# Patient Record
Sex: Female | Born: 1990
Health system: Southern US, Community
[De-identification: ages and names within clinical notes are randomized; demographics above are authoritative.]

## PROBLEM LIST (undated history)

## (undated) DIAGNOSIS — R569 Unspecified convulsions: Secondary | ICD-10-CM

## (undated) DIAGNOSIS — T7840XA Allergy, unspecified, initial encounter: Secondary | ICD-10-CM

## (undated) DIAGNOSIS — U071 COVID-19: Secondary | ICD-10-CM

## (undated) DIAGNOSIS — J101 Influenza due to other identified influenza virus with other respiratory manifestations: Secondary | ICD-10-CM

## (undated) DIAGNOSIS — J302 Other seasonal allergic rhinitis: Secondary | ICD-10-CM

## (undated) DIAGNOSIS — Z8619 Personal history of other infectious and parasitic diseases: Secondary | ICD-10-CM

## (undated) DIAGNOSIS — D649 Anemia, unspecified: Secondary | ICD-10-CM

## (undated) DIAGNOSIS — K599 Functional intestinal disorder, unspecified: Secondary | ICD-10-CM

## (undated) HISTORY — DX: Anemia, unspecified: D64.9

## (undated) HISTORY — DX: Personal history of other infectious and parasitic diseases: Z86.19

## (undated) HISTORY — DX: Other seasonal allergic rhinitis: J30.2

## (undated) HISTORY — DX: Allergy, unspecified, initial encounter: T78.40XA

## (undated) HISTORY — DX: Unspecified convulsions: R56.9

## (undated) HISTORY — DX: COVID-19: U07.1

## (undated) HISTORY — DX: Functional intestinal disorder, unspecified: K59.9

---
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
## 2010-03-08 ENCOUNTER — Encounter: Payer: Self-pay | Admitting: Internal Medicine

## 2010-04-07 ENCOUNTER — Encounter: Payer: Self-pay | Admitting: Internal Medicine

## 2010-05-09 ENCOUNTER — Encounter: Payer: Self-pay | Admitting: Internal Medicine

## 2010-05-12 ENCOUNTER — Encounter (INDEPENDENT_AMBULATORY_CARE_PROVIDER_SITE_OTHER): Payer: Self-pay | Admitting: *Deleted

## 2010-05-30 ENCOUNTER — Ambulatory Visit: Payer: Self-pay | Admitting: Internal Medicine

## 2010-05-30 DIAGNOSIS — K5909 Other constipation: Secondary | ICD-10-CM

## 2010-05-30 DIAGNOSIS — E559 Vitamin D deficiency, unspecified: Secondary | ICD-10-CM | POA: Insufficient documentation

## 2010-05-30 HISTORY — DX: Other constipation: K59.09

## 2010-06-22 ENCOUNTER — Encounter: Payer: Self-pay | Admitting: Internal Medicine

## 2010-07-04 ENCOUNTER — Telehealth: Payer: Self-pay | Admitting: Internal Medicine

## 2010-07-07 ENCOUNTER — Telehealth: Payer: Self-pay | Admitting: Internal Medicine

## 2010-07-25 ENCOUNTER — Telehealth: Payer: Self-pay | Admitting: Internal Medicine

## 2010-07-27 ENCOUNTER — Telehealth: Payer: Self-pay | Admitting: Internal Medicine

## 2010-08-23 ENCOUNTER — Ambulatory Visit: Payer: Self-pay | Admitting: Internal Medicine

## 2010-08-23 ENCOUNTER — Encounter (INDEPENDENT_AMBULATORY_CARE_PROVIDER_SITE_OTHER): Payer: Self-pay | Admitting: *Deleted

## 2010-08-31 ENCOUNTER — Telehealth: Payer: Self-pay | Admitting: Internal Medicine

## 2010-09-02 ENCOUNTER — Ambulatory Visit: Payer: Self-pay | Admitting: Internal Medicine

## 2010-09-02 ENCOUNTER — Encounter (INDEPENDENT_AMBULATORY_CARE_PROVIDER_SITE_OTHER): Payer: Self-pay | Admitting: *Deleted

## 2010-09-02 LAB — CONVERTED CEMR LAB: Sed Rate: 10 mm/hr (ref 0–22)

## 2010-09-26 ENCOUNTER — Ambulatory Visit: Payer: Self-pay | Admitting: Internal Medicine

## 2010-10-04 ENCOUNTER — Encounter (INDEPENDENT_AMBULATORY_CARE_PROVIDER_SITE_OTHER): Payer: Self-pay | Admitting: *Deleted

## 2010-10-10 ENCOUNTER — Telehealth: Payer: Self-pay | Admitting: Internal Medicine

## 2010-10-18 ENCOUNTER — Telehealth: Payer: Self-pay | Admitting: Internal Medicine

## 2010-10-21 ENCOUNTER — Telehealth: Payer: Self-pay | Admitting: Internal Medicine

## 2010-11-07 ENCOUNTER — Ambulatory Visit: Payer: Self-pay | Admitting: Internal Medicine

## 2010-11-07 DIAGNOSIS — R1110 Vomiting, unspecified: Secondary | ICD-10-CM | POA: Insufficient documentation

## 2010-11-10 ENCOUNTER — Telehealth: Payer: Self-pay | Admitting: Internal Medicine

## 2010-11-11 ENCOUNTER — Ambulatory Visit: Payer: Self-pay | Admitting: Internal Medicine

## 2010-11-16 ENCOUNTER — Ambulatory Visit: Payer: Self-pay | Admitting: Internal Medicine

## 2010-11-16 ENCOUNTER — Telehealth: Payer: Self-pay | Admitting: Internal Medicine

## 2010-11-22 ENCOUNTER — Encounter (INDEPENDENT_AMBULATORY_CARE_PROVIDER_SITE_OTHER): Payer: Self-pay | Admitting: *Deleted

## 2010-11-22 ENCOUNTER — Telehealth (INDEPENDENT_AMBULATORY_CARE_PROVIDER_SITE_OTHER): Payer: Self-pay | Admitting: *Deleted

## 2010-12-02 ENCOUNTER — Telehealth: Payer: Self-pay | Admitting: Internal Medicine

## 2010-12-09 ENCOUNTER — Telehealth: Payer: Self-pay | Admitting: Internal Medicine

## 2011-01-17 ENCOUNTER — Encounter: Payer: Self-pay | Admitting: Internal Medicine

## 2011-01-24 NOTE — Letter (Signed)
Summary: Appt Reminder 2  [facility]  [address] [place], [place] [address]   Phone: [phone]  Fax: [phone]        October 04, 2010 MRN: [id number]    Equilla Harton [address] [place], [place]  [address]    Dear Ms. Rozenberg,   You have a return appointment with Dr. Leone Payor on November 07, 2010 at 2:30pm.  Please remember to bring a complete list of the medicines you are taking, your insurance card and your co-pay.  If you have to cancel or reschedule this appointment, please call before 5:00 pm the evening before to avoid a cancellation fee.  If you have any questions or concerns, please call [phone].    Sincerely,    Francee Piccolo CMA Duncan Dull)  Appended Document: Appt Reminder 2 printed and mailed on 10/04/10

## 2011-01-24 NOTE — Progress Notes (Signed)
Summary: TRIAGE  Phone Note Call from Patient Call back at [phone]   Caller: Patient Call For: Dr. Leone Payor Reason for Call: Talk to Nurse Summary of Call: pt. has been taking the DICYCLOMINE and feels like she has gotten "worse", still in alot of pain Initial call taken by: Karna Christmas,  August 31, 2010 10:29 AM  Follow-up for Phone Call        Pt. states she has been taking Bentyl, now states she is worse, she feels the pain even when not eating, pain is constant now.  She is using Miralax once daily, but no improvement in constipation.  Declines to increase Miralax to two times a day.  DR.GESSNER PLEASE ADVISE   Follow-up by: Laureen Ochs LPN,  August 31, 2010 10:37 AM  Additional Follow-up for Phone Call Additional follow up Details #1::        I would like Amy to see her if possible, this week. Additional Follow-up by: Iva Boop MD, Clementeen Graham,  August 31, 2010 3:47 PM    Additional Follow-up for Phone Call Additional follow up Details #2::    Message left for patient to callback. Laureen Ochs LPN  August 31, 2010 3:56 PM  Pt. scheduled an appt. w/Amy Esterwood PAC on 09-02-10 at 11am. Pt. instructed to call back as needed.  Follow-up by: Laureen Ochs LPN,  August 31, 2010 4:14 PM

## 2011-01-24 NOTE — Assessment & Plan Note (Signed)
Summary: FOLLOW UP COLON//SP   History of Present Illness Visit Type: Follow-up Visit Primary GI MD: Stan Head MD [facility] Primary Provider: Loma Sender, MD Requesting Provider: na Chief Complaint: Dry heaves daily since Colon History of Present Illness:   20 yo with chronic constipation. she began to have dry heaves after colonoscopy - she initially thought it might be related to the Amitiza but stopping it did not help. These are random but do not disturb her sleep. She does seem to have early satiety. She notices bad breath in AM also. She canot associate them with any food or activity trigger. Bowel habits still alternating with no stools and then multiple soft stools. Still on the MiraLax. She says she began to vomt after she had taken milk of magnesia for several weeks straight.   School is going well. Has not been needed at work for past month.   GI Review of Systems    Reports acid reflux, nausea, and  vomiting.      Denies abdominal pain, belching, bloating, chest pain, dysphagia with liquids, dysphagia with solids, heartburn, loss of appetite, vomiting blood, weight loss, and  weight gain.      Reports change in bowel habits.     Denies anal fissure, black tarry stools, constipation, diarrhea, diverticulosis, fecal incontinence, heme positive stool, hemorrhoids, irritable bowel syndrome, jaundice, light color stool, liver problems, rectal bleeding, and  rectal pain.    Current Medications (verified): 1)  Birth Control Pills .... As Directed 2)  Vitamin D-3 5000 Unit Tabs (Cholecalciferol) .... Take One By Mouth Once Daily 3)  Miralax  Powd (Polyethylene Glycol 3350) .... Take One Capful Once A Day 4)  Tylenol Extra Strength 500 Mg Tabs (Acetaminophen) .... Take 1-2 Tablets Every 6-8 Hours As Needed 5)  Dulcolax 5 Mg Tbec (Bisacodyl) .... Take As Directed Every 4-5 Days If No Bowel Movement  Allergies (verified): No Known Drug Allergies  Past History:  Past Medical  History: Reviewed history from 09/02/2010 and no changes required. Chronic constipation HX VIT D DEF  Past Surgical History: Reviewed history from 05/30/2010 and no changes required. foot surgery Wisdom teeth  Family History: Reviewed history from 05/30/2010 and no changes required. Family History of Colon Cancer:PGM Family History of Heart Disease: Father  Social History: Reviewed history from 08/23/2010 and no changes required. Occupation:[employer] part-time [employer] Graduate 2011 [employer] Student at [employer] Patient has never smoked.  Alcohol Use - no Daily Caffeine Use Illicit Drug Use - no  Review of Systems       she has a headache today but no caffeine yet today.  Vital Signs:  Patient profile:   20 year old female Height:      67 inches Weight:      130.50 pounds BMI:     20.51 Pulse rate:   72 / minute Pulse rhythm:   regular BP sitting:   104 / 76  (left arm) Cuff size:   regular  Vitals Entered By: June McMurray CMA Duncan Dull) (November 07, 2010 2:37 PM)  Physical Exam  General:  Well developed, well nourished, no acute distress. Abdomen:  soft and nontender no splash BS+   Impression & Recommendations:  Problem # 1:  CONSTIPATION, CHRONIC (ICD-564.09) ? motility problems await Sitz marks  Problem # 2:  DRY HEAVES (ICD-787.03) Assessment: New cause not clear trial of PPI (Empiric) may need gastric emtying study, EGD could be behavioral though she does not appear anxious  Patient Instructions: 1)  Begin taking Dexilant once daily 30 minutes before breakfast. 2)  Please return on Friday, 11/11/10 for Sitzmarks.  You will need to return on 11/23 for your XRay. 3)  We will call you with further follow up after reviewing these results.  4)  Copy Sent To: Loma Sender, MD 5)  The medication list was reviewed and reconciled.  All changed / newly prescribed medications were explained.  A complete medication list was provided to the patient  / caregiver.

## 2011-01-24 NOTE — Letter (Signed)
Summary: New Patient letter  [facility]  [address] [place], [place] [address]   Phone: [phone]  Fax: [phone]       05/12/2010 MRN: [id number]  Caroline [name] [address] [place], [place]  [address]  Dear Ms. Shinn,  Welcome to the Gastroenterology Division at [facility].    You are scheduled to see Dr.  Stan Head on May 30, 2010 at 8:45am on the 3rd floor at [facility], [address]  We ask that you try to arrive at our office 15 minutes prior to your appointment time to allow for check-in.  We would like you to complete the enclosed self-administered evaluation form prior to your visit and bring it with you on the day of your appointment.  We will review it with you.  Also, please bring a complete list of all your medications or, if you prefer, bring the medication bottles and we will list them.  Please bring your insurance card so that we may make a copy of it.  If your insurance requires a referral to see a specialist, please bring your referral form from your primary care physician.  Co-payments are due at the time of your visit and may be paid by cash, check or credit card.     Your office visit will consist of a consult with your physician (includes a physical exam), any laboratory testing he/she may order, scheduling of any necessary diagnostic testing (e.g. x-ray, ultrasound, CT-scan), and scheduling of a procedure (e.g. Endoscopy, Colonoscopy) if required.  Please allow enough time on your schedule to allow for any/all of these possibilities.    If you cannot keep your appointment, please call [phone] to cancel or reschedule prior to your appointment date.  This allows [place] the opportunity to schedule an appointment for another patient in need of care.  If you do not cancel or reschedule by 5 p.m. the business day prior to your appointment date, you will be charged a $50.00 late cancellation/no-show fee.    Thank you for  choosing [facility] for your medical needs.  We appreciate the opportunity to care for you.  Please visit [place] at our website  to learn more about our practice.                     Sincerely,                                                             The Gastroenterology Division

## 2011-01-24 NOTE — Progress Notes (Signed)
Summary: labwork  Phone Note Call from Patient Call back at [phone]   Caller: Patient Call For: Dr. Leone Payor Reason for Call: Talk to Nurse Summary of Call: wants to know if she needs to redo bloodwork and when Initial call taken by: Vallarie Mare,  July 25, 2010 11:23 AM  Follow-up for Phone Call        no lab work recommended at this time.  Mom reports patient still having problems.  I have asked her to call back to schedule an office visit.  Follow-up by: Darcey Nora RN, CGRN,  July 25, 2010 12:05 PM

## 2011-01-24 NOTE — Progress Notes (Signed)
Summary: med ?  Phone Note Call from Patient Call back at [phone]   Caller: mother, Amil Amen Call For: Dr. Leone Payor Reason for Call: Talk to Nurse Summary of Call: med interaction ? Initial call taken by: Vallarie Mare,  November 10, 2010 2:22 PM  Follow-up for Phone Call        Patient was started on amoxicillin and wanted to know if it will interefere with the Sitzmark study she is doing.  She is advised ok shouldn't cause any problems. Follow-up by: Darcey Nora RN, CGRN,  November 10, 2010 2:33 PM

## 2011-01-24 NOTE — Progress Notes (Signed)
  Phone Note Other Incoming   Request: Send information Summary of Call: Patient's release for all medical records forwarded to [facility].

## 2011-01-24 NOTE — Letter (Signed)
Summary: [facility]  [facility]   Imported By: Sherian Rein 06/03/2010 08:03:13  _____________________________________________________________________  External Attachment:    Type:   Image     Comment:   External Document

## 2011-01-24 NOTE — Assessment & Plan Note (Signed)
Summary: hx constipation....em   History of Present Illness Visit Type: Initial Consult Primary GI MD: Stan Head MD [facility] Primary Provider: Loma Sender, MD Requesting Provider: Loma Sender, MD Chief Complaint: constipation  x 6weeks History of Present Illness:   20 yo woman with 2 year history of defecation problems. Felt like she was going to pass out because she could not defecate. She had a ? of hypoglycemia but told or determined that was a false +. Caroline [name] thought she had some panic issues, vit D was low and supplemented and that helped with sleep. Saw Caroline Plume, MD and was ok on Milk of Magnesia then changed to MiraLAx with success after vomiting with Milk of Magnesia. Then in past several weeks she noticed excessive thirst , a few days between bowel movements instead of daily. she used dulcolax intermittently with success. Gets cramps and urge to defecate but unable to pass. Mom says that she had some rectal bleeding as a child (age 51) but no constipation. She passed out as a fourth-grader, and recalls going home and defecating with relief. She feels very tired after spells of crampy, delayed defecation. Mom is concerned because her paternal grandmother died related to  colon or rectal cancer surgery at 58...no more details.   Also ? if this is worse around menses.   GI Review of Systems    Reports abdominal pain, bloating, nausea, and  vomiting.     Location of  Abdominal pain: lower abdomen.    Denies acid reflux, belching, chest pain, dysphagia with liquids, dysphagia with solids, heartburn, loss of appetite, vomiting blood, weight loss, and  weight gain.      Reports constipation.     Denies anal fissure, black tarry stools, change in bowel habit, diarrhea, diverticulosis, fecal incontinence, heme positive stool, hemorrhoids, irritable bowel syndrome, jaundice, light color stool, liver problems, rectal bleeding, and  rectal pain. Preventive  Screening-Counseling & Management  Alcohol-Tobacco     Smoking Status: never      Drug Use:  no.      Current Medications (verified): 1)  Miralax   Powd (Polyethylene Glycol 3350) .... Take One Dose Once Daily 2)  Birth Control Pills .... As Directed 3)  Vitamin D 50000iu .... 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  Alcohol Use - no Daily Caffeine Use Illicit Drug Use - no Smoking Status:  never Drug Use:  no  Review of Systems       The patient complains of allergy/sinus, fatigue, menstrual pain, and thirst - excessive.         All other ROS negative except as per HPI.   Vital Signs:  Patient profile:   20 year old female Height:      67 inches Weight:      131.25 pounds BMI:     20.63 Pulse rate:   80 / minute Pulse rhythm:   regular BP sitting:   100 / 70  (left arm) Cuff size:   regular  Vitals Entered By: Caroline [name] CMA Caroline [name]) (Caroline  6, 2011 9:11 AM)  Physical Exam  General:  Well developed, well nourished, no acute distress. Eyes:  PERRLA, no icterus. Mouth:  No deformity or lesions, dentition normal. Neck:  Supple; no masses or thyromegaly. Lungs:  Clear throughout to auscultation. Heart:  Regular rate and rhythm; no murmurs, rubs,  or bruits. Abdomen:  Soft, nontender and nondistended. No masses, hepatosplenomegaly or hernias noted. Normal bowel sounds. Rectal:  Caroline [name] CMA present normal anoderm sensation intact normal tone, good voluntary squeeze no abnormal descent Extremities:  No clubbing, cyanosis, edema or deformities noted. Neurologic:  Alert and  oriented x4;  grossly normal neurologically. Cervical Nodes:  No significant  cervical or supraclavicular adenopathy.  Psych:  Alert and cooperative. Normal mood and affect.   Impression & Recommendations:  Problem # 1:  CONSTIPATION (ICD-564.00) Assessment New nothing suspicious but ? sprue  IBS likely sounds like vasovagal issues and not panic attacks re: presyncope sensations while defecating but she may have some anxiety with overall situation Will increase to  two times a day MiraLax and as needed dulcolax may need sitz marker study but not a good time to stop laxatives as preparing to graduate from high school will review Caroline [name] records before any other recommendations, he might have drawn celiac labs  Problem # 2:  VITAMIN D DEFICIENCY (ICD-268.9) Assessment: New The presence of this raises possibility of celiac disease. Await old records review.  Patient Instructions: 1)  We will review Caroline [name] records and call. 2)  Ifyou have not heard from [place] in 2 weeks call back. 3)  It sounds like the near-fainting is related to vasovagal reaction or Valsalva reflex. 4)  Increase your Miralax to two times a day. 5)  You may use Dulcolax every 2-3 days as needed. 6)  Copy sent to : Caroline Marry, MD, Caroline Sender, MD 7)  The medication list was reviewed and reconciled.  All changed / newly prescribed medications were explained.  A complete medication list was provided to the patient / caregiver.  Appended Document: hx constipation....em old records from Dr. Chestine Spore and Dr. Vear Clock' office notes and recent TSH, vit D, Hgb and Hct viewed  Appended Document: hx constipation....em   Appended Document: hx constipation....em I have reviewed Dr. Altheimer's records. She has not been tested for eliac disease we will order TTG Ab and IgA level  Appended Document: hx constipation....em Patient  wants the labs to be drawn at [facility].  I will fax an order Caroline [name], [facility]  Caroline 14, 2011 8:52 AM   Clinical Lists Changes  Orders: Added  new Test order of T-Tissue Transglutamase Ab IgA [id number]) - Signed Added new Test order of TLB-IgA (Immunoglobulin A) ([id number]) - Signed

## 2011-01-24 NOTE — Progress Notes (Signed)
Summary: update request  Phone Note Call from Patient Call back at Home Phone [phone] Call back at [phone]   Caller: Patient Call For: Dr. Leone Payor Reason for Call: Talk to Nurse Summary of Call: would like to know the status of her referral by Dr. Leone Payor to another specialist Initial call taken by: Vallarie Mare,  December 02, 2010 3:24 PM  Follow-up for Phone Call        advised pt that [facility] has her records and has been trying to reach her to schedule.  Per operator at [facility], pt's phone rings without answer.  Scheduling # given to pt and she may call them directly. [phone] option 2 Follow-up by: Francee Piccolo CMA Duncan Dull),  December 02, 2010 3:40 PM

## 2011-01-24 NOTE — Letter (Signed)
Summary: Casimiro Needle Altheimer MD  Casimiro Needle Altheimer MD   Imported By: Sherian Rein 06/08/2010 13:35:21  _____________________________________________________________________  External Attachment:    Type:   Image     Comment:   External Document

## 2011-01-24 NOTE — Assessment & Plan Note (Signed)
Summary: F/U FROM TRIAGE 08-31-10, CONTINUES W/PAIN  (DR.GESSNER PT.) DE...   History of Present Illness Visit Type: Follow-up Visit Primary GI MD: Stan Head MD [facility] Primary Provider: Loma Sender, MD Requesting Provider: na Chief Complaint: Patient still having abdominal pain, worse with Dicyclomine - stopped taking the pill; constipation a little better History of Present Illness:   20 YO FEMALE  KNOWN TO DR. Leone Payor. SHE IS AWAITING COLONOSCOPY WHICH IS SCHEDULED FOR THE FIRST WEEK OF OCTOBER. SHE HAS HAD C/O ABDOMINAL PAIN SINCE HER JUNIOR YEAR IN HIGH SCHOOL WHEN SHE STARTED HAVINGTROUBLE WITH CONSTIPATION AND INTERMITTENT MID ABDOMINAL DISCOMFORT. SHE WAS STARTED ON MIRALAX  BY A PEDIATRIC GASTROENTEROLOGIST AND THIS DID HELP FOR ABOUT A YEAR. NOW OVER THE PAST 3-4 MONTHS HAS HAD DAILY PAIN,CRAMPING. NO BLOATING, VAGUE NAUSEA, NO VOMITING,WEIGHT STABLE OVER ALL.SHE STRUUGLES WITH PAIN,WORSENED BY ETING, AND UNCOMFORTABLE UNTIL SHE CAN HAVE A BM THEN FEELS BETTER FOR A DAY OR TWO. NO BLOOD. DESPITE TAKING MIRIALAX DAILY SHE IS HAVING A BM Q 2-3 DAYS AND NOT EVACUATING WELL.  SHE TAKES DULCOLAX EVERY 5-6 DAYS, HAS SEVERLAL BMS' AND FEELS BETTER. TWICE DAILY MIRALAX GIVES HER LOOSE URGENT STOOL.  SHE FEELS BEST THE WEEK OF HER PERIOD,LESS PAIN, AND REGULAR DAILY BM'S.   GI Review of Systems    Reports abdominal pain, nausea, and  vomiting.     Location of  Abdominal pain: lower abdomen.    Denies acid reflux, belching, bloating, chest pain, dysphagia with liquids, dysphagia with solids, heartburn, loss of appetite, vomiting blood, weight loss, and  weight gain.      Reports constipation and  diarrhea.     Denies anal fissure, black tarry stools, change in bowel habit, diverticulosis, fecal incontinence, heme positive stool, hemorrhoids, irritable bowel syndrome, jaundice, light color stool, liver problems, rectal bleeding, and  rectal pain.    Current Medications (verified): 1)  Birth  Control Pills .... As Directed 2)  Vitamin D-3 5000 Unit Tabs (Cholecalciferol) .... Take One By Mouth Once Daily 3)  Miralax  Powd (Polyethylene Glycol 3350) .... Take One Capful Once A Day  Allergies (verified): No Known Drug Allergies  Past History:  Past Medical History: Chronic constipation HX VIT D DEF  Past Surgical History: Reviewed history from 05/30/2010 and no changes required. foot surgery Wisdom teeth  Family History: Reviewed history from 05/30/2010 and no changes required. Family History of Colon Cancer:PGM Family History of Heart Disease: Father  Social History: Reviewed history from 08/23/2010 and no changes required. 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  Alcohol Use - no Daily Caffeine Use Illicit Drug Use - no  Review of Systems  The patient denies allergy/sinus, anemia, anxiety-new, arthritis/joint pain, back pain, blood in urine, breast changes/lumps, change in vision, confusion, cough, coughing up blood, depression-new, fainting, fatigue, fever, headaches-new, hearing problems, heart murmur, heart rhythm changes, itching, menstrual pain, muscle pains/cramps, night sweats, nosebleeds, pregnancy symptoms, shortness of breath, skin rash, sleeping problems, sore throat, swelling of feet/legs, swollen lymph glands, thirst - excessive , urination - excessive , urination changes/pain, urine leakage, vision changes, and voice change.         SEE HPI  Vital Signs:  Patient profile:   20 year old female Height:      67 inches Weight:      132.25 pounds BMI:     20.79 Pulse rate:   68 / minute Pulse rhythm:   regular BP  sitting:   104 / 68  (left arm) Cuff size:   regular  Vitals Entered By: June McMurray CMA Duncan Dull) (September 02, 2010 10:45 AM)  Physical Exam  General:  Well developed, well nourished, no acute distress.,TALL ,THIN Head:  Normocephalic and atraumatic. Eyes:   PERRLA, no icterus. Lungs:  Clear throughout to auscultation. Heart:  Regular rate and rhythm; no murmurs, rubs,  or bruits. Abdomen:  SOFT, NO FOCAL TENDERNESS, NO MASS OR HSM,BS+ Rectal:  NOT DONE Extremities:  No clubbing, cyanosis, edema or deformities noted. Neurologic:  Alert and  oriented x4;  grossly normal neurologically. Psych:  Alert and cooperative. Normal mood and affect.anxious.  anxious.     Impression & Recommendations:  Problem # 1:  ABDOMINAL PAIN, PERIUMBILICAL (ICD-789.05) Assessment Unchanged 20 YO FEMAL WITH 2 YEAR HX OF MID ABDOMINAL PAIN, CONSTIPATION;PROGRESSIVE SXS PAST 3-4 MONTHS. R/O IBS VS IBD. TTG PREVIOUSLY NEGATIVE.    LONG DISCUSSION REGARDING POSSIBLE IBS/IBD OFFERED TO MOVE COLONOSCOPY TO A SOONER DATE-SHE IS UNABLE DUE TO SCHOOL SCHEDULE CONTINUE MIRALAX 17 GM DAILY IN 8 OZ WATER BENTYL NOT HELPFUL-WILL USE as needed TYLENOL FOR PAIN CONTINUE DULCOLAX Q 5-6 DAYS AS NEEDED. CRP,ESR ADD ALIGN ONE DAILY X 4-6 WEEKS. SHE IS COMFORTABLE WITH THIS PLAN. Orders: TLB-CRP-High Sensitivity (C-Reactive Protein) ([id number]) TLB-Sedimentation Rate (ESR) ([id number])  Problem # 2:  VITAMIN D DEFICIENCY (ICD-268.9) Assessment: Comment Only  Patient Instructions: 1)  Please go to the basement to have your lab tests drawn today.  2)  We will call you with further follow up after reviewing these results. 3)  Begin Dulcolax, Align and Tylenol as prescribed below. 4)  We will see you at your procedure on 09/13/10. 5)  Please call our office before then if needed. 6)  Copy sent to : Loma Sender, MD 7)  The medication list was reviewed and reconciled.  All changed / newly prescribed medications were explained.  A complete medication list was provided to the patient / caregiver.

## 2011-01-24 NOTE — Progress Notes (Signed)
Summary: Sch'd procedure  Phone Note Call from Patient Call back at Home Phone [phone]   Caller: Patient Call For: Dr. Leone Payor Reason for Call: Talk to Nurse Summary of Call: pt. wants to sch'd a capsule endo Initial call taken by: Karna Christmas,  October 21, 2010 1:51 PM  Follow-up for Phone Call        Pt would like to proceed with Sitzmarks study.  She will come on 11/18 to take capsule and return on 11/23 for xray.  Appt's in IDX. Follow-up by: Francee Piccolo CMA Duncan Dull),  October 21, 2010 4:53 PM     Appended Document: Orders Update    Clinical Lists Changes  Orders: Added new Test order of KUB for SITZ Marks (KUB for SITZ) - Signed

## 2011-01-24 NOTE — Letter (Signed)
Summary: Out of [facility]  [address] [place], [place] [address]   Phone: [phone]  Fax: [phone]    September 02, 2010   Student:  Patrena Creekmore    To Whom It May Concern:   For Medical reasons, please excuse the above named student from school for the following dates:  Start:   September 02, 2010  End:    September 02, 2010  If you need additional information, please feel free to contact our office.   Sincerely,    Francee Piccolo CMA [facility])    ****This is a legal document and cannot be tampered with.  Schools are authorized to verify all information and to do so accordingly.

## 2011-01-24 NOTE — Letter (Signed)
Summary: Casimiro Needle Altheimer MD  Casimiro Needle Altheimer MD   Imported By: Sherian Rein 06/08/2010 13:36:44  _____________________________________________________________________  External Attachment:    Type:   Image     Comment:   External Document

## 2011-01-24 NOTE — Procedures (Signed)
Summary: Colonoscopy  Patient: Caroline [name] Note: All result statuses are Final unless otherwise noted.  Tests: (1) Colonoscopy (COL)   COL Colonoscopy           DONE     [facility]     [address]     [place], [place]  [address]           COLONOSCOPY PROCEDURE REPORT           PATIENT:  [name], [name]  MR#:  [id number]     BIRTHDATE:  1991/09/30, 18 yrs. old  GENDER:  female     ENDOSCOPIST:  Iva Boop, MD, [facility]           PROCEDURE DATE:  09/26/2010     PROCEDURE:  Colonoscopy 19147     ASA CLASS:  Class I     INDICATIONS:  Abdominal pain, constipation periumbilical abdominal     pain and other sites, with ? RLQ fullness on one exam, colonoscopy     to evaluate this and look for possible Crohn's disease     MEDICATIONS:   Fentanyl 75 mcg IV, Versed 9 mg IV           DESCRIPTION OF PROCEDURE:   After the risks benefits and     alternatives of the procedure were thoroughly explained, informed     consent was obtained.  Digital rectal exam was performed and     revealed no abnormalities.   The  endoscope was introduced through     the anus and advanced to the cecum, which was identified by both     the appendix and ileocecal valve, without limitations.  The     quality of the prep was excellent, using MoviPrep.  The instrument     was then slowly withdrawn as the colon was fully examined.     Insertion: 3:34 minutes Withdrawal: 9:11 minutes     <<PROCEDUREIMAGES>>           FINDINGS:  The terminal ileum appeared normal.  A normal appearing     cecum, ileocecal valve, and appendiceal orifice were identified.     The ascending, hepatic flexure, transverse, splenic flexure,     descending, sigmoid colon, and rectum appeared unremarkable.     Retroflexed views in the rectum revealed no abnormalities.    The     scope was then withdrawn from the patient and the procedure     completed.           COMPLICATIONS:  None     ENDOSCOPIC IMPRESSION:     1) Normal  terminal ileum     2) Normal colon           All available data and findings to date support a diagnosis of     Irritable Bowel Syndrome.     RECOMMENDATIONS:     1) Continue MiraLax qd     2) Start Amitiza 8 mcg 2 times a day, take with food. If this     works, let us know and will prescribe. If too many bowel movements     on both MiraLax and Amitiza then stop the MiraLax but continue     Amitiza.     3) Patient to call Dr. Marvell Fuller office (this week) to arrange     an appointment for early November           Iva Boop, MD, [facility]  CC:  Loma Sender, MD     The Patient           n.     eSIGNED:   Iva Boop at 09/26/2010 03:02 PM           [name], [id number]  Note: An exclamation mark (!) indicates a result that was not dispersed into the flowsheet. Document Creation Date: 09/26/2010 3:02 PM _______________________________________________________________________  (1) Order result status: Final Collection or observation date-time: 09/26/2010 14:47 Requested date-time:  Receipt date-time:  Reported date-time:  Referring Physician:   Ordering Physician: Stan Head [phone]) Specimen Source:  Source: Launa Grill Order Number: [id number] Lab site:

## 2011-01-24 NOTE — Progress Notes (Signed)
Summary: Did we receive labwork yet?  Phone Note Call from Patient Call back at [phone]   Call For: Dr Leone Payor Summary of Call: Wonders if we received labwork yet? Initial call taken by: Leanor Kail [facility],  July 07, 2010 10:13 AM  Follow-up for Phone Call        lab work is here, I have left her a message. Follow-up by: Darcey Nora RN, CGRN,  July 07, 2010 10:20 AM

## 2011-01-24 NOTE — Letter (Signed)
Summary: Casimiro Needle Altheimer MD  Casimiro Needle Altheimer MD   Imported By: Sherian Rein 06/08/2010 13:37:41  _____________________________________________________________________  External Attachment:    Type:   Image     Comment:   External Document

## 2011-01-24 NOTE — Progress Notes (Signed)
Summary: Wants to stop Althia Forts  Phone Note Call from Patient Call back at [employer] [phone]   Call For: DR Leone Payor Summary of Call: Amitiza- wants to stop taking medicine Initial call taken by: Leanor Kail [facility],  October 18, 2010 12:42 PM  Follow-up for Phone Call        Patient has nausea and dry heaves with Amitiza and she doesn't feel it has made any difference.  Even with her insurance is very expensive.  She is asking for an alternative. Advised not many other options.  She is asked to continue Miralax until Dr Leone Payor has a chance to review. Follow-up by: Darcey Nora RN, CGRN,  October 18, 2010 1:28 PM  Additional Follow-up for Phone Call Additional follow up Details #1::        ok, I agree MiraLax two times a day  dulcolax 1-2 by mouth every 2-3 days as needed to promote a bowel movement if has not had one when she can take a break from all laxatives for 5 days i want her to do a sitz marks study Additional Follow-up by: Iva Boop MD, [facility],  October 18, 2010 2:11 PM    Additional Follow-up for Phone Call Additional follow up Details #2::    phone is busy I will continue to try and reach the patient  Darcey Nora RN, [facility]  October 18, 2010 2:37 PM  phone is still busy I will continue to try and reach the patient  Darcey Nora RN, [facility]  October 18, 2010 4:00 PM  patient notified of Dr Marvell Fuller recommendations, she will call back when she is ready to do SitzMark study. Follow-up by: Darcey Nora RN, CGRN,  October 18, 2010 4:16 PM

## 2011-01-24 NOTE — Progress Notes (Signed)
Summary: Triage  Phone Note Call from Patient Call back at Home Phone [phone]   Caller: Patient Call For: Dr. Leone Payor Reason for Call: Talk to Nurse Summary of Call: pt. is still having problems...wants to sch'd an ultrasound Initial call taken by: Karna Christmas,  July 27, 2010 1:21 PM  Follow-up for Phone Call        patient is scheduled to see Dr Leone Payor for 08/23/10 3:30 Follow-up by: Darcey Nora RN, CGRN,  July 27, 2010 1:50 PM

## 2011-01-24 NOTE — Letter (Signed)
Summary: Out of School-11/22/10  [facility]  [address] [place], [place] [address]   Phone: [phone]  Fax: [phone]    November 22, 2010   Student:    Caroline [name] DOB:    [birth year]   To Whom It May Concern:   For Medical reasons, please excuse the above named student from school for the following dates:  Start:   November 22, 2010  End:    November 22, 2010  If you need additional information, please feel free to contact our office.   Sincerely,    Francee Piccolo CMA [facility])    ****This is a legal document and cannot be tampered with.  Schools are authorized to verify all information and to do so accordingly.

## 2011-01-24 NOTE — Letter (Signed)
Summary: [facility]  [facility]   Imported By: Sherian Rein 06/03/2010 07:58:49  _____________________________________________________________________  External Attachment:    Type:   Image     Comment:   External Document

## 2011-01-24 NOTE — Progress Notes (Signed)
Summary: TTG ab neg  Phone Note Outgoing Call   Summary of Call: let her know the tTG Ab is neg for celiac disease  how is she? Iva Boop MD, [facility]  July 07, 2010 8:19 PM   Follow-up for Phone Call        Patient  denies any GI complaint.  "I'm doing pretty good" Follow-up by: Darcey Nora RN, CGRN,  July 08, 2010 10:13 AM  Additional Follow-up for Phone Call Additional follow up Details #1::        okthen continue with current therapy and see me as needed

## 2011-01-24 NOTE — Progress Notes (Signed)
Summary: Constipation/Nausea  Phone Note Call from Patient Call back at Home Phone [phone]   Reason for Call: Talk to Nurse Summary of Call: Pt states she has not noticed any difference while on Amitiza.  She continues Miralax also.  Pt staets she had a scant BM yesterday and nothing today.  Abdomen feels full.  She is now having nausea with dry heaves, but no vomiting.  Last dose of Miralax was last night.  Pt wants to know if there is anything else for constipation.     Initial call taken by: Francee Piccolo CMA [facility]),  October 10, 2010 10:49 AM  Follow-up for Phone Call        need to go to higher dose Amitiza 24 micrograms two times a day with food (Rx written), also use MiraLax REV 4-6 weeks approx may call back after 2-3 weeks if no better Follow-up by: Iva Boop MD, [facility],  October 10, 2010 2:36 PM  Additional Follow-up for Phone Call Additional follow up Details #1::        Pt is notified of the above.  She is agreeable.  She will keep appt on 11/14.  Amitiza sent to [facility]. [place] [employer]. Additional Follow-up by: Francee Piccolo CMA [facility]),  October 10, 2010 3:48 PM    New/Updated Medications: AMITIZA 24 MCG  CAPS (LUBIPROSTONE) 1 two times a day/take with food and water Prescriptions: AMITIZA 24 MCG  CAPS (LUBIPROSTONE) 1 two times a day/take with food and water  #60 x 1   Entered by:   Francee Piccolo CMA (AAMA)   Authorized by:   Iva Boop MD, [facility]   Signed by:   Francee Piccolo CMA (AAMA) on 10/10/2010   Method used:   Faxed to ...       [facility] (retail)       [address]       [place], [place]    [place]       Ph: [phone]       Fax: [phone]   RxID:   [id number] AMITIZA 24 MCG  CAPS (LUBIPROSTONE) 1 two times a day/take with food and water  #60 x 1   Entered and Authorized by:   Iva Boop MD, [facility]   Signed by:   Iva Boop MD, FACG on 10/10/2010   Method used:   Electronically to   [facility] [address] (retail)       [address]       [place], [place]  [address]       Ph: [phone]       Fax: [phone]   RxID:   [id number]

## 2011-01-24 NOTE — Letter (Signed)
Summary: [facility] Instructions  [facility]  [address] [place], [place] [address]   Phone: [phone]  Fax: [phone]       Caroline [name]    11/23/1991    MRN: [id number]      Procedure Day Dorna Bloom: Jake Shark, 09/13/10     Arrival Time: 3:00 PM      Procedure Time: 4:00 PM    Location of Procedure:                    _X_  [facility] (4th Floor)  PREPARATION FOR COLONOSCOPY WITH MOVIPREP   Starting 5 days prior to your procedure 09/08/10 do not eat nuts, seeds, popcorn, corn, beans, peas,  salads, or any raw vegetables.  Do not take any fiber supplements (e.g. Metamucil, Citrucel, and Benefiber).  THE DAY BEFORE YOUR PROCEDURE         MONDAY, 09/12/10  1.  Drink clear liquids the entire day-NO SOLID FOOD  2.  Do not drink anything colored red or purple.  Avoid juices with pulp.  No orange juice.  3.  Drink at least 64 oz. (8 glasses) of fluid/clear liquids during the day to prevent dehydration and help the prep work efficiently.  CLEAR LIQUIDS INCLUDE: Water Jello Ice Popsicles Tea (sugar ok, no milk/cream) Powdered fruit flavored drinks Coffee (sugar ok, no milk/cream) Gatorade Juice: apple, white grape, white cranberry  Lemonade Clear bullion, consomm, broth Carbonated beverages (any kind) Strained chicken noodle soup Hard Candy                           4.  In the morning, mix first dose of MoviPrep solution:    Empty 1 Pouch A and 1 Pouch B into the disposable container    Add lukewarm drinking water to the top line of the container. Mix to dissolve    Refrigerate (mixed solution should be used within 24 hrs)  5.  Begin drinking the prep at 5:00 p.m. The MoviPrep container is divided by 4 marks.   Every 15 minutes drink the solution down to the next mark (approximately 8 oz) until the full liter is complete.   6.  Follow completed prep with 16 oz of clear liquid of your choice (Nothing red or purple).  Continue to drink clear  liquids until bedtime.  7.  Before going to bed, mix second dose of MoviPrep solution:    Empty 1 Pouch A and 1 Pouch B into the disposable container    Add lukewarm drinking water to the top line of the container. Mix to dissolve    Refrigerate  THE DAY OF YOUR PROCEDURE      TUESDAY, 09/13/10  Beginning at 11:OO a.m. (5 hours before procedure):         1. Every 15 minutes, drink the solution down to the next mark (approx 8 oz) until the full liter is complete.  2. Follow completed prep with 16 oz. of clear liquid of your choice.    3. You may drink clear liquids until 2:00 PM (2 HOURS BEFORE PROCEDURE).  MEDICATION INSTRUCTIONS  Unless otherwise instructed, you should take regular prescription medications with a small sip of water   as early as possible the morning of your procedure.       OTHER INSTRUCTIONS  You will need a responsible adult at least 20 years of age to accompany you and drive you home.   This  person must remain in the waiting room during your procedure.  Wear loose fitting clothing that is easily removed.  Leave jewelry and other valuables at home.  However, you may wish to bring a book to read or  an iPod/MP3 player to listen to music as you wait for your procedure to start.  Remove all body piercing jewelry and leave at home.  Total time from sign-in until discharge is approximately 2-3 hours.  You should go home directly after your procedure and rest.  You can resume normal activities the  day after your procedure.  The day of your procedure you should not:   Drive   Make legal decisions   Operate machinery   Drink alcohol   Return to work  You will receive specific instructions about eating, activities and medications before you leave.    The above instructions have been reviewed and explained to me by   _______________________    I fully understand and can verbalize these instructions _____________________________ Date  _________

## 2011-01-24 NOTE — Assessment & Plan Note (Signed)
Summary: follow up constipation/sheri   History of Present Illness Visit Type: Follow-up Visit Primary GI MD: Stan Head MD [facility] Primary Provider: Loma Sender, MD Requesting Provider: na Chief Complaint: Follow up constipation History of Present Illness:   Patient complains her constipation has been worse since her last office visit. She is taking her Miralax once a day since twice a day was too much. She complains when she eats her abdominal pain gets worse. Her abdominal pain is not allowing her tpo sleep at night.Her abdominal generalized abdominal pain, she is worried about her family history of colon cancer. If she is to continue Miralax she would like an rx so she can get it either local or mail order.   When she eats she gets abdomnal pain in the periubilical area. This began first week of August.  She used dulcolax to "clean out" and she had pst-prandial cramps for a week and was miserable. 17 bowel movents in 2 dyas.MiraLax promotes defecation with loose stools. Defecation is incomplete. She vomited last week aso.   GI Review of Systems    Reports abdominal pain.     Location of  Abdominal pain: generalized.    Denies acid reflux, belching, bloating, chest pain, dysphagia with liquids, dysphagia with solids, heartburn, loss of appetite, nausea, vomiting, vomiting blood, weight loss, and  weight gain.      Reports constipation.     Denies anal fissure, black tarry stools, change in bowel habit, diarrhea, diverticulosis, fecal incontinence, heme positive stool, hemorrhoids, irritable bowel syndrome, jaundice, light color stool, liver problems, rectal bleeding, and  rectal pain.    Current Medications (verified): 1)  Birth Control Pills .... As Directed 2)  Vitamin D-3 5000 Unit Tabs (Cholecalciferol) .... Take One By Mouth Once Daily 3)  Miralax  Powd (Polyethylene Glycol 3350) .... Take One Capful Once A Day  Allergies (verified): No Known Drug Allergies  Past  History:  Past Medical History: Reviewed history from 05/27/2010 and no changes required. Chronic constipation  Past Surgical History: Reviewed history from 05/30/2010 and no changes required. foot surgery Wisdom teeth  Family History: Reviewed history from 05/30/2010 and no changes required. 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  Alcohol Use - no Daily Caffeine Use Illicit Drug Use - no  Review of Systems       no fevers  Vital Signs:  Patient profile:   20 year old female Height:      67 inches Weight:      132.2 pounds BMI:     20.78 Pulse rate:   80 / minute Pulse rhythm:   regular BP sitting:   108 / 66  (left arm) Cuff size:   regular  Vitals Entered By: Harlow Mares CMA [facility]) (August 23, 2010 3:10 PM)  Physical Exam  General:  Well developed, well nourished, no acute distress. Eyes:  anicteric Abdomen:  Soft, nontender and nondistended. No masses, hepatosplenomegaly or hernias noted. Normal bowel sounds.   Impression & Recommendations:  Problem # 1:  ABDOMINAL PAIN, PERIUMBILICAL (ICD-789.05) Assessment New She is quite distressed about this pain on either side of the umbilicus. To date, objective data ok. ? if she has atypical Crohn's There is a component of anxiety as symptoms are causing distress and difficulty with classes as she begins college. Add dicyclomine. "Another medicine" was response to that idea and  I explained that she is c/o pain so trying to relieve it. Alot of sighing during visit. Orders: Colonoscopy (Colon)  Problem # 2:  ABDOMINAL MASS, RIGHT LOWER QUADRANT (ICD-789.33) fullness in RLQ raises ? of Crohn's await colonoscopy could need other imaging reassess abd exam after colon prep  Problem # 3:  CONSTIPATION (ICD-564.00) Assessment: Deteriorated Hard  for me to tell from her as to how bad it is now. Getting loose stools with MiraLax so moving bowels. Significant incomplete defecation. IBS likely but given overall situation will exclude (or include) other problems with colonoscopy  Orders: Colonoscopy (Colon)  Patient Instructions: 1)  Please pick up your medications at your pharmacy.  2)  We will see you at your procedure on 09/13/10. 3)  [facility] Patient Information Guide given to patient.  4)  Colonoscopy and Flexible Sigmoidoscopy brochure given.  5)  Copy sent to : Loma Sender, MD 6)  The medication list was reviewed and reconciled.  All changed / newly prescribed medications were explained.  A complete medication list was provided to the patient / caregiver. Prescriptions: MOVIPREP 100 GM  SOLR (PEG-KCL-NACL-NASULF-NA ASC-C) As per prep instructions.  #1 x 0   Entered by:   Francee Piccolo CMA (AAMA)   Authorized by:   Iva Boop MD, [facility]   Signed by:   Francee Piccolo CMA (AAMA) on 08/23/2010   Method used:   Electronically to        [facility] [address] [phone]* (retail)       [address] [place], [place]  [address]       Ph: [phone]       Fax: [phone]   RxID:   [id number] DICYCLOMINE HCL 10 MG CAPS (DICYCLOMINE HCL) 1-2 by mouth before meals as needed to prevent abdominal cramps  #90 x 0   Entered and Authorized by:   Iva Boop MD, [facility]   Signed by:   Iva Boop MD, [facility] on 08/23/2010   Method used:   Electronically to        [facility] [address] [phone]* (retail)       [address] [place]  [address]       Ph: [phone]       Fax: [phone]   RxID:   [id number]

## 2011-01-24 NOTE — Letter (Signed)
Summary: [facility]  [facility]   Imported By: Sherian Rein 06/03/2010 08:00:04  _____________________________________________________________________  External Attachment:    Type:   Image     Comment:   External Document

## 2011-01-24 NOTE — Progress Notes (Signed)
Summary: sitz marks study ok  Phone Note Outgoing Call   Summary of Call: Let her know sitzmarks have all passed through colon this telss me that colon motility is ok IBS is dx resume MiraLax but take twice a day to see if that promotes more regular defecation are dry heaves better? Iva Boop MD, [facility]  November 16, 2010 5:22 PM   Follow-up for Phone Call        Advised pt that all sitzmarks passed and that motility is OK.  Advised pt that dx is IBS.  Pt is very angry that IBS is diagnosis and states in a whisper that I could hear to someone in the room with her that "he says I have IBS, but I know that is not right." Dalilah states that her dry heaving stopped while she was off the Miralax, but dry heaving started again when she restarted Miralax at one dose daily last Wednesday. Pt states she is dry heaving and in pain.  I advised pt to go ahead and take both Miralax doses today and I would call her with your recommendations on Tuesday.  Pt voices understanding and is agreeable. Follow-up by: Francee Piccolo CMA [facility]),  November 21, 2010 4:46 PM  Additional Follow-up for Phone Call Additional follow up Details #1::        we can refer her for another opion at [facility] if she likes. It is possible that there is more testing to do and I am out of suggestions at this time. Additional Follow-up by: Iva Boop MD, [facility],  November 22, 2010 6:45 AM    Additional Follow-up for Phone Call Additional follow up Details #2::    I notified pt of above and she is agreeable to referral to [facility].  Pt also requested a note for school today as she had one of her episodes again this AM and missed her Chemistry exam.  School note printed and left at front desk. Dr. Leone Payor, who do you prefer this pt to see at [facility]? Francee Piccolo CMA [facility])  November 22, 2010 1:51 PM  Dr. Dorita Fray to evaluate chronic constipation and suspected IBS Iva Boop MD, [facility]  November 22, 2010  4:38 PM  Referral form completed and notes faxed to [facility].  Follow-up by: Francee Piccolo CMA [facility]),  November 23, 2010 9:35 AM

## 2011-01-24 NOTE — Progress Notes (Signed)
Summary: Lab results  Phone Note Call from Patient Call back at [facility] [phone]   Caller: Patient Call For: Dr. Leone Payor Reason for Call: Talk to Doctor Summary of Call: Would like Lab results Initial call taken by: Karna Christmas,  July 04, 2010 3:49 PM  Follow-up for Phone Call        Patient  advised that we have no lab results. Follow-up by: Darcey Nora RN, CGRN,  July 04, 2010 4:13 PM

## 2011-01-26 NOTE — Progress Notes (Signed)
Summary: Triage  Phone Note Call from Patient Call back at Home Phone [phone] Call back at [phone]   Caller: Patient Call For: Dr. Leone Payor Reason for Call: Talk to Nurse Summary of Call: Pt is having problems for the past two days, bad taste in her mouth that nothing covers up Initial call taken by: [place] Johnson,  December 09, 2010 11:24 AM  Follow-up for Phone Call        Phoned patient who stated she's  had a foul taste in her mouth since Sunday; frequent oral care has not helped. Patient also reported 6 stools on Tuesday and numbers 4-6 stained the toilet tissue w/ blood; patient denies any blood since Tuesday. Patient also reported abdominal pain when she eats and she did not start Dexilant asking if that was the " drug that causes strokes? ". Patient reports her appointment in [facility] is not until 01/17/11 because she couldn't make the 01/03/11 appointment d/t school. Patient wanted to know if an antibiotic would help and was told no. Instructed patient to try an OTC PPI like Prilosec and keep her appointment at [facility]. I informed her i will send this note to Dr Leone Payor and if he has anything to offer, I will call her back. Follow-up by: Graciella Freer RN,  December 09, 2010 2:20 PM  Additional Follow-up for Phone Call Additional follow up Details #1::        Lisia has not responded to or failed to [facility] with multiple treatment regimens. I do not have any other recommendations at this time as I think she is having IBS problems with anorectal bleeding. I await the [facility] opinion at this time. and agree with above. Additional Follow-up by: Iva Boop MD, Clementeen Graham,  December 09, 2010 2:23 PM

## 2011-02-21 NOTE — Consult Note (Signed)
Summary: [facility]   Imported By: Lennie Odor 02/14/2011 12:17:31  _____________________________________________________________________  External Attachment:    Type:   Image     Comment:   External Document

## 2011-03-31 LAB — URINALYSIS, ROUTINE W REFLEX MICROSCOPIC
Bilirubin Urine: NEGATIVE
Glucose, UA: NEGATIVE mg/dL
Hgb urine dipstick: NEGATIVE
Ketones, ur: NEGATIVE mg/dL
Protein, ur: NEGATIVE mg/dL
Urobilinogen, UA: 0.2 mg/dL (ref 0.0–1.0)

## 2011-03-31 LAB — URINE CULTURE: Colony Count: >=100000

## 2012-07-07 IMAGING — CR DG ABDOMEN 1V
1 series · 1 of 1 positions shown · non-contrast
Comparison: 09/03/2009

CLINICAL DATA: KUB for sitz marker

ABDOMEN - 1 VIEW

[view not recorded]
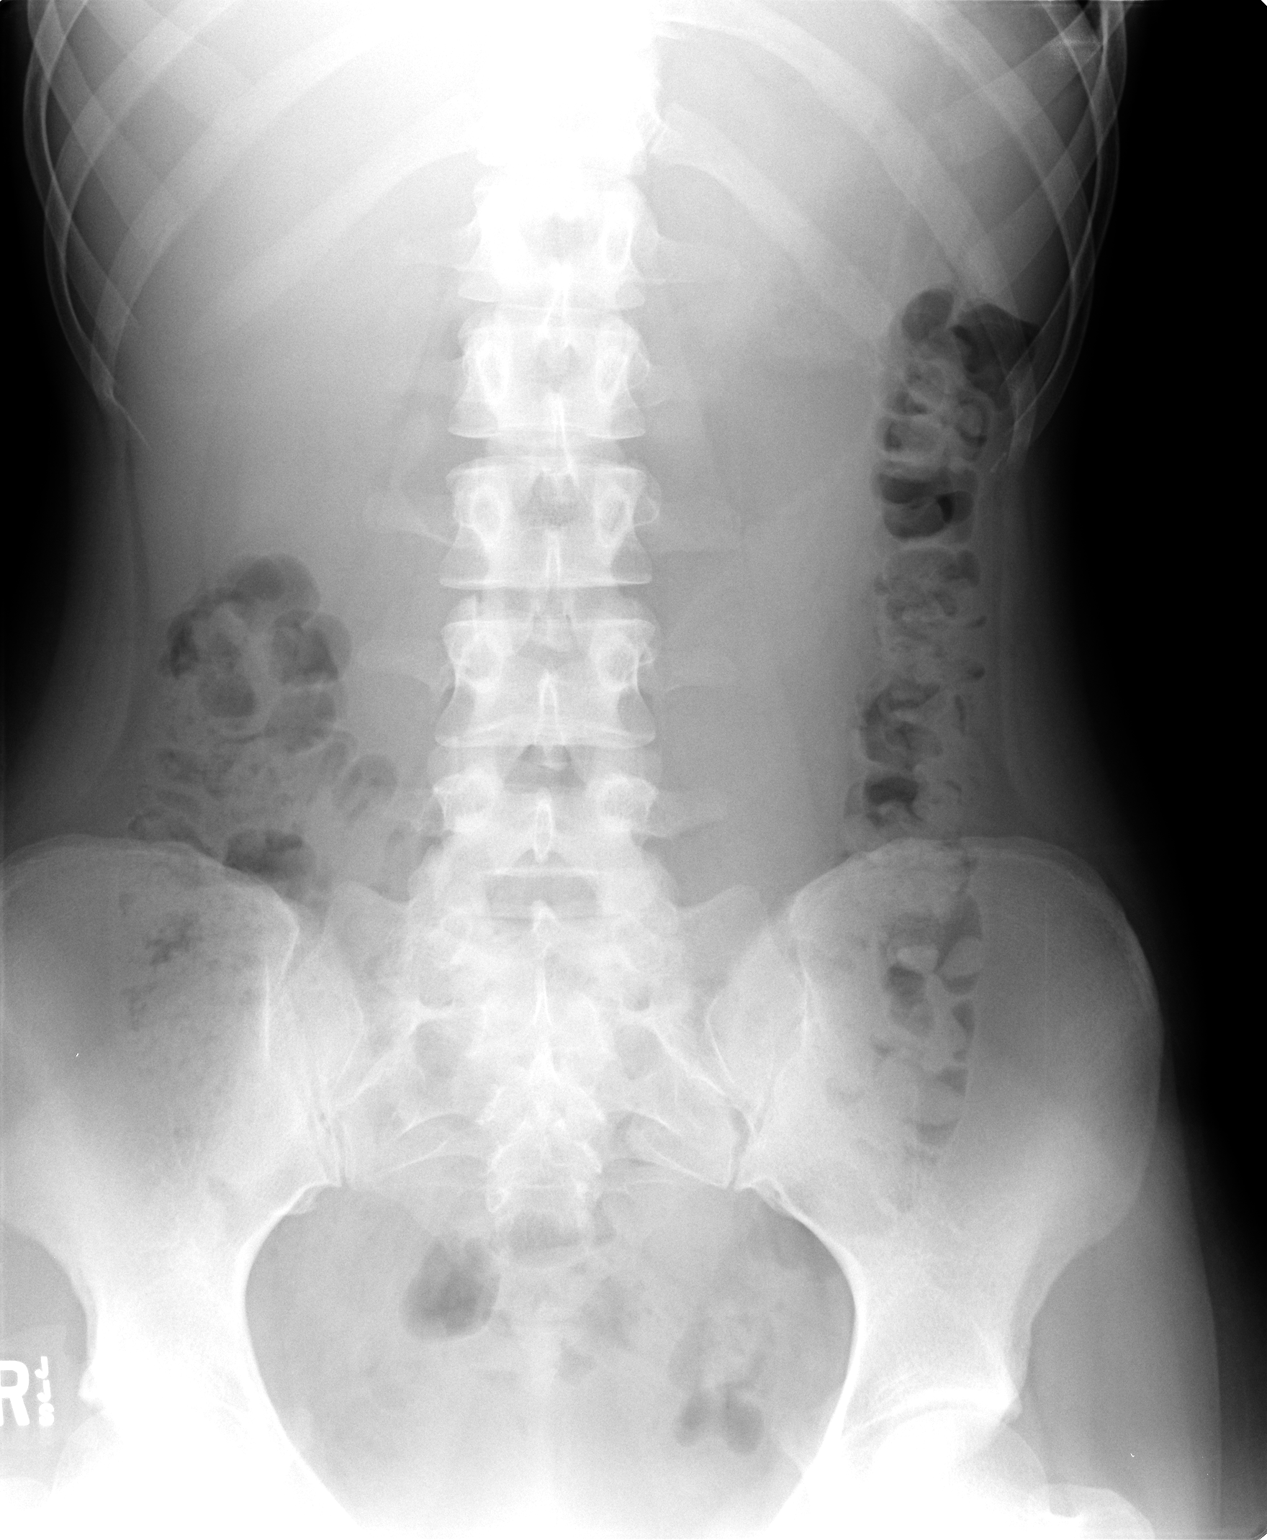

[1 of 1 positions shown; findings below may reference images not displayed]

FINDINGS: Sitz markers have all past.  Moderate stool throughout
the colon.  No disproportionate dilatation of bowel.
IMPRESSION: Nonobstructive bowel gas pattern.

Moderate stool.

All the sitz markers have passed.

## 2014-08-13 ENCOUNTER — Encounter (HOSPITAL_COMMUNITY): Payer: Self-pay | Admitting: Emergency Medicine

## 2014-08-13 ENCOUNTER — Emergency Department (HOSPITAL_COMMUNITY)
Admission: EM | Admit: 2014-08-13 | Discharge: 2014-08-13 | Disposition: A | Discharge status: Home / Self Care | Payer: BC Managed Care – PPO | Attending: Emergency Medicine | Admitting: Emergency Medicine

## 2014-08-13 DIAGNOSIS — Z79899 Other long term (current) drug therapy: Secondary | ICD-10-CM | POA: Diagnosis not present

## 2014-08-13 DIAGNOSIS — R21 Rash and other nonspecific skin eruption: Secondary | ICD-10-CM | POA: Insufficient documentation

## 2014-08-13 DIAGNOSIS — L509 Urticaria, unspecified: Secondary | ICD-10-CM

## 2014-08-13 DIAGNOSIS — K08109 Complete loss of teeth, unspecified cause, unspecified class: Secondary | ICD-10-CM | POA: Insufficient documentation

## 2014-08-13 DIAGNOSIS — R110 Nausea: Secondary | ICD-10-CM | POA: Insufficient documentation

## 2014-08-13 MED ORDER — DIPHENHYDRAMINE HCL 50 MG/ML IJ SOLN
25.0000 mg | Freq: Once | INTRAMUSCULAR | Status: AC
  Administered 2014-08-13: 25 mg via INTRAVENOUS
  Filled 2014-08-13: qty 1

## 2014-08-13 MED ORDER — RANITIDINE HCL 150 MG/10ML PO SYRP
150.0000 mg | ORAL_SOLUTION | Freq: Once | ORAL | Status: AC
  Administered 2014-08-13: 150 mg via ORAL
  Filled 2014-08-13: qty 10

## 2014-08-13 MED ORDER — METHYLPREDNISOLONE SODIUM SUCC 125 MG IJ SOLR
125.0000 mg | Freq: Once | INTRAMUSCULAR | Status: AC
  Administered 2014-08-13: 125 mg via INTRAVENOUS
  Filled 2014-08-13: qty 2

## 2014-08-13 NOTE — ED Notes (Signed)
Pt ambulating to nearby restroom independently to void.

## 2014-08-13 NOTE — ED Provider Notes (Signed)
CSN: [id number]     Arrival date & time 08/13/14  0730 History   First MD Initiated Contact with Patient 08/13/14 [phone]     Chief Complaint  Patient presents with  . Rash  . Nausea     (Consider location/radiation/quality/duration/timing/severity/associated sxs/prior Treatment) HPI Pt presents with c/o generalized itchy rash.  She states the rash started 6 days ago- she had itching on her feet and knees.  Rash spread to involve hands and arms, face.  She was seen by her PMD and given benadryl and prednisone taper. She states the meds helped the rash on her feet, but the other hives have remained.  No involvement of lips or tongue.  No difficulty breathing or shortness of breath.  No vomiting.  No fever/chills.  No other systemic symptoms.  There are no other associated systemic symptoms, there are no other alleviating or modifying factors.   History reviewed. No pertinent past medical history. Past Surgical History  Procedure Laterality Date  . Wisdom tooth extraction     History reviewed. No pertinent family history. History  Substance Use Topics  . Smoking status: Never Smoker   . Smokeless tobacco: Not on file  . Alcohol Use: Yes     Comment: occ   OB History   Grav Para Term Preterm Abortions TAB SAB Ect Mult Living                 Review of Systems ROS reviewed and all otherwise negative except for mentioned in HPI    Allergies  Review of patient's allergies indicates no known allergies.  Home Medications   Prior to Admission medications   Medication Sig Start Date End Date Taking? Authorizing Provider  diphenhydrAMINE (BENADRYL) 25 mg capsule Take 25 mg by mouth every 6 (six) hours as needed for itching or allergies.   Yes Historical Provider, MD  polyethylene glycol (MIRALAX / GLYCOLAX) packet Take 17 g by mouth daily.   Yes Historical Provider, MD  predniSONE (DELTASONE) 20 MG tablet Take 20 mg by mouth.  08/10/14  Yes Historical Provider, MD   BP 118/78  Pulse  61  Temp(Src) 98 F (36.7 C) (Oral)  Resp 16  SpO2 99%  LMP 08/12/2014 Vitals reviewed Physical Exam Physical Examination: General appearance - alert, well appearing, and in no distress Mental status - alert, oriented to person, place, and time Eyes -no conjunctival injection, no scleral icterus Mouth - mucous membranes moist, pharynx normal without lesions Chest - clear to auscultation, no wheezes, rales or rhonchi, symmetric air entry Heart - normal rate, regular rhythm, normal S1, S2, no murmurs, rubs, clicks or gallops Abdomen - soft, nontender, nondistended, no masses or organomegaly Extremities - peripheral pulses normal, no pedal edema, no clubbing or cyanosis Skin - scattered hives over arms, upper legs, face, hands, + blanching  ED Course  Procedures (including critical care time)  10:55 AM pt feeling much improved, rash has almost completely resolved, no further itching.  Labs Review Labs Reviewed - No data to display  Imaging Review No results found.   EKG Interpretation None      MDM   Final diagnoses:  Hives    Pt feeling much improved after IV benadryl solumedrol, zantac.  Hives have mostly resolved.  Recommended continuing benadryl every 6 hours and continue steroid taper.  Discharged with strict return precautions.  Pt agreeable with plan.    Threasa Beards, MD 08/13/14 [phone]

## 2014-08-13 NOTE — Discharge Instructions (Signed)
Return to the ED with any concerns including lip or tongue swelling, difficulty breathing, vomiting, decreased level of alertness/lethargy, or any other alarming symptoms  You should continue to take the prednisone as you have been prescribed, take benadryl 50mg  every 6 hours to block the histamine that causes the itching

## 2014-08-13 NOTE — ED Notes (Signed)
Rash subsiding. Rash noted at present time around knees.

## 2014-08-13 NOTE — ED Notes (Signed)
Pt c/o increasing generalized rash x 6 days and nausea starting this morning.  Sts rash started on knees and has spread all over body.  Pt reports being seen previously by PCP and urgent Care for same.  Pt taking benadryl and prednisone w/o relief.   Denies new creams, lotions, perfumes, and detergents.

## 2014-08-13 NOTE — ED Notes (Addendum)
Pt reports generalized rash for 6 days. Pt reports immunizations up to date. Pt denies bite. Pt denies new topical use. Unknown cause to rash. Pt given benadryl and prednosone for 4 days by PCP. Pt denies SOB or CP but reports tremor and nausea.

## 2015-01-11 ENCOUNTER — Emergency Department (HOSPITAL_COMMUNITY)
Admission: EM | Admit: 2015-01-11 | Discharge: 2015-01-11 | Disposition: A | Discharge status: Home / Self Care | Payer: BLUE CROSS/BLUE SHIELD | Attending: Emergency Medicine | Admitting: Emergency Medicine

## 2015-01-11 ENCOUNTER — Encounter (HOSPITAL_COMMUNITY): Payer: Self-pay | Admitting: Emergency Medicine

## 2015-01-11 DIAGNOSIS — R42 Dizziness and giddiness: Secondary | ICD-10-CM | POA: Diagnosis not present

## 2015-01-11 DIAGNOSIS — J029 Acute pharyngitis, unspecified: Secondary | ICD-10-CM | POA: Insufficient documentation

## 2015-01-11 DIAGNOSIS — Z79899 Other long term (current) drug therapy: Secondary | ICD-10-CM | POA: Diagnosis not present

## 2015-01-11 DIAGNOSIS — K5900 Constipation, unspecified: Secondary | ICD-10-CM | POA: Diagnosis not present

## 2015-01-11 DIAGNOSIS — R531 Weakness: Secondary | ICD-10-CM | POA: Insufficient documentation

## 2015-01-11 DIAGNOSIS — R5383 Other fatigue: Secondary | ICD-10-CM | POA: Diagnosis not present

## 2015-01-11 DIAGNOSIS — Z3202 Encounter for pregnancy test, result negative: Secondary | ICD-10-CM | POA: Diagnosis not present

## 2015-01-11 DIAGNOSIS — R51 Headache: Secondary | ICD-10-CM | POA: Diagnosis not present

## 2015-01-11 DIAGNOSIS — Z792 Long term (current) use of antibiotics: Secondary | ICD-10-CM | POA: Diagnosis not present

## 2015-01-11 LAB — COMPREHENSIVE METABOLIC PANEL
ALT: 8 U/L (ref 0–35)
AST: 20 U/L (ref 0–37)
Albumin: 4.2 g/dL (ref 3.5–5.2)
Alkaline Phosphatase: 52 U/L (ref 39–117)
Anion gap: 7 (ref 5–15)
BUN: 7 mg/dL (ref 6–23)
CALCIUM: 9.1 mg/dL (ref 8.4–10.5)
CO2: 26 mmol/L (ref 19–32)
CREATININE: 0.55 mg/dL (ref 0.50–1.10)
Chloride: 108 mEq/L (ref 96–112)
GFR calc Af Amer: >90 mL/min (ref >90)
Glucose, Bld: 85 mg/dL (ref 70–99)
Potassium: 3.7 mmol/L (ref 3.5–5.1)
SODIUM: 141 mmol/L (ref 135–145)
TOTAL PROTEIN: 6.9 g/dL (ref 6.0–8.3)
Total Bilirubin: 0.6 mg/dL (ref 0.3–1.2)

## 2015-01-11 LAB — URINE MICROSCOPIC-ADD ON

## 2015-01-11 LAB — MONONUCLEOSIS SCREEN: MONO SCREEN: NEGATIVE

## 2015-01-11 LAB — CBC WITH DIFFERENTIAL/PLATELET
Basophils Absolute: 0 10*3/uL (ref 0.0–0.1)
Basophils Relative: 0 % (ref 0–1)
Eosinophils Absolute: 0.1 10*3/uL (ref 0.0–0.7)
Eosinophils Relative: 1 % (ref 0–5)
HEMATOCRIT: 37.4 % (ref 36.0–46.0)
HEMOGLOBIN: 12.6 g/dL (ref 12.0–15.0)
LYMPHS PCT: 35 % (ref 12–46)
Lymphs Abs: 2.1 10*3/uL (ref 0.7–4.0)
MCH: 29 pg (ref 26.0–34.0)
MCHC: 33.7 g/dL (ref 30.0–36.0)
MCV: 86.2 fL (ref 78.0–100.0)
Monocytes Absolute: 0.5 10*3/uL (ref 0.1–1.0)
Monocytes Relative: 8 % (ref 3–12)
NEUTROS ABS: 3.4 10*3/uL (ref 1.7–7.7)
Neutrophils Relative %: 56 % (ref 43–77)
Platelets: 196 10*3/uL (ref 150–400)
RBC: 4.34 MIL/uL (ref 3.87–5.11)
RDW: 12.4 % (ref 11.5–15.5)
WBC: 6.1 10*3/uL (ref 4.0–10.5)

## 2015-01-11 LAB — URINALYSIS, ROUTINE W REFLEX MICROSCOPIC
Bilirubin Urine: NEGATIVE
GLUCOSE, UA: NEGATIVE mg/dL
KETONES UR: NEGATIVE mg/dL
Nitrite: NEGATIVE
Protein, ur: NEGATIVE mg/dL
SPECIFIC GRAVITY, URINE: 1.004 — AB (ref 1.005–1.030)
UROBILINOGEN UA: 0.2 mg/dL (ref 0.0–1.0)
pH: 7 (ref 5.0–8.0)

## 2015-01-11 LAB — PREGNANCY, URINE: PREG TEST UR: NEGATIVE

## 2015-01-11 MED ORDER — ACETAMINOPHEN 500 MG PO TABS
500.0000 mg | ORAL_TABLET | Freq: Once | ORAL | Status: AC
  Administered 2015-01-11: 500 mg via ORAL
  Filled 2015-01-11: qty 1

## 2015-01-11 MED ORDER — SODIUM CHLORIDE 0.9 % IV BOLUS (SEPSIS)
1000.0000 mL | Freq: Once | INTRAVENOUS | Status: AC
  Administered 2015-01-11: 1000 mL via INTRAVENOUS

## 2015-01-11 NOTE — Discharge Instructions (Signed)
Dizziness °Dizziness is a common problem. It is a feeling of unsteadiness or light-headedness. You may feel like you are about to faint. Dizziness can lead to injury if you stumble or fall. A person of any age group can suffer from dizziness, but dizziness is more common in older adults. °CAUSES  °Dizziness can be caused by many different things, including: °· Middle ear problems. °· Standing for too long. °· Infections. °· An allergic reaction. °· Aging. °· An emotional response to something, such as the sight of blood. °· Side effects of medicines. °· Tiredness. °· Problems with circulation or blood pressure. °· Excessive use of alcohol or medicines, or illegal drug use. °· Breathing too fast (hyperventilation). °· An irregular heart rhythm (arrhythmia). °· A low red blood cell count (anemia). °· Pregnancy. °· Vomiting, diarrhea, fever, or other illnesses that cause body fluid loss (dehydration). °· Diseases or conditions such as Parkinson's disease, high blood pressure (hypertension), diabetes, and thyroid problems. °· Exposure to extreme heat. °DIAGNOSIS  °Your health care provider will ask about your symptoms, perform a physical exam, and perform an electrocardiogram (ECG) to record the electrical activity of your heart. Your health care provider may also perform other heart or blood tests to determine the cause of your dizziness. These may include: °· Transthoracic echocardiogram (TTE). During echocardiography, sound waves are used to evaluate how blood flows through your heart. °· Transesophageal echocardiogram (TEE). °· Cardiac monitoring. This allows your health care provider to monitor your heart rate and rhythm in real time. °· Holter monitor. This is a portable device that records your heartbeat and can help diagnose heart arrhythmias. It allows your health care provider to track your heart activity for several days if needed. °· Stress tests by exercise or by giving medicine that makes the heart beat  faster. °TREATMENT  °Treatment of dizziness depends on the cause of your symptoms and can vary greatly. °HOME CARE INSTRUCTIONS  °· Drink enough fluids to keep your urine clear or pale yellow. This is especially important in very hot weather. In older adults, it is also important in cold weather. °· Take your medicine exactly as directed if your dizziness is caused by medicines. When taking blood pressure medicines, it is especially important to get up slowly. °· Rise slowly from chairs and steady yourself until you feel okay. °· In the morning, first sit up on the side of the bed. When you feel okay, stand slowly while holding onto something until you know your balance is fine. °· Move your legs often if you need to stand in one place for a long time. Tighten and relax your muscles in your legs while standing. °· Have someone stay with you for 1-2 days if dizziness continues to be a problem. Do this until you feel you are well enough to stay alone. Have the person call your health care provider if he or she notices changes in you that are concerning. °· Do not drive or use heavy machinery if you feel dizzy. °· Do not drink alcohol. °SEEK IMMEDIATE MEDICAL CARE IF:  °· Your dizziness or light-headedness gets worse. °· You feel nauseous or vomit. °· You have problems talking, walking, or using your arms, hands, or legs. °· You feel weak. °· You are not thinking clearly or you have trouble forming sentences. It may take a friend or family member to notice this. °· You have chest pain, abdominal pain, shortness of breath, or sweating. °· Your vision changes. °· You notice   any bleeding.  You have side effects from medicine that seems to be getting worse rather than better. MAKE SURE YOU:   Understand these instructions.  Will watch your condition.  Will get help right away if you are not doing well or get worse. Document Released: 06/06/2001 Document Revised: 12/16/2013 Document Reviewed: 06/30/2011 [facility]  Patient Information 2015 [place], [place]. This information is not intended to replace advice given to you by your health care provider. Make sure you discuss any questions you have with your health care provider. Fatigue Fatigue is a feeling of tiredness, lack of energy, lack of motivation, or feeling tired all the time. Having enough rest, good nutrition, and reducing stress will normally reduce fatigue. Consult your caregiver if it persists. The nature of your fatigue will help your caregiver to find out its cause. The treatment is based on the cause.  CAUSES  There are many causes for fatigue. Most of the time, fatigue can be traced to one or more of your habits or routines. Most causes fit into one or more of three general areas. They are: Lifestyle problems  Sleep disturbances.  Overwork.  Physical exertion.  Unhealthy habits.  Poor eating habits or eating disorders.  Alcohol and/or drug use .  Lack of proper nutrition (malnutrition). Psychological problems  Stress and/or anxiety problems.  Depression.  Grief.  Boredom. Medical Problems or Conditions  Anemia.  Pregnancy.  Thyroid gland problems.  Recovery from major surgery.  Continuous pain.  Emphysema or asthma that is not well controlled  Allergic conditions.  Diabetes.  Infections (such as mononucleosis).  Obesity.  Sleep disorders, such as sleep apnea.  Heart failure or other heart-related problems.  Cancer.  Kidney disease.  Liver disease.  Effects of certain medicines such as antihistamines, cough and cold remedies, prescription pain medicines, heart and blood pressure medicines, drugs used for treatment of cancer, and some antidepressants. SYMPTOMS  The symptoms of fatigue include:   Lack of energy.  Lack of drive (motivation).  Drowsiness.  Feeling of indifference to the surroundings. DIAGNOSIS  The details of how you feel help guide your caregiver in finding out what is causing  the fatigue. You will be asked about your present and past health condition. It is important to review all medicines that you take, including prescription and non-prescription items. A thorough exam will be done. You will be questioned about your feelings, habits, and normal lifestyle. Your caregiver may suggest blood tests, urine tests, or other tests to look for common medical causes of fatigue.  TREATMENT  Fatigue is treated by correcting the underlying cause. For example, if you have continuous pain or depression, treating these causes will improve how you feel. Similarly, adjusting the dose of certain medicines will help in reducing fatigue.  HOME CARE INSTRUCTIONS   Try to get the required amount of good sleep every night.  Eat a healthy and nutritious diet, and drink enough water throughout the day.  Practice ways of relaxing (including yoga or meditation).  Exercise regularly.  Make plans to change situations that cause stress. Act on those plans so that stresses decrease over time. Keep your work and personal routine reasonable.  Avoid street drugs and minimize use of alcohol.  Start taking a daily multivitamin after consulting your caregiver. SEEK MEDICAL CARE IF:   You have persistent tiredness, which cannot be accounted for.  You have fever.  You have unintentional weight loss.  You have headaches.  You have disturbed sleep throughout the night.  You are feeling sad.  You have constipation.  You have dry skin.  You have gained weight.  You are taking any new or different medicines that you suspect are causing fatigue.  You are unable to sleep at night.  You develop any unusual swelling of your legs or other parts of your body. SEEK IMMEDIATE MEDICAL CARE IF:   You are feeling confused.  Your vision is blurred.  You feel faint or pass out.  You develop severe headache.  You develop severe abdominal, pelvic, or back pain.  You develop chest pain,  shortness of breath, or an irregular or fast heartbeat.  You are unable to pass a normal amount of urine.  You develop abnormal bleeding such as bleeding from the rectum or you vomit blood.  You have thoughts about harming yourself or committing suicide.  You are worried that you might harm someone else. MAKE SURE YOU:   Understand these instructions.  Will watch your condition.  Will get help right away if you are not doing well or get worse. Document Released: 10/08/2007 Document Revised: 03/04/2012 Document Reviewed: 04/14/2014 [facility] Patient Information 2015 [place], [place]. This information is not intended to replace advice given to you by your health care provider. Make sure you discuss any questions you have with your health care provider.  Emergency Department Resource Guide 1) Find a Doctor and Pay Out of Pocket Although you won't have to find out who is covered by your insurance plan, it is a good idea to ask around and get recommendations. You will then need to call the office and see if the doctor you have chosen will accept you as a new patient and what types of options they offer for patients who are self-pay. Some doctors offer discounts or will set up payment plans for their patients who do not have insurance, but you will need to ask so you aren't surprised when you get to your appointment.  2) Contact Your Local Health Department Not all health departments have doctors that can see patients for sick visits, but many do, so it is worth a call to see if yours does. If you don't know where your local health department is, you can check in your phone book. The CDC also has a tool to help you locate your state's health department, and many state websites also have listings of all of their local health departments.  3) Find a [facility] If your illness is not likely to be very severe or complicated, you may want to try a walk in clinic. These are popping up all over the  country in pharmacies, drugstores, and shopping centers. They're usually staffed by nurse practitioners or physician assistants that have been trained to treat common illnesses and complaints. They're usually fairly quick and inexpensive. However, if you have serious medical issues or chronic medical problems, these are probably not your best option.  No Primary Care Doctor: - Call Health Connect at  [phone] - they can help you locate a primary care doctor that  accepts your insurance, provides certain services, etc. - Physician Referral Service- [phone]  Chronic Pain Problems: Organization         Address  Phone   Notes  [facility]  [phone] Patients need to be referred by their primary care doctor.   Medication Assistance: Organization         Address  Phone   Notes  [facility] Medication Assistance Program [address] [place], [place] [address] [phone] --Must be a resident of [facility] -- Must have NO insurance coverage whatsoever (no Medicaid/ Medicare, etc.) -- The pt. MUST have a primary care doctor that directs their care regularly and follows them in the community   [facility]ist  [phone]   [employer]  [phone]    Agencies that provide inexpensive medical care: Organization         Address  Phone   Notes  [place]  [phone]   [facility]    [phone]   [facility] [place], [place] [address] [phone]   [place] [address] [place] [phone]   Planned Parenthood    [phone]   [facility]    [phone]   [place] and [address], [place] Phone:  [phone], Fax:  [phone] Hours of Operation:  9 am - 6 pm, M-F.  Also accepts Medicaid/Medicare and self-pay.  [facility] for [address], [address], [place] Phone: [phone], Fax: [phone]. Hours of Operation:  8:30 am - 5:30 pm, M-F.  Also accepts Medicaid and self-pay.  [facility] [place] [address] [place] Phone: [phone]   [place], [place], [place] [phone], Ext. [phone] Mondays & Thursdays: 7-9 AM.  First 15 patients are seen on a first come, first serve basis.    [place] Providers:  Organization         Address  Phone   Notes  [facility] [address],  [phone] Also accepts self-pay patients.  [facility] [address], [place]  [phone]   [place], [address], [place] [phone]   [facility] [address] [place] [phone]   Lucianne Lei [address], [place]   [phone] Only accepts [place] Access [place] patients after they have their name applied to their card.   Self-Pay (no insurance) in [facility]:  Organization         Address  Phone   Notes  Sickle Cell Patients, [facility] [place] [phone]   [facility] [place] [phone]   [facility] [place]  [place], [place], [place] [phone]   [facility]/Dr. Osei-Bonsu  [address] [place] or [address], [place] [phone] Phone number for both [place] and [place] locations is the same.  [facility] [address] [place] [phone]   [facility] [address], [place] or [address] [phone]   [facility] [address] [place] [phone], phone; [phone], fax Sees patients 1st and 3rd Saturday of every month.  Must  not qualify for public or private insurance (i.e. Medicaid, Medicare, [place] Health Choice, Veterans' Benefits)  Household income should be no more than 200% of the poverty level The clinic cannot treat you if you are pregnant or think you are pregnant  Sexually transmitted diseases are not treated at the clinic.  Dental Care: Organization         Address  Phone  Notes  [facility] [place] [phone] Accepts children up to age 69 who are enrolled in [place] or [place]; pregnant women with a Medicaid card; and children who have applied for Medicaid or [place] Health Choice, but were declined, whose parents can pay a reduced fee at time of service.  [facility] Department of [facility]  [address], [place] [phone] Accepts children up to age 46 who are enrolled in [place] or [place]; pregnant women with a Medicaid card; and children who have applied for Medicaid or [place] Health Choice, but were declined, whose parents can pay a reduced fee at time of service.  [place] Adult [facility] Access PROGRAM  [place] [phone] Patients are seen by appointment only. Walk-ins are not accepted. [place] will see patients 57 years of age and older. Monday - Tuesday (8am-5pm) Most Wednesdays (8:30-5pm) $30 per visit, cash only  [facility] Adult Dental Access PROGRAM  [address], [facility] [phone] Patients are seen by appointment only. Walk-ins are not accepted. [place] will see patients 52 years of age and older. One Wednesday Evening (Monthly: Volunteer Based).  $30 per visit, cash only  [place]  [phone] for adults; Children under age 48, call Graduate Pediatric Dentistry at [phone]. Children aged 51-14, please call [phone] to request a pediatric application.  Dental services are  provided in all areas of dental care including fillings, crowns and bridges, complete and partial dentures, implants, gum treatment, root canals, and extractions. Preventive care is also provided. Treatment is provided to both adults and children. Patients are selected via a lottery and there is often a waiting list.   [facility] [address], [place]  [phone] [email or web address]   [facility] [address] [place], [place] [phone], Ext. [phone] Second and Fourth Thursday of each month, opens at 6:30 AM; Clinic ends at 9 AM.  Patients are seen on a first-come first-served basis, and a limited number are seen during each clinic.   [facility]  [address] [place]ard Danker [place], [place] [phone]   Eligibility Requirements You must have lived in [place], [place], or [place] counties for at least the last three months.   You cannot be eligible for state or federal sponsored [employer], including [employer], [place], or [employer].   You generally cannot be eligible for healthcare insurance through your employer.    How to apply: Eligibility screenings are held every Tuesday and Wednesday afternoon from 1:00 pm until 4:00 pm. You do not need an appointment for the interview!  [facility] [address], [place], [place]   [place]  [place] Department  [place]  [phone]    Behavioral Health Resources in the Community: Intensive Outpatient Programs Organization         Address  Phone  Notes  [place]. [address] [place], [place] [phone]   [facility] Outpatient [address] [place], [place]   ADS: Alcohol & Drug S[facility] [address] [place], [place]   [place] [address],  [place], [place] or [phone]   Substance Abuse Resources Organization         Address  Phone  Notes  Alcohol and Drug Services  [place]  [phone]   [facility] [place]  [phone]   Chinita Pester  [phone]   Residential & Outpatient Substance Abuse Program  [phone]   Psychological Services Organization         Address  Phone  Notes  [facility] [place]  [place]  [phone]   [place] [address], [place] or [phone]    Mobile Crisis Teams Organization         Address  Phone  Notes  [facility], Mobile Crisis Care Unit  [phone]   [facility]  [address] [place], [place]   [place] [address], [place] [phone]    Self-Help/Support Groups Organization         Address  Phone             Notes  [place]. of [place] - variety of support groups  [place] Call for more information  Narcotics Anonymous (NA), [facility] [address], [employer] [place]  2 meetings at this location   Special educational needs teacher         Address  Phone  Notes  [facility] Residential Treatment [place],    [place]  [phone]   [facility]  [address] [place] [phone], [place], [place]   [place], [place] [phone] Admissions: 8am-3pm M-F  Incentives Substance [place] [address],    [place], [place] [phone]   [facility] [address] [place], [place], [place]   [facility] [address],  [place], [place]   [facility]ight Programs - Intensive Outpatient [address], [place], [place]   [facility] [address]  [place], [place] [phone] or  [phone]   [facility] (RTS) [address], [place], [place] Accepts Medicaid  [facility] [place] [address]  [place] [phone] Substance Abuse/Addiction Treatment   [facility] Organization         Address  Phone  Notes  [facility]  [phone]   Domenic Schwab, PhD [address] Arlis Porta [place], [place]   [phone] or [phone]   [facility], [place] [phone]   [facility] [address], [place], [place] [phone] Insurance/Medicaid/sponsorship through [facility] and Families [address], [address] [place]                                    [place], [place] [phone] [place] [address], [place] [phone]    Dr. Adele Schilder  [phone]   [facility] Dept. 1) [address] [place] 2) [place] 3)  [place] [address], [place] [phone]  [phone]   [place] [phone] or [phone] (After Hours)

## 2015-01-11 NOTE — ED Provider Notes (Signed)
CSN: [id number]     Arrival date & time 01/11/15  1555 History   First MD Initiated Contact with Patient 01/11/15 1648     Chief Complaint  Patient presents with  . Fatigue  . Dizziness  . Headache   Caroline [name] is a 24 y.o. female with a history of chronic constipation who presents to the emergency department complaining of fatigue, headache, lightheadedness and subjective fever for the past 7 days. Patient also reports some sore throat and nasal congestion. She also reports that her legs feel heavy and weak with tingling in her bilateral feet intermittently. Patient rates her headache at a 6 out of 10 with improvements while taking Advil. Patient last took advil 400 mg at 12:30 today. Patient reports she saw her primary care doctor 3 days ago and was told she had a sinus infection and started on Augmentin. Patient reports lots of standing at work and feeling lightheaded and weak as the day progresses. Patient's last menstrual cycle started yesterday. Patient reports she was seen at [facility] urgent care where she was told she needed to go to the emergency department. Patient reports her father has pneumonia and her sister is at home with a GI illness. Patient reports her vision has been getting worse over the past year. Patient wears glasses and last had them updated in June of 2015. The patient denies room spinning dizziness. The patient denies loss of consciousness, vomiting, hematemesis, hematochezia, dysuria, hematuria, urinary frequency, urinary urgency, productive cough, wheezing, abdominal pain, shortness of breath, chest pain, palpitations, history of pneumonia.  (Consider location/radiation/quality/duration/timing/severity/associated sxs/prior Treatment) HPI  History reviewed. No pertinent past medical history. Past Surgical History  Procedure Laterality Date  . Wisdom tooth extraction     No family history on file. History  Substance Use Topics  . Smoking status: Never Smoker    . Smokeless tobacco: Not on file  . Alcohol Use: Yes     Comment: occ   OB History    No data available     Review of Systems  Constitutional: Positive for fever, chills and fatigue.  HENT: Positive for sore throat. Negative for congestion, ear pain, rhinorrhea, sinus pressure and trouble swallowing.   Eyes: Positive for visual disturbance. Negative for pain.  Respiratory: Negative for cough, shortness of breath and wheezing.   Cardiovascular: Negative for chest pain and palpitations.  Gastrointestinal: Negative for nausea, vomiting, abdominal pain and diarrhea.  Genitourinary: Negative for dysuria, urgency, frequency, hematuria and vaginal discharge.  Musculoskeletal: Negative for back pain and neck pain.  Skin: Negative for rash.  Neurological: Positive for weakness, light-headedness and headaches. Negative for syncope.      Allergies  Review of patient's allergies indicates no known allergies.  Home Medications   Prior to Admission medications   Medication Sig Start Date End Date Taking? Authorizing Provider  amoxicillin-clavulanate (AUGMENTIN) 500-125 MG per tablet Take 1 tablet by mouth 2 (two) times daily.   Yes Historical Provider, MD  ibuprofen (ADVIL,MOTRIN) 200 MG tablet Take 400 mg by mouth every 6 (six) hours as needed for moderate pain (pain).   Yes Historical Provider, MD  polyethylene glycol (MIRALAX / GLYCOLAX) packet Take 17 g by mouth daily.   Yes Historical Provider, MD  diphenhydrAMINE (BENADRYL) 25 mg capsule Take 25 mg by mouth every 6 (six) hours as needed for itching or allergies.    Historical Provider, MD   BP 110/67 mmHg  Pulse 83  Temp(Src) 98 F (36.7 C) (Oral)  Resp 20  SpO2 100%  LMP 01/10/2015 (Exact Date) Physical Exam  Constitutional: She is oriented to person, place, and time. She appears well-developed and well-nourished. No distress.  HENT:  Head: Normocephalic and atraumatic.  Right Ear: External ear normal.  Left Ear: External  ear normal.  Nose: Nose normal.  Mouth/Throat: Oropharynx is clear and moist. No oropharyngeal exudate.  Bilateral tympanic membranes are pearly-gray without erythema or loss of landmarks. No temporal tenderness.  Eyes: Conjunctivae and EOM are normal. Pupils are equal, round, and reactive to light. Right eye exhibits no discharge. Left eye exhibits no discharge.  Neck: Normal range of motion. Neck supple.  Cardiovascular: Normal rate, regular rhythm, normal heart sounds and intact distal pulses.  Exam reveals no gallop and no friction rub.   No murmur heard. Bilateral radial pulses are intact. Bilateral posterior tibialis pulses are intact.  Pulmonary/Chest: Effort normal and breath sounds normal. No respiratory distress. She has no wheezes. She has no rales.  Abdominal: Soft. Bowel sounds are normal. She exhibits no distension and no mass. There is no tenderness. There is no rebound and no guarding.  Abdomen is soft and nontender to palpation.  Musculoskeletal: She exhibits no edema.  Patient is 5 out of 5 strength in bilateral upper and lower extremities. No lower extremity edema noted. The patient is able to ambulate in the room without difficulty or assistance.  Lymphadenopathy:    She has no cervical adenopathy.  Neurological: She is alert and oriented to person, place, and time. No cranial nerve deficit. Coordination normal.  Patient's strength is 5 out of 5 in her bilateral upper and lower extremities. Patient has good sensation in her bilateral lower extremities.   Skin: Skin is warm and dry. No rash noted. She is not diaphoretic. No erythema. No pallor.  Psychiatric: She has a normal mood and affect. Her behavior is normal.  Nursing note and vitals reviewed.   ED Course  Procedures (including critical care time) Labs Review Labs Reviewed  URINALYSIS, ROUTINE W REFLEX MICROSCOPIC - Abnormal; Notable for the following:    Specific Gravity, Urine 1.004 (*)    Hgb urine dipstick  LARGE (*)    Leukocytes, UA TRACE (*)    All other components within normal limits  COMPREHENSIVE METABOLIC PANEL  CBC WITH DIFFERENTIAL  PREGNANCY, URINE  MONONUCLEOSIS SCREEN  URINE MICROSCOPIC-ADD ON    Imaging Review No results found.   EKG Interpretation None      Filed Vitals:   01/11/15 1610 01/11/15 1903 01/11/15 2125  BP: 130/76 118/77 110/67  Pulse: 84 78 83  Temp: 98 F (36.7 C)    TempSrc: Oral    Resp: 16 16 20   SpO2: 100% 100% 100%     MDM   Meds given in ED:  Medications  acetaminophen (TYLENOL) tablet 500 mg (500 mg Oral Given 01/11/15 1730)  sodium chloride 0.9 % bolus 1,000 mL (0 mLs Intravenous Stopped 01/11/15 2126)    Discharge Medication List as of 01/11/2015  9:19 PM      Final diagnoses:  Lightheaded  Other fatigue   This is a 24 year old female who presents to the emergency department with multitude of complaints including fatigue, lightheadedness, and subjective fever for the past week. Patient denies loss of consciousness. The patient is afebrile and nontoxic appearing. Patient's physical exam is unremarkable. The patient is neurologically intact. The patient is able to ambulate in the room without difficulty or assistance. The patient denies feeling lightheaded whilst walking in the room. Patient  has negative urine pregnancy test. The patient's urinalysis is positive for hemoglobin is consistent with her being on her menstrual cycle currently. The patient's CMP and CBC are within normal limits. The patient has a negative mono screen. During the exam and in the room the patient is acting appropriately and is in no apparent distress. Patient's physical exam and lab workup are unremarkable. I advised patient to follow-up with her primary care provider this week for further evaluation. Strict return precautions were provided. I advised patient to return to the emergency department with new or worsening symptoms or new concerns. The patient verbalized  understanding and agreement with plan.  This patient was discussed with Dr. Eulis Foster who agrees with assessment and plan.    Hanley Hays, PA-C 01/12/15 0127  Richarda Blade, MD 01/12/15 [phone]

## 2015-01-11 NOTE — Progress Notes (Signed)
  CARE MANAGEMENT ED NOTE 01/11/2015  Patient:  Caroline [name]   Account Number:  [id number]  Date Initiated:  01/11/2015  Documentation initiated by:  Livia Snellen  Subjective/Objective Assessment:   Patient presents to Ed with increased dizziness, blurred vision and headaches     Subjective/Objective Assessment Detail:     Action/Plan:   Action/Plan Detail:   Anticipated DC Date:       Status Recommendation to Physician:   Result of Recommendation:    Other ED [place]  Other  PCP issues    Choice offered to / List presented to:            Status of service:  Completed, signed off  ED Comments:   ED Comments Detail:  EDCM spoke to patient at bedside.  Patient confirms her pcp is Dr. Felisa [name] in [place].  Patient requesting a list of pcps who accept [employer] insurnace.  [facility] provided patient with list of pcps who accept BCBS insurnace within a 25 mile radius of patient's zip code [phone].  Patient thankful for resources.  No further EDCM needs at this time.

## 2015-01-11 NOTE — ED Notes (Addendum)
Pt c/o fatigue and dizziness since last Tuesday states it has gotten worse. Pt also states her vision is blurry and is started like this last January. Pt has multiple complaints. Pt also c/o headache x 2 weeks.

## 2015-10-18 ENCOUNTER — Encounter: Payer: Self-pay | Admitting: Internal Medicine

## 2015-12-08 ENCOUNTER — Ambulatory Visit (INDEPENDENT_AMBULATORY_CARE_PROVIDER_SITE_OTHER): Payer: BLUE CROSS/BLUE SHIELD | Admitting: Neurology

## 2015-12-08 ENCOUNTER — Encounter: Payer: Self-pay | Admitting: Neurology

## 2015-12-08 ENCOUNTER — Encounter: Payer: Self-pay | Admitting: *Deleted

## 2015-12-08 VITALS — BP 115/75 | HR 95 | Ht 67.0 in | Wt 137.6 lb

## 2015-12-08 DIAGNOSIS — M609 Myositis, unspecified: Secondary | ICD-10-CM | POA: Diagnosis not present

## 2015-12-08 DIAGNOSIS — R202 Paresthesia of skin: Secondary | ICD-10-CM

## 2015-12-08 DIAGNOSIS — H539 Unspecified visual disturbance: Secondary | ICD-10-CM | POA: Diagnosis not present

## 2015-12-08 DIAGNOSIS — G629 Polyneuropathy, unspecified: Secondary | ICD-10-CM

## 2015-12-08 DIAGNOSIS — R51 Headache: Secondary | ICD-10-CM

## 2015-12-08 DIAGNOSIS — R42 Dizziness and giddiness: Secondary | ICD-10-CM

## 2015-12-08 DIAGNOSIS — M791 Myalgia: Secondary | ICD-10-CM

## 2015-12-08 DIAGNOSIS — IMO0001 Reserved for inherently not codable concepts without codable children: Secondary | ICD-10-CM

## 2015-12-08 DIAGNOSIS — R531 Weakness: Secondary | ICD-10-CM | POA: Diagnosis not present

## 2015-12-08 DIAGNOSIS — R519 Headache, unspecified: Secondary | ICD-10-CM

## 2015-12-08 NOTE — Patient Instructions (Signed)
Remember to drink plenty of fluid, eat healthy meals and do not skip any meals. Try to eat protein with a every meal and eat a healthy snack such as fruit or nuts in between meals. Try to keep a regular sleep-wake schedule and try to exercise daily, particularly in the form of walking, 20-30 minutes a day, if you can.   As far as diagnostic testing: Labs, MRI, EMG/NCS  I would like to see you back for emg/ncs, sooner if we need to. Please call us with any interim questions, concerns, problems, updates or refill requests.    Our phone number is [phone]. We also have an after hours call service for urgent matters and there is a physician on-call for urgent questions. For any emergencies you know to call 911 or go to the nearest emergency room
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
## 2015-12-10 ENCOUNTER — Telehealth: Payer: Self-pay | Admitting: *Deleted

## 2015-12-10 LAB — COMPREHENSIVE METABOLIC PANEL
ALBUMIN: 4.6 g/dL (ref 3.5–5.5)
ALK PHOS: 57 IU/L (ref 39–117)
ALT: 6 IU/L (ref 0–32)
AST: 15 IU/L (ref 0–40)
Albumin/Globulin Ratio: 2.3 (ref 1.1–2.5)
BUN / CREAT RATIO: 13 (ref 8–20)
BUN: 8 mg/dL (ref 6–20)
Bilirubin Total: 0.5 mg/dL (ref 0.0–1.2)
CO2: 26 mmol/L (ref 18–29)
CREATININE: 0.63 mg/dL (ref 0.57–1.00)
Calcium: 9.5 mg/dL (ref 8.7–10.2)
Chloride: 100 mmol/L (ref 96–106)
GFR, EST AFRICAN AMERICAN: 145 mL/min/{1.73_m2} (ref >59)
GFR, EST NON AFRICAN AMERICAN: 126 mL/min/{1.73_m2} (ref >59)
GLOBULIN, TOTAL: 2 g/dL (ref 1.5–4.5)
GLUCOSE: 83 mg/dL (ref 65–99)
Potassium: 3.9 mmol/L (ref 3.5–5.2)
SODIUM: 138 mmol/L (ref 134–144)
TOTAL PROTEIN: 6.6 g/dL (ref 6.0–8.5)

## 2015-12-10 LAB — HEPATITIS C ANTIBODY

## 2015-12-10 LAB — HEMOGLOBIN A1C
Est. average glucose Bld gHb Est-mCnc: 111 mg/dL
HEMOGLOBIN A1C: 5.5 % (ref 4.8–5.6)

## 2015-12-10 LAB — HIV ANTIBODY (ROUTINE TESTING W REFLEX): HIV SCREEN 4TH GENERATION: NONREACTIVE

## 2015-12-10 LAB — HEAVY METALS, BLOOD
ARSENIC: 4 ug/L (ref 2–23)
Lead, Blood: NOT DETECTED ug/dL (ref 0–19)
MERCURY: NOT DETECTED ug/L (ref 0.0–14.9)

## 2015-12-10 LAB — RPR: RPR: NONREACTIVE

## 2015-12-10 LAB — B12 AND FOLATE PANEL
FOLATE: 10 ng/mL (ref >3.0)
Vitamin B-12: 305 pg/mL (ref 211–946)

## 2015-12-10 LAB — ANA W/REFLEX: ANA: NEGATIVE

## 2015-12-10 LAB — CK: Total CK: 38 U/L (ref 24–173)

## 2015-12-10 LAB — GLIADIN ANTIBODIES, SERUM
ANTIGLIADIN ABS, IGA: 1 U (ref 0–19)
GLIADIN IGG: 2 U (ref 0–19)

## 2015-12-10 LAB — TISSUE TRANSGLUTAMINASE, IGA: Transglutaminase IgA: <2 U/mL (ref 0–3)

## 2015-12-10 LAB — TSH: TSH: 2.15 u[IU]/mL (ref 0.450–4.500)

## 2015-12-10 LAB — RHEUMATOID FACTOR: Rhuematoid fact SerPl-aCnc: <10 IU/mL (ref 0.0–13.9)

## 2015-12-10 NOTE — Telephone Encounter (Signed)
LVM for pt to call about results. Ok to inform pt labs normal per Dr [name].

## 2015-12-10 NOTE — Telephone Encounter (Signed)
-----   Message from Melvenia Beam, MD sent at 12/10/2015  7:58 AM EST ----- Labs all normal thanks

## 2015-12-10 NOTE — Telephone Encounter (Signed)
Patient returned Emma's call, advised patient Labs all normal.

## 2015-12-14 ENCOUNTER — Ambulatory Visit (INDEPENDENT_AMBULATORY_CARE_PROVIDER_SITE_OTHER): Payer: Self-pay

## 2015-12-14 DIAGNOSIS — Z0289 Encounter for other administrative examinations: Secondary | ICD-10-CM

## 2015-12-14 DIAGNOSIS — G629 Polyneuropathy, unspecified: Secondary | ICD-10-CM

## 2015-12-14 DIAGNOSIS — R51 Headache: Secondary | ICD-10-CM

## 2015-12-14 DIAGNOSIS — IMO0001 Reserved for inherently not codable concepts without codable children: Secondary | ICD-10-CM

## 2015-12-14 DIAGNOSIS — R42 Dizziness and giddiness: Secondary | ICD-10-CM

## 2015-12-14 DIAGNOSIS — R531 Weakness: Secondary | ICD-10-CM

## 2015-12-14 DIAGNOSIS — R519 Headache, unspecified: Secondary | ICD-10-CM

## 2015-12-14 DIAGNOSIS — H539 Unspecified visual disturbance: Secondary | ICD-10-CM

## 2015-12-14 DIAGNOSIS — R202 Paresthesia of skin: Secondary | ICD-10-CM

## 2015-12-19 ENCOUNTER — Telehealth: Payer: Self-pay | Admitting: Neurology

## 2015-12-19 NOTE — Telephone Encounter (Signed)
Please cal patient and let her know that her MRI of her brain was within normal limits. We did find an incidental pineal cyst and one other finding that is not concerning but I would like her to follow up with me in the office so we can discuss and I cam show her the images. We could also briefly discuss it at her EMG/NCS if she likes. Thanks.   IMPRESSION:  Equivocal MRI brain (with and without) demonstrating: 1. Small 106mm right frontal periventricular focus of non-specific gliosis. No abnormal lesions are seen on post contrast views.  2. Incidental pineal cyst noted (25mm). If clinically indicated, may consider repeat scan in 6-12 months to ensure stability.

## 2015-12-21 NOTE — Telephone Encounter (Signed)
Left voicemail asking patient to call back for results.

## 2015-12-22 NOTE — Telephone Encounter (Signed)
Pt called requesting a call back with results

## 2015-12-22 NOTE — Telephone Encounter (Signed)
I spoke to Caroline [name] and she is aware of results and will talk more about it at visit on 1/10.

## 2015-12-22 NOTE — Telephone Encounter (Signed)
Patient returned [place] call

## 2016-01-03 ENCOUNTER — Telehealth: Payer: Self-pay | Admitting: *Deleted
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
## 2016-01-04 ENCOUNTER — Encounter: Payer: BLUE CROSS/BLUE SHIELD | Admitting: Neurology

## 2016-01-31 ENCOUNTER — Encounter: Payer: Self-pay | Admitting: Neurology

## 2016-01-31 ENCOUNTER — Encounter: Payer: BLUE CROSS/BLUE SHIELD | Admitting: Neurology

## 2016-01-31 ENCOUNTER — Ambulatory Visit (INDEPENDENT_AMBULATORY_CARE_PROVIDER_SITE_OTHER): Payer: BLUE CROSS/BLUE SHIELD | Admitting: Neurology

## 2016-01-31 VITALS — BP 123/74 | HR 90 | Ht 67.0 in | Wt 136.4 lb

## 2016-01-31 DIAGNOSIS — R9082 White matter disease, unspecified: Secondary | ICD-10-CM

## 2016-01-31 DIAGNOSIS — IMO0002 Reserved for concepts with insufficient information to code with codable children: Secondary | ICD-10-CM

## 2016-01-31 DIAGNOSIS — E348 Other specified endocrine disorders: Secondary | ICD-10-CM | POA: Diagnosis not present

## 2016-01-31 DIAGNOSIS — G9349 Other encephalopathy: Secondary | ICD-10-CM

## 2016-01-31 HISTORY — DX: White matter disease, unspecified: R90.82
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
## 2016-02-08 ENCOUNTER — Ambulatory Visit: Payer: BLUE CROSS/BLUE SHIELD | Admitting: Podiatry

## 2016-02-10 ENCOUNTER — Ambulatory Visit: Payer: BLUE CROSS/BLUE SHIELD | Admitting: Podiatry

## 2016-02-10 ENCOUNTER — Encounter: Payer: Self-pay | Admitting: Podiatry

## 2016-02-10 ENCOUNTER — Ambulatory Visit (INDEPENDENT_AMBULATORY_CARE_PROVIDER_SITE_OTHER): Payer: BLUE CROSS/BLUE SHIELD | Admitting: Podiatry

## 2016-02-10 ENCOUNTER — Ambulatory Visit (INDEPENDENT_AMBULATORY_CARE_PROVIDER_SITE_OTHER): Payer: BLUE CROSS/BLUE SHIELD

## 2016-02-10 VITALS — BP 95/68 | HR 78 | Resp 18

## 2016-02-10 DIAGNOSIS — Q6650 Congenital pes planus, unspecified foot: Secondary | ICD-10-CM | POA: Diagnosis not present

## 2016-02-10 DIAGNOSIS — R52 Pain, unspecified: Secondary | ICD-10-CM

## 2016-02-10 DIAGNOSIS — M779 Enthesopathy, unspecified: Secondary | ICD-10-CM

## 2016-02-10 DIAGNOSIS — M792 Neuralgia and neuritis, unspecified: Secondary | ICD-10-CM | POA: Diagnosis not present

## 2016-02-10 NOTE — Progress Notes (Signed)
   Subjective:    Patient ID: Caroline [name], female    DOB: 01-30-1991, 25 y.o.   MRN: [id number]  HPI  25 year old female presents to the office today for concerns of bilateral feet pain and intermittent numbness. She states she has had other "neurological issues" and she has seen a neurologist. She states they did not believe her symptoms were neurological in origin. She was previously scheduled for NCV/EMG but she cancelled. She states she is on her feet for several hours at work on hard surfaces which increases her symptoms. She is requesting orthotics today. No recent injury or trauma. She currently denies any numbness or tingling. No other complaints at this time.   Review of Systems  All other systems reviewed and are negative.      Objective:   Physical Exam General: AAO x3, NAD  Dermatological: Skin is warm, dry and supple bilateral. Nails x 10 are well manicured; remaining integument appears unremarkable at this time. There are no open sores, no preulcerative lesions, no rash or signs of infection present.  Vascular: Dorsalis Pedis artery and Posterior Tibial artery pedal pulses are 2/4 bilateral with immedate capillary fill time. Pedal hair growth present. No varicosities and no lower extremity edema present bilateral. There is no pain with calf compression, swelling, warmth, erythema.   Neruologic: Grossly intact via light touch bilateral. Vibratory intact via tuning fork bilateral. Protective threshold with Semmes Wienstein monofilament intact to all pedal sites bilateral. Patellar and Achilles deep tendon reflexes 2+ bilateral. No Babinski or clonus noted bilateral. Negative Tinel sign. Currently without any subjective numbness or tingling.  Musculoskeletal: There is a decrease in medial arch height upon weightbearing. Equinus is present. Ankle, subtalar, midtarsal range of motion is intact for any restrictions or crepitation. There is no specific area pinpoint bony tenderness or  pain the vibratory sensation. Muscular strength 5/5 in all groups tested bilateral.  Gait: Unassisted, Nonantalgic.      Assessment & Plan:  25 year old female with bilateral foot pain likely biomechanical in nature. -Treatment options discussed including all alternatives, risks, and complications -Etiology of symptoms were discussed -X-rays were obtained and reviewed with the patient. No definitive evidence acute fractures stress fracture. Evidence of flatfoot. -At this time she's requesting orthotics. I do believe these will help support her foot and her numbness may be biomechanical nature as well. She was scanned for orthotics and there is an to [employer] labs. -Follow-up in 3-3 weeks to [facility] or sooner if any problems arise. In the meantime, encouraged to call the office with any questions, concerns, change in symptoms.   Celesta Gentile, DPM

## 2016-02-15 ENCOUNTER — Ambulatory Visit: Payer: BLUE CROSS/BLUE SHIELD | Admitting: Podiatry

## 2016-03-10 ENCOUNTER — Ambulatory Visit (INDEPENDENT_AMBULATORY_CARE_PROVIDER_SITE_OTHER): Payer: BLUE CROSS/BLUE SHIELD | Admitting: *Deleted

## 2016-03-10 DIAGNOSIS — Q6650 Congenital pes planus, unspecified foot: Secondary | ICD-10-CM

## 2016-03-10 NOTE — Patient Instructions (Signed)

## 2016-03-10 NOTE — Progress Notes (Signed)
Dispensed patient's orthotics with oral and written instructions for wearing. Patient will follow up with Dr. Wagoner in 1 month for an orthotic check.  

## 2016-04-11 ENCOUNTER — Ambulatory Visit: Payer: BLUE CROSS/BLUE SHIELD | Admitting: Podiatry

## 2016-04-20 DIAGNOSIS — J019 Acute sinusitis, unspecified: Secondary | ICD-10-CM | POA: Diagnosis not present

## 2016-04-20 DIAGNOSIS — Z1389 Encounter for screening for other disorder: Secondary | ICD-10-CM | POA: Diagnosis not present

## 2016-04-20 DIAGNOSIS — Z8639 Personal history of other endocrine, nutritional and metabolic disease: Secondary | ICD-10-CM | POA: Diagnosis not present

## 2016-04-20 DIAGNOSIS — Z8349 Family history of other endocrine, nutritional and metabolic diseases: Secondary | ICD-10-CM | POA: Diagnosis not present

## 2016-04-20 DIAGNOSIS — R040 Epistaxis: Secondary | ICD-10-CM | POA: Diagnosis not present

## 2016-04-20 DIAGNOSIS — Z7689 Persons encountering health services in other specified circumstances: Secondary | ICD-10-CM | POA: Diagnosis not present

## 2016-04-21 DIAGNOSIS — M531 Cervicobrachial syndrome: Secondary | ICD-10-CM | POA: Diagnosis not present

## 2016-04-21 DIAGNOSIS — M9902 Segmental and somatic dysfunction of thoracic region: Secondary | ICD-10-CM | POA: Diagnosis not present

## 2016-04-21 DIAGNOSIS — M5387 Other specified dorsopathies, lumbosacral region: Secondary | ICD-10-CM | POA: Diagnosis not present

## 2016-04-27 ENCOUNTER — Encounter: Payer: Self-pay | Admitting: Podiatry

## 2016-04-27 ENCOUNTER — Ambulatory Visit (INDEPENDENT_AMBULATORY_CARE_PROVIDER_SITE_OTHER): Payer: BLUE CROSS/BLUE SHIELD | Admitting: Podiatry

## 2016-04-27 VITALS — BP 90/62 | HR 83 | Resp 18

## 2016-04-27 DIAGNOSIS — M722 Plantar fascial fibromatosis: Secondary | ICD-10-CM | POA: Diagnosis not present

## 2016-04-27 MED ORDER — MELOXICAM 15 MG PO TABS: 15.0000 mg | ORAL_TABLET | Freq: Every day | ORAL | Status: DC

## 2016-04-27 NOTE — Progress Notes (Signed)
Patient ID: Caroline [name], female   DOB: [birth year], 25 y.o.   MRN: [id number]  Subjective: 25 year old female presents the office they for follow-up evaluation of bilateral heel pain. States that her inserts are doing well however she does continue pain to her heels. She has not been stretching, icing or taking any anti-inflammatory medicine. No recent injury or trauma. No swelling or redness. No tingling or numbness. Denies any systemic complaints such as fevers, chills, nausea, vomiting. No acute changes since last appointment, and no other complaints at this time.   Objective: AAO x3, NAD DP/PT pulses palpable bilaterally, CRT less than 3 seconds Tenderness to palpation along the plantar medial tubercle of the calcaneus at the insertion of plantar fascia on the left and right foot. There is no pain along the course of the plantar fascia within the arch of the foot. Plantar fascia appears to be intact. There is no pain with lateral compression of the calcaneus or pain with vibratory sensation. There is no pain along the course or insertion of the achilles tendon. No other areas of tenderness to bilateral lower extremities. No areas of pinpoint bony tenderness or pain with vibratory sensation. MMT 5/5, ROM WNL. No edema, erythema, increase in warmth to bilateral lower extremities.  No open lesions or pre-ulcerative lesions.  No pain with calf compression, swelling, warmth, erythema  Assessment: 25 year old female with bilateral heel pain, likely plantar fasciitis  Plan: -All treatment options discussed with the patient including all alternatives, risks, complications.  -I discussed steroid injection but she declined -She has meloxicam at home and I recommended her to start to use this. Discussed side effects. -Stretching exercises discussed. -Ice daily -Supportive shoe gear -Follow-up 4 weeks -Patient encouraged to call the office with any questions, concerns, change in symptoms.   Celesta Gentile, DPM

## 2016-04-27 NOTE — Patient Instructions (Signed)

## 2016-05-19 DIAGNOSIS — M531 Cervicobrachial syndrome: Secondary | ICD-10-CM | POA: Diagnosis not present

## 2016-05-19 DIAGNOSIS — M9902 Segmental and somatic dysfunction of thoracic region: Secondary | ICD-10-CM | POA: Diagnosis not present

## 2016-05-19 DIAGNOSIS — M9903 Segmental and somatic dysfunction of lumbar region: Secondary | ICD-10-CM | POA: Diagnosis not present

## 2016-05-25 ENCOUNTER — Ambulatory Visit (INDEPENDENT_AMBULATORY_CARE_PROVIDER_SITE_OTHER): Payer: BLUE CROSS/BLUE SHIELD | Admitting: Podiatry

## 2016-05-25 DIAGNOSIS — M722 Plantar fascial fibromatosis: Secondary | ICD-10-CM | POA: Diagnosis not present

## 2016-05-31 DIAGNOSIS — J029 Acute pharyngitis, unspecified: Secondary | ICD-10-CM | POA: Diagnosis not present

## 2016-05-31 DIAGNOSIS — J209 Acute bronchitis, unspecified: Secondary | ICD-10-CM | POA: Diagnosis not present

## 2016-05-31 NOTE — Progress Notes (Signed)
Patient ID: Caroline [name], female   DOB: [birth year], 25 y.o.   MRN: [id number]  Subjective: Caroline [name] presents to the office today for follow-up evaluation of bilateral heel pain. She says that overall she is made much progress. She states feels they are not as tight as what they were she's having no pain at this time. She gets some occasional cramping to her toes. No other complaints.  Objective: General: AAO x3, NAD  Dermatological: Skin is warm, dry and supple bilateral. Nails x 10 are well manicured; remaining integument appears unremarkable at this time. There are no open sores, no preulcerative lesions, no rash or signs of infection present.  Vascular: Dorsalis Pedis artery and Posterior Tibial artery pedal pulses are 2/4 bilateral with immedate capillary fill time. Pedal hair growth present. There is no pain with calf compression, swelling, warmth, erythema.   Neruologic: Grossly intact via light touch bilateral. Vibratory intact via tuning fork bilateral. Protective threshold with Semmes Wienstein monofilament intact to all pedal sites bilateral.   Musculoskeletal: There is no  tenderness palpation along the plantar medial tubercle of the calcaneus at the insertion of the plantar fascia on the left or right foot. There is no pain along the course of the plantar fascia within the arch of the foot. Plantar fascia appears to be intact bilaterally. There is no pain with lateral compression of the calcaneus and there is no pain with vibratory sensation. There is no pain along the course or insertion of the Achilles tendon. There are no other areas of tenderness to bilateral lower extremities. No gross boney pedal deformities bilateral. No pain, crepitus, or limitation noted with foot and ankle range of motion bilateral. Muscular strength 5/5 in all groups tested bilateral.  Gait: Unassisted, Nonantalgic.   Assessment: Presents for follow-up evaluation for heel pain, likely plantar fasciitis    Plan: -Treatment options discussed including all alternatives, risks, and complications -Continue stretching, icing daily. Discussed shoe gear modifications and continue with this. Also discuss orthotics. -Follow-up with symptoms recur.  Celesta Gentile, DPM

## 2016-06-05 DIAGNOSIS — R05 Cough: Secondary | ICD-10-CM | POA: Diagnosis not present

## 2016-06-05 DIAGNOSIS — J029 Acute pharyngitis, unspecified: Secondary | ICD-10-CM | POA: Diagnosis not present

## 2016-06-19 DIAGNOSIS — R05 Cough: Secondary | ICD-10-CM | POA: Diagnosis not present

## 2016-06-19 DIAGNOSIS — J029 Acute pharyngitis, unspecified: Secondary | ICD-10-CM | POA: Diagnosis not present

## 2016-07-14 DIAGNOSIS — M531 Cervicobrachial syndrome: Secondary | ICD-10-CM | POA: Diagnosis not present

## 2016-07-14 DIAGNOSIS — M5441 Lumbago with sciatica, right side: Secondary | ICD-10-CM | POA: Diagnosis not present

## 2016-07-14 DIAGNOSIS — M5386 Other specified dorsopathies, lumbar region: Secondary | ICD-10-CM | POA: Diagnosis not present

## 2016-07-14 DIAGNOSIS — M5442 Lumbago with sciatica, left side: Secondary | ICD-10-CM | POA: Diagnosis not present

## 2016-07-27 DIAGNOSIS — E039 Hypothyroidism, unspecified: Secondary | ICD-10-CM | POA: Diagnosis not present

## 2016-07-27 DIAGNOSIS — E559 Vitamin D deficiency, unspecified: Secondary | ICD-10-CM | POA: Diagnosis not present

## 2016-07-27 DIAGNOSIS — H1031 Unspecified acute conjunctivitis, right eye: Secondary | ICD-10-CM | POA: Diagnosis not present

## 2016-08-11 DIAGNOSIS — M9901 Segmental and somatic dysfunction of cervical region: Secondary | ICD-10-CM | POA: Diagnosis not present

## 2016-08-11 DIAGNOSIS — M5386 Other specified dorsopathies, lumbar region: Secondary | ICD-10-CM | POA: Diagnosis not present

## 2016-08-11 DIAGNOSIS — M531 Cervicobrachial syndrome: Secondary | ICD-10-CM | POA: Diagnosis not present

## 2016-08-31 DIAGNOSIS — E7800 Pure hypercholesterolemia, unspecified: Secondary | ICD-10-CM | POA: Diagnosis not present

## 2016-08-31 DIAGNOSIS — K639 Disease of intestine, unspecified: Secondary | ICD-10-CM | POA: Diagnosis not present

## 2016-08-31 DIAGNOSIS — E559 Vitamin D deficiency, unspecified: Secondary | ICD-10-CM | POA: Diagnosis not present

## 2016-08-31 DIAGNOSIS — R5383 Other fatigue: Secondary | ICD-10-CM | POA: Diagnosis not present

## 2016-08-31 DIAGNOSIS — E039 Hypothyroidism, unspecified: Secondary | ICD-10-CM | POA: Diagnosis not present

## 2016-09-29 DIAGNOSIS — M9903 Segmental and somatic dysfunction of lumbar region: Secondary | ICD-10-CM | POA: Diagnosis not present

## 2016-09-29 DIAGNOSIS — M531 Cervicobrachial syndrome: Secondary | ICD-10-CM | POA: Diagnosis not present

## 2016-09-29 DIAGNOSIS — M9902 Segmental and somatic dysfunction of thoracic region: Secondary | ICD-10-CM | POA: Diagnosis not present

## 2016-10-23 DIAGNOSIS — Z1283 Encounter for screening for malignant neoplasm of skin: Secondary | ICD-10-CM | POA: Diagnosis not present

## 2016-10-23 DIAGNOSIS — Q828 Other specified congenital malformations of skin: Secondary | ICD-10-CM | POA: Diagnosis not present

## 2016-10-23 DIAGNOSIS — D229 Melanocytic nevi, unspecified: Secondary | ICD-10-CM | POA: Diagnosis not present

## 2016-10-23 DIAGNOSIS — L718 Other rosacea: Secondary | ICD-10-CM | POA: Diagnosis not present

## 2016-10-23 DIAGNOSIS — D485 Neoplasm of uncertain behavior of skin: Secondary | ICD-10-CM | POA: Diagnosis not present

## 2016-10-23 DIAGNOSIS — D225 Melanocytic nevi of trunk: Secondary | ICD-10-CM | POA: Diagnosis not present

## 2016-11-02 DIAGNOSIS — E559 Vitamin D deficiency, unspecified: Secondary | ICD-10-CM | POA: Diagnosis not present

## 2016-11-02 DIAGNOSIS — J209 Acute bronchitis, unspecified: Secondary | ICD-10-CM | POA: Diagnosis not present

## 2016-11-02 DIAGNOSIS — J019 Acute sinusitis, unspecified: Secondary | ICD-10-CM | POA: Diagnosis not present

## 2016-11-10 DIAGNOSIS — M9901 Segmental and somatic dysfunction of cervical region: Secondary | ICD-10-CM | POA: Diagnosis not present

## 2016-11-10 DIAGNOSIS — M9903 Segmental and somatic dysfunction of lumbar region: Secondary | ICD-10-CM | POA: Diagnosis not present

## 2016-11-10 DIAGNOSIS — M9902 Segmental and somatic dysfunction of thoracic region: Secondary | ICD-10-CM | POA: Diagnosis not present

## 2016-11-15 ENCOUNTER — Telehealth: Payer: Self-pay | Admitting: Neurology

## 2016-11-15 DIAGNOSIS — E348 Other specified endocrine disorders: Secondary | ICD-10-CM

## 2016-11-15 NOTE — Telephone Encounter (Signed)
Caroline [name], we had discussed repeating the MRI of the brain for patient to follow the pineal cyst in one year.  Please let me know if patient would like to continue.   (note, need w/wo and MS protocol) thanks

## 2016-11-20 NOTE — Addendum Note (Signed)
Addended by: Hope Pigeon on: 11/20/2016 09:43 AM   Modules accepted: Orders

## 2016-11-20 NOTE — Telephone Encounter (Signed)
Called and spoke to pt. She is agreeable to have repeat MRI brain as recommended by AA,MD. Advised we will place order and someone will call her to schedule within a week or two. If not, advised her to call me. She verbalized understanding.   Placed order in EPIC per AA,MD request.

## 2016-11-29 ENCOUNTER — Telehealth: Payer: Self-pay | Admitting: Neurology

## 2016-11-29 NOTE — Telephone Encounter (Signed)
Awaiting AA,MD signature on MRI order and will fax as requested.

## 2016-11-29 NOTE — Telephone Encounter (Signed)
[facility] [phone] called requesting RX for MRI to be faxed to [phone]. This is a service program thru [employer].

## 2016-11-30 NOTE — Telephone Encounter (Signed)
Faxed signed order as requested to number below. Received confirmation.

## 2016-12-02 ENCOUNTER — Other Ambulatory Visit: Payer: BLUE CROSS/BLUE SHIELD

## 2016-12-05 ENCOUNTER — Ambulatory Visit (INDEPENDENT_AMBULATORY_CARE_PROVIDER_SITE_OTHER): Payer: Self-pay

## 2016-12-05 DIAGNOSIS — E348 Other specified endocrine disorders: Secondary | ICD-10-CM | POA: Diagnosis not present

## 2016-12-05 DIAGNOSIS — Z0289 Encounter for other administrative examinations: Secondary | ICD-10-CM

## 2016-12-08 DIAGNOSIS — J209 Acute bronchitis, unspecified: Secondary | ICD-10-CM | POA: Diagnosis not present

## 2016-12-08 DIAGNOSIS — J019 Acute sinusitis, unspecified: Secondary | ICD-10-CM | POA: Diagnosis not present

## 2016-12-11 ENCOUNTER — Telehealth: Payer: Self-pay | Admitting: *Deleted

## 2016-12-11 NOTE — Telephone Encounter (Signed)
-----   Message from Melvenia Beam, MD sent at 12/10/2016  6:49 PM EST ----- MRI of the brain is stable, no changes which is great news. At this point I would only repeat imaging every few years or if she has any symptoms thanks

## 2016-12-11 NOTE — Telephone Encounter (Signed)
Called and spoke to patient about imaging results per AA,MD note. Pt verbalized understanding and has no further questions.

## 2016-12-15 DIAGNOSIS — M9902 Segmental and somatic dysfunction of thoracic region: Secondary | ICD-10-CM | POA: Diagnosis not present

## 2016-12-15 DIAGNOSIS — M9903 Segmental and somatic dysfunction of lumbar region: Secondary | ICD-10-CM | POA: Diagnosis not present

## 2016-12-15 DIAGNOSIS — M9901 Segmental and somatic dysfunction of cervical region: Secondary | ICD-10-CM | POA: Diagnosis not present

## 2016-12-15 DIAGNOSIS — M531 Cervicobrachial syndrome: Secondary | ICD-10-CM | POA: Diagnosis not present

## 2017-01-08 DIAGNOSIS — J209 Acute bronchitis, unspecified: Secondary | ICD-10-CM | POA: Diagnosis not present

## 2017-01-08 DIAGNOSIS — B349 Viral infection, unspecified: Secondary | ICD-10-CM | POA: Diagnosis not present

## 2017-01-08 DIAGNOSIS — J019 Acute sinusitis, unspecified: Secondary | ICD-10-CM | POA: Diagnosis not present

## 2017-01-19 DIAGNOSIS — M9901 Segmental and somatic dysfunction of cervical region: Secondary | ICD-10-CM | POA: Diagnosis not present

## 2017-01-19 DIAGNOSIS — M531 Cervicobrachial syndrome: Secondary | ICD-10-CM | POA: Diagnosis not present

## 2017-01-19 DIAGNOSIS — M25632 Stiffness of left wrist, not elsewhere classified: Secondary | ICD-10-CM | POA: Diagnosis not present

## 2017-01-19 DIAGNOSIS — M25631 Stiffness of right wrist, not elsewhere classified: Secondary | ICD-10-CM | POA: Diagnosis not present

## 2017-02-28 DIAGNOSIS — J209 Acute bronchitis, unspecified: Secondary | ICD-10-CM | POA: Diagnosis not present

## 2017-02-28 DIAGNOSIS — J019 Acute sinusitis, unspecified: Secondary | ICD-10-CM | POA: Diagnosis not present

## 2017-02-28 DIAGNOSIS — R5383 Other fatigue: Secondary | ICD-10-CM | POA: Diagnosis not present

## 2017-02-28 DIAGNOSIS — E559 Vitamin D deficiency, unspecified: Secondary | ICD-10-CM | POA: Diagnosis not present

## 2017-03-16 DIAGNOSIS — M5386 Other specified dorsopathies, lumbar region: Secondary | ICD-10-CM | POA: Diagnosis not present

## 2017-03-16 DIAGNOSIS — M531 Cervicobrachial syndrome: Secondary | ICD-10-CM | POA: Diagnosis not present

## 2017-03-16 DIAGNOSIS — M9902 Segmental and somatic dysfunction of thoracic region: Secondary | ICD-10-CM | POA: Diagnosis not present

## 2017-03-16 DIAGNOSIS — M9901 Segmental and somatic dysfunction of cervical region: Secondary | ICD-10-CM | POA: Diagnosis not present

## 2017-04-20 DIAGNOSIS — M531 Cervicobrachial syndrome: Secondary | ICD-10-CM | POA: Diagnosis not present

## 2017-04-20 DIAGNOSIS — M25631 Stiffness of right wrist, not elsewhere classified: Secondary | ICD-10-CM | POA: Diagnosis not present

## 2017-04-20 DIAGNOSIS — M9901 Segmental and somatic dysfunction of cervical region: Secondary | ICD-10-CM | POA: Diagnosis not present

## 2017-04-20 DIAGNOSIS — M5387 Other specified dorsopathies, lumbosacral region: Secondary | ICD-10-CM | POA: Diagnosis not present

## 2017-05-07 DIAGNOSIS — H01002 Unspecified blepharitis right lower eyelid: Secondary | ICD-10-CM | POA: Diagnosis not present

## 2017-05-07 DIAGNOSIS — J309 Allergic rhinitis, unspecified: Secondary | ICD-10-CM | POA: Diagnosis not present

## 2017-05-17 ENCOUNTER — Ambulatory Visit (INDEPENDENT_AMBULATORY_CARE_PROVIDER_SITE_OTHER): Payer: BLUE CROSS/BLUE SHIELD | Admitting: Family Medicine

## 2017-05-17 ENCOUNTER — Encounter: Payer: Self-pay | Admitting: Family Medicine

## 2017-05-17 VITALS — BP 100/60 | HR 84 | Temp 98.4°F | Ht 67.0 in | Wt 147.5 lb

## 2017-05-17 DIAGNOSIS — N920 Excessive and frequent menstruation with regular cycle: Secondary | ICD-10-CM

## 2017-05-17 DIAGNOSIS — K5909 Other constipation: Secondary | ICD-10-CM | POA: Diagnosis not present

## 2017-05-17 DIAGNOSIS — E559 Vitamin D deficiency, unspecified: Secondary | ICD-10-CM

## 2017-05-17 DIAGNOSIS — K599 Functional intestinal disorder, unspecified: Secondary | ICD-10-CM | POA: Diagnosis not present

## 2017-05-17 DIAGNOSIS — R7989 Other specified abnormal findings of blood chemistry: Secondary | ICD-10-CM | POA: Diagnosis not present

## 2017-05-17 DIAGNOSIS — E348 Other specified endocrine disorders: Secondary | ICD-10-CM | POA: Diagnosis not present

## 2017-05-17 DIAGNOSIS — N939 Abnormal uterine and vaginal bleeding, unspecified: Secondary | ICD-10-CM | POA: Insufficient documentation

## 2017-05-17 DIAGNOSIS — E538 Deficiency of other specified B group vitamins: Secondary | ICD-10-CM

## 2017-05-17 DIAGNOSIS — J302 Other seasonal allergic rhinitis: Secondary | ICD-10-CM | POA: Diagnosis not present

## 2017-05-17 NOTE — Assessment & Plan Note (Signed)
Controlled with PRN zyrtec.

## 2017-05-17 NOTE — Assessment & Plan Note (Addendum)
See above - now managed with miralax a few times a week.

## 2017-05-17 NOTE — Patient Instructions (Addendum)
Good to meet you today Labwork at your convenience at [facility] are doing well today. Return as needed or in 2 yrs for physical. Bring [place] records from Dr Hardin Negus to review.

## 2017-05-17 NOTE — Progress Notes (Signed)
BP 100/60   Pulse 84   Temp 98.4 F (36.9 C)   Ht 5\' 7"  (1.702 m)   Wt 147 lb 8 oz (66.9 kg)   SpO2 94%   BMI 23.10 kg/m    CC: new pt to establish care Subjective:    Patient ID: Caroline [name], female    DOB: [birth year], 26 y.o.   MRN: [id number]  HPI: Caroline [name] is a 26 y.o. female presenting on 05/17/2017 for Establish Care (new patient)   Last saw Meliton Rattan - bad experience trying to get in for appointments. Prior saw Dr Brunetta Genera.  Before this saw Dr Laurian Brim. She will bring records from Dr Hardin Negus for [place] to review.   H/o vit D deficiency. She did receive Rx strength dosing.   H/o colonic inertia - chronic issue since 2008. Sees Dr [name] - last 2010. Referred to Dr Mellody Drown in [place] - recommended continue miralax a few times a week. High fiber diet, good water. More trouble around cycles. Last BM this morning, small.   Regular periods. Over last few months has second episode of light spotting after period.  LMP - 05/06/2017. Off OCP since age 81yo.   Preventative: No recent CPE  Caffeine use: 1 cup coffee/day and some tea Lives at home with mother and sister and pets (horses, ducks, chickens, fish, wild [place]) Occ: Neurosurgeon, also student Edu: bachelor's biochem [employer] and studying [employer] computer programming Activity: no regular exercise Diet: good water, fruits/vegetables daily  Relevant past medical, surgical, family and social history reviewed and updated as indicated. Interim medical history since our last visit reviewed. Allergies and medications reviewed and updated. Outpatient Medications Prior to Visit  Medication Sig Dispense Refill  . polyethylene glycol (MIRALAX / GLYCOLAX) packet Take 17 g by mouth daily.    . meloxicam (MOBIC) 15 MG tablet Take 1 tablet (15 mg total) by mouth daily. 30 tablet 1   No facility-administered medications prior to visit.      Per HPI unless specifically indicated in ROS section  below Review of Systems     Objective:    BP 100/60   Pulse 84   Temp 98.4 F (36.9 C)   Ht 5\' 7"  (1.702 m)   Wt 147 lb 8 oz (66.9 kg)   SpO2 94%   BMI 23.10 kg/m   Wt Readings from Last 3 Encounters:  05/17/17 147 lb 8 oz (66.9 kg)  01/31/16 136 lb 6.4 oz (61.9 kg)  12/08/15 137 lb 9.6 oz (62.4 kg)    Physical Exam  Constitutional: She appears well-developed and well-nourished. No distress.  HENT:  Head: Normocephalic and atraumatic.  Mouth/Throat: Oropharynx is clear and moist. No oropharyngeal exudate.  Eyes: Conjunctivae and EOM are normal. Pupils are equal, round, and reactive to light.  Neck: Normal range of motion. Neck supple. No thyromegaly present.  Cardiovascular: Normal rate, regular rhythm, normal heart sounds and intact distal pulses.   No murmur heard. Pulmonary/Chest: Effort normal and breath sounds normal. No respiratory distress. She has no wheezes. She has no rales.  Abdominal: Soft. Bowel sounds are normal. She exhibits no distension and no mass. There is no tenderness. There is no rebound and no guarding.  Musculoskeletal: She exhibits no edema.  Lymphadenopathy:    She has no cervical adenopathy.  Skin: Skin is warm and dry. No rash noted.  Psychiatric: She has a normal mood and affect.  Nursing note and vitals reviewed.  Results  for orders placed or performed in visit on 12/08/15  CK  Result Value Ref Range   Total CK 38 24 - 173 U/L  TSH  Result Value Ref Range   TSH 2.150 0.450 - 4.500 uIU/mL  HIV antibody  Result Value Ref Range   HIV Screen 4th Generation wRfx Non Reactive Non Reactive  ANA w/Reflex  Result Value Ref Range   Anit Nuclear Antibody(ANA) Negative Negative  B12 and Folate Panel  Result Value Ref Range   Vitamin B-12 305 211 - 946 pg/mL   Folate 10.0 >3.0 ng/mL  RPR  Result Value Ref Range   RPR Ser Ql Non Reactive Non Reactive  Hepatitis C antibody  Result Value Ref Range   Hep C Virus Ab <0.1 0.0 - 0.9 s/co ratio    Tissue transglutaminase, IgA  Result Value Ref Range   Transglutaminase IgA <2 0 - 3 U/mL  Gliadin antibodies, serum  Result Value Ref Range   Antigliadin Abs, IgA 1 0 - 19 units   Gliadin IgG 2 0 - 19 units  Rheumatoid factor  Result Value Ref Range   Rhuematoid fact SerPl-aCnc <10.0 0.0 - 13.9 IU/mL  Heavy metals, blood  Result Value Ref Range   Lead, Blood None Detected 0 - 19 ug/dL   Arsenic 4 2 - 23 ug/L   Mercury None Detected 0.0 - 14.9 ug/L  Comprehensive metabolic panel  Result Value Ref Range   Glucose 83 65 - 99 mg/dL   BUN 8 6 - 20 mg/dL   Creatinine, Ser 0.63 0.57 - 1.00 mg/dL   GFR calc non Af Amer 126 >59 mL/min/1.73   GFR calc Af Amer 145 >59 mL/min/1.73   BUN/Creatinine Ratio 13 8 - 20   Sodium 138 134 - 144 mmol/L   Potassium 3.9 3.5 - 5.2 mmol/L   Chloride 100 96 - 106 mmol/L   CO2 26 18 - 29 mmol/L   Calcium 9.5 8.7 - 10.2 mg/dL   Total Protein 6.6 6.0 - 8.5 g/dL   Albumin 4.6 3.5 - 5.5 g/dL   Globulin, Total 2.0 1.5 - 4.5 g/dL   Albumin/Globulin Ratio 2.3 1.1 - 2.5   Bilirubin Total 0.5 0.0 - 1.2 mg/dL   Alkaline Phosphatase 57 39 - 117 IU/L   AST 15 0 - 40 IU/L   ALT 6 0 - 32 IU/L  Hemoglobin A1c  Result Value Ref Range   Hgb A1c MFr Bld 5.5 4.8 - 5.6 %   Est. average glucose Bld gHb Est-mCnc 111 mg/dL      Assessment & Plan:   Problem List Items Addressed This Visit    Colonic inertia - Primary    Chronic issue. Has seen GI specialists including subspecialist at [facility] (Dr Darrick Grinder who is now in [place]). No cause found, now managed with miralax a few times a week, stable on this.       Relevant Orders   Basic metabolic panel   TSH   T4, free   CONSTIPATION, CHRONIC    See above - now managed with miralax a few times a week.       Pineal gland cyst    Reviewed neurology note from Dr [name] - likely incidental finding.      Seasonal allergic rhinitis    Controlled with PRN zyrtec.       Vaginal spotting    Vaginal  spotting a few days after cycle. Anticipate benign cause. Will have pt monitor this and  update [place] if worsening or changing symptoms. Will need to review cervical cancer screening at f/u.       Vitamin D deficiency    Update level next labs.       Relevant Orders   VITAMIN D 25 Hydroxy (Vit-D Deficiency, Fractures)    Other Visit Diagnoses    Low serum vitamin B12       Relevant Orders   Vitamin B12       Follow up plan: Return for annual exam, prior fasting for blood work.  [name], MD

## 2017-05-17 NOTE — Assessment & Plan Note (Signed)
Reviewed neurology note from Dr [name] - likely incidental finding.

## 2017-05-17 NOTE — Assessment & Plan Note (Signed)
Update level next labs.

## 2017-05-17 NOTE — Assessment & Plan Note (Signed)
Chronic issue. Has seen GI specialists including subspecialist at [facility] (Dr Darrick Grinder who is now in [place]). No cause found, now managed with miralax a few times a week, stable on this.

## 2017-05-17 NOTE — Assessment & Plan Note (Signed)
Vaginal spotting a few days after cycle. Anticipate benign cause. Will have pt monitor this and update [place] if worsening or changing symptoms. Will need to review cervical cancer screening at f/u.

## 2017-05-17 NOTE — Addendum Note (Signed)
Addended by: Frutoso Chase A on: 05/17/2017 05:01 PM   Modules accepted: Orders

## 2017-05-23 DIAGNOSIS — M531 Cervicobrachial syndrome: Secondary | ICD-10-CM | POA: Diagnosis not present

## 2017-05-23 DIAGNOSIS — M5387 Other specified dorsopathies, lumbosacral region: Secondary | ICD-10-CM | POA: Diagnosis not present

## 2017-05-23 DIAGNOSIS — M9901 Segmental and somatic dysfunction of cervical region: Secondary | ICD-10-CM | POA: Diagnosis not present

## 2017-05-23 DIAGNOSIS — M25631 Stiffness of right wrist, not elsewhere classified: Secondary | ICD-10-CM | POA: Diagnosis not present

## 2017-06-07 DIAGNOSIS — K599 Functional intestinal disorder, unspecified: Secondary | ICD-10-CM | POA: Diagnosis not present

## 2017-06-07 DIAGNOSIS — R7989 Other specified abnormal findings of blood chemistry: Secondary | ICD-10-CM | POA: Diagnosis not present

## 2017-06-07 DIAGNOSIS — E559 Vitamin D deficiency, unspecified: Secondary | ICD-10-CM | POA: Diagnosis not present

## 2017-06-08 LAB — BASIC METABOLIC PANEL
BUN / CREAT RATIO: 10 (ref 9–23)
BUN: 7 mg/dL (ref 6–20)
CHLORIDE: 103 mmol/L (ref 96–106)
CO2: 24 mmol/L (ref 20–29)
Calcium: 9.8 mg/dL (ref 8.7–10.2)
Creatinine, Ser: 0.69 mg/dL (ref 0.57–1.00)
GFR calc Af Amer: 140 mL/min/{1.73_m2} (ref >59)
GFR calc non Af Amer: 121 mL/min/{1.73_m2} (ref >59)
GLUCOSE: 81 mg/dL (ref 65–99)
POTASSIUM: 4.4 mmol/L (ref 3.5–5.2)
SODIUM: 141 mmol/L (ref 134–144)

## 2017-06-08 LAB — TSH: TSH: 2.26 u[IU]/mL (ref 0.450–4.500)

## 2017-06-08 LAB — VITAMIN B12: VITAMIN B 12: 327 pg/mL (ref 232–1245)

## 2017-06-08 LAB — VITAMIN D 25 HYDROXY (VIT D DEFICIENCY, FRACTURES): VIT D 25 HYDROXY: 26.6 ng/mL — AB (ref 30.0–100.0)

## 2017-06-08 LAB — T4, FREE: FREE T4: 1.45 ng/dL (ref 0.82–1.77)

## 2017-06-10 ENCOUNTER — Other Ambulatory Visit: Payer: Self-pay | Admitting: Family Medicine

## 2017-06-10 MED ORDER — VITAMIN D 50 MCG (2000 UT) PO CAPS: 2000.0000 [IU] | ORAL_CAPSULE | Freq: Every day | ORAL | Status: DC | PRN

## 2017-06-10 MED ORDER — VITAMIN D 50 MCG (2000 UT) PO CAPS: 2000.0000 [IU] | ORAL_CAPSULE | Freq: Every day | ORAL | Status: DC

## 2017-06-10 MED ORDER — VITAMIN B-12 1000 MCG PO TABS: 1000.0000 ug | ORAL_TABLET | Freq: Every day | ORAL | Status: DC

## 2017-06-22 DIAGNOSIS — M5387 Other specified dorsopathies, lumbosacral region: Secondary | ICD-10-CM | POA: Diagnosis not present

## 2017-06-22 DIAGNOSIS — M9903 Segmental and somatic dysfunction of lumbar region: Secondary | ICD-10-CM | POA: Diagnosis not present

## 2017-06-22 DIAGNOSIS — M531 Cervicobrachial syndrome: Secondary | ICD-10-CM | POA: Diagnosis not present

## 2017-06-22 DIAGNOSIS — M9901 Segmental and somatic dysfunction of cervical region: Secondary | ICD-10-CM | POA: Diagnosis not present

## 2017-07-23 ENCOUNTER — Encounter: Payer: Self-pay | Admitting: Family Medicine

## 2017-07-23 ENCOUNTER — Ambulatory Visit (INDEPENDENT_AMBULATORY_CARE_PROVIDER_SITE_OTHER): Payer: BLUE CROSS/BLUE SHIELD | Admitting: Family Medicine

## 2017-07-23 VITALS — BP 112/70 | HR 90 | Temp 97.9°F | Wt 145.0 lb

## 2017-07-23 DIAGNOSIS — J019 Acute sinusitis, unspecified: Secondary | ICD-10-CM | POA: Diagnosis not present

## 2017-07-23 MED ORDER — AMOXICILLIN-POT CLAVULANATE 875-125 MG PO TABS: 1.0000 | ORAL_TABLET | Freq: Two times a day (BID) | ORAL | 0 refills | Status: AC

## 2017-07-23 NOTE — Patient Instructions (Signed)
You have a sinus infection. Take medicine as prescribed: augmentin 10 day course. Push fluids and plenty of rest. Nasal saline irrigation or neti pot to help drain sinuses. Ibuprofen 400mg  with meals for sinus inflammation May use plain mucinex with plenty of fluid to help mobilize mucous. Please let us know if fever >101.5, trouble opening/closing mouth, difficulty swallowing, or worsening instead of improving as expected.   Sinusitis, Adult Sinusitis is soreness and inflammation of your sinuses. Sinuses are hollow spaces in the bones around your face. Your sinuses are located:  Around your eyes.  In the middle of your forehead.  Behind your nose.  In your cheekbones.  Your sinuses and nasal passages are lined with a stringy fluid (mucus). Mucus normally drains out of your sinuses. When your nasal tissues become inflamed or swollen, the mucus can become trapped or blocked so air cannot flow through your sinuses. This allows bacteria, viruses, and funguses to grow, which leads to infection. Sinusitis can develop quickly and last for 7?10 days (acute) or for more than 12 weeks (chronic). Sinusitis often develops after a cold. What are the causes? This condition is caused by anything that creates swelling in the sinuses or stops mucus from draining, including:  Allergies.  Asthma.  Bacterial or viral infection.  Abnormally shaped bones between the nasal passages.  Nasal growths that contain mucus (nasal polyps).  Narrow sinus openings.  Pollutants, such as chemicals or irritants in the air.  A foreign object stuck in the nose.  A fungal infection. This is rare.  What increases the risk? The following factors may make you more likely to develop this condition:  Having allergies or asthma.  Having had a recent cold or respiratory tract infection.  Having structural deformities or blockages in your nose or sinuses.  Having a weak immune system.  Doing a lot of swimming  or diving.  Overusing nasal sprays.  Smoking.  What are the signs or symptoms? The main symptoms of this condition are pain and a feeling of pressure around the affected sinuses. Other symptoms include:  Upper toothache.  Earache.  Headache.  Bad breath.  Decreased sense of smell and taste.  A cough that may get worse at night.  Fatigue.  Fever.  Thick drainage from your nose. The drainage is often green and it may contain pus (purulent).  Stuffy nose or congestion.  Postnasal drip. This is when extra mucus collects in the throat or back of the nose.  Swelling and warmth over the affected sinuses.  Sore throat.  Sensitivity to light.  How is this diagnosed? This condition is diagnosed based on symptoms, a medical history, and a physical exam. To find out if your condition is acute or chronic, your health care provider may:  Look in your nose for signs of nasal polyps.  Tap over the affected sinus to check for signs of infection.  View the inside of your sinuses using an imaging device that has a light attached (endoscope).  If your health care provider suspects that you have chronic sinusitis, you may also:  Be tested for allergies.  Have a sample of mucus taken from your nose (nasal culture) and checked for bacteria.  Have a mucus sample examined to see if your sinusitis is related to an allergy.  If your sinusitis does not respond to treatment and it lasts longer than 8 weeks, you may have an MRI or CT scan to check your sinuses. These scans also help to determine how  severe your infection is. In rare cases, a bone biopsy may be done to rule out more serious types of fungal sinus disease. How is this treated? Treatment for sinusitis depends on the cause and whether your condition is chronic or acute. If a virus is causing your sinusitis, your symptoms will go away on their own within 10 days. You may be given medicines to relieve your symptoms,  including:  Topical nasal decongestants. They shrink swollen nasal passages and let mucus drain from your sinuses.  Antihistamines. These drugs block inflammation that is triggered by allergies. This can help to ease swelling in your nose and sinuses.  Topical nasal corticosteroids. These are nasal sprays that ease inflammation and swelling in your nose and sinuses.  Nasal saline washes. These rinses can help to get rid of thick mucus in your nose.  If your condition is caused by bacteria, you will be given an antibiotic medicine. If your condition is caused by a fungus, you will be given an antifungal medicine. Surgery may be needed to correct underlying conditions, such as narrow nasal passages. Surgery may also be needed to remove polyps. Follow these instructions at home: Medicines  Take, use, or apply over-the-counter and prescription medicines only as told by your health care provider. These may include nasal sprays.  If you were prescribed an antibiotic medicine, take it as told by your health care provider. Do not stop taking the antibiotic even if you start to feel better. Hydrate and Humidify  Drink enough water to keep your urine clear or pale yellow. Staying hydrated will help to thin your mucus.  Use a cool mist humidifier to keep the humidity level in your home above 50%.  Inhale steam for 10-15 minutes, 3-4 times a day or as told by your health care provider. You can do this in the bathroom while a hot shower is running.  Limit your exposure to cool or dry air. Rest  Rest as much as possible.  Sleep with your head raised (elevated).  Make sure to get enough sleep each night. General instructions  Apply a warm, moist washcloth to your face 3-4 times a day or as told by your health care provider. This will help with discomfort.  Wash your hands often with soap and water to reduce your exposure to viruses and other germs. If soap and water are not available, use hand  sanitizer.  Do not smoke. Avoid being around people who are smoking (secondhand smoke).  Keep all follow-up visits as told by your health care provider. This is important. Contact a health care provider if:  You have a fever.  Your symptoms get worse.  Your symptoms do not improve within 10 days. Get help right away if:  You have a severe headache.  You have persistent vomiting.  You have pain or swelling around your face or eyes.  You have vision problems.  You develop confusion.  Your neck is stiff.  You have trouble breathing. This information is not intended to replace advice given to you by your health care provider. Make sure you discuss any questions you have with your health care provider. Document Released: 12/11/2005 Document Revised: 08/06/2016 Document Reviewed: 10/06/2015 Elsevier Interactive Patient Education  2017 [employer].

## 2017-07-23 NOTE — Progress Notes (Signed)
BP 112/70   Pulse 90   Temp 97.9 F (36.6 C) (Oral)   Wt 145 lb (65.8 kg)   LMP 06/25/2017   SpO2 98%   BMI 22.71 kg/m    CC: cough, chest congestion "I think I have a sinus infection" Subjective:    Patient ID: Caroline [name], female    DOB: [birth year], 26 y.o.   MRN: [id number]  HPI: Caroline [name] is a 26 y.o. female presenting on 07/23/2017 for Cough and congestion in chest   2wk h/o nasal congestion, sinus inflammation, lungs feel tight, sinus headache and hacking cough over last 3 days. Waking up with purulent mucous in the mornings, sore dry throat, PNdrainage. Tooth pain.   No fevers/chills, ear pain.   Hasn't tried anything other than ibuprofen.  No sick contacts.  No h/o asthma.  Non smoker. Mother smokes indoors.   Relevant past medical, surgical, family and social history reviewed and updated as indicated. Interim medical history since our last visit reviewed. Allergies and medications reviewed and updated. Outpatient Medications Prior to Visit  Medication Sig Dispense Refill  . cetirizine (ZYRTEC) 10 MG tablet Take 10 mg by mouth daily as needed for allergies.    . Cholecalciferol (VITAMIN D) 2000 units CAPS Take 1 capsule (2,000 Units total) by mouth daily. 30 capsule   . polyethylene glycol (MIRALAX / GLYCOLAX) packet Take 17 g by mouth daily.    . vitamin B-12 (CYANOCOBALAMIN) 1000 MCG tablet Take 1 tablet (1,000 mcg total) by mouth daily.     No facility-administered medications prior to visit.      Per HPI unless specifically indicated in ROS section below Review of Systems     Objective:    BP 112/70   Pulse 90   Temp 97.9 F (36.6 C) (Oral)   Wt 145 lb (65.8 kg)   LMP 06/25/2017   SpO2 98%   BMI 22.71 kg/m   Wt Readings from Last 3 Encounters:  07/23/17 145 lb (65.8 kg)  05/17/17 147 lb 8 oz (66.9 kg)  01/31/16 136 lb 6.4 oz (61.9 kg)    Physical Exam  Constitutional: She appears well-developed and well-nourished. No distress.  HENT:    Head: Normocephalic and atraumatic.  Right Ear: Hearing, tympanic membrane, external ear and ear canal normal.  Left Ear: Hearing, tympanic membrane, external ear and ear canal normal.  Nose: Mucosal edema and rhinorrhea present. Right sinus exhibits no maxillary sinus tenderness and no frontal sinus tenderness. Left sinus exhibits no maxillary sinus tenderness and no frontal sinus tenderness.  Mouth/Throat: Uvula is midline, oropharynx is clear and moist and mucous membranes are normal. No oropharyngeal exudate, posterior oropharyngeal edema, posterior oropharyngeal erythema or tonsillar abscesses.  Nasal mucosal congestion with injection/inflammation  Eyes: Pupils are equal, round, and reactive to light. Conjunctivae and EOM are normal. No scleral icterus.  Neck: Normal range of motion. Neck supple.  Cardiovascular: Normal rate, regular rhythm, normal heart sounds and intact distal pulses.   No murmur heard. Pulmonary/Chest: Effort normal and breath sounds normal. No respiratory distress. She has no wheezes. She has no rales.  Lymphadenopathy:    She has no cervical adenopathy.  Skin: Skin is warm and dry. No rash noted.  Nursing note and vitals reviewed.      Assessment & Plan:   Problem List Items Addressed This Visit    Acute sinusitis - Primary    Treat with augmentin course given progression and duration of symptoms. Further supportive care reviewed.  Pt agrees with plan.       Relevant Medications   amoxicillin-clavulanate (AUGMENTIN) 875-125 MG tablet       Follow up plan: Return if symptoms worsen or fail to improve.  [name], MD

## 2017-07-23 NOTE — Assessment & Plan Note (Signed)
Treat with augmentin course given progression and duration of symptoms. Further supportive care reviewed. Pt agrees with plan.

## 2017-07-24 DIAGNOSIS — M531 Cervicobrachial syndrome: Secondary | ICD-10-CM | POA: Diagnosis not present

## 2017-07-24 DIAGNOSIS — M9901 Segmental and somatic dysfunction of cervical region: Secondary | ICD-10-CM | POA: Diagnosis not present

## 2017-07-24 DIAGNOSIS — M9902 Segmental and somatic dysfunction of thoracic region: Secondary | ICD-10-CM | POA: Diagnosis not present

## 2017-07-24 DIAGNOSIS — M5386 Other specified dorsopathies, lumbar region: Secondary | ICD-10-CM | POA: Diagnosis not present

## 2017-08-17 DIAGNOSIS — H6980 Other specified disorders of Eustachian tube, unspecified ear: Secondary | ICD-10-CM | POA: Diagnosis not present

## 2017-08-17 DIAGNOSIS — H6500 Acute serous otitis media, unspecified ear: Secondary | ICD-10-CM | POA: Diagnosis not present

## 2017-08-17 DIAGNOSIS — H9011 Conductive hearing loss, unilateral, right ear, with unrestricted hearing on the contralateral side: Secondary | ICD-10-CM | POA: Diagnosis not present

## 2017-08-24 DIAGNOSIS — M9902 Segmental and somatic dysfunction of thoracic region: Secondary | ICD-10-CM | POA: Diagnosis not present

## 2017-08-24 DIAGNOSIS — M9901 Segmental and somatic dysfunction of cervical region: Secondary | ICD-10-CM | POA: Diagnosis not present

## 2017-08-24 DIAGNOSIS — S638X1A Sprain of other part of right wrist and hand, initial encounter: Secondary | ICD-10-CM | POA: Diagnosis not present

## 2017-08-24 DIAGNOSIS — M531 Cervicobrachial syndrome: Secondary | ICD-10-CM | POA: Diagnosis not present

## 2017-09-21 DIAGNOSIS — G5602 Carpal tunnel syndrome, left upper limb: Secondary | ICD-10-CM | POA: Diagnosis not present

## 2017-09-21 DIAGNOSIS — M5386 Other specified dorsopathies, lumbar region: Secondary | ICD-10-CM | POA: Diagnosis not present

## 2017-09-21 DIAGNOSIS — M531 Cervicobrachial syndrome: Secondary | ICD-10-CM | POA: Diagnosis not present

## 2017-09-21 DIAGNOSIS — G5601 Carpal tunnel syndrome, right upper limb: Secondary | ICD-10-CM | POA: Diagnosis not present

## 2017-10-23 DIAGNOSIS — M531 Cervicobrachial syndrome: Secondary | ICD-10-CM | POA: Diagnosis not present

## 2017-10-23 DIAGNOSIS — M25631 Stiffness of right wrist, not elsewhere classified: Secondary | ICD-10-CM | POA: Diagnosis not present

## 2017-10-23 DIAGNOSIS — M530 Cervicocranial syndrome: Secondary | ICD-10-CM | POA: Diagnosis not present

## 2017-10-23 DIAGNOSIS — M25632 Stiffness of left wrist, not elsewhere classified: Secondary | ICD-10-CM | POA: Diagnosis not present

## 2017-10-29 DIAGNOSIS — L718 Other rosacea: Secondary | ICD-10-CM | POA: Diagnosis not present

## 2017-10-29 DIAGNOSIS — D225 Melanocytic nevi of trunk: Secondary | ICD-10-CM | POA: Diagnosis not present

## 2017-10-29 DIAGNOSIS — L905 Scar conditions and fibrosis of skin: Secondary | ICD-10-CM | POA: Diagnosis not present

## 2017-10-29 DIAGNOSIS — Z1283 Encounter for screening for malignant neoplasm of skin: Secondary | ICD-10-CM | POA: Diagnosis not present

## 2017-10-29 DIAGNOSIS — D485 Neoplasm of uncertain behavior of skin: Secondary | ICD-10-CM | POA: Diagnosis not present

## 2017-10-29 DIAGNOSIS — D229 Melanocytic nevi, unspecified: Secondary | ICD-10-CM | POA: Diagnosis not present

## 2017-11-12 ENCOUNTER — Encounter: Payer: Self-pay | Admitting: Family Medicine

## 2017-11-12 ENCOUNTER — Ambulatory Visit: Payer: BLUE CROSS/BLUE SHIELD | Admitting: Family Medicine

## 2017-11-12 VITALS — BP 118/62 | HR 67 | Temp 98.1°F | Wt 141.0 lb

## 2017-11-12 DIAGNOSIS — J019 Acute sinusitis, unspecified: Secondary | ICD-10-CM

## 2017-11-12 MED ORDER — AMOXICILLIN-POT CLAVULANATE 875-125 MG PO TABS: 1.0000 | ORAL_TABLET | Freq: Two times a day (BID) | ORAL | 0 refills | Status: AC

## 2017-11-12 MED ORDER — AMOXICILLIN-POT CLAVULANATE 875-125 MG PO TABS: 1.0000 | ORAL_TABLET | Freq: Two times a day (BID) | ORAL | 0 refills | Status: DC

## 2017-11-12 NOTE — Patient Instructions (Signed)
You have a sinus infection. Take medicine as prescribed: augmentin.  Push fluids and plenty of rest. Ibuprofen.  Nasal saline irrigation to help drain sinuses. May use plain mucinex with plenty of fluid to help mobilize mucous. Please let us know if fever >101.5, trouble opening/closing mouth, difficulty swallowing, or worsening instead of improving as expected.  Try to extend flonase in fall and spring seasons by a few weeks.

## 2017-11-12 NOTE — Assessment & Plan Note (Signed)
Anticipate bacterial sinusitis given duration and progression of symptoms. Treat with augmentin course. Further supportive care as per instructions. Suggested extending fall flonase by 1-2 wks.  Pt agrees with plan.

## 2017-11-12 NOTE — Progress Notes (Signed)
BP 118/62 (BP Location: Left Arm, Patient Position: Sitting, Cuff Size: Normal)   Pulse 67   Temp 98.1 F (36.7 C) (Oral)   Wt 141 lb (64 kg)   LMP 10/22/2017   SpO2 96%   BMI 22.08 kg/m    CC: sinus congestion Subjective:    Patient ID: Para March, female    DOB: [birth year], 26 y.o.   MRN: [id number]  HPI: Caroline [name] is a 26 y.o. female presenting on 11/12/2017 for Sinus Problem (head and facial pressure, drainage at night and productive cough. Usually gets sxs about 2x yearly and abx is what works)   3 week h/o facial tenderness, headaches, earache, lung pain, post nasal drainage at night, sore throat in the morning, nausea from drainage. Mild cough. Some dyspnea and wheezing. Head > chest congestion.   No fevers/chills, tooth pain.  Treating with ibuprofen.  No sick contacts at home. Mom started smoking yesterday.   2nd sinusitis this year (last 06/2017). She normally takes flonase first few weeks of fall (falling leaves)   Relevant past medical, surgical, family and social history reviewed and updated as indicated. Interim medical history since our last visit reviewed. Allergies and medications reviewed and updated. Outpatient Medications Prior to Visit  Medication Sig Dispense Refill  . cetirizine (ZYRTEC) 10 MG tablet Take 10 mg by mouth daily as needed for allergies.    . Cholecalciferol (VITAMIN D) 2000 units CAPS Take 1 capsule (2,000 Units total) by mouth daily. 30 capsule   . polyethylene glycol (MIRALAX / GLYCOLAX) packet Take 17 g by mouth daily.    . vitamin B-12 (CYANOCOBALAMIN) 1000 MCG tablet Take 1 tablet (1,000 mcg total) by mouth daily.     No facility-administered medications prior to visit.      Per HPI unless specifically indicated in ROS section below Review of Systems     Objective:    BP 118/62 (BP Location: Left Arm, Patient Position: Sitting, Cuff Size: Normal)   Pulse 67   Temp 98.1 F (36.7 C) (Oral)   Wt 141 lb (64 kg)   LMP  10/22/2017   SpO2 96%   BMI 22.08 kg/m   Wt Readings from Last 3 Encounters:  11/12/17 141 lb (64 kg)  07/23/17 145 lb (65.8 kg)  05/17/17 147 lb 8 oz (66.9 kg)    Physical Exam  Constitutional: She appears well-developed and well-nourished. No distress.  HENT:  Head: Normocephalic and atraumatic.  Right Ear: Hearing, tympanic membrane, external ear and ear canal normal.  Left Ear: Hearing, tympanic membrane, external ear and ear canal normal.  Nose: Mucosal edema (nasal erythema) present. No rhinorrhea. Right sinus exhibits maxillary sinus tenderness. Right sinus exhibits no frontal sinus tenderness. Left sinus exhibits maxillary sinus tenderness. Left sinus exhibits no frontal sinus tenderness.  Mouth/Throat: Uvula is midline, oropharynx is clear and moist and mucous membranes are normal. No oropharyngeal exudate, posterior oropharyngeal edema, posterior oropharyngeal erythema or tonsillar abscesses.  Eyes: Conjunctivae and EOM are normal. Pupils are equal, round, and reactive to light. No scleral icterus.  Neck: Normal range of motion. Neck supple.  Cardiovascular: Normal rate, regular rhythm, normal heart sounds and intact distal pulses.  No murmur heard. Pulmonary/Chest: Effort normal and breath sounds normal. No respiratory distress. She has no wheezes. She has no rales.  Lymphadenopathy:    She has no cervical adenopathy.  Skin: Skin is warm and dry. No rash noted.  Nursing note and vitals reviewed.     Assessment &  Plan:   Problem List Items Addressed This Visit    Acute sinusitis - Primary    Anticipate bacterial sinusitis given duration and progression of symptoms. Treat with augmentin course. Further supportive care as per instructions. Suggested extending fall flonase by 1-2 wks.  Pt agrees with plan.       Relevant Medications   amoxicillin-clavulanate (AUGMENTIN) 875-125 MG tablet       Follow up plan: Return if symptoms worsen or fail to improve.  [name], MD

## 2017-11-23 DIAGNOSIS — M25531 Pain in right wrist: Secondary | ICD-10-CM | POA: Diagnosis not present

## 2017-11-23 DIAGNOSIS — M25532 Pain in left wrist: Secondary | ICD-10-CM | POA: Diagnosis not present

## 2017-11-23 DIAGNOSIS — M531 Cervicobrachial syndrome: Secondary | ICD-10-CM | POA: Diagnosis not present

## 2017-11-23 DIAGNOSIS — M9901 Segmental and somatic dysfunction of cervical region: Secondary | ICD-10-CM | POA: Diagnosis not present

## 2018-01-16 DIAGNOSIS — M530 Cervicocranial syndrome: Secondary | ICD-10-CM | POA: Diagnosis not present

## 2018-01-16 DIAGNOSIS — M9903 Segmental and somatic dysfunction of lumbar region: Secondary | ICD-10-CM | POA: Diagnosis not present

## 2018-01-16 DIAGNOSIS — M531 Cervicobrachial syndrome: Secondary | ICD-10-CM | POA: Diagnosis not present

## 2018-01-16 DIAGNOSIS — G44219 Episodic tension-type headache, not intractable: Secondary | ICD-10-CM | POA: Diagnosis not present

## 2018-03-21 DIAGNOSIS — H04301 Unspecified dacryocystitis of right lacrimal passage: Secondary | ICD-10-CM | POA: Diagnosis not present

## 2018-03-22 DIAGNOSIS — M531 Cervicobrachial syndrome: Secondary | ICD-10-CM | POA: Diagnosis not present

## 2018-03-22 DIAGNOSIS — M5386 Other specified dorsopathies, lumbar region: Secondary | ICD-10-CM | POA: Diagnosis not present

## 2018-03-22 DIAGNOSIS — M9902 Segmental and somatic dysfunction of thoracic region: Secondary | ICD-10-CM | POA: Diagnosis not present

## 2018-05-10 DIAGNOSIS — M5386 Other specified dorsopathies, lumbar region: Secondary | ICD-10-CM | POA: Diagnosis not present

## 2018-05-10 DIAGNOSIS — M9902 Segmental and somatic dysfunction of thoracic region: Secondary | ICD-10-CM | POA: Diagnosis not present

## 2018-05-10 DIAGNOSIS — M531 Cervicobrachial syndrome: Secondary | ICD-10-CM | POA: Diagnosis not present

## 2018-06-26 ENCOUNTER — Encounter: Payer: Self-pay | Admitting: Family Medicine

## 2018-06-26 ENCOUNTER — Ambulatory Visit (INDEPENDENT_AMBULATORY_CARE_PROVIDER_SITE_OTHER): Payer: BLUE CROSS/BLUE SHIELD | Admitting: Family Medicine

## 2018-06-26 ENCOUNTER — Encounter (INDEPENDENT_AMBULATORY_CARE_PROVIDER_SITE_OTHER): Payer: Self-pay

## 2018-06-26 VITALS — BP 118/72 | HR 79 | Temp 98.2°F | Ht 67.0 in | Wt 138.5 lb

## 2018-06-26 DIAGNOSIS — E559 Vitamin D deficiency, unspecified: Secondary | ICD-10-CM

## 2018-06-26 DIAGNOSIS — K5909 Other constipation: Secondary | ICD-10-CM

## 2018-06-26 DIAGNOSIS — R5382 Chronic fatigue, unspecified: Secondary | ICD-10-CM | POA: Insufficient documentation

## 2018-06-26 DIAGNOSIS — J019 Acute sinusitis, unspecified: Secondary | ICD-10-CM

## 2018-06-26 DIAGNOSIS — R5383 Other fatigue: Secondary | ICD-10-CM | POA: Diagnosis not present

## 2018-06-26 DIAGNOSIS — Z8349 Family history of other endocrine, nutritional and metabolic diseases: Secondary | ICD-10-CM

## 2018-06-26 HISTORY — DX: Chronic fatigue, unspecified: R53.82

## 2018-06-26 MED ORDER — AMOXICILLIN 875 MG PO TABS: 875.0000 mg | ORAL_TABLET | Freq: Two times a day (BID) | ORAL | 0 refills | Status: DC

## 2018-06-26 NOTE — Assessment & Plan Note (Signed)
Anticipate viral given short duration. Supportive care reviewed - scheduled nasacort, ibuprofen, guaifenesin. Given h/o recurrent bacterial sinusitis, Rx for amoxicillin printed for patient with indications when to take.

## 2018-06-26 NOTE — Patient Instructions (Signed)
Possible sinus infection developing.  Schedule nasacort over next few days. Add on plain mucinex with water to break up mucous. If ongoing sinus symptoms past 10 days, fill antibiotic provided today. Labs today and we will be in touch with results.  Increase miralax to 2 capfuls daily x 3 days.

## 2018-06-26 NOTE — Assessment & Plan Note (Signed)
Worsening despite miralax. Intolerance to others. Will increase miralax to 2 capfuls daily x 3 days then monitor effect. Check TSH.

## 2018-06-26 NOTE — Assessment & Plan Note (Signed)
Ongoing for months correlating to increased work load at work. Will check for reversible treatable causes of fatigue.

## 2018-06-26 NOTE — Progress Notes (Signed)
BP 118/72 (BP Location: Left Arm, Patient Position: Sitting, Cuff Size: Normal)   Pulse 79   Temp 98.2 F (36.8 C) (Oral)   Ht 5\' 7"  (1.702 m)   Wt 138 lb 8 oz (62.8 kg)   LMP 06/04/2018   SpO2 99%   BMI 21.69 kg/m    CC: sinus problem  Subjective:    Patient ID: Caroline [name], female    DOB: [birth year], 27 y.o.   MRN: [id number]  HPI: Caroline [name] is a 27 y.o. female presenting on 06/26/2018 for Sinus Problem (C/o sinus pain, drainage, coughing yellowis-green mucous. ); Fatigue (Requests blood work due to fatigue. Thinks it may be thyroid-related.); and Discuss Medication (Wants to discuss MiraLax. States it no longer works.)   8 d h/o maxillary sinus swelling, nosebleeds at night, productive cough of green mucous. Increased sinus congestion and sore throat with PNdrainage. Some headaches. Last abx for sinusitis 10/2017.   No fevers/chills.  No sick contacts at home - father did have pneumonia several weeks ago - hospitalized in ICU.  Sick father at home - bad oxygen dependent COPD   2 month h/o increasing fatigue - notices more in afternoons. Increasing caffeine to no avail. Increased overtime at work. Averages 6 hours/night. Drinks 1 large cup of coffee + tea and soda. fmhx hypothyroidism. Pretty regular with vit b12 and D supplementation, also takes women's multivitamin.   H/o colonic inertia - previously treated with miralax but more recently not effective. Usually uses 1.5-2 caps of miralax daily. Linzess intolerable, MOM intolerable, dulcolax stool softener and laxative and soluble fiber supplement caused worsening cramps. Unsure if she's tolerated amitiza in the past. Increasing abd discomfort. Good water intake, good fiber intake.   Relevant past medical, surgical, family and social history reviewed and updated as indicated. Interim medical history since our last visit reviewed. Allergies and medications reviewed and updated. Outpatient Medications Prior to Visit    Medication Sig Dispense Refill  . cetirizine (ZYRTEC) 10 MG tablet Take 10 mg by mouth daily as needed for allergies.    . Cholecalciferol (VITAMIN D) 2000 units CAPS Take 1 capsule (2,000 Units total) by mouth daily. 30 capsule   . Multiple Vitamins-Calcium (ONE-A-DAY WOMENS FORMULA PO) Take 1 tablet by mouth daily.    . polyethylene glycol (MIRALAX / GLYCOLAX) packet Take 17 g by mouth daily.    . vitamin B-12 (CYANOCOBALAMIN) 1000 MCG tablet Take 1 tablet (1,000 mcg total) by mouth daily.    Marland Kitchen triamcinolone (NASACORT) 55 MCG/ACT AERO nasal inhaler Place 2 sprays into the nose daily. 1 Inhaler    No facility-administered medications prior to visit.      Per HPI unless specifically indicated in ROS section below Review of Systems     Objective:    BP 118/72 (BP Location: Left Arm, Patient Position: Sitting, Cuff Size: Normal)   Pulse 79   Temp 98.2 F (36.8 C) (Oral)   Ht 5\' 7"  (1.702 m)   Wt 138 lb 8 oz (62.8 kg)   LMP 06/04/2018   SpO2 99%   BMI 21.69 kg/m   Wt Readings from Last 3 Encounters:  06/26/18 138 lb 8 oz (62.8 kg)  11/12/17 141 lb (64 kg)  07/23/17 145 lb (65.8 kg)    Physical Exam  Constitutional: She appears well-developed and well-nourished. No distress.  HENT:  Head: Normocephalic and atraumatic.  Right Ear: Hearing, tympanic membrane, external ear and ear canal normal.  Left Ear: Hearing, tympanic membrane, external  ear and ear canal normal.  Nose: No mucosal edema or rhinorrhea. Right sinus exhibits maxillary sinus tenderness. Right sinus exhibits no frontal sinus tenderness. Left sinus exhibits maxillary sinus tenderness. Left sinus exhibits no frontal sinus tenderness.  Mouth/Throat: Uvula is midline, oropharynx is clear and moist and mucous membranes are normal. No oropharyngeal exudate, posterior oropharyngeal edema, posterior oropharyngeal erythema or tonsillar abscesses.  Eyes: Pupils are equal, round, and reactive to light. Conjunctivae and EOM are  normal. No scleral icterus.  Neck: Normal range of motion. Neck supple. No thyromegaly present.  Cardiovascular: Normal rate, regular rhythm, normal heart sounds and intact distal pulses.  No murmur heard. Pulmonary/Chest: Effort normal and breath sounds normal. No respiratory distress. She has no wheezes. She has no rales.  Lymphadenopathy:    She has no cervical adenopathy.  Skin: Skin is warm and dry. No rash noted.  Nursing note and vitals reviewed.  Results for orders placed or performed in visit on 05/17/17  Vitamin B12  Result Value Ref Range   Vitamin B-12 327 232 - 1,245 pg/mL  VITAMIN D 25 Hydroxy (Vit-D Deficiency, Fractures)  Result Value Ref Range   Vit D, 25-Hydroxy 26.6 (L) 30.0 - 100.0 ng/mL  T4, free  Result Value Ref Range   Free T4 1.45 0.82 - 1.77 ng/dL  TSH  Result Value Ref Range   TSH 2.260 0.450 - 4.500 uIU/mL  Basic metabolic panel  Result Value Ref Range   Glucose 81 65 - 99 mg/dL   BUN 7 6 - 20 mg/dL   Creatinine, Ser 0.69 0.57 - 1.00 mg/dL   GFR calc non Af Amer 121 >59 mL/min/1.73   GFR calc Af Amer 140 >59 mL/min/1.73   BUN/Creatinine Ratio 10 9 - 23   Sodium 141 134 - 144 mmol/L   Potassium 4.4 3.5 - 5.2 mmol/L   Chloride 103 96 - 106 mmol/L   CO2 24 20 - 29 mmol/L   Calcium 9.8 8.7 - 10.2 mg/dL      Assessment & Plan:   Problem List Items Addressed This Visit    Vitamin D deficiency   Relevant Orders   VITAMIN D 25 Hydroxy (Vit-D Deficiency, Fractures)   Fatigue    Ongoing for months correlating to increased work load at work. Will check for reversible treatable causes of fatigue.       Relevant Orders   Vitamin B12   VITAMIN D 25 Hydroxy (Vit-D Deficiency, Fractures)   CBC with Differential/Platelet   Basic metabolic panel   TSH   T4, free   CONSTIPATION, CHRONIC    Worsening despite miralax. Intolerance to others. Will increase miralax to 2 capfuls daily x 3 days then monitor effect. Check TSH.        Relevant Orders   TSH    Acute sinusitis - Primary    Anticipate viral given short duration. Supportive care reviewed - scheduled nasacort, ibuprofen, guaifenesin. Given h/o recurrent bacterial sinusitis, Rx for amoxicillin printed for patient with indications when to take.       Relevant Medications   triamcinolone (NASACORT) 55 MCG/ACT AERO nasal inhaler   amoxicillin (AMOXIL) 875 MG tablet    Other Visit Diagnoses    Family history of hypothyroidism       Relevant Orders   TSH   T4, free       Meds ordered this encounter  Medications  . amoxicillin (AMOXIL) 875 MG tablet    Sig: Take 1 tablet (875 mg total) by mouth  2 (two) times daily.    Dispense:  20 tablet    Refill:  0   Orders Placed This Encounter  Procedures  . Vitamin B12  . VITAMIN D 25 Hydroxy (Vit-D Deficiency, Fractures)  . CBC with Differential/Platelet  . Basic metabolic panel  . TSH  . T4, free    Follow up plan: Return if symptoms worsen or fail to improve.  [name], MD

## 2018-06-28 ENCOUNTER — Telehealth: Payer: Self-pay | Admitting: Radiology

## 2018-06-28 DIAGNOSIS — K5909 Other constipation: Secondary | ICD-10-CM

## 2018-06-28 DIAGNOSIS — R5383 Other fatigue: Secondary | ICD-10-CM

## 2018-06-28 DIAGNOSIS — Z8349 Family history of other endocrine, nutritional and metabolic diseases: Secondary | ICD-10-CM

## 2018-06-28 DIAGNOSIS — E559 Vitamin D deficiency, unspecified: Secondary | ICD-10-CM

## 2018-06-28 NOTE — Telephone Encounter (Signed)
Patient returned my call, will come in next week for a redraw

## 2018-06-28 NOTE — Telephone Encounter (Signed)
Patient requested lab work to be done at [facility], courier for [facility] never picked up the sample, blood discarded, needs to be redrawn. Left message for the patient to call so I can inform.

## 2018-07-10 DIAGNOSIS — M25531 Pain in right wrist: Secondary | ICD-10-CM | POA: Diagnosis not present

## 2018-07-10 DIAGNOSIS — M5386 Other specified dorsopathies, lumbar region: Secondary | ICD-10-CM | POA: Diagnosis not present

## 2018-07-10 DIAGNOSIS — M9902 Segmental and somatic dysfunction of thoracic region: Secondary | ICD-10-CM | POA: Diagnosis not present

## 2018-07-10 DIAGNOSIS — M531 Cervicobrachial syndrome: Secondary | ICD-10-CM | POA: Diagnosis not present

## 2018-07-16 ENCOUNTER — Other Ambulatory Visit: Payer: BLUE CROSS/BLUE SHIELD

## 2018-07-16 DIAGNOSIS — Z8349 Family history of other endocrine, nutritional and metabolic diseases: Secondary | ICD-10-CM

## 2018-07-16 DIAGNOSIS — R5383 Other fatigue: Secondary | ICD-10-CM | POA: Diagnosis not present

## 2018-07-16 DIAGNOSIS — E559 Vitamin D deficiency, unspecified: Secondary | ICD-10-CM

## 2018-07-16 DIAGNOSIS — K5909 Other constipation: Secondary | ICD-10-CM | POA: Diagnosis not present

## 2018-07-17 LAB — BASIC METABOLIC PANEL
BUN/Creatinine Ratio: 11 (ref 9–23)
BUN: 8 mg/dL (ref 6–20)
CO2: 24 mmol/L (ref 20–29)
Calcium: 9.5 mg/dL (ref 8.7–10.2)
Chloride: 106 mmol/L (ref 96–106)
Creatinine, Ser: 0.72 mg/dL (ref 0.57–1.00)
GFR calc Af Amer: 134 mL/min/{1.73_m2} (ref >59)
GFR, EST NON AFRICAN AMERICAN: 116 mL/min/{1.73_m2} (ref >59)
GLUCOSE: 85 mg/dL (ref 65–99)
POTASSIUM: 4.1 mmol/L (ref 3.5–5.2)
SODIUM: 143 mmol/L (ref 134–144)

## 2018-07-17 LAB — CBC WITH DIFFERENTIAL/PLATELET
BASOS: 0 %
Basophils Absolute: 0 10*3/uL (ref 0.0–0.2)
EOS (ABSOLUTE): 0.1 10*3/uL (ref 0.0–0.4)
Eos: 1 %
Hematocrit: 42.5 % (ref 34.0–46.6)
Hemoglobin: 13.2 g/dL (ref 11.1–15.9)
IMMATURE GRANULOCYTES: 0 %
Immature Grans (Abs): 0 10*3/uL (ref 0.0–0.1)
LYMPHS ABS: 2.6 10*3/uL (ref 0.7–3.1)
Lymphs: 40 %
MCH: 26.6 pg (ref 26.6–33.0)
MCHC: 31.1 g/dL — AB (ref 31.5–35.7)
MCV: 86 fL (ref 79–97)
MONOS ABS: 0.5 10*3/uL (ref 0.1–0.9)
Monocytes: 8 %
NEUTROS PCT: 51 %
Neutrophils Absolute: 3.4 10*3/uL (ref 1.4–7.0)
Platelets: 265 10*3/uL (ref 150–450)
RBC: 4.97 x10E6/uL (ref 3.77–5.28)
RDW: 14.2 % (ref 12.3–15.4)
WBC: 6.6 10*3/uL (ref 3.4–10.8)

## 2018-07-17 LAB — VITAMIN B12: Vitamin B-12: 467 pg/mL (ref 232–1245)

## 2018-07-17 LAB — TSH: TSH: 2.74 u[IU]/mL (ref 0.450–4.500)

## 2018-07-17 LAB — VITAMIN D 25 HYDROXY (VIT D DEFICIENCY, FRACTURES): Vit D, 25-Hydroxy: 35.5 ng/mL (ref 30.0–100.0)

## 2018-07-17 LAB — T4, FREE: Free T4: 1.29 ng/dL (ref 0.82–1.77)

## 2018-09-11 DIAGNOSIS — M531 Cervicobrachial syndrome: Secondary | ICD-10-CM | POA: Diagnosis not present

## 2018-09-11 DIAGNOSIS — M5386 Other specified dorsopathies, lumbar region: Secondary | ICD-10-CM | POA: Diagnosis not present

## 2018-09-11 DIAGNOSIS — M25531 Pain in right wrist: Secondary | ICD-10-CM | POA: Diagnosis not present

## 2018-09-11 DIAGNOSIS — M9902 Segmental and somatic dysfunction of thoracic region: Secondary | ICD-10-CM | POA: Diagnosis not present

## 2018-10-28 DIAGNOSIS — Z1283 Encounter for screening for malignant neoplasm of skin: Secondary | ICD-10-CM | POA: Diagnosis not present

## 2018-10-28 DIAGNOSIS — D485 Neoplasm of uncertain behavior of skin: Secondary | ICD-10-CM | POA: Diagnosis not present

## 2018-10-28 DIAGNOSIS — L812 Freckles: Secondary | ICD-10-CM | POA: Diagnosis not present

## 2018-10-28 DIAGNOSIS — D229 Melanocytic nevi, unspecified: Secondary | ICD-10-CM | POA: Diagnosis not present

## 2018-10-28 DIAGNOSIS — D225 Melanocytic nevi of trunk: Secondary | ICD-10-CM | POA: Diagnosis not present

## 2018-10-28 DIAGNOSIS — L718 Other rosacea: Secondary | ICD-10-CM | POA: Diagnosis not present

## 2018-11-18 NOTE — Progress Notes (Signed)
BP 118/74 (BP Location: Left Arm, Patient Position: Sitting, Cuff Size: Normal)   Pulse 99   Temp 98.2 F (36.8 C) (Oral)   Ht 5\' 7"  (1.702 m)   Wt 138 lb (62.6 kg)   LMP 11/12/2018   SpO2 100%   BMI 21.61 kg/m    CC: "I'm sick" Subjective:    Patient ID: Caroline [name], female    DOB: 1991/07/07, 27 y.o.   MRN: [id number]  HPI: Caroline [name] is a 27 y.o. female presenting on 11/19/2018 for Cough (C/o cough, fever, nausea and loss of appetite  Sxs started on 11/17/18. Tried Mucinex, ibuprofen and cough drops. ) and Sore Throat (C/o sore throat, left ear pain and nasal drainage. )   5d h/o ST, coughing fits, L earache this morning, sinus congestion. + PNDrainage. Non productive cough present.   Nauseated - cough drops make her sick. No h/o asthma.  Allergies largely well controlled.   Taking mucinex, ibuprofen 400mg , and cough drops.   Father in hospital with PNA recently - she has been visiting him.   Relevant past medical, surgical, family and social history reviewed and updated as indicated. Interim medical history since our last visit reviewed. Allergies and medications reviewed and updated. Outpatient Medications Prior to Visit  Medication Sig Dispense Refill  . cetirizine (ZYRTEC) 10 MG tablet Take 10 mg by mouth daily as needed for allergies.    . Cholecalciferol (VITAMIN D) 2000 units CAPS Take 1 capsule (2,000 Units total) by mouth daily. 30 capsule   . Multiple Vitamins-Calcium (ONE-A-DAY WOMENS FORMULA PO) Take 1 tablet by mouth daily.    . polyethylene glycol (MIRALAX / GLYCOLAX) packet Take 17 g by mouth daily.    Marland Kitchen triamcinolone (NASACORT) 55 MCG/ACT AERO nasal inhaler Place 2 sprays into the nose daily. 1 Inhaler   . vitamin B-12 (CYANOCOBALAMIN) 1000 MCG tablet Take 1 tablet (1,000 mcg total) by mouth daily.    Marland Kitchen amoxicillin (AMOXIL) 875 MG tablet Take 1 tablet (875 mg total) by mouth 2 (two) times daily. 20 tablet 0   No facility-administered medications  prior to visit.      Per HPI unless specifically indicated in ROS section below Review of Systems     Objective:    BP 118/74 (BP Location: Left Arm, Patient Position: Sitting, Cuff Size: Normal)   Pulse 99   Temp 98.2 F (36.8 C) (Oral)   Ht 5\' 7"  (1.702 m)   Wt 138 lb (62.6 kg)   LMP 11/12/2018   SpO2 100%   BMI 21.61 kg/m   Wt Readings from Last 3 Encounters:  11/19/18 138 lb (62.6 kg)  06/26/18 138 lb 8 oz (62.8 kg)  11/12/17 141 lb (64 kg)    Physical Exam  Constitutional: She appears well-developed and well-nourished. No distress.  Tired appearing  HENT:  Head: Normocephalic and atraumatic.  Right Ear: Hearing, tympanic membrane, external ear and ear canal normal.  Left Ear: Hearing, tympanic membrane, external ear and ear canal normal.  Nose: Mucosal edema (nasal mucosal erythema/congestion) and rhinorrhea present. Right sinus exhibits no maxillary sinus tenderness and no frontal sinus tenderness. Left sinus exhibits no maxillary sinus tenderness and no frontal sinus tenderness.  Mouth/Throat: Uvula is midline and mucous membranes are normal. Posterior oropharyngeal erythema present. No oropharyngeal exudate, posterior oropharyngeal edema or tonsillar abscesses.  Eyes: Pupils are equal, round, and reactive to light. Conjunctivae and EOM are normal. No scleral icterus.  Neck: Normal range of motion. Neck supple.  Cardiovascular: Normal rate, regular rhythm, normal heart sounds and intact distal pulses.  No murmur heard. Pulmonary/Chest: Effort normal and breath sounds normal. No respiratory distress. She has no wheezes. She has no rales.  Lungs largely clear  Lymphadenopathy:    She has no cervical adenopathy.  Skin: Skin is warm and dry. No rash noted.  Nursing note and vitals reviewed.  Results for orders placed or performed in visit on 11/19/18  POC Influenza A&B(BINAX/QUICKVUE)  Result Value Ref Range   Influenza A, POC Negative Negative   Influenza B, POC  Positive (A) Negative      Assessment & Plan:   Problem List Items Addressed This Visit    Influenza B - Primary    Flu swab positive After discussion of benefits/limitations of tamiflu, pt requests treatment. 5d course sent to pharmacy. Discussed ibuprofen use, discussed viscous lidocaine for throat. Update if not improving with treatment.       Relevant Medications   oseltamivir (TAMIFLU) 75 MG capsule    Other Visit Diagnoses    Body aches       Relevant Orders   POC Influenza A&B(BINAX/QUICKVUE) (Completed)       Meds ordered this encounter  Medications  . oseltamivir (TAMIFLU) 75 MG capsule    Sig: Take 1 capsule (75 mg total) by mouth 2 (two) times daily.    Dispense:  10 capsule    Refill:  0  . lidocaine (XYLOCAINE) 2 % solution    Sig: Use as directed 5-10 mLs in the mouth or throat 3 (three) times daily as needed for mouth pain.    Dispense:  100 mL    Refill:  0   Orders Placed This Encounter  Procedures  . POC Influenza A&B(BINAX/QUICKVUE)    Follow up plan: Return if symptoms worsen or fail to improve.  [name], MD

## 2018-11-19 ENCOUNTER — Ambulatory Visit: Payer: BLUE CROSS/BLUE SHIELD | Admitting: Family Medicine

## 2018-11-19 ENCOUNTER — Encounter: Payer: Self-pay | Admitting: Family Medicine

## 2018-11-19 VITALS — BP 118/74 | HR 99 | Temp 98.2°F | Ht 67.0 in | Wt 138.0 lb

## 2018-11-19 DIAGNOSIS — J101 Influenza due to other identified influenza virus with other respiratory manifestations: Secondary | ICD-10-CM | POA: Diagnosis not present

## 2018-11-19 DIAGNOSIS — R52 Pain, unspecified: Secondary | ICD-10-CM

## 2018-11-19 HISTORY — DX: Influenza due to other identified influenza virus with other respiratory manifestations: J10.1

## 2018-11-19 LAB — POC INFLUENZA A&B (BINAX/QUICKVUE)
Influenza A, POC: NEGATIVE
Influenza B, POC: POSITIVE — AB

## 2018-11-19 MED ORDER — OSELTAMIVIR PHOSPHATE 75 MG PO CAPS: 75.0000 mg | ORAL_CAPSULE | Freq: Two times a day (BID) | ORAL | 0 refills | Status: DC

## 2018-11-19 MED ORDER — LIDOCAINE VISCOUS HCL 2 % MT SOLN: 5.0000 mL | Freq: Three times a day (TID) | OROMUCOSAL | 0 refills | Status: DC | PRN

## 2018-11-19 NOTE — Patient Instructions (Addendum)
You are positive for influenza B.  Treat with tamiflu for 5 days.  Push fluids and rest.  Continue ibuprofen 600mg  with meals as tolerated.  Try magic mouthwash   Influenza, Adult Influenza, more commonly known as "the flu," is a viral infection that primarily affects the respiratory tract. The respiratory tract includes organs that help you breathe, such as the lungs, nose, and throat. The flu causes many common cold symptoms, as well as a high fever and body aches. The flu spreads easily from person to person (is contagious). Getting a flu shot (influenza vaccination) every year is the best way to prevent influenza. What are the causes? Influenza is caused by a virus. You can catch the virus by:  Breathing in droplets from an infected person's cough or sneeze.  Touching something that was recently contaminated with the virus and then touching your mouth, nose, or eyes.  What increases the risk? The following factors may make you more likely to get the flu:  Not cleaning your hands frequently with soap and water or alcohol-based hand sanitizer.  Having close contact with many people during cold and flu season.  Touching your mouth, eyes, or nose without washing or sanitizing your hands first.  Not drinking enough fluids or not eating a healthy diet.  Not getting enough sleep or exercise.  Being under a high amount of stress.  Not getting a yearly (annual) flu shot.  You may be at a higher risk of complications from the flu, such as a severe lung infection (pneumonia), if you:  Are over the age of 36.  Are pregnant.  Have a weakened disease-fighting system (immune system). You may have a weakened immune system if you: ? Have HIV or AIDS. ? Are undergoing chemotherapy. ? Aretaking medicines that reduce the activity of (suppress) the immune system.  Have a long-term (chronic) illness, such as heart disease, kidney disease, diabetes, or lung disease.  Have a liver  disorder.  Are obese.  Have anemia.  What are the signs or symptoms? Symptoms of this condition typically last 4-10 days and may include:  Fever.  Chills.  Headache, body aches, or muscle aches.  Sore throat.  Cough.  Runny or congested nose.  Chest discomfort and cough.  Poor appetite.  Weakness or tiredness (fatigue).  Dizziness.  Nausea or vomiting.  How is this diagnosed? This condition may be diagnosed based on your medical history and a physical exam. Your health care provider may do a nose or throat swab test to confirm the diagnosis. How is this treated? If influenza is detected early, you can be treated with antiviral medicine that can reduce the length of your illness and the severity of your symptoms. This medicine may be given by mouth (orally) or through an IV tube that is inserted in one of your veins. The goal of treatment is to relieve symptoms by taking care of yourself at home. This may include taking over-the-counter medicines, drinking plenty of fluids, and adding humidity to the air in your home. In some cases, influenza goes away on its own. Severe influenza or complications from influenza may be treated in a hospital. Follow these instructions at home:  Take over-the-counter and prescription medicines only as told by your health care provider.  Use a cool mist humidifier to add humidity to the air in your home. This can make breathing easier.  Rest as needed.  Drink enough fluid to keep your urine clear or pale yellow.  Cover your mouth  and nose when you cough or sneeze.  Wash your hands with soap and water often, especially after you cough or sneeze. If soap and water are not available, use hand sanitizer.  Stay home from work or school as told by your health care provider. Unless you are visiting your health care provider, try to avoid leaving home until your fever has been gone for 24 hours without the use of medicine.  Keep all follow-up  visits as told by your health care provider. This is important. How is this prevented?  Getting an annual flu shot is the best way to avoid getting the flu. You may get the flu shot in late summer, fall, or winter. Ask your health care provider when you should get your flu shot.  Wash your hands often or use hand sanitizer often.  Avoid contact with people who are sick during cold and flu season.  Eat a healthy diet, drink plenty of fluids, get enough sleep, and exercise regularly. Contact a health care provider if:  You develop new symptoms.  You have: ? Chest pain. ? Diarrhea. ? A fever.  Your cough gets worse.  You produce more mucus.  You feel nauseous or you vomit. Get help right away if:  You develop shortness of breath or difficulty breathing.  Your skin or nails turn a bluish color.  You have severe pain or stiffness in your neck.  You develop a sudden headache or sudden pain in your face or ear.  You cannot stop vomiting. This information is not intended to replace advice given to you by your health care provider. Make sure you discuss any questions you have with your health care provider. Document Released: 12/08/2000 Document Revised: 05/18/2016 Document Reviewed: 10/05/2015 Elsevier Interactive Patient Education  2017 [employer].

## 2018-11-19 NOTE — Assessment & Plan Note (Deleted)
Anticipate viral given short duration.  Supportive care reviewed.  Given h/o recurrent sinusitis, Rx provided for amoxicillin antibiotic.

## 2018-11-19 NOTE — Assessment & Plan Note (Signed)
Flu swab positive After discussion of benefits/limitations of tamiflu, pt requests treatment. 5d course sent to pharmacy. Discussed ibuprofen use, discussed viscous lidocaine for throat. Update if not improving with treatment.

## 2018-12-12 ENCOUNTER — Encounter: Payer: Self-pay | Admitting: Family Medicine

## 2018-12-12 ENCOUNTER — Ambulatory Visit: Payer: BLUE CROSS/BLUE SHIELD | Admitting: Family Medicine

## 2018-12-12 DIAGNOSIS — J0100 Acute maxillary sinusitis, unspecified: Secondary | ICD-10-CM | POA: Diagnosis not present

## 2018-12-12 MED ORDER — AMOXICILLIN 500 MG PO CAPS: 500.0000 mg | ORAL_CAPSULE | Freq: Three times a day (TID) | ORAL | 0 refills | Status: DC

## 2018-12-12 NOTE — Progress Notes (Signed)
BP 116/64 (BP Location: Left Arm, Patient Position: Sitting, Cuff Size: Normal)   Pulse 94   Temp 98 F (36.7 C) (Oral)   Ht 5\' 7"  (1.702 m)   Wt 143 lb (64.9 kg)   LMP 11/12/2018   SpO2 98%   BMI 22.40 kg/m    CC: sinus congestion Subjective:    Patient ID: Caroline [name], female    DOB: Jul 23, 1991, 27 y.o.   MRN: [id number]  HPI: Caroline [name] is a 27 y.o. female presenting on 12/12/2018 for Sinus Problem (C/o sinus congestion, sore throat, nasal drainage-green. Sxs started yesterday. Recently had flu. Concerned she may have it again. ) and Skin lesion (C/o red skin lesion on anterior lower right leg. Denies any irritation to the area. Noticed about 2 wks ago. )   See prior note for details. Seen 11/265/2019 with dx influenza B treated with tamiflu course with benefit.   Last night again had recurrent ST, green/yellow mucous sinus congestion, L facial pain, nausea attributes to PNDrainage. Chills. No fevers. Mild cough. Some body aches and fatigue since flu last month.  No dyspnea or wheezing.  Treating with motrin.  + sick contacts at work.  Did sick at home with hospice.   Skin lesion to R lower leg noted 2 wks ago, red. Not itchy or tender.   Relevant past medical, surgical, family and social history reviewed and updated as indicated. Interim medical history since our last visit reviewed. Allergies and medications reviewed and updated. Outpatient Medications Prior to Visit  Medication Sig Dispense Refill  . cetirizine (ZYRTEC) 10 MG tablet Take 10 mg by mouth daily as needed for allergies.    . Cholecalciferol (VITAMIN D) 2000 units CAPS Take 1 capsule (2,000 Units total) by mouth daily. 30 capsule   . Multiple Vitamins-Calcium (ONE-A-DAY WOMENS FORMULA PO) Take 1 tablet by mouth daily.    . polyethylene glycol (MIRALAX / GLYCOLAX) packet Take 17 g by mouth daily.    Marland Kitchen triamcinolone (NASACORT) 55 MCG/ACT AERO nasal inhaler Place 2 sprays into the nose daily. 1 Inhaler   .  vitamin B-12 (CYANOCOBALAMIN) 1000 MCG tablet Take 1 tablet (1,000 mcg total) by mouth daily.    Marland Kitchen lidocaine (XYLOCAINE) 2 % solution Use as directed 5-10 mLs in the mouth or throat 3 (three) times daily as needed for mouth pain. 100 mL 0  . oseltamivir (TAMIFLU) 75 MG capsule Take 1 capsule (75 mg total) by mouth 2 (two) times daily. 10 capsule 0   No facility-administered medications prior to visit.      Per HPI unless specifically indicated in ROS section below Review of Systems     Objective:    BP 116/64 (BP Location: Left Arm, Patient Position: Sitting, Cuff Size: Normal)   Pulse 94   Temp 98 F (36.7 C) (Oral)   Ht 5\' 7"  (1.702 m)   Wt 143 lb (64.9 kg)   LMP 11/12/2018   SpO2 98%   BMI 22.40 kg/m   Wt Readings from Last 3 Encounters:  12/12/18 143 lb (64.9 kg)  11/19/18 138 lb (62.6 kg)  06/26/18 138 lb 8 oz (62.8 kg)    Physical Exam Vitals signs and nursing note reviewed.  Constitutional:      General: She is not in acute distress.    Appearance: She is well-developed.  HENT:     Head: Normocephalic and atraumatic.     Right Ear: Hearing, tympanic membrane, ear canal and external ear normal.  Left Ear: Hearing, tympanic membrane, ear canal and external ear normal.     Nose: Mucosal edema and rhinorrhea present.     Right Sinus: No maxillary sinus tenderness or frontal sinus tenderness.     Left Sinus: Maxillary sinus tenderness present. No frontal sinus tenderness.     Mouth/Throat:     Pharynx: Uvula midline. Posterior oropharyngeal erythema (mild) present. No oropharyngeal exudate.     Tonsils: No tonsillar abscesses.  Eyes:     General: No scleral icterus.    Conjunctiva/sclera: Conjunctivae normal.     Pupils: Pupils are equal, round, and reactive to light.  Neck:     Musculoskeletal: Normal range of motion and neck supple.  Cardiovascular:     Rate and Rhythm: Normal rate and regular rhythm.     Heart sounds: Normal heart sounds. No murmur.    Pulmonary:     Effort: Pulmonary effort is normal. No respiratory distress.     Breath sounds: Normal breath sounds. No wheezing or rales.  Lymphadenopathy:     Cervical: No cervical adenopathy.  Skin:    General: Skin is warm and dry.     Findings: No rash.    Results for orders placed or performed in visit on 11/19/18  POC Influenza A&B(BINAX/QUICKVUE)  Result Value Ref Range   Influenza A, POC Negative Negative   Influenza B, POC Positive (A) Negative      Assessment & Plan:   Problem List Items Addressed This Visit    Acute sinusitis    Anticipate L sided acute sinusitis after initial influenza 3 wks ago. Will cover with amoxicillin 500mg  course for 1 wk. Further supportive care as per instructions. Update if not improving as expected.      Relevant Medications   amoxicillin (AMOXIL) 500 MG capsule       Meds ordered this encounter  Medications  . amoxicillin (AMOXIL) 500 MG capsule    Sig: Take 1 capsule (500 mg total) by mouth 3 (three) times daily.    Dispense:  21 capsule    Refill:  0   No orders of the defined types were placed in this encounter.  Patient Instructions  You have a sinus infection. Take medicine as prescribed: amoxicillin 500mg  three times daily for 1 week Take mucinex with fluids.  Continue nasacort. Push fluids and plenty of rest. Nasal saline irrigation or neti pot to help drain sinuses. May use plain mucinex with plenty of fluid to help mobilize mucous. Please let us know if fever >101.5, trouble opening/closing mouth, difficulty swallowing, or worsening instead of improving as expected.    Follow up plan: Return if symptoms worsen or fail to improve.  [name], MD

## 2018-12-12 NOTE — Assessment & Plan Note (Signed)
Anticipate L sided acute sinusitis after initial influenza 3 wks ago. Will cover with amoxicillin 500mg  course for 1 wk. Further supportive care as per instructions. Update if not improving as expected.

## 2018-12-12 NOTE — Patient Instructions (Signed)
You have a sinus infection. Take medicine as prescribed: amoxicillin 500mg  three times daily for 1 week Take mucinex with fluids.  Continue nasacort. Push fluids and plenty of rest. Nasal saline irrigation or neti pot to help drain sinuses. May use plain mucinex with plenty of fluid to help mobilize mucous. Please let us know if fever >101.5, trouble opening/closing mouth, difficulty swallowing, or worsening instead of improving as expected.

## 2019-06-04 ENCOUNTER — Other Ambulatory Visit: Payer: Self-pay

## 2019-06-04 ENCOUNTER — Encounter: Payer: Self-pay | Admitting: Family Medicine

## 2019-06-04 ENCOUNTER — Ambulatory Visit: Payer: BC Managed Care – PPO | Admitting: Family Medicine

## 2019-06-04 VITALS — BP 106/78 | HR 103 | Temp 98.4°F | Ht 67.0 in | Wt 143.0 lb

## 2019-06-04 DIAGNOSIS — S50861A Insect bite (nonvenomous) of right forearm, initial encounter: Secondary | ICD-10-CM

## 2019-06-04 DIAGNOSIS — W57XXXA Bitten or stung by nonvenomous insect and other nonvenomous arthropods, initial encounter: Secondary | ICD-10-CM | POA: Insufficient documentation

## 2019-06-04 MED ORDER — AMOXICILLIN-POT CLAVULANATE 875-125 MG PO TABS: 1.0000 | ORAL_TABLET | Freq: Two times a day (BID) | ORAL | 0 refills | Status: AC

## 2019-06-04 NOTE — Assessment & Plan Note (Signed)
To R forearm with surrounding cellulitic changes vs localized reaction to bite (although anticipate more infective given streaking noted). I think bactrim course is contributing to several of her symptoms so did recommend switching to augmentin course (which she has tolerated well in the past). Anticipate dizziness stemming from mild dehydration with orthostatic changes noted in BP. Update with effect.

## 2019-06-04 NOTE — Progress Notes (Signed)
This visit was conducted in person.  BP 106/78 (BP Location: Left Arm, Patient Position: Sitting, Cuff Size: Normal)    Pulse (!) 103    Temp 98.4 F (36.9 C) (Oral)    Ht 5\' 7"  (1.702 m)    Wt 143 lb (64.9 kg)    LMP 05/26/2019    SpO2 98%    BMI 22.40 kg/m   Orthostatic VS for the past 24 hrs (Last 3 readings):  BP- Lying BP- Standing at 0 minutes  06/04/19 1217 -- 106/76  06/04/19 1214 120/78 --    CC: insect bite Subjective:    Patient ID: Caroline [name], female    DOB: [birth year], 28 y.o.   MRN: [id number]  HPI: [name] is a 28 y.o. female presenting on 06/04/2019 for Insect Bite (C/o bug bite on anterior right arm. Area is red. Also, c/o muscle aches and dizziness. Dizziness mostly occurs when standing.  Had e-visit on 06/03/19, prescribed sulfameth/thrimethoprim.  Also, has applied hydrocortisone.  Thinks the abx is causing dizziness. )   DOI: 05/30/2019  Insect bite to anterior R forearm. Unsure what bit her. Progressively spreading. Seen by MD Live E-visit 06/02/2019, started on bactrim 5d course to cover cellulitis. Took 2 pills so far - noticing increasing joint soreness and dizziness - some symptoms before bactrim. Some nausea (attributed to bactrim) and chills. Mild HA. Dizziness described as presyncope and faint feeling.   She feels she's staying well hydrated - lots of water.   Has also tried cortisone-10 and four 25mg  benadryl without relief.  No tylenol/ibuprofen.   No fevers, vomiting, dyspnea, tongue or throat swelling.       Relevant past medical, surgical, family and social history reviewed and updated as indicated. Interim medical history since our last visit reviewed. Allergies and medications reviewed and updated. Outpatient Medications Prior to Visit  Medication Sig Dispense Refill   cetirizine (ZYRTEC) 10 MG tablet Take 10 mg by mouth daily as needed for allergies.     Cholecalciferol (VITAMIN D) 2000 units CAPS Take 1 capsule (2,000 Units total) by  mouth daily. 30 capsule    Multiple Vitamins-Calcium (ONE-A-DAY WOMENS FORMULA PO) Take 1 tablet by mouth daily.     polyethylene glycol (MIRALAX / GLYCOLAX) packet Take 17 g by mouth daily.     sulfamethoxazole-trimethoprim (BACTRIM DS) 800-160 MG tablet Take 1 tablet by mouth 2 (two) times a day.     triamcinolone (NASACORT) 55 MCG/ACT AERO nasal inhaler Place 2 sprays into the nose daily. 1 Inhaler    vitamin B-12 (CYANOCOBALAMIN) 1000 MCG tablet Take 1 tablet (1,000 mcg total) by mouth daily.     amoxicillin (AMOXIL) 500 MG capsule Take 1 capsule (500 mg total) by mouth 3 (three) times daily. 21 capsule 0   No facility-administered medications prior to visit.      Per HPI unless specifically indicated in ROS section below Review of Systems Objective:    BP 106/78 (BP Location: Left Arm, Patient Position: Sitting, Cuff Size: Normal)    Pulse (!) 103    Temp 98.4 F (36.9 C) (Oral)    Ht 5\' 7"  (1.702 m)    Wt 143 lb (64.9 kg)    LMP 05/26/2019    SpO2 98%    BMI 22.40 kg/m   Wt Readings from Last 3 Encounters:  06/04/19 143 lb (64.9 kg)  12/12/18 143 lb (64.9 kg)  11/19/18 138 lb (62.6 kg)    Physical Exam Vitals signs and nursing  note reviewed.  Constitutional:      General: She is not in acute distress.    Appearance: Normal appearance. She is not ill-appearing.  HENT:     Head: Normocephalic and atraumatic.     Mouth/Throat:     Mouth: Mucous membranes are moist.     Pharynx: No posterior oropharyngeal erythema.  Eyes:     Extraocular Movements: Extraocular movements intact.     Conjunctiva/sclera: Conjunctivae normal.     Pupils: Pupils are equal, round, and reactive to light.  Neck:     Musculoskeletal: Normal range of motion and neck supple.  Cardiovascular:     Rate and Rhythm: Normal rate and regular rhythm.     Pulses: Normal pulses.     Heart sounds: Normal heart sounds. No murmur.  Pulmonary:     Effort: Pulmonary effort is normal. No respiratory  distress.     Breath sounds: Normal breath sounds. No wheezing, rhonchi or rales.  Abdominal:     General: Abdomen is flat. There is no distension.     Palpations: Abdomen is soft. There is no mass.     Tenderness: There is no abdominal tenderness. There is no guarding or rebound.     Hernia: No hernia is present.  Musculoskeletal:     Right lower leg: No edema.     Left lower leg: No edema.  Skin:    General: Skin is warm and dry.     Findings: Erythema and rash present.          Comments: 7.5x6.5 cm non-tender non-pruritic erythematous patch R anterior proximal forearm with central puncture mark, erythema delineated.   Neurological:     Mental Status: She is alert.       Lab Results  Component Value Date   CREATININE 0.72 07/16/2018   BUN 8 07/16/2018   NA 143 07/16/2018   K 4.1 07/16/2018   CL 106 07/16/2018   CO2 24 07/16/2018    Lab Results  Component Value Date   TSH 2.740 07/16/2018    Assessment & Plan:   Problem List Items Addressed This Visit    Bug bite with infection - Primary    To R forearm with surrounding cellulitic changes vs localized reaction to bite (although anticipate more infective given streaking noted). I think bactrim course is contributing to several of her symptoms so did recommend switching to augmentin course (which she has tolerated well in the past). Anticipate dizziness stemming from mild dehydration with orthostatic changes noted in BP. Update with effect.       Relevant Medications   sulfamethoxazole-trimethoprim (BACTRIM DS) 800-160 MG tablet       Meds ordered this encounter  Medications   amoxicillin-clavulanate (AUGMENTIN) 875-125 MG tablet    Sig: Take 1 tablet by mouth 2 (two) times daily for 10 days.    Dispense:  14 tablet    Refill:  0   No orders of the defined types were placed in this encounter.   Follow up plan: No follow-ups on file.  Caroline Bush, MD

## 2019-06-04 NOTE — Patient Instructions (Addendum)
I don't think this is tick bite. Other insect bite. Stop bactrim. Start augmentin antibiotic sent to pharmacy.  Push fluids, with gatorade to stay well hydrated to help with dizziness.  Let us know if redness spreading or symptoms not improving with antibiotic change.

## 2019-06-20 ENCOUNTER — Telehealth: Payer: Self-pay | Admitting: Family Medicine

## 2019-06-20 ENCOUNTER — Encounter: Payer: Self-pay | Admitting: Family Medicine

## 2019-06-20 ENCOUNTER — Ambulatory Visit: Payer: BC Managed Care – PPO | Admitting: Family Medicine

## 2019-06-20 ENCOUNTER — Ambulatory Visit: Payer: BC Managed Care – PPO | Admitting: Primary Care

## 2019-06-20 ENCOUNTER — Other Ambulatory Visit: Payer: Self-pay

## 2019-06-20 VITALS — BP 90/68 | HR 86 | Temp 98.7°F | Resp 18 | Wt 138.4 lb

## 2019-06-20 DIAGNOSIS — I951 Orthostatic hypotension: Secondary | ICD-10-CM | POA: Insufficient documentation

## 2019-06-20 DIAGNOSIS — S50861A Insect bite (nonvenomous) of right forearm, initial encounter: Secondary | ICD-10-CM

## 2019-06-20 DIAGNOSIS — W57XXXD Bitten or stung by nonvenomous insect and other nonvenomous arthropods, subsequent encounter: Secondary | ICD-10-CM | POA: Diagnosis not present

## 2019-06-20 DIAGNOSIS — R2681 Unsteadiness on feet: Secondary | ICD-10-CM | POA: Insufficient documentation

## 2019-06-20 DIAGNOSIS — R42 Dizziness and giddiness: Secondary | ICD-10-CM | POA: Diagnosis not present

## 2019-06-20 HISTORY — DX: Orthostatic hypotension: I95.1

## 2019-06-20 NOTE — Telephone Encounter (Signed)
error 

## 2019-06-20 NOTE — Patient Instructions (Signed)
Drink enough fluid to keep your urine clear.  Okay to be liberal with salt intake.    Labs when possible.   I'll update Dr. Darnell Level.   Take care.  Glad to see you.  The rash should gradually fade more.

## 2019-06-20 NOTE — Progress Notes (Signed)
She has h/o lower BP at baseline.    She had rash at the antecubital area on the R arm, clearly better than prev but more prominent episodically now with exercise.  She is off antibiotics now.    Dizziness described as presyncope and faint feeling, this has been going on since the last OV, some days worse than others.  No syncope except for an episode years ago. She felt a little better in the last week with slightly higher salt diet.    Meds, vitals, and allergies reviewed.   ROS: Per HPI unless specifically indicated in ROS section   GEN: nad, alert and oriented HEENT:ncat NECK: supple w/o LA CV: rrr. PULM: ctab, no inc wob ABD: soft, +bs EXT: no edema SKIN: Faint rash/postinflammatory changes noted in the right antecubital area.

## 2019-06-22 NOTE — Assessment & Plan Note (Signed)
She has a history of lower blood pressure at baseline.  Advised her to continue liberal salt intake.  Advised to drink enough fluid to keep her urine clear.  Check cmet CBC, TSH T4.  Lab slip given to patient. Routed to PCP as FYI.  She is going to have labs drawn at outside facility.

## 2019-06-22 NOTE — Assessment & Plan Note (Signed)
The bug bite appears to be resolving issue.  She has some slight postinflammatory hyperpigmentation but I think this is going to resolve and should not be an issue.

## 2019-06-23 ENCOUNTER — Other Ambulatory Visit: Payer: Self-pay | Admitting: Family Medicine

## 2019-06-23 DIAGNOSIS — R42 Dizziness and giddiness: Secondary | ICD-10-CM | POA: Diagnosis not present

## 2019-06-24 LAB — COMPREHENSIVE METABOLIC PANEL
ALT: 5 IU/L (ref 0–32)
AST: 14 IU/L (ref 0–40)
Albumin/Globulin Ratio: 2.4 — ABNORMAL HIGH (ref 1.2–2.2)
Albumin: 4.7 g/dL (ref 3.9–5.0)
Alkaline Phosphatase: 59 IU/L (ref 39–117)
BUN/Creatinine Ratio: 10 (ref 9–23)
BUN: 8 mg/dL (ref 6–20)
Bilirubin Total: 0.3 mg/dL (ref 0.0–1.2)
CO2: 23 mmol/L (ref 20–29)
Calcium: 9.5 mg/dL (ref 8.7–10.2)
Chloride: 104 mmol/L (ref 96–106)
Creatinine, Ser: 0.78 mg/dL (ref 0.57–1.00)
GFR calc Af Amer: 120 mL/min/{1.73_m2} (ref >59)
GFR calc non Af Amer: 105 mL/min/{1.73_m2} (ref >59)
Globulin, Total: 2 g/dL (ref 1.5–4.5)
Glucose: 87 mg/dL (ref 65–99)
Potassium: 4.2 mmol/L (ref 3.5–5.2)
Sodium: 142 mmol/L (ref 134–144)
Total Protein: 6.7 g/dL (ref 6.0–8.5)

## 2019-06-24 LAB — CBC WITH DIFFERENTIAL/PLATELET
Basophils Absolute: 0 10*3/uL (ref 0.0–0.2)
Basos: 1 %
EOS (ABSOLUTE): 0.1 10*3/uL (ref 0.0–0.4)
Eos: 1 %
Hematocrit: 40.1 % (ref 34.0–46.6)
Hemoglobin: 13.2 g/dL (ref 11.1–15.9)
Immature Grans (Abs): 0 10*3/uL (ref 0.0–0.1)
Immature Granulocytes: 0 %
Lymphocytes Absolute: 2.9 10*3/uL (ref 0.7–3.1)
Lymphs: 38 %
MCH: 28.1 pg (ref 26.6–33.0)
MCHC: 32.9 g/dL (ref 31.5–35.7)
MCV: 85 fL (ref 79–97)
Monocytes Absolute: 0.6 10*3/uL (ref 0.1–0.9)
Monocytes: 7 %
Neutrophils Absolute: 4.2 10*3/uL (ref 1.4–7.0)
Neutrophils: 53 %
Platelets: 254 10*3/uL (ref 150–450)
RBC: 4.7 x10E6/uL (ref 3.77–5.28)
RDW: 12.9 % (ref 11.7–15.4)
WBC: 7.7 10*3/uL (ref 3.4–10.8)

## 2019-06-24 LAB — TSH: TSH: 3.01 u[IU]/mL (ref 0.450–4.500)

## 2019-06-24 LAB — SPECIMEN STATUS REPORT

## 2019-06-24 LAB — T4, FREE: Free T4: 1.16 ng/dL (ref 0.82–1.77)

## 2019-06-26 ENCOUNTER — Telehealth: Payer: Self-pay

## 2019-06-26 NOTE — Telephone Encounter (Signed)
Pt left v/m requesting lab results from 06/20/19 when available. I do not see labs under lab tab or media tab.

## 2019-06-26 NOTE — Telephone Encounter (Signed)
I agree with Dr Darnell Level.  I had just gotten then hard copy prior to getting a copy in the EMR.  I had made notes on that with the same message.  I routed this to Lewis so she'll know not to call patient at this point.  Thanks.

## 2019-06-26 NOTE — Telephone Encounter (Signed)
Spoke with pt asking if she had labs drawn.  Says she had them done at [facility] and told them to send results to Dr. Willaim Sheng.  Says she will give them a call to make sure they fax [place] the results.

## 2019-06-26 NOTE — Telephone Encounter (Addendum)
I can see labs - all reassuringly ok - thyroid, kidneys, liver, blood counts, and sugar.  Keep working on good hydration status, ok to add a little salt to diet to see if it will help.

## 2019-06-26 NOTE — Telephone Encounter (Signed)
Spoke with pt relaying results and message per Dr. Darnell Level.  Pt verbalizes understanding.  She asks how long should she let this go on before contacting Dr. Darnell Level.

## 2019-07-24 ENCOUNTER — Ambulatory Visit (INDEPENDENT_AMBULATORY_CARE_PROVIDER_SITE_OTHER): Payer: BC Managed Care – PPO | Admitting: Family Medicine

## 2019-07-24 ENCOUNTER — Encounter: Payer: Self-pay | Admitting: Family Medicine

## 2019-07-24 VITALS — Ht 67.0 in | Wt 136.0 lb

## 2019-07-24 DIAGNOSIS — E348 Other specified endocrine disorders: Secondary | ICD-10-CM | POA: Diagnosis not present

## 2019-07-24 DIAGNOSIS — E559 Vitamin D deficiency, unspecified: Secondary | ICD-10-CM

## 2019-07-24 DIAGNOSIS — R42 Dizziness and giddiness: Secondary | ICD-10-CM

## 2019-07-24 DIAGNOSIS — W57XXXS Bitten or stung by nonvenomous insect and other nonvenomous arthropods, sequela: Secondary | ICD-10-CM

## 2019-07-24 DIAGNOSIS — R9082 White matter disease, unspecified: Secondary | ICD-10-CM

## 2019-07-24 DIAGNOSIS — M791 Myalgia, unspecified site: Secondary | ICD-10-CM

## 2019-07-24 NOTE — Progress Notes (Signed)
Virtual visit completed through [employer]. Due to national recommendations of social distancing due to COVID-19, a virtual visit is felt to be most appropriate for this patient at this time. Reviewed limitations of a virtual visit.   Patient location: home  Provider location: [place] [facility], office If any vitals were documented, they were collected by patient at home unless specified below.    Ht 5\' 7"  (1.702 m)   Wt 136 lb (61.7 kg)   LMP 07/24/2019   BMI 21.30 kg/m    CC: multiple concerns Subjective:    Patient ID: Para March, female    DOB: 21-Feb-1991, 28 y.o.   MRN: [id number]  HPI: Caroline [name] is a 28 y.o. female presenting on 07/24/2019 for Dizziness (C/o of several sxs that started early June 2020 and are not improving, especially the balance/gait issue occurring when rotating head left and right.  Sxs consist of dizziness, gait/balance issue, heat intolerance, sporadic drop in HR, fatigue, spasms in bilateral feet and now insomnia.  )   Seen by Dr Damita Dunnings last month with lightheadedness, s/p reassuring labwork which was again reviewed.   Ongoing trouble with heat intolerance, fatigue, unsteady gait - to point of not being able to go shopping. Unsteadiness worse with head movements from side to side. Feels lightheaded when she tries to walk around neighborhood at night. Muscular twitches. Has stopped driving due to motion sickness with head movements. Noticing some slow heart rates associated with fatigue. Lowest reading was only 62. Some HAs.   Minimal caffeine intake (1 cup in am) - despite this having trouble sleeping at night. Latest episode was 2 nights ago - unable to fall asleep until 6am. Tries to have bedtime routine - bedtime at 11pm. Wakes up at 8am. Separates several hours between dinner from bedtime.   This all may have started after insect bite - did not see what bit her. Did have rash around bug bite that largely resolved - in the heat rash may return.   No fevers, memory trouble, confusion, slurred speech, numbness or paresthesias. No loss of taste, ST, nausea, diarrhea, abd pain, cough or dyspnea. No known covid exposure.   Endorses h/o seizures in the past - last remotely in 2007. "Seizures" described as twitching starting in R foot then feels body locking up, then passing out. Episodes have been witnessed. No convulsing with seizure. No bladder accidents, tongue biting, not post ictal but did feel groggy. Did see neurologist Dr [name] (last 2017). She had MRI revealing incidental pineal cyst. Had foot neuroma removed 2007 - episodes resolved after this.      Relevant past medical, surgical, family and social history reviewed and updated as indicated. Interim medical history since our last visit reviewed. Allergies and medications reviewed and updated. Outpatient Medications Prior to Visit  Medication Sig Dispense Refill  . cetirizine (ZYRTEC) 10 MG tablet Take 10 mg by mouth daily as needed for allergies.    . Cholecalciferol (VITAMIN D) 2000 units CAPS Take 1 capsule (2,000 Units total) by mouth daily. 30 capsule   . Multiple Vitamins-Calcium (ONE-A-DAY WOMENS FORMULA PO) Take 1 tablet by mouth daily.    . polyethylene glycol (MIRALAX / GLYCOLAX) packet Take 17 g by mouth daily. As needed    . triamcinolone (NASACORT) 55 MCG/ACT AERO nasal inhaler Place 2 sprays into the nose daily. 1 Inhaler   . vitamin B-12 (CYANOCOBALAMIN) 1000 MCG tablet Take 1 tablet (1,000 mcg total) by mouth daily.  No facility-administered medications prior to visit.      Per HPI unless specifically indicated in ROS section below Review of Systems Objective:    Ht 5\' 7"  (1.702 m)   Wt 136 lb (61.7 kg)   LMP 07/24/2019   BMI 21.30 kg/m   Wt Readings from Last 3 Encounters:  07/24/19 136 lb (61.7 kg)  06/20/19 138 lb 7 oz (62.8 kg)  06/04/19 143 lb (64.9 kg)     Physical exam: Gen: alert, NAD, not ill appearing Pulm: speaks in complete sentences  without increased work of breathing Psych: normal mood, normal thought content      Results for orders placed or performed in visit on 06/23/19  CBC with Differential/Platelet  Result Value Ref Range   WBC 7.7 3.4 - 10.8 x10E3/uL   RBC 4.70 3.77 - 5.28 x10E6/uL   Hemoglobin 13.2 11.1 - 15.9 g/dL   Hematocrit 40.1 34.0 - 46.6 %   MCV 85 79 - 97 fL   MCH 28.1 26.6 - 33.0 pg   MCHC 32.9 31.5 - 35.7 g/dL   RDW 12.9 11.7 - 15.4 %   Platelets 254 150 - 450 x10E3/uL   Neutrophils 53 Not Estab. %   Lymphs 38 Not Estab. %   Monocytes 7 Not Estab. %   Eos 1 Not Estab. %   Basos 1 Not Estab. %   Neutrophils Absolute 4.2 1.4 - 7.0 x10E3/uL   Lymphocytes Absolute 2.9 0.7 - 3.1 x10E3/uL   Monocytes Absolute 0.6 0.1 - 0.9 x10E3/uL   EOS (ABSOLUTE) 0.1 0.0 - 0.4 x10E3/uL   Basophils Absolute 0.0 0.0 - 0.2 x10E3/uL   Immature Granulocytes 0 Not Estab. %   Immature Grans (Abs) 0.0 0.0 - 0.1 x10E3/uL  Comprehensive metabolic panel  Result Value Ref Range   Glucose 87 65 - 99 mg/dL   BUN 8 6 - 20 mg/dL   Creatinine, Ser 0.78 0.57 - 1.00 mg/dL   GFR calc non Af Amer 105 >59 mL/min/1.73   GFR calc Af Amer 120 >59 mL/min/1.73   BUN/Creatinine Ratio 10 9 - 23   Sodium 142 134 - 144 mmol/L   Potassium 4.2 3.5 - 5.2 mmol/L   Chloride 104 96 - 106 mmol/L   CO2 23 20 - 29 mmol/L   Calcium 9.5 8.7 - 10.2 mg/dL   Total Protein 6.7 6.0 - 8.5 g/dL   Albumin 4.7 3.9 - 5.0 g/dL   Globulin, Total 2.0 1.5 - 4.5 g/dL   Albumin/Globulin Ratio 2.4 (H) 1.2 - 2.2   Bilirubin Total 0.3 0.0 - 1.2 mg/dL   Alkaline Phosphatase 59 39 - 117 IU/L   AST 14 0 - 40 IU/L   ALT 5 0 - 32 IU/L  T4, free  Result Value Ref Range   Free T4 1.16 0.82 - 1.77 ng/dL  TSH  Result Value Ref Range   TSH 3.010 0.450 - 4.500 uIU/mL  Specimen status report  Result Value Ref Range   specimen status report Comment    MR BRAIN W WO CONTRAST Narrative:   [facility] [address] [place], [place]  [address] [phone]  NEUROIMAGING REPORT  STUDY DATE: 12/05/2016 PATIENT NAME: PRICILLA MOEHLE DOB: [birth year] MRN: [id number]  EXAM: MRI Brain with and without contrast  ORDERING CLINICIAN: Sarina Ill M.D. CLINICAL HISTORY: 28 year old woman with a pineal cyst COMPARISON FILMS: MRI 12/14/2015  TECHNIQUE: MRI of the brain with and without contrast was obtained  utilizing 5 mm axial  slices with T1, T2, T2 flair, T2 star gradient echo  and diffusion weighted views.  T1 sagittal, T2 coronal and postcontrast  views in the axial and coronal plane were obtained. CONTRAST: [facility] SITE: [facility], [address]  FINDINGS: On sagittal images, the spinal cord is imaged caudally to C4 and  is normal in caliber.   The contents of the posterior fossa are of normal  size and position.   The pituitary gland and optic chiasm appear normal.  There is an 8-9 mm benign-appearing pineal cyst. It is unchanged when  compared to the 12/14/2015 MRI.   Brain volume appears normal.   The  ventricles are normal in size and without distortion.  There are no  abnormal extra-axial collections of fluid.    The cerebellum and brainstem appears normal.   The deep gray matter  appears normal.  There is a single small T2/FLAIR hyperintense focus in  the deep white matter of the right frontal lobe. It appears unchanged when  compared to the 12/14/2015 MRI.Marland Kitchen  Diffusion weighted images are normal.   Gradient echo heme weighted images are normal.     The orbits appear normal.   The VIIth/VIIIth nerve complex appears normal.   The mastoid air cells appear normal.  The paranasal sinuses appear  normal.  Flow voids are identified within the major intracerebral  arteries.     After the infusion of contrast material, a normal enhancement pattern is  noted. Impression:  This MRI of the brain with and without contrast shows the  following: 1.   Small T2/FLAIR hyperintense focus in the  deep white matter of the  right frontal lobe, unchanged in appearance when compared to the  12/14/2015 MRI. This is a nonspecific finding and is unlikely to be of  clinical significance in isolation. 2.    8-9 mm benign-appearing pineal cyst, unchanged when compared to the  2016 MRI.  INTERPRETING PHYSICIAN:  Richard A. Felecia Shelling, MD, PhD Certified in  Neuroimaging by [place] [employer]   Assessment & Plan:   Problem List Items Addressed This Visit    White matter abnormality on MRI of brain   Relevant Orders   CBC with Differential/Platelet   Ambulatory referral to Neurology   Vitamin D deficiency   Relevant Orders   VITAMIN D 25 Hydroxy (Vit-D Deficiency, Fractures)   Pineal gland cyst   Relevant Orders   Ambulatory referral to Neurology   Lightheaded - Primary    Multiple concerns today including dizziness, heat intolerance, fatigue, unsteadiness, muscle twitching, headaches, motion sickness in car, insomnia and generalized malaise. Noted weight loss over last several months. Largely unclear cause. Endorses remote h/o seizure episodes but unclear if truly seizure (not postictal, no bladder incontinence of tongue biting).  Did recommend labwork. Will go next week to [facility] draw [facility].  Will refer back to Dr [name] for multiple neurological concerns (never followed up 2017).  If unable to expedite neurology eval, I did recommend in-office exam/eval.       Relevant Orders   CBC with Differential/Platelet   Vitamin B12   CK   ANA   Ambulatory referral to Neurology   Bug bite with infection    Will check for tick borne illness.       Relevant Orders   Rocky mtn spotted fvr abs pnl(IgG+IgM)   Lyme Ab/Western Blot Reflex    Other Visit Diagnoses    Myalgia       Relevant Orders  CK   ANA       No orders of the defined types were placed in this encounter.  Orders Placed This Encounter  Procedures  . CBC with Differential/Platelet    Standing Status:    Future    Standing Expiration Date:   07/25/2020  . Vitamin B12    Standing Status:   Future    Standing Expiration Date:   07/25/2020  . VITAMIN D 25 Hydroxy (Vit-D Deficiency, Fractures)    Standing Status:   Future    Standing Expiration Date:   07/25/2020  . CK    Standing Status:   Future    Standing Expiration Date:   07/25/2020  . Rocky mtn spotted fvr abs pnl(IgG+IgM)    Standing Status:   Future    Standing Expiration Date:   07/25/2020  . ANA    Standing Status:   Future    Standing Expiration Date:   07/25/2020  . Lyme Ab/Western Blot Reflex    Standing Status:   Future    Standing Expiration Date:   07/25/2020  . Ambulatory referral to Neurology    Referral Priority:   Routine    Referral Type:   Consultation    Referral Reason:   Specialty Services Required    Requested Specialty:   Neurology    Number of Visits Requested:   1    I discussed the assessment and treatment plan with the patient. The patient was provided an opportunity to ask questions and all were answered. The patient agreed with the plan and demonstrated an understanding of the instructions. The patient was advised to call back or seek an in-person evaluation if the symptoms worsen or if the condition fails to improve as anticipated.  Follow up plan: No follow-ups on file.  [name], MD

## 2019-07-26 ENCOUNTER — Encounter: Payer: Self-pay | Admitting: Family Medicine

## 2019-07-26 NOTE — Assessment & Plan Note (Addendum)
Multiple concerns today including dizziness, heat intolerance, fatigue, unsteadiness, muscle twitching, headaches, motion sickness in car, insomnia and generalized malaise. Noted weight loss over last several months. Largely unclear cause. Endorses remote h/o seizure episodes but unclear if truly seizure (not postictal, no bladder incontinence of tongue biting).  Did recommend labwork. Will go next week to [facility].  Will refer back to Dr [name] for multiple neurological concerns (never followed up 2017).  If unable to expedite neurology eval, I did recommend in-office exam/eval.

## 2019-07-26 NOTE — Assessment & Plan Note (Signed)
Will check for tick borne illness.

## 2019-08-06 DIAGNOSIS — E559 Vitamin D deficiency, unspecified: Secondary | ICD-10-CM | POA: Diagnosis not present

## 2019-08-06 DIAGNOSIS — W57XXXS Bitten or stung by nonvenomous insect and other nonvenomous arthropods, sequela: Secondary | ICD-10-CM | POA: Diagnosis not present

## 2019-08-06 DIAGNOSIS — R42 Dizziness and giddiness: Secondary | ICD-10-CM | POA: Diagnosis not present

## 2019-08-06 DIAGNOSIS — M791 Myalgia, unspecified site: Secondary | ICD-10-CM | POA: Diagnosis not present

## 2019-08-08 LAB — ROCKY MTN SPOTTED FVR ABS PNL(IGG+IGM)
RMSF IgG: NEGATIVE
RMSF IgM: 0.55 index (ref 0.00–0.89)

## 2019-08-08 LAB — CBC WITH DIFFERENTIAL/PLATELET
Basophils Absolute: 0 10*3/uL (ref 0.0–0.2)
Basos: 1 %
EOS (ABSOLUTE): 0.1 10*3/uL (ref 0.0–0.4)
Eos: 1 %
Hematocrit: 39.1 % (ref 34.0–46.6)
Hemoglobin: 13 g/dL (ref 11.1–15.9)
Immature Grans (Abs): 0 10*3/uL (ref 0.0–0.1)
Immature Granulocytes: 0 %
Lymphocytes Absolute: 2.6 10*3/uL (ref 0.7–3.1)
Lymphs: 38 %
MCH: 28.6 pg (ref 26.6–33.0)
MCHC: 33.2 g/dL (ref 31.5–35.7)
MCV: 86 fL (ref 79–97)
Monocytes Absolute: 0.5 10*3/uL (ref 0.1–0.9)
Monocytes: 7 %
Neutrophils Absolute: 3.7 10*3/uL (ref 1.4–7.0)
Neutrophils: 53 %
Platelets: 251 10*3/uL (ref 150–450)
RBC: 4.55 x10E6/uL (ref 3.77–5.28)
RDW: 12.5 % (ref 11.7–15.4)
WBC: 6.9 10*3/uL (ref 3.4–10.8)

## 2019-08-08 LAB — ANA: Anti Nuclear Antibody (ANA): NEGATIVE

## 2019-08-08 LAB — LYME AB/WESTERN BLOT REFLEX
LYME DISEASE AB, QUANT, IGM: <0.8 index (ref 0.00–0.79)
Lyme IgG/IgM Ab: <0.91 {ISR} (ref 0.00–0.90)

## 2019-08-08 LAB — CK: Total CK: 33 U/L (ref 32–182)

## 2019-08-08 LAB — VITAMIN D 25 HYDROXY (VIT D DEFICIENCY, FRACTURES): Vit D, 25-Hydroxy: 29.7 ng/mL — ABNORMAL LOW (ref 30.0–100.0)

## 2019-08-08 LAB — VITAMIN B12: Vitamin B-12: 458 pg/mL (ref 232–1245)

## 2019-09-17 ENCOUNTER — Encounter: Payer: Self-pay | Admitting: Neurology

## 2019-09-17 ENCOUNTER — Ambulatory Visit (INDEPENDENT_AMBULATORY_CARE_PROVIDER_SITE_OTHER): Payer: BC Managed Care – PPO | Admitting: Neurology

## 2019-09-17 ENCOUNTER — Other Ambulatory Visit: Payer: Self-pay

## 2019-09-17 ENCOUNTER — Encounter

## 2019-09-17 VITALS — BP 100/62 | HR 100 | Temp 97.1°F | Ht 67.0 in | Wt 139.0 lb

## 2019-09-17 DIAGNOSIS — R55 Syncope and collapse: Secondary | ICD-10-CM | POA: Diagnosis not present

## 2019-09-17 NOTE — Patient Instructions (Addendum)
What is vasovagal syncope?  Vasovagal syncope is a condition that leads to fainting in some people. It is also called reflex syncope. It's the most common cause of fainting. It's usually not harmful nor a sign of a more serious problem.  Many nerves connect with your heart and blood vessels. These nerves help control the speed and force of your heartbeat. They also regulate blood pressure by controlling whether your blood vessels widen or tighten. Usually, these nerves coordinate their actions so you always get enough blood to your brain. Under certain situations, these nerves might give an inappropriate signal. This might cause your blood vessels to open wide. At the same time, your heartbeat may slow down. Blood can pool in your legs which leads to a drop in blood pressure, and not enough of it may reach the brain. If that happens, you may briefly lose consciousness. When you lie or fall down, blood flow to the brain resumes.  Vasovagal syncope is quite common. It most often affects children and young adults, but it can happen at any age. It happens to men and women in about equal numbers. Unlike some other causes of fainting, vasovagal syncope does not signal an underlying problem with the heart or brain.  What causes vasovagal syncope? Several triggers can cause vasovagal syncope. To help reduce the risk of fainting, you can stay away from some of these triggers such as:  Standing for long periods Excess heat Intense emotion, such as fear Intense pain The sight of blood or a needle Prolonged exercise Dehydration Skipping meals Other triggers include:  Urinating Swallowing Coughing Having a bowel movement What are the symptoms of vasovagal syncope? Fainting is the defining symptom of vasovagal syncope. Often you may have certain symptoms before actually fainting such as:   Nausea Warmth Turning pale Getting sweaty palms Feeling dizzy or lightheaded Blurred vision If you can lie  down at the first sign of these symptoms, you will often be able to prevent fainting. When it happens, this type of fainting almost always happens in a sitting or standing position. Not everyone notices symptoms before fainting, however.  When a person does faint, lying down restores blood flow to the brain. Consciousness should return fairly quickly. You might not feel normal for a little while after you faint. You might feel depressed or fatigued for a short time. Some people even feel nauseous and may vomit.  Some people have only 1 or 2 episodes of vasovagal syncope in their life. For others, the problem is more chronic and happens with no warning.  How is vasovagal syncope diagnosed? Your doctor will review your medical history and do a physical exam. This will probably include measuring the blood pressure while lying down, seated, and then standing. Your doctor will likely do an electrocardiogram (ECG) as well, to evaluate the heart's rhythm. For many children and young adults, this may be all that is needed. Usually, the doctor can safely assume that the fainting is due to vasovagal syncope, and not some form of syncope that is more dangerous.  Sometimes the doctor needs to check for other possible causes for fainting. Because some causes of fainting are dangerous, the doctor will want to rule out these other causes. Your doctor might use tests such as the following:  Continuous portable ECG monitoring, to further analyze heart rhythms Echocardiogram, to examine blood flow in the heart and heart motion Exercise stress testing, to see how your heart works during exercise Blood work, only if  your doctor is suspicious for an abnormality If these tests are normal, you might need something called a "tilt table test." For this test, you lie down on a padded table. Someone measures your heart rate and blood pressure while you are lying down and then tilted up for a period of time. Sometime medicine is  also given to trigger a fainting response. If you have vasovagal syncope, you may faint during the upward tilt.  How is vasovagal syncope treated? Watch for the warning signs of vasovagal syncope, like dizziness, nausea, or sweaty palms. If you have a history of vasovagal syncope and think you are about to faint, lie down right away. Tensing your arms or crossing your legs can help prevent fainting. Passively raising or propping up your legs in the air can also help.  To immediately treat someone who has fainted from vasovagal syncope, help the person lie down and lift his or her legs up in the air. This will restore blood flow to the brain, and the person should quickly regain consciousness. The person should lie down for a little while afterwards.  If you have had episodes of vasovagal syncope, your doctor might make some suggestions on how to help prevent fainting. These might include:  Avoiding triggers, such as standing for a long time or the sight of blood Moderate exercise training Discontinuing medicines that lower blood pressure, like diuretics Eating a higher salt diet, to help keep up blood volume Drinking plenty of fluids, to maintain blood volume Wearing compression stockings or abdominal binders Occasionally, you may need medicine to help control vasovagal syncope. However, research on these medicines has revealed uncertain benefits in vasovagal syncope. These are usually only considered when a person has multiple episodes of fainting. Some of the medicines your doctor may advise a trial of include:  Alpha-1-adrenergic agonists, to increase blood pressure Corticosteroids, to help increase the sodium and fluid levels Serotonin reuptake inhibitors (SSRIs), to moderate the nervous system response If these medicines are ineffective, doctors sometimes try orthostatic training. This method uses a tilt table to gradually increase the amount of time spent upright. Rarely, in cases where a  significant slowing of the heartbeat or pausing is detected, a heart pacemaker is needed.  What are possible complications of vasovagal syncope? Vasovagal syncope itself is generally not dangerous. Of course, fainting can be dangerous if it happens at certain times, like while driving. Most people with rare episodes of vasovagal syncope can drive safely. If you have chronic syncope that is not under control, your doctor may advise against driving. This is especially likely if you don't usually have warning signs before you faint. Ask your doctor about what is safe for you to do.  When should I call my healthcare provider? See a doctor right away if you have recurrent episodes of passing out or other related problems.  Key points about vasovagal syncope Vasovagal syncope is the most common cause of fainting. It happens when the blood vessels open too wide and/or the heartbeat slows, causing a temporary lack of blood flow to the brain. It's generally not a dangerous condition. To prevent fainting, stay out of hot places and don't stand for long periods. If you feel lightheaded, nauseous, or sweaty, lie down right away and raise your legs. Most people with occasional vasovagal syncope need to make only lifestyle changes such as drinking more fluids and eating more salt. Some people may need medicine or even a heart pacemaker. Next steps Tips to help you  get the most from a visit to your healthcare provider:  Know the reason for your visit and what you want to happen. Before your visit, write down questions you want answered. Bring someone with you to help you ask questions and remember what your provider tells you. At the visit, write down the name of a new diagnosis, and any new medicines, treatments, or tests. Also write down any new instructions your provider gives you. Know why a new medicine or treatment is prescribed, and how it will help you. Also know what the side effects are. Ask if  your condition can be treated in other ways. Know why a test or procedure is recommended and what the results could mean. Know what to expect if you do not take the medicine or have the test or procedure. If you have a follow-up appointment, write down the date, time, and purpose for that visit. Know how you can contact your provider if you have questions. Orthostatic Hypotension Blood pressure is a measurement of how strongly, or weakly, your blood is pressing against the walls of your arteries. Orthostatic hypotension is a sudden drop in blood pressure that happens when you quickly change positions, such as when you get up from sitting or lying down. Arteries are blood vessels that carry blood from your heart throughout your body. When blood pressure is too low, you may not get enough blood to your brain or to the rest of your organs. This can cause weakness, light-headedness, rapid heartbeat, and fainting. This can last for just a few seconds or for up to a few minutes. Orthostatic hypotension is usually not a serious problem. However, if it happens frequently or gets worse, it may be a sign of something more serious. What are the causes? This condition may be caused by:  Sudden changes in posture, such as standing up quickly after you have been sitting or lying down.  Blood loss.  Loss of body fluids (dehydration).  Heart problems.  Hormone (endocrine) problems.  Pregnancy.  Severe infection.  Lack of certain nutrients.  Severe allergic reactions (anaphylaxis).  Certain medicines, such as blood pressure medicine or medicines that make the body lose excess fluids (diuretics). Sometimes, this condition can be caused by not taking medicine as directed, such as taking too much of a certain medicine. What increases the risk? The following factors may make you more likely to develop this condition:  Age. Risk increases as you get older.  Conditions that affect the heart or the  central nervous system.  Taking certain medicines, such as blood pressure medicine or diuretics.  Being pregnant. What are the signs or symptoms? Symptoms of this condition may include:  Weakness.  Light-headedness.  Dizziness.  Blurred vision.  Fatigue.  Rapid heartbeat.  Fainting, in severe cases. How is this diagnosed? This condition is diagnosed based on:  Your medical history.  Your symptoms.  Your blood pressure measurement. Your health care provider will check your blood pressure when you are: ? Lying down. ? Sitting. ? Standing. A blood pressure reading is recorded as two numbers, such as "120 over 80" (or 120/80). The first ("top") number is called the systolic pressure. It is a measure of the pressure in your arteries as your heart beats. The second ("bottom") number is called the diastolic pressure. It is a measure of the pressure in your arteries when your heart relaxes between beats. Blood pressure is measured in a unit called mm Hg. Healthy blood pressure for most adults  is 120/80. If your blood pressure is below 90/60, you may be diagnosed with hypotension. Other information or tests that may be used to diagnose orthostatic hypotension include:  Your other vital signs, such as your heart rate and temperature.  Blood tests.  Tilt table test. For this test, you will be safely secured to a table that moves you from a lying position to an upright position. Your heart rhythm and blood pressure will be monitored during the test. How is this treated? This condition may be treated by:  Changing your diet. This may involve eating more salt (sodium) or drinking more water.  Taking medicines to raise your blood pressure.  Changing the dosage of certain medicines you are taking that might be lowering your blood pressure.  Wearing compression stockings. These stockings help to prevent blood clots and reduce swelling in your legs. In some cases, you may need to go  to the hospital for:  Fluid replacement. This means you will receive fluids through an IV.  Blood replacement. This means you will receive donated blood through an IV (transfusion).  Treating an infection or heart problems, if this applies.  Monitoring. You may need to be monitored while medicines that you are taking wear off. Follow these instructions at home: Eating and drinking   Drink enough fluid to keep your urine pale yellow.  Eat a healthy diet, and follow instructions from your health care provider about eating or drinking restrictions. A healthy diet includes: ? Fresh fruits and vegetables. ? Whole grains. ? Lean meats. ? Low-fat dairy products.  Eat extra salt only as directed. Do not add extra salt to your diet unless your health care provider told you to do that.  Eat frequent, small meals.  Avoid standing up suddenly after eating. Medicines  Take over-the-counter and prescription medicines only as told by your health care provider. ? Follow instructions from your health care provider about changing the dosage of your current medicines, if this applies. ? Do not stop or adjust any of your medicines on your own. General instructions   Wear compression stockings as told by your health care provider.  Get up slowly from lying down or sitting positions. This gives your blood pressure a chance to adjust.  Avoid hot showers and excessive heat as directed by your health care provider.  Return to your normal activities as told by your health care provider. Ask your health care provider what activities are safe for you.  Do not use any products that contain nicotine or tobacco, such as cigarettes, e-cigarettes, and chewing tobacco. If you need help quitting, ask your health care provider.  Keep all follow-up visits as told by your health care provider. This is important. Contact a health care provider if you:  Vomit.  Have diarrhea.  Have a fever for more than  2-3 days.  Feel more thirsty than usual.  Feel weak and tired. Get help right away if you:  Have chest pain.  Have a fast or irregular heartbeat.  Develop numbness in any part of your body.  Cannot move your arms or your legs.  Have trouble speaking.  Become sweaty or feel light-headed.  Faint.  Feel short of breath.  Have trouble staying awake.  Feel confused. Summary  Orthostatic hypotension is a sudden drop in blood pressure that happens when you quickly change positions.  Orthostatic hypotension is usually not a serious problem.  It is diagnosed by having your blood pressure taken lying down, sitting, and  then standing.  It may be treated by changing your diet or adjusting your medicines. This information is not intended to replace advice given to you by your health care provider. Make sure you discuss any questions you have with your health care provider. Document Released: 12/01/2002 Document Revised: 06/06/2018 Document Reviewed: 06/06/2018 [employer] Patient Education  2020 [employer].
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
## 2019-10-02 ENCOUNTER — Telehealth: Payer: Self-pay | Admitting: Family Medicine

## 2019-10-02 DIAGNOSIS — R42 Dizziness and giddiness: Secondary | ICD-10-CM

## 2019-10-02 DIAGNOSIS — R55 Syncope and collapse: Secondary | ICD-10-CM

## 2019-10-02 NOTE — Telephone Encounter (Signed)
Ok I've placed a referral.  In the interim, recommend increasing salt intake a moderate amount to see if it will help keep blood pressures stable. Ensure stays well hydrated as well.

## 2019-10-02 NOTE — Addendum Note (Signed)
Addended by: [name] on: 10/02/2019 02:24 PM   Modules accepted: Orders

## 2019-10-02 NOTE — Telephone Encounter (Signed)
Appt scheduled with Dr Claiborne Billings and patient is aware.

## 2019-10-02 NOTE — Telephone Encounter (Signed)
Noted  

## 2019-10-02 NOTE — Telephone Encounter (Signed)
Patient went to Dr. Coral Ceo, Neurologist. Patient has gait issues and passing out and can't stand or walk for a long period of time.  Patient's blood pressure is low.  Neurologist recommended patient be referred by her PCP to a Cardiologist to confirm his findings. Neurologist thinks patient may have Syncope or orthostatic hypotension. Patient would like to be referred to [facility].  Patient can go anytime.

## 2019-11-11 ENCOUNTER — Other Ambulatory Visit: Payer: Self-pay

## 2019-11-11 ENCOUNTER — Encounter: Payer: Self-pay | Admitting: Cardiovascular Disease

## 2019-11-11 ENCOUNTER — Ambulatory Visit: Payer: BC Managed Care – PPO | Admitting: Cardiovascular Disease

## 2019-11-11 VITALS — BP 117/83 | HR 74 | Temp 98.1°F | Ht 67.0 in | Wt 143.2 lb

## 2019-11-11 DIAGNOSIS — R42 Dizziness and giddiness: Secondary | ICD-10-CM | POA: Diagnosis not present

## 2019-11-11 DIAGNOSIS — R6889 Other general symptoms and signs: Secondary | ICD-10-CM | POA: Diagnosis not present

## 2019-11-11 DIAGNOSIS — E348 Other specified endocrine disorders: Secondary | ICD-10-CM

## 2019-11-11 DIAGNOSIS — R55 Syncope and collapse: Secondary | ICD-10-CM

## 2019-11-11 DIAGNOSIS — K599 Functional intestinal disorder, unspecified: Secondary | ICD-10-CM

## 2019-11-11 DIAGNOSIS — Z79899 Other long term (current) drug therapy: Secondary | ICD-10-CM

## 2019-11-11 NOTE — Patient Instructions (Signed)
Labwork: FASTING LIPID,CMP, CBC AND TSH HERE IN OUR OFFICE AT [facility]    If you have labs (blood work) drawn today and your tests are completely normal, you will receive your results only by: Marland Kitchen MyChart Message (if you have MyChart) OR . A paper copy in the mail If you have any lab test that is abnormal or we need to change your treatment, we will call you to review the results.  Testing/Procedures: Echocardiogram - Your physician has requested that you have an echocardiogram. Echocardiography is a painless test that uses sound waves to create images of your heart. It provides your doctor with information about the size and shape of your heart and how well your heart's chambers and valves are working. This procedure takes approximately one hour. There are no restrictions for this procedure. This will be performed at our [facility] location - [address].  Your physician has recommended that you wear a 14 DAY ZIO-PATCH monitor. The Zio patch cardiac monitor continuously records heart rhythm data for up to 14 days, this is for patients being evaluated for multiple types heart rhythms. For the first 24 hours post application, please avoid getting the Zio monitor wet in the shower or by excessive sweating during exercise. After that, feel free to carry on with regular activities. Keep soaps and lotions away from the ZIO XT Patch.  This will be mailed to you, please expect 7-10 days to receive.     Special Instructions: PLEASE PURCHASE AND WEAR COMPRESSION STOCKINGS DAILY AND OFF AT BEDTIME. Compression stockings are elastic socks that squeeze the legs. They help to increase blood flow to the legs and to decrease swelling in the legs from fluid retention, and reduce the chance of developing blood clots in the lower legs.   Follow-Up: IN 2 MONTHS.  In Person [name], MD-AFTER MONITOR AND ECHO.    At [facility], you and your health needs are our priority.  As part of our  continuing mission to provide you with exceptional heart care, we have created designated Provider Care Teams.  These Care Teams include your primary Cardiologist (physician) and Advanced Practice Providers (APPs -  Physician Assistants and Nurse Practitioners) who all work together to provide you with the care you need, when you need it.  Thank you for choosing [facility] at [facility]!!

## 2019-11-11 NOTE — Progress Notes (Signed)
Cardiology Office Note    Date:  11/18/2019   ID:  Caroline [name], DOB 1991/05/08, MRN [id number]  PCP:  [name], MD  Cardiologist:  [name], MD   New cardiology consultation referred by [name] for evaluation of episodic dizziness.  History of Present Illness:  Caroline [name] is a 28 y.o. female was followed by Dr. [name].  Over the past 6 months, she has had several episodes where she gets dizzy with difficulty with balance and gait, heat intolerance and fatigability.  In June, she states that she was bitten by some type of bug which ultimately resulted in significant rash and required several weeks of antibiotic therapy.  Subsequently, she helped significant heat intolerance she admits to being constantly cold.  Thyroid function studies have been normal.  Due to ongoing recurrent symptomatology she ultimately was referred for neurologic evaluation and saw [name].  Patient has a remote history of seizures with her last in 2007 described as twitching starting in her right foot and then feeling a sensation that her body is locking up.  During [name] evaluation he stated that the patient had multiple neurologic complaints including dizziness with out prior neurologic etiology and had been seen by multiple doctors.  An MRI had shown stable incidental pineal cyst and area of stable incidental T12 hyperintensity which was a small 2 mm right frontal periventricular focus of nonspecific gliosis.  After her most recent evaluation with [name], the feeling was most likely her dizziness was related to possible vasovagal versus orthostatic hypotension.  Her neurologic exam was normal.  She had a normal CBC, B12, Lyme, [place] spotted fever, CK, ANA, CMP, and TSH levels.    Patient states that when she gets dizziness she at times has a sensation of tunnel vision.  She is unaware of any significant arrhythmia prior to her events.  She is now referred for  cardiology evaluation.  Past Medical History:  Diagnosis Date  . Colonic inertia 2007   Dr [name] ([facility])  . History of chicken pox   . Influenza B 11/19/2018  . Seasonal allergic rhinitis     Past Surgical History:  Procedure Laterality Date  . COLONOSCOPY  2010   WNL [name])  . EXCISION MORTON'S NEUROMA Left 2007  . WISDOM TOOTH EXTRACTION  2010    Current Medications: Outpatient Medications Prior to Visit  Medication Sig Dispense Refill  . cetirizine (ZYRTEC) 10 MG tablet Take 10 mg by mouth daily as needed for allergies.    . Cholecalciferol (VITAMIN D) 2000 units CAPS Take 1 capsule (2,000 Units total) by mouth daily. 30 capsule   . Multiple Vitamins-Calcium (ONE-A-DAY WOMENS FORMULA PO) Take 1 tablet by mouth daily.    . polyethylene glycol (MIRALAX / GLYCOLAX) packet Take 17 g by mouth daily. As needed    . triamcinolone (NASACORT) 55 MCG/ACT AERO nasal inhaler Place 2 sprays into the nose daily. 1 Inhaler   . vitamin B-12 (CYANOCOBALAMIN) 1000 MCG tablet Take 1 tablet (1,000 mcg total) by mouth daily.     No facility-administered medications prior to visit.      Allergies:   Doxycycline and Linzess [linaclotide]   Social History   Socioeconomic History  . Marital status: Single    Spouse name: Not on file  . Number of children: 0  . Years of education: 48  . Highest education level: Bachelor's degree (e.g., BA, AB, BS)  Occupational History  . Not  on file  Social Needs  . Financial resource strain: Not on file  . Food insecurity    Worry: Not on file    Inability: Not on file  . Transportation needs    Medical: Not on file    Non-medical: Not on file  Tobacco Use  . Smoking status: Never Smoker  . Smokeless tobacco: Never Used  Substance and Sexual Activity  . Alcohol use: Yes    Comment: occasionally  . Drug use: No  . Sexual activity: Not on file  Lifestyle  . Physical activity    Days per week: Not on file    Minutes per session: Not on  file  . Stress: Not on file  Relationships  . Social Herbalist on phone: Not on file    Gets together: Not on file    Attends religious service: Not on file    Active member of club or organization: Not on file    Attends meetings of clubs or organizations: Not on file    Relationship status: Not on file  Other Topics Concern  . Not on file  Social History Narrative   Caffeine use: 1 cup coffee/day and some tea   Lives at home with mother, father, and sister and pets (horses, ducks, Sales promotion account executive, fish, wild [place])   Occ: Neurosurgeon, also student   Edu: bachelor's biochem [employer] and studying Health visitor   Activity: no regular exercise   Diet: good water, fruits/vegetables daily   Social history is notable in that she is single and lives at home.  She received a bachelor's degree at [employer] in biochemistry.  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  There is no history of tobacco use.  She does not exercise.  Family History:  The patient's family history includes Cancer in her paternal grandmother; Dementia in her maternal grandmother; Heart failure in her maternal grandmother; Hypertension in her father, maternal grandmother, and paternal grandmother; Hypothyroidism in her mother; Stroke in her maternal grandmother.   Her mother is living at age 63 but has had a history of stroke, father is living at age 48 and has mild CHF, COPD, atrial fibrillation and CAD.  She has 1 sister.  ROS General: Negative; No fevers, chills, or night sweats;  HEENT: Negative; No changes in vision or hearing, sinus congestion, difficulty swallowing Pulmonary: Negative; No cough, wheezing, shortness of breath, hemoptysis Cardiovascular: Negative; No chest pain, presyncope, syncope, palpitations GI: History of "colonic inertia." GU: Negative; No dysuria, hematuria, or difficulty voiding Musculoskeletal: Negative; no myalgias,  joint pain, or weakness Hematologic/Oncology: Negative; no easy bruising, bleeding Endocrine: Negative; no heat/cold intolerance; no diabetes Neuro: dizziness with tunnel vision Skin: Recent bug bite with significant rash and streaking of her arms in June 2020 Psychiatric: Negative; No behavioral problems, depression Sleep: Negative; No snoring, daytime sleepiness, hypersomnolence, bruxism, restless legs, hypnogognic hallucinations, no cataplexy Other comprehensive 14 point system review is negative.   PHYSICAL EXAM:   VS:  BP 117/83   Pulse 74   Temp 98.1 F (36.7 C)   Ht '5\' 7"'  (1.702 m)   Wt 143 lb 3.2 oz (65 kg)   SpO2 98%   BMI 22.43 kg/m     Blood pressure by me was 104/68 supine and 94/68 standing  Wt Readings from Last 3 Encounters:  11/11/19 143 lb 3.2 oz (65 kg)  09/17/19 139 lb (63 kg)  07/24/19 136 lb (61.7  kg)    General: Alert, oriented, no distress.  Skin: normal turgor, no rashes, warm and dry HEENT: Normocephalic, atraumatic. Pupils equal round and reactive to light; sclera anicteric; extraocular muscles intact;  Nose without nasal septal hypertrophy Mouth/Parynx benign; Mallinpatti scale 2 Neck: No JVD, no carotid bruits; normal carotid upstroke Lungs: clear to ausculatation and percussion; no wheezing or rales Chest wall: without tenderness to palpitation Heart: PMI not displaced, RRR, s1 s2 normal, 1/6 systolic murmur, no diastolic murmur, no rubs, gallops, thrills, or heaves Abdomen: soft, nontender; no hepatosplenomehaly, BS+; abdominal aorta nontender and not dilated by palpation. Back: no CVA tenderness Pulses 2+ Musculoskeletal: full range of motion, normal strength, no joint deformities Extremities: no clubbing cyanosis or edema, Homan's sign negative  Neurologic: grossly nonfocal; Cranial nerves grossly wnl Psychologic: Normal mood and affect   Studies/Labs Reviewed:   EKG:  EKG is ordered today.  ECG (independently read by me): Normal sinus  rhythm at 87 bpm.  No ectopy.  No significant ST-T changes.  Normal intervals with a PR segment at 116 ms and a QTC interval at 454 ms; no ectopy.  Recent Labs: BMP Latest Ref Rng & Units 06/23/2019 07/16/2018 06/07/2017  Glucose 65 - 99 mg/dL 87 85 81  BUN 6 - 20 mg/dL '8 8 7  ' Creatinine 0.57 - 1.00 mg/dL 0.78 0.72 0.69  BUN/Creat Ratio 9 - '23 10 11 10  ' Sodium 134 - 144 mmol/L 142 143 141  Potassium 3.5 - 5.2 mmol/L 4.2 4.1 4.4  Chloride 96 - 106 mmol/L 104 106 103  CO2 20 - 29 mmol/L '23 24 24  ' Calcium 8.7 - 10.2 mg/dL 9.5 9.5 9.8     Hepatic Function Latest Ref Rng & Units 06/23/2019 12/08/2015 01/11/2015  Total Protein 6.0 - 8.5 g/dL 6.7 6.6 6.9  Albumin 3.9 - 5.0 g/dL 4.7 4.6 4.2  AST 0 - 40 IU/L '14 15 20  ' ALT 0 - 32 IU/L '5 6 8  ' Alk Phosphatase 39 - 117 IU/L 59 57 52  Total Bilirubin 0.0 - 1.2 mg/dL 0.3 0.5 0.6    CBC Latest Ref Rng & Units 08/06/2019 06/23/2019 07/16/2018  WBC 3.4 - 10.8 x10E3/uL 6.9 7.7 6.6  Hemoglobin 11.1 - 15.9 g/dL 13.0 13.2 13.2  Hematocrit 34.0 - 46.6 % 39.1 40.1 42.5  Platelets 150 - 450 x10E3/uL 251 254 265   Lab Results  Component Value Date   MCV 86 08/06/2019   MCV 85 06/23/2019   MCV 86 07/16/2018   Lab Results  Component Value Date   TSH 3.010 06/23/2019   Lab Results  Component Value Date   HGBA1C 5.5 12/08/2015     BNP No results found for: BNP  ProBNP No results found for: PROBNP   Lipid Panel  No results found for: CHOL, TRIG, HDL, CHOLHDL, VLDL, LDLCALC, LDLDIRECT, LABVLDL   RADIOLOGY: No results found.   Additional studies/ records that were reviewed today include:  I reviewed the records of Dr. [name] as well as Dr. [name]    ASSESSMENT:    1. Orthostatic dizziness   2. Postural dizziness with presyncope   3. Heat intolerance   4. Colonic inertia   5. Pineal gland cyst   6. Medication management     PLAN:  Ms. Jonita Hirota is a very pleasant 28 year old female who has a longstanding history of orthostatic  dizziness but states that over the past 6 months since having an insect bite, her symptoms have worsened.  She has undergone  comprehensive neurologic assessment and an MRI has shown stable incidental pineal cyst and a small right frontal periventricular focus of nonspecific gliosis.  She states that she has become intolerant to heat and is constantly cold.  She is felt to have possible vasovagal syncope versus orthostatic hypotension.  Recent laboratory including CBC, CMP, thyroid function studies laboratory for Lyme disease, [place] spotted fever, ANA and CPK of all been normal.  On exam today she is mildly orthostatic with a systolic blood pressure of 104 which decreases to 94 going from supine to standing position.  She is unaware of any prodrome of significant arrhythmia prior to her events.  Her ECG is normal.  I am initially recommending support stockings should reduce potential orthostatic drop in blood pressure.  I am scheduling her for a 2D echo Doppler study to evaluate for both systolic and diastolic function, valvular architecture, and myocardial abnormalities.  I have also recommended she wear a 2-week Zio patch monitor to make certain there is no arrhythmia.  In the future it may be worthwhile to assess an exercise stress test to make certain she does not have any chronotropic response to exercise and consider possible tilt table testing if she continues to experience recurrent symptomatology.  I have recommended repeat laboratory in several months and I will see her back in the office for follow-up evaluation following completion of the above studies.   Medication Adjustments/Labs and Tests Ordered: Current medicines are reviewed at length with the patient today.  Concerns regarding medicines are outlined above.  Medication changes, Labs and Tests ordered today are listed in the Patient Instructions below. Patient Instructions  Labwork: FASTING LIPID,CMP, CBC AND TSH HERE IN OUR OFFICE AT  [facility]    If you have labs (blood work) drawn today and your tests are completely normal, you will receive your results only by: Marland Kitchen MyChart Message (if you have MyChart) OR . A paper copy in the mail If you have any lab test that is abnormal or we need to change your treatment, we will call you to review the results.  Testing/Procedures: Echocardiogram - Your physician has requested that you have an echocardiogram. Echocardiography is a painless test that uses sound waves to create images of your heart. It provides your doctor with information about the size and shape of your heart and how well your heart's chambers and valves are working. This procedure takes approximately one hour. There are no restrictions for this procedure. This will be performed at our [facility] location - [address].  Your physician has recommended that you wear a 14 DAY ZIO-PATCH monitor. The Zio patch cardiac monitor continuously records heart rhythm data for up to 14 days, this is for patients being evaluated for multiple types heart rhythms. For the first 24 hours post application, please avoid getting the Zio monitor wet in the shower or by excessive sweating during exercise. After that, feel free to carry on with regular activities. Keep soaps and lotions away from the ZIO XT Patch.  This will be mailed to you, please expect 7-10 days to receive.     Special Instructions: PLEASE PURCHASE AND WEAR COMPRESSION STOCKINGS DAILY AND OFF AT BEDTIME. Compression stockings are elastic socks that squeeze the legs. They help to increase blood flow to the legs and to decrease swelling in the legs from fluid retention, and reduce the chance of developing blood clots in the lower legs.   Follow-Up: IN 2 MONTHS.  In Person  [name], MD-AFTER MONITOR AND ECHO.    At [facility], you and your health needs are our priority.  As part of our continuing mission to provide you with exceptional heart care, we have  created designated Provider Care Teams.  These Care Teams include your primary Cardiologist (physician) and Advanced Practice Providers (APPs -  Physician Assistants and Nurse Practitioners) who all work together to provide you with the care you need, when you need it.  Thank you for choosing [facility] at [facility]!!          Signed, [name], MD  11/18/2019 11:50 AM    [facility] [address], [place], [place], [place]  [address] Phone: [phone]

## 2019-11-14 ENCOUNTER — Telehealth: Payer: Self-pay

## 2019-11-14 NOTE — Telephone Encounter (Signed)
14 day ZIO ordered and mailed to pt.  

## 2019-11-18 ENCOUNTER — Encounter: Payer: Self-pay | Admitting: Cardiovascular Disease

## 2019-11-19 DIAGNOSIS — D485 Neoplasm of uncertain behavior of skin: Secondary | ICD-10-CM | POA: Diagnosis not present

## 2019-11-19 DIAGNOSIS — D239 Other benign neoplasm of skin, unspecified: Secondary | ICD-10-CM

## 2019-11-19 DIAGNOSIS — D2339 Other benign neoplasm of skin of other parts of face: Secondary | ICD-10-CM | POA: Diagnosis not present

## 2019-11-19 DIAGNOSIS — L309 Dermatitis, unspecified: Secondary | ICD-10-CM | POA: Diagnosis not present

## 2019-11-19 DIAGNOSIS — D2262 Melanocytic nevi of left upper limb, including shoulder: Secondary | ICD-10-CM | POA: Diagnosis not present

## 2019-11-19 DIAGNOSIS — D225 Melanocytic nevi of trunk: Secondary | ICD-10-CM | POA: Diagnosis not present

## 2019-11-19 DIAGNOSIS — D2230 Melanocytic nevi of unspecified part of face: Secondary | ICD-10-CM | POA: Diagnosis not present

## 2019-11-19 DIAGNOSIS — D229 Melanocytic nevi, unspecified: Secondary | ICD-10-CM | POA: Diagnosis not present

## 2019-11-19 HISTORY — DX: Other benign neoplasm of skin, unspecified: D23.9

## 2019-11-25 ENCOUNTER — Ambulatory Visit: Payer: BC Managed Care – PPO | Admitting: Family Medicine

## 2019-11-25 ENCOUNTER — Other Ambulatory Visit: Payer: Self-pay

## 2019-11-25 ENCOUNTER — Encounter: Payer: Self-pay | Admitting: Family Medicine

## 2019-11-25 VITALS — BP 108/76 | HR 113 | Temp 98.4°F | Resp 14 | Ht 67.0 in | Wt 145.0 lb

## 2019-11-25 DIAGNOSIS — N898 Other specified noninflammatory disorders of vagina: Secondary | ICD-10-CM | POA: Diagnosis not present

## 2019-11-25 NOTE — Patient Instructions (Signed)
#   discharge - lab should be back in about 24 hours - I will send results to [place] over the information for the HPV shot

## 2019-11-25 NOTE — Progress Notes (Signed)
   Subjective:     Caroline [name] is a 28 y.o. female presenting for Vaginal Discharge (some burning, some odor. x 3 days.)     HPI   #Vaginal discharge - burning - some odor - x 3 days - not sexually active - discharge: clear discharge - has not needed panty liners - occasionally wet - had hx of yeast infection as kid - notices that if she eats colored foods it changes her urine/feces - will start her period next week   Review of Systems  Gastrointestinal: Negative for abdominal pain.  Genitourinary: Negative for difficulty urinating, dysuria, frequency and pelvic pain.     Social History   Tobacco Use  Smoking Status Never Smoker  Smokeless Tobacco Never Used        Objective:    BP Readings from Last 3 Encounters:  11/25/19 108/76  11/11/19 117/83  09/17/19 100/62   Wt Readings from Last 3 Encounters:  11/25/19 145 lb (65.8 kg)  11/11/19 143 lb 3.2 oz (65 kg)  09/17/19 139 lb (63 kg)    BP 108/76   Pulse (!) 113   Temp 98.4 F (36.9 C)   Resp 14   Ht 5\' 7"  (1.702 m)   Wt 145 lb (65.8 kg)   LMP 11/07/2019   SpO2 99%   BMI 22.71 kg/m    Physical Exam Exam conducted with a chaperone present.  Constitutional:      General: She is not in acute distress.    Appearance: She is well-developed. She is not diaphoretic.  HENT:     Right Ear: External ear normal.     Left Ear: External ear normal.     Nose: Nose normal.  Eyes:     Conjunctiva/sclera: Conjunctivae normal.  Neck:     Musculoskeletal: Neck supple.  Cardiovascular:     Rate and Rhythm: Normal rate.  Pulmonary:     Effort: Pulmonary effort is normal.  Genitourinary:    Comments: Vulva with mild erythema and white discharge present.  Skin:    General: Skin is warm and dry.     Capillary Refill: Capillary refill takes less than 2 seconds.  Neurological:     Mental Status: She is alert. Mental status is at baseline.  Psychiatric:        Mood and Affect: Mood normal.      Behavior: Behavior normal.           Assessment & Plan:   Problem List Items Addressed This Visit    None    Visit Diagnoses    Vaginal discharge    -  Primary   Relevant Orders   WET PREP BY MOLECULAR PROBE     Suspect possible yeast infection. Will f/u wet prep and send appropriate antibiotics   Return if symptoms worsen or fail to improve.  Lesleigh Noe, MD

## 2019-11-26 ENCOUNTER — Other Ambulatory Visit: Payer: Self-pay | Admitting: Family Medicine

## 2019-11-26 ENCOUNTER — Encounter: Payer: Self-pay | Admitting: Family Medicine

## 2019-11-26 DIAGNOSIS — B379 Candidiasis, unspecified: Secondary | ICD-10-CM

## 2019-11-26 LAB — WET PREP BY MOLECULAR PROBE
Candida species: NOT DETECTED
Gardnerella vaginalis: NOT DETECTED
MICRO NUMBER:: 1150925
SPECIMEN QUALITY:: ADEQUATE
Trichomonas vaginosis: NOT DETECTED

## 2019-11-26 MED ORDER — FLUCONAZOLE 150 MG PO TABS: 150.0000 mg | ORAL_TABLET | Freq: Once | ORAL | 0 refills | Status: AC

## 2019-11-26 NOTE — Progress Notes (Signed)
MyChart to patient  Difficulty with sample so will treat for possible yeast infection.

## 2019-12-03 ENCOUNTER — Other Ambulatory Visit: Payer: Self-pay

## 2019-12-03 ENCOUNTER — Ambulatory Visit (HOSPITAL_COMMUNITY): Payer: BC Managed Care – PPO | Attending: Internal Medicine

## 2019-12-03 DIAGNOSIS — R42 Dizziness and giddiness: Secondary | ICD-10-CM | POA: Insufficient documentation

## 2019-12-03 DIAGNOSIS — R55 Syncope and collapse: Secondary | ICD-10-CM | POA: Insufficient documentation

## 2019-12-05 ENCOUNTER — Telehealth: Payer: Self-pay | Admitting: Cardiovascular Disease

## 2019-12-05 ENCOUNTER — Other Ambulatory Visit (INDEPENDENT_AMBULATORY_CARE_PROVIDER_SITE_OTHER): Payer: BC Managed Care – PPO

## 2019-12-05 DIAGNOSIS — R42 Dizziness and giddiness: Secondary | ICD-10-CM | POA: Diagnosis not present

## 2019-12-05 DIAGNOSIS — R55 Syncope and collapse: Secondary | ICD-10-CM

## 2019-12-05 DIAGNOSIS — Z79899 Other long term (current) drug therapy: Secondary | ICD-10-CM | POA: Diagnosis not present

## 2019-12-05 NOTE — Telephone Encounter (Signed)
Patient is calling concerned with wearing her 14 day monitor due to thinking the adhesive will cause a bad reaction to her skin and would prefer not to wear it if possible. Please advise.

## 2019-12-05 NOTE — Telephone Encounter (Signed)
Returned patients call. We discussed the pros/cons of wearing the monitor. She is really concerned about having an allergic reaction. I explained the patch is hypoallergenic but I cant promise she will not have a reaction. I explained the monitor will help determine if her dizziness is coming from a rhythm issue. At the end of the call she decided to wear the monitor to get some data but will take it off at the first sign of irritation.

## 2019-12-06 LAB — CBC
Hematocrit: 36.7 % (ref 34.0–46.6)
Hemoglobin: 12.2 g/dL (ref 11.1–15.9)
MCH: 28.4 pg (ref 26.6–33.0)
MCHC: 33.2 g/dL (ref 31.5–35.7)
MCV: 85 fL (ref 79–97)
Platelets: 237 10*3/uL (ref 150–450)
RBC: 4.3 x10E6/uL (ref 3.77–5.28)
RDW: 12.7 % (ref 11.7–15.4)
WBC: 6.1 10*3/uL (ref 3.4–10.8)

## 2019-12-06 LAB — COMPREHENSIVE METABOLIC PANEL
ALT: 4 IU/L (ref 0–32)
AST: 15 IU/L (ref 0–40)
Albumin/Globulin Ratio: 2 (ref 1.2–2.2)
Albumin: 4.4 g/dL (ref 3.9–5.0)
Alkaline Phosphatase: 71 IU/L (ref 39–117)
BUN/Creatinine Ratio: 11 (ref 9–23)
BUN: 8 mg/dL (ref 6–20)
Bilirubin Total: 0.4 mg/dL (ref 0.0–1.2)
CO2: 23 mmol/L (ref 20–29)
Calcium: 9.4 mg/dL (ref 8.7–10.2)
Chloride: 105 mmol/L (ref 96–106)
Creatinine, Ser: 0.72 mg/dL (ref 0.57–1.00)
GFR calc Af Amer: 132 mL/min/{1.73_m2} (ref >59)
GFR calc non Af Amer: 114 mL/min/{1.73_m2} (ref >59)
Globulin, Total: 2.2 g/dL (ref 1.5–4.5)
Glucose: 89 mg/dL (ref 65–99)
Potassium: 4 mmol/L (ref 3.5–5.2)
Sodium: 141 mmol/L (ref 134–144)
Total Protein: 6.6 g/dL (ref 6.0–8.5)

## 2019-12-06 LAB — LIPID PANEL
Chol/HDL Ratio: 2.2 ratio (ref 0.0–4.4)
Cholesterol, Total: 154 mg/dL (ref 100–199)
HDL: 71 mg/dL (ref >39)
LDL Chol Calc (NIH): 70 mg/dL (ref 0–99)
Triglycerides: 67 mg/dL (ref 0–149)
VLDL Cholesterol Cal: 13 mg/dL (ref 5–40)

## 2019-12-06 LAB — TSH: TSH: 1.82 u[IU]/mL (ref 0.450–4.500)

## 2020-01-01 DIAGNOSIS — L700 Acne vulgaris: Secondary | ICD-10-CM | POA: Diagnosis not present

## 2020-01-01 DIAGNOSIS — L858 Other specified epidermal thickening: Secondary | ICD-10-CM | POA: Diagnosis not present

## 2020-01-01 DIAGNOSIS — L219 Seborrheic dermatitis, unspecified: Secondary | ICD-10-CM | POA: Diagnosis not present

## 2020-01-01 DIAGNOSIS — L309 Dermatitis, unspecified: Secondary | ICD-10-CM | POA: Diagnosis not present

## 2020-01-05 DIAGNOSIS — R55 Syncope and collapse: Secondary | ICD-10-CM | POA: Diagnosis not present

## 2020-01-12 ENCOUNTER — Encounter: Payer: Self-pay | Admitting: Cardiovascular Disease

## 2020-01-12 ENCOUNTER — Ambulatory Visit (INDEPENDENT_AMBULATORY_CARE_PROVIDER_SITE_OTHER): Payer: BC Managed Care – PPO | Admitting: Cardiovascular Disease

## 2020-01-12 ENCOUNTER — Other Ambulatory Visit: Payer: Self-pay

## 2020-01-12 VITALS — BP 109/75 | HR 93 | Temp 97.9°F | Ht 67.0 in | Wt 148.0 lb

## 2020-01-12 DIAGNOSIS — K599 Functional intestinal disorder, unspecified: Secondary | ICD-10-CM

## 2020-01-12 DIAGNOSIS — R42 Dizziness and giddiness: Secondary | ICD-10-CM | POA: Diagnosis not present

## 2020-01-12 DIAGNOSIS — I472 Ventricular tachycardia: Secondary | ICD-10-CM

## 2020-01-12 DIAGNOSIS — I4729 Other ventricular tachycardia: Secondary | ICD-10-CM

## 2020-01-12 DIAGNOSIS — R55 Syncope and collapse: Secondary | ICD-10-CM

## 2020-01-12 NOTE — Progress Notes (Signed)
Cardiology Office Note    Date:  01/14/2020   ID:  Caroline [name], DOB [birth year], MRN [id number]  PCP:  [name], MD  Cardiologist:  [name], MD   F/U cardiology consultation referred by [name] for evaluation of episodic dizziness.  History of Present Illness:  Caroline [name] is a 29 y.o. female was followed by Dr. [name].  Over the past 6 months, she has had several episodes where she gets dizzy with difficulty with balance and gait, heat intolerance and fatigability.  In June, she states that she was bitten by some type of bug which ultimately resulted in significant rash and required several weeks of antibiotic therapy.  Subsequently, she helped significant heat intolerance she admits to being constantly cold.  Thyroid function studies have been normal.  Due to ongoing recurrent symptomatology she ultimately was referred for neurologic evaluation and saw [name].  Patient has a remote history of seizures with her last in 2007 described as twitching starting in her right foot and then feeling a sensation that her body is locking up.  During [name] evaluation he stated that the patient had multiple neurologic complaints including dizziness with out prior neurologic etiology and had been seen by multiple doctors.  An MRI had shown stable incidental pineal cyst and area of stable incidental T12 hyperintensity which was a small 2 mm right frontal periventricular focus of nonspecific gliosis.  After her most recent evaluation with [name], the feeling was most likely her dizziness was related to possible vasovagal versus orthostatic hypotension.  Her neurologic exam was normal.  She had a normal CBC, B12, Lyme, [place] spotted fever, CK, ANA, CMP, and TSH levels.    Patient states that when she gets dizziness she at times has a sensation of tunnel vision.  She is unaware of any significant arrhythmia prior to her events.  She is now referred for  cardiology evaluation.  Past Medical History:  Diagnosis Date  . Colonic inertia 2007   Dr [name] ([facility])  . History of chicken pox   . Influenza B 11/19/2018  . Seasonal allergic rhinitis     Past Surgical History:  Procedure Laterality Date  . COLONOSCOPY  2010   WNL [name])  . EXCISION MORTON'S NEUROMA Left 2007  . WISDOM TOOTH EXTRACTION  2010    Current Medications: Outpatient Medications Prior to Visit  Medication Sig Dispense Refill  . ACZONE 7.5 % GEL Apply a small amount to affected area every morning    . Adapalene 0.3 % gel Apply a pea sized amount to the entire face every other night gradually increasing to QHS.    Marland Kitchen cetirizine (ZYRTEC) 10 MG tablet Take 10 mg by mouth daily as needed for allergies.    . Cholecalciferol (VITAMIN D) 2000 units CAPS Take 1 capsule (2,000 Units total) by mouth daily. 30 capsule   . fluconazole (DIFLUCAN) 150 MG tablet Take 150 mg by mouth once.    Marland Kitchen ketoconazole (NIZORAL) 2 % shampoo Shampoo into scalp let sit 5-10 mins before washing out    . Multiple Vitamins-Calcium (ONE-A-DAY WOMENS FORMULA PO) Take 1 tablet by mouth daily.    . polyethylene glycol (MIRALAX / GLYCOLAX) packet Take 17 g by mouth daily. As needed    . triamcinolone (NASACORT) 55 MCG/ACT AERO nasal inhaler Place 2 sprays into the nose daily. 1 Inhaler   . triamcinolone lotion (KENALOG) 0.1 %     . vitamin B-12 (  CYANOCOBALAMIN) 1000 MCG tablet Take 1 tablet (1,000 mcg total) by mouth daily.     No facility-administered medications prior to visit.     Allergies:   Doxycycline and Linzess [linaclotide]   Social History   Socioeconomic History  . Marital status: Single    Spouse name: Not on file  . Number of children: 0  . Years of education: 65  . Highest education level: Bachelor's degree (e.g., BA, AB, BS)  Occupational History  . Not on file  Tobacco Use  . Smoking status: Never Smoker  . Smokeless tobacco: Never Used  Substance and Sexual  Activity  . Alcohol use: Yes    Comment: occasionally  . Drug use: No  . Sexual activity: Not on file  Other Topics Concern  . Not on file  Social History Narrative   Caffeine use: 1 cup coffee/day and some tea   Lives at home with mother, father, and sister and pets (horses, ducks, Sales promotion account executive, fish, wild [place])   Occ: Neurosurgeon, also student   Edu: bachelor's biochem [employer] and studying Health visitor   Activity: no regular exercise   Diet: good water, fruits/vegetables daily   Social Determinants of Health   Financial Resource Strain:   . Difficulty of Paying Living Expenses: Not on file  Food Insecurity:   . Worried About Charity fundraiser in the Last Year: Not on file  . Ran Out of Food in the Last Year: Not on file  Transportation Needs:   . Lack of Transportation (Medical): Not on file  . Lack of Transportation (Non-Medical): Not on file  Physical Activity:   . Days of Exercise per Week: Not on file  . Minutes of Exercise per Session: Not on file  Stress:   . Feeling of Stress : Not on file  Social Connections:   . Frequency of Communication with Friends and Family: Not on file  . Frequency of Social Gatherings with Friends and Family: Not on file  . Attends Religious Services: Not on file  . Active Member of Clubs or Organizations: Not on file  . Attends Archivist Meetings: Not on file  . Marital Status: Not on file   Social history is notable in that she is single and lives at home.  She received a bachelor's degree at [employer] in biochemistry.  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  There is no history of tobacco use.  She does not exercise.  Family History:  The patient's family history includes Cancer in her paternal grandmother; Dementia in her maternal grandmother; Heart failure in her maternal grandmother; Hypertension in her father, maternal grandmother, and paternal grandmother;  Hypothyroidism in her mother; Stroke in her maternal grandmother.   Her mother is living at age 27 but has had a history of stroke, father is living at age 49 and has mild CHF, COPD, atrial fibrillation and CAD.  She has 1 sister.  ROS General: Negative; No fevers, chills, or night sweats;  HEENT: Negative; No changes in vision or hearing, sinus congestion, difficulty swallowing Pulmonary: Negative; No cough, wheezing, shortness of breath, hemoptysis Cardiovascular: Negative; No chest pain, presyncope, syncope, palpitations GI: History of "colonic inertia." GU: Negative; No dysuria, hematuria, or difficulty voiding Musculoskeletal: Negative; no myalgias, joint pain, or weakness Hematologic/Oncology: Negative; no easy bruising, bleeding Endocrine: Negative; no heat/cold intolerance; no diabetes Neuro: dizziness with tunnel vision Skin: Recent bug bite with significant rash and streaking  of her arms in June 2020 Psychiatric: Negative; No behavioral problems, depression Sleep: Negative; No snoring, daytime sleepiness, hypersomnolence, bruxism, restless legs, hypnogognic hallucinations, no cataplexy Other comprehensive 14 point system review is negative.   PHYSICAL EXAM:   VS:  BP 109/75   Pulse 93   Temp 97.9 F (36.6 C)   Ht _0  (1.702 m)   Wt 148 lb (67.1 kg)   SpO2 100%   BMI 23.18 kg/m     Blood pressure by me was 104/68 supine and 94/68 standing  Wt Readings from Last 3 Encounters:  01/12/20 148 lb (67.1 kg)  11/25/19 145 lb (65.8 kg)  11/11/19 143 lb 3.2 oz (65 kg)    General: Alert, oriented, no distress.  Skin: normal turgor, no rashes, warm and dry HEENT: Normocephalic, atraumatic. Pupils equal round and reactive to light; sclera anicteric; extraocular muscles intact;  Nose without nasal septal hypertrophy Mouth/Parynx benign; Mallinpatti scale 2 Neck: No JVD, no carotid bruits; normal carotid upstroke Lungs: clear to ausculatation and percussion; no wheezing or  rales Chest wall: without tenderness to palpitation Heart: PMI not displaced, RRR, s1 s2 normal, 1/6 systolic murmur, no diastolic murmur, no rubs, gallops, thrills, or heaves Abdomen: soft, nontender; no hepatosplenomehaly, BS+; abdominal aorta nontender and not dilated by palpation. Back: no CVA tenderness Pulses 2+ Musculoskeletal: full range of motion, normal strength, no joint deformities Extremities: no clubbing cyanosis or edema, Homan's sign negative  Neurologic: grossly nonfocal; Cranial nerves grossly wnl Psychologic: Normal mood and affect   Studies/Labs Reviewed:   EKG:  EKG is ordered today.  ECG (independently read by me): Normal sinus rhythm with mild sinus arrhythmia at 85 bpm.  PR interval 114 ms, QTc interval 445 ms  November 11, 2019 ECG (independently read by me): Normal sinus rhythm at 87 bpm.  No ectopy.  No significant ST-T changes.  Normal intervals with a PR segment at 116 ms and a QTC interval at 454 ms; no ectopy.  Recent Labs: BMP Latest Ref Rng & Units 12/05/2019 06/23/2019 07/16/2018  Glucose 65 - 99 mg/dL 89 87 85  BUN 6 - 20 mg/dL _1 Creatinine 0.57 - 1.00 mg/dL 0.72 0.78 0.72  BUN/Creat Ratio 9 - _2 Sodium 134 - 144 mmol/L 141 142 143  Potassium 3.5 - 5.2 mmol/L 4.0 4.2 4.1  Chloride 96 - 106 mmol/L 105 104 106  CO2 20 - 29 mmol/L _3 Calcium 8.7 - 10.2 mg/dL 9.4 9.5 9.5     Hepatic Function Latest Ref Rng & Units 12/05/2019 06/23/2019 12/08/2015  Total Protein 6.0 - 8.5 g/dL 6.6 6.7 6.6  Albumin 3.9 - 5.0 g/dL 4.4 4.7 4.6  AST 0 - 40 IU/L _4 ALT 0 - 32 IU/L _5 Alk Phosphatase 39 - 117 IU/L 71 59 57  Total Bilirubin 0.0 - 1.2 mg/dL 0.4 0.3 0.5    CBC Latest Ref Rng & Units 12/05/2019 08/06/2019 06/23/2019  WBC 3.4 - 10.8 x10E3/uL 6.1 6.9 7.7  Hemoglobin 11.1 - 15.9 g/dL 12.2 13.0 13.2  Hematocrit 34.0 - 46.6 % 36.7 39.1 40.1  Platelets 150 - 450 x10E3/uL 237 251 254   Lab Results  Component Value Date   MCV 85  12/05/2019   MCV 86 08/06/2019   MCV 85 06/23/2019   Lab Results  Component Value Date   TSH 1.820 12/05/2019   Lab Results  Component Value Date   HGBA1C 5.5  12/08/2015     BNP No results found for: BNP  ProBNP No results found for: PROBNP   Lipid Panel     Component Value Date/Time   CHOL 154 12/05/2019 1118   TRIG 67 12/05/2019 1118   HDL 71 12/05/2019 1118   CHOLHDL 2.2 12/05/2019 1118   LDLCALC 70 12/05/2019 1118   LABVLDL 13 12/05/2019 1118     RADIOLOGY: ZIO PATCH  Result Date: 01/14/2020 The patient was monitored with a Zio patch for 14 days from December 05, 2019 through December 19, 2019.  The predominant rhythm was sinus rhythm with an average heart rate at 86 bpm.  The slowest heart rate was sinus bradycardia which occurred at 4:52 AM while she was sleeping on December 23.  The fastest heart rate was sinus tachycardia at 173 bpm when the patient was at work at 9:27 AM on December 18.  The patient had 1 run of nonsustained ventricular tachycardia lasting 6 beats with an average rate at 113 bpm and maximum 135.  There were very rare isolated PACs less than 1% and very rare atrial couplets less than 1%.  There were very rare isolated PVCs and couplets less than 1%.  There were no pauses.  There was no evidence for atrial fibrillation.    Additional studies/ records that were reviewed today include:  I reviewed the records of Dr. [name] as well as Dr. [name]  ECHO 12/03/2019 IMPRESSIONS  1. Left ventricular ejection fraction, by visual estimation, is 60 to 65%. The left ventricle has normal function. There is no left ventricular hypertrophy.  2. The left ventricle has no regional wall motion abnormalities.  3. Global right ventricle has normal systolic function.The right ventricular size is normal. No increase in right ventricular wall thickness.  4. Left atrial size was normal.  5. Right atrial size was normal.  6. The mitral valve is normal in structure.  Trivial mitral valve regurgitation. No evidence of mitral stenosis.  7. The tricuspid valve is normal in structure. Tricuspid valve regurgitation is trivial.  8. The aortic valve is tricuspid. Aortic valve regurgitation is not visualized. No evidence of aortic valve sclerosis or stenosis.  9. The pulmonic valve was normal in structure. Pulmonic valve regurgitation is trivial. 10. Normal pulmonary artery systolic pressure. 11. The inferior vena cava is normal in size with greater than 50% respiratory variability, suggesting right atrial pressure of 3 mmHg.   ZIO patch 14 day monitorStudy Highlights  The patient was monitored with a Zio patch for 14 days from December 05, 2019 through December 19, 2019.  The predominant rhythm was sinus rhythm with an average heart rate at 86 bpm.  The slowest heart rate was sinus bradycardia which occurred at 4:52 AM while she was sleeping on December 23.  The fastest heart rate was sinus tachycardia at 173 bpm when the patient was at work at 9:27 AM on December 18.  The patient had 1 run of nonsustained ventricular tachycardia lasting 6 beats with an average rate at 113 bpm and maximum 135.  There were very rare isolated PACs less than 1% and very rare atrial couplets less than 1%.  There were very rare isolated PVCs and couplets less than 1%.  There were no pauses.  There was no evidence for atrial fibrillation.     ASSESSMENT:    1. Orthostatic dizziness   2. NSVT (nonsustained ventricular tachycardia) ([place]): 6 beats, average 113 bpm   3. Colonic inertia   4. Vasovagal near-syncope  PLAN:  [name] is a very pleasant 29 year-old female who has a longstanding history of orthostatic dizziness but states that since having an insect bite, her symptoms have worsened.  She has undergone comprehensive neurologic assessment and an MRI has shown stable incidental pineal cyst and a small right frontal periventricular focus of nonspecific gliosis.  She  states that she has become intolerant to heat and is constantly cold.  She is felt to have possible vasovagal syncope versus orthostatic hypotension.  Recent laboratory including CBC, CMP, thyroid function studies laboratory for Lyme disease, [place] spotted fever, ANA and CPK of all been normal.  On her initial evaluation with me in November 2020 her blood pressure was low with minimal orthostasis and supine blood pressure dropping from 104 to 94 going from supine to standing position.  At that time I recommended support stockings.  Her echo Doppler study has demonstrated normal systolic and diastolic function.  She has normal wall motion and normal valvular architecture.  There is no evidence for pulmonary hypertension.  She had worn a 14-day cardiac monitor which showed predominant sinus rhythm with an average rate at 86 bpm.  Her slowest heart rate occurred while sleeping and was sinus bradycardia at 51.  Her fastest heart rate was sinus tachycardia up to 173 bpm which occurred while she was at work.  There was one episode of nonsustained ventricular tachycardia lasting 6 beats at an average rate of 135 bpm for which she was completely asymptomatic.  Recently, she does admit to some issues with her equilibrium.  She also has issues with "colonic inertia" "and at times has gone almost 18 days without a bowel movement.  She has seen several GI physicians including Dr. Carlis Abbott, Dr. [name] and more recently Dr. [name].  Her blood pressure today is stable but continues to be on the low side and on repeat by me was 110/70 supine and 102/68 standing.  I have discussed giving her prescription for metoprolol 25 mg to take on an as-needed basis if she does experience an episode of significant palpitations.  She will be following up with her primary MD and [name].  I will see her as needed if continued issues develop.   Medication Adjustments/Labs and Tests Ordered: Current medicines are reviewed at length with  the patient today.  Concerns regarding medicines are outlined above.  Medication changes, Labs and Tests ordered today are listed in the Patient Instructions below. Patient Instructions  Medication Instructions:  Continue current medications  *If you need a refill on your cardiac medications before your next appointment, please call your pharmacy*  Lab Work: None Ordered  Testing/Procedures: None Ordered  Follow-Up: At [facility], you and your health needs are our priority.  As part of our continuing mission to provide you with exceptional heart care, we have created designated Provider Care Teams.  These Care Teams include your primary Cardiologist (physician) and Advanced Practice Providers (APPs -  Physician Assistants and Nurse Practitioners) who all work together to provide you with the care you need, when you need it.  Your next appointment:   As Needed       Signed, [name], MD  01/14/2020 7:41 PM    [facility] [address], [place], [place], [place]  [address] Phone: [phone]

## 2020-01-12 NOTE — Patient Instructions (Signed)
Medication Instructions:  Continue current medications  *If you need a refill on your cardiac medications before your next appointment, please call your pharmacy*  Lab Work: None Ordered  Testing/Procedures: None Ordered  Follow-Up: At [facility], you and your health needs are our priority.  As part of our continuing mission to provide you with exceptional heart care, we have created designated Provider Care Teams.  These Care Teams include your primary Cardiologist (physician) and Advanced Practice Providers (APPs -  Physician Assistants and Nurse Practitioners) who all work together to provide you with the care you need, when you need it.  Your next appointment:   As Needed

## 2020-01-14 ENCOUNTER — Encounter: Payer: Self-pay | Admitting: Cardiovascular Disease

## 2020-01-15 ENCOUNTER — Ambulatory Visit: Payer: BC Managed Care – PPO | Admitting: Family Medicine

## 2020-01-15 NOTE — Addendum Note (Signed)
Addended by: Vennie Homans on: 01/15/2020 10:09 AM   Modules accepted: Orders

## 2020-01-20 ENCOUNTER — Ambulatory Visit: Payer: BC Managed Care – PPO | Admitting: Family Medicine

## 2020-01-20 ENCOUNTER — Encounter: Payer: Self-pay | Admitting: Family Medicine

## 2020-01-20 ENCOUNTER — Other Ambulatory Visit: Payer: Self-pay

## 2020-01-20 VITALS — BP 118/78 | HR 94 | Temp 97.8°F | Ht 67.0 in | Wt 148.2 lb

## 2020-01-20 DIAGNOSIS — R42 Dizziness and giddiness: Secondary | ICD-10-CM

## 2020-01-20 DIAGNOSIS — K5909 Other constipation: Secondary | ICD-10-CM

## 2020-01-20 DIAGNOSIS — R9082 White matter disease, unspecified: Secondary | ICD-10-CM

## 2020-01-20 DIAGNOSIS — E559 Vitamin D deficiency, unspecified: Secondary | ICD-10-CM

## 2020-01-20 DIAGNOSIS — K599 Functional intestinal disorder, unspecified: Secondary | ICD-10-CM

## 2020-01-20 DIAGNOSIS — R55 Syncope and collapse: Secondary | ICD-10-CM | POA: Diagnosis not present

## 2020-01-20 NOTE — Patient Instructions (Addendum)
Continue current medicines.  I'm glad everything checked out neurologically.  Will await foot doctor and ENT evaluations.  Continue leg elevation, good hydration status, some salt in diet. Consider tighter socks when prolonged walking planned.

## 2020-01-20 NOTE — Progress Notes (Signed)
This visit was conducted in person.  BP 118/78 (BP Location: Left Arm, Patient Position: Sitting, Cuff Size: Normal)   Pulse 94   Temp 97.8 F (36.6 C) (Temporal)   Ht '5\' 7"'  (1.702 m)   Wt 148 lb 4 oz (67.2 kg)   LMP 01/08/2020   SpO2 99%   BMI 23.22 kg/m    CC: f/u visit Subjective:    Patient ID: Para March, female    DOB: 07-30-1991, 29 y.o.   MRN: [id number]  HPI: Caroline [name] is a 29 y.o. female presenting on 01/20/2020 for Extremity Weakness (F/u on bilateral leg weakness.  Also, c/o heat sensitivity. )   See prior notes and neurology and cardiology notes for details.   Trouble may have started after unknown bug bite to forearm 05/2019 - although she also had similar issues years ago. Notes heat intolerance, fatigue, leg weakness, unsteadiness. Referred to neurology and cardiology. Her neurologic exam was normal.  She had a normal CBC, B12, Lyme, [place] fever, CK, ANA, CMP, and TSH levels. Neurology thought she may have been experiencing vasovagal vs orthostatic hypotension with recommendation to liberalize salt intake and increase fluid intake. Referred to cardiology -> recommended support stockings, had echocardiogram and 2wk zio patch monitor, consideration for exercise stress test vs possible tilt table testing.   Lyme disease, RMSF testing was negative 07/2019 Echo was overall normal.  Zio patch showed few runs of NSVT - prescribed metoprolol 45m to take PRN palpitations.   Ongoing heat intolerance, twitching in feet (not spasms) with puffy feet - uses insoles by podiatry, leg and ankle weakness, equilibrium trouble Planning to see podiatry. Planning to see ENT Dr IFarrel Conners Ongoing orthostatic dizziness.   Recent labwork from cardiology reviewed - all normal (CBC, CMP, FLP, and TSH).  She has been taking daily MVI.   Denies UTI symptoms, fevers, chest pain or tightness, skin rash or hives. Tolerates red meat well.  Chronic constipation - colonic inertia -  persists despite miralax 1.5 capful daily. Higher dose causes diarrhea. No benefit with previous trial of probiotics, linzess.   Declines flu shot.  Stress high at work, but able to enjoy hobbies, leisure time when at home.      Relevant past medical, surgical, family and social history reviewed and updated as indicated. Interim medical history since our last visit reviewed. Allergies and medications reviewed and updated. Outpatient Medications Prior to Visit  Medication Sig Dispense Refill  . ACZONE 7.5 % GEL Apply a small amount to affected area every morning    . cetirizine (ZYRTEC) 10 MG tablet Take 10 mg by mouth daily as needed for allergies.    . Cholecalciferol (VITAMIN D) 2000 units CAPS Take 1 capsule (2,000 Units total) by mouth daily. 30 capsule   . ketoconazole (NIZORAL) 2 % shampoo Shampoo into scalp let sit 5-10 mins before washing out    . Multiple Vitamins-Calcium (ONE-A-DAY WOMENS FORMULA PO) Take 1 tablet by mouth daily.    . polyethylene glycol (MIRALAX / GLYCOLAX) packet Take 17 g by mouth daily. As needed    . triamcinolone (NASACORT) 55 MCG/ACT AERO nasal inhaler Place 2 sprays into the nose daily. 1 Inhaler   . vitamin B-12 (CYANOCOBALAMIN) 1000 MCG tablet Take 1 tablet (1,000 mcg total) by mouth daily.    . Adapalene 0.3 % gel Apply a pea sized amount to the entire face every other night gradually increasing to QHS.    . fluconazole (DIFLUCAN) 150 MG  tablet Take 150 mg by mouth once.    . triamcinolone lotion (KENALOG) 0.1 %      No facility-administered medications prior to visit.     Per HPI unless specifically indicated in ROS section below Review of Systems Objective:    BP 118/78 (BP Location: Left Arm, Patient Position: Sitting, Cuff Size: Normal)   Pulse 94   Temp 97.8 F (36.6 C) (Temporal)   Ht '5\' 7"'  (1.702 m)   Wt 148 lb 4 oz (67.2 kg)   LMP 01/08/2020   SpO2 99%   BMI 23.22 kg/m   Wt Readings from Last 3 Encounters:  01/20/20 148 lb 4 oz  (67.2 kg)  01/12/20 148 lb (67.1 kg)  11/25/19 145 lb (65.8 kg)    Physical Exam Vitals and nursing note reviewed.  Constitutional:      Appearance: Normal appearance. She is not ill-appearing.  HENT:     Head: Normocephalic and atraumatic.  Eyes:     Extraocular Movements: Extraocular movements intact.     Conjunctiva/sclera: Conjunctivae normal.     Pupils: Pupils are equal, round, and reactive to light.  Neck:     Thyroid: No thyroid mass, thyromegaly or thyroid tenderness.  Cardiovascular:     Rate and Rhythm: Normal rate and regular rhythm.     Pulses: Normal pulses.     Heart sounds: Normal heart sounds. No murmur.  Pulmonary:     Effort: Pulmonary effort is normal. No respiratory distress.     Breath sounds: Normal breath sounds. No wheezing, rhonchi or rales.  Musculoskeletal:        General: Normal range of motion.     Right lower leg: No edema.     Left lower leg: No edema.  Skin:    General: Skin is warm and dry.     Findings: No rash.  Neurological:     General: No focal deficit present.     Mental Status: She is alert.     Comments:  CN 2-12 intact FTN intact EOMI No pronator drift. Neg romberg.   Psychiatric:        Mood and Affect: Mood normal.        Behavior: Behavior normal.       Assessment & Plan:  This visit occurred during the SARS-CoV-2 public health emergency.  Safety protocols were in place, including screening questions prior to the visit, additional usage of staff PPE, and extensive cleaning of exam room while observing appropriate contact time as indicated for disinfecting solutions.  Encouraged she return this summer for physical exam.  Await podiatry eval - consider full electrolyte panel and ESR next labwork.  Problem List Items Addressed This Visit    White matter abnormality on MRI of brain    Stable period s/p neurology eval 2020.       Vitamin D deficiency    Mildly low - she is taking MVI and has been taking extra vit D  supplement daily.      Vasovagal near-syncope    Reviewed compression stocking use. Reviewed liberal salt intake.       Lightheaded - Primary    Ongoing symptoms, s/p reassuring cardiology and neurological eval. Will watch symptoms for now, await ENT eval. All labs I was interested in have been checked.       CONSTIPATION, CHRONIC    Due to colonic inertia. This is despite miralax. Has tried and failed probiotics, linzess, several other medications. Discussed possible trial of motegrity. Consider referral  back to GI.       Colonic inertia       No orders of the defined types were placed in this encounter.  No orders of the defined types were placed in this encounter.   Patient Instructions  Continue current medicines.  I'm glad everything checked out neurologically.  Will await foot doctor and ENT evaluations.  Continue leg elevation, good hydration status, some salt in diet. Consider tighter socks when prolonged walking planned.    Follow up plan: Return if symptoms worsen or fail to improve.  [name], MD

## 2020-01-26 NOTE — Assessment & Plan Note (Signed)
Due to colonic inertia. This is despite miralax. Has tried and failed probiotics, linzess, several other medications. Discussed possible trial of motegrity. Consider referral back to GI.

## 2020-01-26 NOTE — Assessment & Plan Note (Signed)
Reviewed compression stocking use. Reviewed liberal salt intake.

## 2020-01-26 NOTE — Assessment & Plan Note (Signed)
Stable period s/p neurology eval 2020.

## 2020-01-26 NOTE — Assessment & Plan Note (Addendum)
Ongoing symptoms, s/p reassuring cardiology and neurological eval. Will watch symptoms for now, await ENT eval. All labs I was interested in have been checked.

## 2020-01-26 NOTE — Assessment & Plan Note (Signed)
Mildly low - she is taking MVI and has been taking extra vit D supplement daily.

## 2020-01-29 DIAGNOSIS — R42 Dizziness and giddiness: Secondary | ICD-10-CM | POA: Diagnosis not present

## 2020-01-29 DIAGNOSIS — R5383 Other fatigue: Secondary | ICD-10-CM | POA: Diagnosis not present

## 2020-01-29 DIAGNOSIS — J301 Allergic rhinitis due to pollen: Secondary | ICD-10-CM | POA: Diagnosis not present

## 2020-02-18 DIAGNOSIS — R5382 Chronic fatigue, unspecified: Secondary | ICD-10-CM | POA: Diagnosis not present

## 2020-02-18 DIAGNOSIS — E559 Vitamin D deficiency, unspecified: Secondary | ICD-10-CM | POA: Diagnosis not present

## 2020-02-18 DIAGNOSIS — R42 Dizziness and giddiness: Secondary | ICD-10-CM | POA: Diagnosis not present

## 2020-02-27 DIAGNOSIS — R5382 Chronic fatigue, unspecified: Secondary | ICD-10-CM | POA: Diagnosis not present

## 2020-02-27 DIAGNOSIS — R42 Dizziness and giddiness: Secondary | ICD-10-CM | POA: Diagnosis not present

## 2020-03-03 DIAGNOSIS — L700 Acne vulgaris: Secondary | ICD-10-CM | POA: Diagnosis not present

## 2020-03-03 DIAGNOSIS — Z86018 Personal history of other benign neoplasm: Secondary | ICD-10-CM | POA: Diagnosis not present

## 2020-03-03 DIAGNOSIS — L853 Xerosis cutis: Secondary | ICD-10-CM | POA: Diagnosis not present

## 2020-03-19 DIAGNOSIS — R42 Dizziness and giddiness: Secondary | ICD-10-CM | POA: Diagnosis not present

## 2020-03-19 DIAGNOSIS — E559 Vitamin D deficiency, unspecified: Secondary | ICD-10-CM | POA: Diagnosis not present

## 2020-04-21 ENCOUNTER — Ambulatory Visit: Payer: BC Managed Care – PPO | Admitting: Dermatology

## 2020-04-28 DIAGNOSIS — R42 Dizziness and giddiness: Secondary | ICD-10-CM | POA: Diagnosis not present

## 2020-04-28 DIAGNOSIS — E559 Vitamin D deficiency, unspecified: Secondary | ICD-10-CM | POA: Diagnosis not present

## 2020-05-07 ENCOUNTER — Ambulatory Visit: Payer: BC Managed Care – PPO | Admitting: Dermatology

## 2020-07-29 ENCOUNTER — Ambulatory Visit: Payer: BC Managed Care – PPO | Admitting: Dermatology

## 2020-07-29 ENCOUNTER — Other Ambulatory Visit: Payer: Self-pay

## 2020-07-29 DIAGNOSIS — L719 Rosacea, unspecified: Secondary | ICD-10-CM | POA: Diagnosis not present

## 2020-07-29 DIAGNOSIS — D229 Melanocytic nevi, unspecified: Secondary | ICD-10-CM

## 2020-07-29 DIAGNOSIS — L700 Acne vulgaris: Secondary | ICD-10-CM | POA: Diagnosis not present

## 2020-07-29 NOTE — Progress Notes (Signed)
   Follow-Up Visit   Subjective  Caroline [name] is a 29 y.o. female who presents for the following: Acne.  Patient presents today for follow up on 03/03/20 for Acne, using Aczone, CLN acne wash and vanicream. Seems to be working well, only had 1 small break out. Clindamycin sol make her break out more so she stopped using.  The following portions of the chart were reviewed this encounter and updated as appropriate:  Tobacco  Allergies  Meds  Problems  Med Hx  Surg Hx  Fam Hx      Review of Systems:  No other skin or systemic complaints except as noted in HPI or Assessment and Plan.  Objective  Well appearing patient in no apparent distress; mood and affect are within normal limits.  A focused examination was performed including face, neck, chest and back and arms. Relevant physical exam findings are noted in the Assessment and Plan.  Objective  Face: Mid face erythema   Objective  Left Malar Cheek: Trace open comedones at face, otherwise clear   Assessment & Plan  Rosacea Face  Chronic.  Recommend daily broad spectrum sunscreen SPF 30+ to sun-exposed areas, reapply every 2 hours as needed. Call for new or changing lesions.  Discussed Rhofade and BBL light treatment.  No ocular symptoms.  Sample of Rhofade given to patient to try. Lot #[id number]   Exp: Aug 21  Acne vulgaris Left Malar Cheek  Chronic, doing well but has had irritation with retinoids.  Continue aczone daily in the am.  Start Aklief at bedtime. Samples x 3 given.  Lot #[id number]  Exp:09/22  Patient's sister has tretinoin 0.025%. She will try that and let us know if she would like a rx sent in for that or for Aklief.  Topical retinoid medications like Aklief, tretinoin/Retin-A, adapalene/Differin, tazarotene/Fabior, and Epiduo/Epiduo Forte can cause dryness and irritation when first started. Only apply a pea-sized amount to the entire affected area. Avoid applying it around the eyes, edges of mouth and  creases at the nose. If you experience irritation, use a good moisturizer first and/or apply the medicine less often. If you are doing well with the medicine, you can increase how often you use it until you are applying every night. Be careful with sun protection while using this medication as it can make you sensitive to the sun. This medicine should not be used by pregnant women.   Melanocytic Nevi - Tan-brown and/or pink-flesh-colored symmetric macules and papules - Benign appearing on exam today - Observation - Call clinic for new or changing moles - Recommend daily use of broad spectrum spf 30+ sunscreen to sun-exposed areas.   Return in 3 months (on 10/29/2020) for Acne, TBSE.  I, Donzetta Kohut, CMA, am acting as scribe for Forest Gleason, MD .  Documentation: I have reviewed the above documentation for accuracy and completeness, and I agree with the above.  Forest Gleason, MD

## 2020-07-29 NOTE — Patient Instructions (Addendum)
Gentle Skin Care Guide  1. Bathe no more than once a day.  2. Avoid bathing in hot water  3. Use a mild soap like Dove, Vanicream, Cetaphil, CeraVe. Can use Lever 2000 or Cetaphil antibacterial soap  4. Use soap only where you need it. On most days, use it under your arms, between your legs, and on your feet. Let the water rinse other areas unless visibly dirty.  5. When you get out of the bath/shower, use a towel to gently blot your skin dry, don't rub it.  6. While your skin is still a little damp, apply a moisturizing cream such as Vanicream, CeraVe, Cetaphil, Eucerin, Sarna lotion or plain Vaseline Jelly. For hands apply Neutrogena [place] Hand Cream or Excipial Hand Cream.  7. Reapply moisturizer any time you start to itch or feel dry.  8. Sometimes using free and clear laundry detergents can be helpful. Fabric softener sheets should be avoided. Downy Free & Gentle liquid, or any liquid fabric softener that is free of dyes and perfumes, it acceptable to use  9. If your doctor has given you prescription creams you may apply moisturizers over them    Recommend daily broad spectrum sunscreen SPF 30+ to sun-exposed areas, reapply every 2 hours as needed. Call for new or changing lesions.  Topical retinoid medications like Aklief, tretinoin/Retin-A, adapalene/Differin, tazarotene/Fabior, and Epiduo/Epiduo Forte can cause dryness and irritation when first started. Only apply a pea-sized amount to the entire affected area. Avoid applying it around the eyes, edges of mouth and creases at the nose. If you experience irritation, use a good moisturizer first and/or apply the medicine less often. If you are doing well with the medicine, you can increase how often you use it until you are applying every night. Be careful with sun protection while using this medication as it can make you sensitive to the sun. This medicine should not be used by pregnant women.

## 2020-08-15 ENCOUNTER — Encounter: Payer: Self-pay | Admitting: Dermatology

## 2020-09-24 ENCOUNTER — Ambulatory Visit (INDEPENDENT_AMBULATORY_CARE_PROVIDER_SITE_OTHER): Payer: BC Managed Care – PPO | Admitting: Family Medicine

## 2020-09-24 ENCOUNTER — Encounter: Payer: Self-pay | Admitting: Family Medicine

## 2020-09-24 ENCOUNTER — Other Ambulatory Visit: Payer: Self-pay

## 2020-09-24 VITALS — BP 116/72 | HR 108 | Temp 97.9°F | Ht 67.0 in | Wt 148.4 lb

## 2020-09-24 DIAGNOSIS — K5909 Other constipation: Secondary | ICD-10-CM | POA: Diagnosis not present

## 2020-09-24 DIAGNOSIS — E559 Vitamin D deficiency, unspecified: Secondary | ICD-10-CM | POA: Diagnosis not present

## 2020-09-24 DIAGNOSIS — R42 Dizziness and giddiness: Secondary | ICD-10-CM

## 2020-09-24 DIAGNOSIS — R202 Paresthesia of skin: Secondary | ICD-10-CM

## 2020-09-24 MED ORDER — MELATONIN 5 MG PO TABS: 5.0000 mg | ORAL_TABLET | Freq: Every day | ORAL | Status: DC

## 2020-09-24 NOTE — Progress Notes (Signed)
This visit was conducted in person.  BP 116/72 (BP Location: Left Arm, Patient Position: Sitting, Cuff Size: Normal)   Pulse (!) 108   Temp 97.9 F (36.6 C) (Temporal)   Ht 5\' 7"  (1.702 m)   Wt 148 lb 7 oz (67.3 kg)   LMP 09/10/2020   SpO2 99%   BMI 23.25 kg/m    Pulse Readings from Last 3 Encounters:  09/24/20 (!) 108  01/20/20 94  01/12/20 93   CC: insomnia, lightheadedness Subjective:    Patient ID: Caroline [name], female    DOB: 04-20-1991, 29 y.o.   MRN: [id number]  HPI: Caroline [name] is a 29 y.o. female presenting on 09/24/2020 for Insomnia (C/o not sleeping well.  Also, c/o still having gait issues, feeling of pre-syncope, fatigue, soreness, feels "heavy", sinus issues and feeling of overheating. )   See prior notes for details.  Longstanding h/o lightheadedness, bowel irregularities (h/o colonic inertia - takes miralax 1.5 capful daily). She thinks troubles deteriorated after unknown bug bite to forearm 05/2019 - heat intolerance, ongoing fatigue, unsteadiness, leg weakness, body aches, sinus congestion (?fall allergy related).   She had noted progressive improvement, able to do long walks >1 mile and tolerate well. However overheating has continued occurring as well as easy fatigueability with prolonged standing.   Now over the last 6-7 wks noticing increasing trouble again - ?insomnia - wakes up 30 min after initially falling asleep with pounding heart (but HR 70s) trouble falling back asleep. This has happened several times. Increasing leg weakness, muscle twitches at toes, heavy feeling (seeing chiropractor for this).   She does have a bedtime routine.  Feels the best during week of period. Wonders about hormone imbalance.  Regular monthly cycles - LMP 09/08/2020. Off OCP for ~10 yrs.  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   Takes 1 a day women's gummy vitamin. Notes 1 hr later has to lay down due to fatigue. She switched to  tablet form - same issue.  Remotely tried valium for GI issues - didn't like how she felt.   Has seen rheum Meda Coffee), cardiology Claiborne Billings), neurology [name]), ENT Kathyrn Sheriff).  Normal ANA, RF, CPK, neg RMSF and Lyme serology. Normal EKG, echocardiogram and MRI brain, latest 2020. Normal heart monitor.  Seeing derm for roseacea/acne.   Latest rheumatology eval Dr Meda Coffee for ongoing dizziness of unknown cause - vitamin D levels replaced without significant improvement (prior to replacement level was 27).      Relevant past medical, surgical, family and social history reviewed and updated as indicated. Interim medical history since our last visit reviewed. Allergies and medications reviewed and updated. Outpatient Medications Prior to Visit  Medication Sig Dispense Refill  . cetirizine (ZYRTEC) 10 MG tablet Take 10 mg by mouth daily as needed for allergies.    . polyethylene glycol (MIRALAX / GLYCOLAX) packet Take 17 g by mouth daily. As needed    . triamcinolone (NASACORT) 55 MCG/ACT AERO nasal inhaler Place 2 sprays into the nose daily. 1 Inhaler   . ACZONE 7.5 % GEL Apply a small amount to affected area every morning (Patient not taking: Reported on 09/24/2020)    . Multiple Vitamins-Calcium (ONE-A-DAY WOMENS FORMULA PO) Take 1 tablet by mouth daily. (Patient not taking: Reported on 09/24/2020)    . Cholecalciferol (VITAMIN D) 2000 units CAPS Take 1 capsule (2,000 Units total) by mouth daily. (Patient not taking: Reported on 09/24/2020) 30 capsule   . ketoconazole (  NIZORAL) 2 % shampoo Shampoo into scalp let sit 5-10 mins before washing out    . vitamin B-12 (CYANOCOBALAMIN) 1000 MCG tablet Take 1 tablet (1,000 mcg total) by mouth daily. (Patient not taking: Reported on 09/24/2020)     No facility-administered medications prior to visit.     Per HPI unless specifically indicated in ROS section below Review of Systems Objective:  BP 116/72 (BP Location: Left Arm, Patient Position: Sitting, Cuff Size:  Normal)   Pulse (!) 108   Temp 97.9 F (36.6 C) (Temporal)   Ht 5\' 7"  (1.702 m)   Wt 148 lb 7 oz (67.3 kg)   LMP 09/10/2020   SpO2 99%   BMI 23.25 kg/m   Wt Readings from Last 3 Encounters:  09/24/20 148 lb 7 oz (67.3 kg)  01/20/20 148 lb 4 oz (67.2 kg)  01/12/20 148 lb (67.1 kg)      Physical Exam Vitals and nursing note reviewed.  Constitutional:      General: She is not in acute distress.    Appearance: Normal appearance. She is not ill-appearing.  HENT:     Head: Normocephalic and atraumatic.     Mouth/Throat:     Mouth: Mucous membranes are moist.     Pharynx: Oropharynx is clear. No oropharyngeal exudate or posterior oropharyngeal erythema.  Eyes:     Extraocular Movements: Extraocular movements intact.     Pupils: Pupils are equal, round, and reactive to light.  Cardiovascular:     Rate and Rhythm: Normal rate and regular rhythm.     Pulses: Normal pulses.     Heart sounds: Normal heart sounds. No murmur heard.   Pulmonary:     Effort: Pulmonary effort is normal. No respiratory distress.     Breath sounds: Normal breath sounds. No wheezing, rhonchi or rales.  Abdominal:     General: Abdomen is flat. Bowel sounds are normal. There is no distension.     Palpations: Abdomen is soft. There is no mass.     Tenderness: There is no abdominal tenderness. There is no guarding or rebound.     Hernia: No hernia is present.  Musculoskeletal:        General: Normal range of motion.     Right lower leg: No edema.     Left lower leg: No edema.  Skin:    General: Skin is warm and dry.     Findings: No rash.  Neurological:     General: No focal deficit present.     Mental Status: She is alert.     Cranial Nerves: Cranial nerves are intact.     Sensory: Sensation is intact.     Motor: Motor function is intact.     Coordination: Coordination is intact. Romberg sign negative. Coordination normal. Finger-Nose-Finger Test normal.     Gait: Gait is intact.     Comments:  CN  2-12 intact FTN intact EOMI Slight unsteadiness with romberg but otherwise normal No pronator drift  Psychiatric:        Mood and Affect: Mood normal.        Behavior: Behavior normal.       Lab Results  Component Value Date   TSH 1.820 12/05/2019    Assessment & Plan:  This visit occurred during the SARS-CoV-2 public health emergency.  Safety protocols were in place, including screening questions prior to the visit, additional usage of staff PPE, and extensive cleaning of exam room while observing appropriate contact time as indicated  for disinfecting solutions.   Problem List Items Addressed This Visit    Vitamin D deficiency    Update levels, currently off replacement.  Increased difficulty tolerating vitamins recently.       Relevant Orders   VITAMIN D 25 Hydroxy (Vit-D Deficiency, Fractures)   Paresthesias   Relevant Orders   Folate   Vitamin B12   Lightheaded - Primary    Recurring lightheadedness associated with multiple other symptoms including trouble staying asleep, pounding heart, upright deconditioning, continued heat intolerance and chronic fatigue. Update labwork including thyroid functions, inflamamtory marker, CBC, CMP, vitamin levels. Update with results.  For diverse symptoms discussed possible trial of SNRI - pt not interested. Discussed possible benzo trial - she would consider this - would choose short half life benzo like alprazolam or lorazepam. Valium previously not tolerated. Reviewed controlled substance precautions including habit forming nature, tolerance/dependence.  ?hormonal issue - consider GYN eval and OCP trial.       Relevant Orders   Sedimentation rate   T4, free   Basic metabolic panel   CBC with Differential/Platelet   TSH   VITAMIN D 25 Hydroxy (Vit-D Deficiency, Fractures)   Folate   Vitamin B12   CONSTIPATION, CHRONIC    Continue miralax 1.5 capful daily.           Meds ordered this encounter  Medications  . melatonin 5 MG  TABS    Sig: Take 1 tablet (5 mg total) by mouth at bedtime.   Orders Placed This Encounter  Procedures  . Sedimentation rate  . T4, free  . Basic metabolic panel  . CBC with Differential/Platelet  . TSH  . VITAMIN D 25 Hydroxy (Vit-D Deficiency, Fractures)  . Folate  . Vitamin B12    Patient Instructions  Labs today.  Try melatonin 5mg  at night to help sleep.  We could consider trial of other sleep/anxiety medicine as needed if melatonin doesn't help. Let me know.  Would return to GYN to discuss birth control.    Follow up plan: Return if symptoms worsen or fail to improve.  [name], MD

## 2020-09-24 NOTE — Patient Instructions (Addendum)
Labs today.  Try melatonin 5mg  at night to help sleep.  We could consider trial of other sleep/anxiety medicine as needed if melatonin doesn't help. Let me know.  Would return to GYN to discuss birth control.

## 2020-09-25 DIAGNOSIS — R202 Paresthesia of skin: Secondary | ICD-10-CM | POA: Insufficient documentation

## 2020-09-25 NOTE — Assessment & Plan Note (Addendum)
Update levels, currently off replacement.  Increased difficulty tolerating vitamins recently.

## 2020-09-25 NOTE — Assessment & Plan Note (Signed)
Continue miralax 1.5 capful daily.

## 2020-09-25 NOTE — Assessment & Plan Note (Addendum)
Recurring lightheadedness associated with multiple other symptoms including trouble staying asleep, pounding heart, upright deconditioning, continued heat intolerance and chronic fatigue. Update labwork including thyroid functions, inflamamtory marker, CBC, CMP, vitamin levels. Update with results.  For diverse symptoms discussed possible trial of SNRI - pt not interested. Discussed possible benzo trial - she would consider this - would choose short half life benzo like alprazolam or lorazepam. Valium previously not tolerated. Reviewed controlled substance precautions including habit forming nature, tolerance/dependence.  ?hormonal issue - consider GYN eval and OCP trial.

## 2020-09-28 ENCOUNTER — Telehealth: Payer: Self-pay

## 2020-09-28 NOTE — Telephone Encounter (Signed)
I recommend we wait to see results of labwork as it may reveal cause contributing to symptoms.

## 2020-09-28 NOTE — Telephone Encounter (Signed)
Pt would like to get a referral to see Dr. [name] at [facility]. She said she is still having same symptoms. Medication is not working.

## 2020-09-29 DIAGNOSIS — Z20828 Contact with and (suspected) exposure to other viral communicable diseases: Secondary | ICD-10-CM | POA: Diagnosis not present

## 2020-09-29 NOTE — Telephone Encounter (Signed)
Spoke with pt relaying Dr. Synthia Innocent message.  Pt verbalizes understanding stating if results come back clear, she expects to get requested referral.  FYI to Dr. Darnell Level.

## 2020-10-01 DIAGNOSIS — R42 Dizziness and giddiness: Secondary | ICD-10-CM | POA: Diagnosis not present

## 2020-10-01 DIAGNOSIS — R202 Paresthesia of skin: Secondary | ICD-10-CM | POA: Diagnosis not present

## 2020-10-01 DIAGNOSIS — E559 Vitamin D deficiency, unspecified: Secondary | ICD-10-CM | POA: Diagnosis not present

## 2020-10-02 ENCOUNTER — Other Ambulatory Visit: Payer: Self-pay | Admitting: Family Medicine

## 2020-10-02 ENCOUNTER — Encounter: Payer: Self-pay | Admitting: Family Medicine

## 2020-10-02 DIAGNOSIS — E559 Vitamin D deficiency, unspecified: Secondary | ICD-10-CM

## 2020-10-02 LAB — CBC WITH DIFFERENTIAL/PLATELET
Basophils Absolute: 0 10*3/uL (ref 0.0–0.2)
Basos: 1 %
EOS (ABSOLUTE): 0.1 10*3/uL (ref 0.0–0.4)
Eos: 1 %
Hematocrit: 39.7 % (ref 34.0–46.6)
Hemoglobin: 12.8 g/dL (ref 11.1–15.9)
Immature Grans (Abs): 0 10*3/uL (ref 0.0–0.1)
Immature Granulocytes: 0 %
Lymphocytes Absolute: 2.4 10*3/uL (ref 0.7–3.1)
Lymphs: 29 %
MCH: 27.6 pg (ref 26.6–33.0)
MCHC: 32.2 g/dL (ref 31.5–35.7)
MCV: 86 fL (ref 79–97)
Monocytes Absolute: 0.5 10*3/uL (ref 0.1–0.9)
Monocytes: 6 %
Neutrophils Absolute: 5.2 10*3/uL (ref 1.4–7.0)
Neutrophils: 63 %
Platelets: 284 10*3/uL (ref 150–450)
RBC: 4.64 x10E6/uL (ref 3.77–5.28)
RDW: 13.1 % (ref 11.7–15.4)
WBC: 8.2 10*3/uL (ref 3.4–10.8)

## 2020-10-02 LAB — BASIC METABOLIC PANEL
BUN/Creatinine Ratio: 10 (ref 9–23)
BUN: 9 mg/dL (ref 6–20)
CO2: 23 mmol/L (ref 20–29)
Calcium: 9.7 mg/dL (ref 8.7–10.2)
Chloride: 100 mmol/L (ref 96–106)
Creatinine, Ser: 0.87 mg/dL (ref 0.57–1.00)
GFR calc Af Amer: 105 mL/min/{1.73_m2} (ref >59)
GFR calc non Af Amer: 91 mL/min/{1.73_m2} (ref >59)
Glucose: 71 mg/dL (ref 65–99)
Potassium: 3.9 mmol/L (ref 3.5–5.2)
Sodium: 134 mmol/L (ref 134–144)

## 2020-10-02 LAB — VITAMIN D 25 HYDROXY (VIT D DEFICIENCY, FRACTURES): Vit D, 25-Hydroxy: 24.6 ng/mL — ABNORMAL LOW (ref 30.0–100.0)

## 2020-10-02 LAB — VITAMIN B12: Vitamin B-12: 472 pg/mL (ref 232–1245)

## 2020-10-02 LAB — TSH: TSH: 1.71 u[IU]/mL (ref 0.450–4.500)

## 2020-10-02 LAB — SEDIMENTATION RATE: Sed Rate: 2 mm/hr (ref 0–32)

## 2020-10-02 LAB — FOLATE: Folate: 12 ng/mL (ref >3.0)

## 2020-10-02 LAB — T4, FREE: Free T4: 1.31 ng/dL (ref 0.82–1.77)

## 2020-10-02 MED ORDER — VITAMIN D (CHOLECALCIFEROL) 10 MCG (400 UNIT) PO CAPS: 1.0000 | ORAL_CAPSULE | Freq: Every day | ORAL | Status: DC

## 2020-10-20 ENCOUNTER — Encounter: Payer: Self-pay | Admitting: Family Medicine

## 2020-10-20 ENCOUNTER — Ambulatory Visit: Payer: BC Managed Care – PPO | Admitting: Family Medicine

## 2020-10-20 ENCOUNTER — Other Ambulatory Visit: Payer: Self-pay

## 2020-10-20 VITALS — BP 106/70 | HR 86 | Temp 97.6°F | Ht 67.0 in | Wt 149.5 lb

## 2020-10-20 DIAGNOSIS — E559 Vitamin D deficiency, unspecified: Secondary | ICD-10-CM

## 2020-10-20 DIAGNOSIS — R42 Dizziness and giddiness: Secondary | ICD-10-CM

## 2020-10-20 DIAGNOSIS — G4700 Insomnia, unspecified: Secondary | ICD-10-CM

## 2020-10-20 DIAGNOSIS — E348 Other specified endocrine disorders: Secondary | ICD-10-CM

## 2020-10-20 MED ORDER — LORAZEPAM 0.5 MG PO TABS: 0.2500 mg | ORAL_TABLET | Freq: Every evening | ORAL | 0 refills | Status: DC | PRN

## 2020-10-20 NOTE — Assessment & Plan Note (Signed)
Ongoing trouble. Pt remains worried about incidental finding of pineal cyst, now with increasing insomnia requests return to neuro for recheck.

## 2020-10-20 NOTE — Progress Notes (Signed)
This visit was conducted in person.  BP 106/70 (BP Location: Left Arm, Patient Position: Sitting, Cuff Size: Normal)   Pulse 86   Temp 97.6 F (36.4 C) (Temporal)   Ht 5\' 7"  (1.702 m)   Wt 149 lb 8 oz (67.8 kg)   LMP 10/06/2020   SpO2 98%   BMI 23.42 kg/m    CC: insomnia Subjective:    Patient ID: Caroline [name], female    DOB: January 29, 1991, 29 y.o.   MRN: [id number]  HPI: Caroline [name] is a 29 y.o. female presenting on 10/20/2020 for Insomnia (C/o falling asleep and staying sleep.  Tried melatonin, not helping. )   See prior note for details.  Found to have vit D insufficiency, started on 400 IU daily. Has had trouble tolerating high doses in the past. Tolerating lower vit D dose as well as nature made brand vitamins better.   Insomnia - both trouble falling and staying asleep. Sleep hygiene reviewed, checklist provided. Melatonin 5mg  caused next morning grogginess - and didn't help insomnia. She tried 0.3mg  - better tolerated, but not effective for sleep. May do better with intermediate dose. Notes nausea and epigastric burning when she takes melatonin. Takes at 10pm.  Bedtime routine: winding down at 10pm, brushes teeth, drinks glass of milk or light snack. In bed by 11pm. Avoids daytime naps. Calm quiet dark environment for sleep (has black out curtains), cool temperature. Good daytime light exposure.   Has cut down on work load - no improvement in symptoms.   Notes mental fogginess - attributes to melatonin.  Has seen Rheum, pt states FM has been ruled out.  Planning to schedule GYN eval for possible hormonal contribution to chronic dizziness and malaise/fatigue - feels best during the week of her period. Off OCP for ~10 yrs. Periods are regular.   Remains worried about pineal cyst causing several of these symptoms. Requests return to Dr [name].      Relevant past medical, surgical, family and social history reviewed and updated as indicated. Interim medical history since our  last visit reviewed. Allergies and medications reviewed and updated. Outpatient Medications Prior to Visit  Medication Sig Dispense Refill  . ACZONE 7.5 % GEL Apply a small amount to affected area every morning    . cetirizine (ZYRTEC) 10 MG tablet Take 10 mg by mouth daily as needed for allergies.    . melatonin 5 MG TABS Take 1 tablet (5 mg total) by mouth at bedtime.    . Multiple Vitamins-Calcium (ONE-A-DAY WOMENS FORMULA PO) Take 1 tablet by mouth daily.     . polyethylene glycol (MIRALAX / GLYCOLAX) packet Take 17 g by mouth daily. As needed    . triamcinolone (NASACORT) 55 MCG/ACT AERO nasal inhaler Place 2 sprays into the nose daily. 1 Inhaler   . Vitamin D, Cholecalciferol, 10 MCG (400 UNIT) CAPS Take 1 capsule by mouth daily. 150 capsule    No facility-administered medications prior to visit.     Per HPI unless specifically indicated in ROS section below Review of Systems Objective:  BP 106/70 (BP Location: Left Arm, Patient Position: Sitting, Cuff Size: Normal)   Pulse 86   Temp 97.6 F (36.4 C) (Temporal)   Ht 5\' 7"  (1.702 m)   Wt 149 lb 8 oz (67.8 kg)   LMP 10/06/2020   SpO2 98%   BMI 23.42 kg/m   Wt Readings from Last 3 Encounters:  10/20/20 149 lb 8 oz (67.8 kg)  09/24/20 148 lb  7 oz (67.3 kg)  01/20/20 148 lb 4 oz (67.2 kg)      Physical Exam Vitals and nursing note reviewed.  Constitutional:      Appearance: Normal appearance. She is not ill-appearing.  Neurological:     Mental Status: She is alert.  Psychiatric:        Mood and Affect: Mood normal.        Behavior: Behavior normal.        Thought Content: Thought content normal.        Judgment: Judgment normal.       Results for orders placed or performed in visit on 09/24/20  Sedimentation rate  Result Value Ref Range   Sed Rate 2 0 - 32 mm/hr  T4, free  Result Value Ref Range   Free T4 1.31 0.82 - 1.77 ng/dL  Basic metabolic panel  Result Value Ref Range   Glucose 71 65 - 99 mg/dL   BUN 9  6 - 20 mg/dL   Creatinine, Ser 0.87 0.57 - 1.00 mg/dL   GFR calc non Af Amer 91 >59 mL/min/1.73   GFR calc Af Amer 105 >59 mL/min/1.73   BUN/Creatinine Ratio 10 9 - 23   Sodium 134 134 - 144 mmol/L   Potassium 3.9 3.5 - 5.2 mmol/L   Chloride 100 96 - 106 mmol/L   CO2 23 20 - 29 mmol/L   Calcium 9.7 8.7 - 10.2 mg/dL  CBC with Differential/Platelet  Result Value Ref Range   WBC 8.2 3.4 - 10.8 x10E3/uL   RBC 4.64 3.77 - 5.28 x10E6/uL   Hemoglobin 12.8 11.1 - 15.9 g/dL   Hematocrit 39.7 34.0 - 46.6 %   MCV 86 79 - 97 fL   MCH 27.6 26.6 - 33.0 pg   MCHC 32.2 31 - 35 g/dL   RDW 13.1 11.7 - 15.4 %   Platelets 284 150 - 450 x10E3/uL   Neutrophils 63 Not Estab. %   Lymphs 29 Not Estab. %   Monocytes 6 Not Estab. %   Eos 1 Not Estab. %   Basos 1 Not Estab. %   Neutrophils Absolute 5.2 1.40 - 7.00 x10E3/uL   Lymphocytes Absolute 2.4 0 - 3 x10E3/uL   Monocytes Absolute 0.5 0 - 0 x10E3/uL   EOS (ABSOLUTE) 0.1 0.0 - 0.4 x10E3/uL   Basophils Absolute 0.0 0 - 0 x10E3/uL   Immature Granulocytes 0 Not Estab. %   Immature Grans (Abs) 0.0 0.0 - 0.1 x10E3/uL  TSH  Result Value Ref Range   TSH 1.710 0.450 - 4.500 uIU/mL  VITAMIN D 25 Hydroxy (Vit-D Deficiency, Fractures)  Result Value Ref Range   Vit D, 25-Hydroxy 24.6 (L) 30.0 - 100.0 ng/mL  Folate  Result Value Ref Range   Folate 12.0 >3.0 ng/mL  Vitamin B12  Result Value Ref Range   Vitamin B-12 472 232 - 1,245 pg/mL   Assessment & Plan:  This visit occurred during the SARS-CoV-2 public health emergency.  Safety protocols were in place, including screening questions prior to the visit, additional usage of staff PPE, and extensive cleaning of exam room while observing appropriate contact time as indicated for disinfecting solutions.   Problem List Items Addressed This Visit    Vitamin D deficiency    Tolerating 400 IU dose better. Continue, recheck levels next month (labs ordered to [facility]).       Relevant Orders   VITAMIN D 25  Hydroxy (Vit-D Deficiency, Fractures)   Pineal gland cyst  Lightheaded    Ongoing trouble. Pt remains worried about incidental finding of pineal cyst, now with increasing insomnia requests return to neuro for recheck.       Relevant Orders   Ambulatory referral to Neurology   Insomnia - Primary    What sounds like both sleep initiation and maintenance insomnia. Melatonin 5mg  overly sedating, 0.3mg  not as effective as desired, has had difficulty finding dose in between - she will continue looking for 1mg  dose. Sleep hygiene measures reviewed. Will provide trial benzo lorazepam 0.5mg  to use 1/2-1 tab at a time for sleep/stress over next few weeks, update with effect. Reviewed habit forming nature of benzodiazepine and need to use sparingly PRN.           Meds ordered this encounter  Medications  . LORazepam (ATIVAN) 0.5 MG tablet    Sig: Take 0.5-1 tablets (0.25-0.5 mg total) by mouth at bedtime as needed for anxiety or sleep.    Dispense:  20 tablet    Refill:  0   Orders Placed This Encounter  Procedures  . VITAMIN D 25 Hydroxy (Vit-D Deficiency, Fractures)    Standing Status:   Future    Number of Occurrences:   1    Standing Expiration Date:   10/20/2021  . Ambulatory referral to Neurology    Referral Priority:   Routine    Referral Type:   Consultation    Referral Reason:   Specialty Services Required    Requested Specialty:   Neurology    Number of Visits Requested:   1    Patient Instructions  We will refer you back to Dr [name].  Try to find 1mg  dose melatonin.  Schedule lab visit for vit D.   Sleep hygiene checklist: 1. Avoid naps during the day 2. Avoid stimulants such as caffeine and nicotine. Avoid bedtime alcohol (it can speed onset of sleep but the body's metabolism can cause awakenings). 3. All forms of exercise help ensure sound sleep - limit vigorous exercise to morning or late afternoon 4. Avoid food too close to bedtime including chocolate (which contains  caffeine) 5. Soak up natural light 6. Establish regular bedtime routine. 7. Associate bed with sleep - avoid TV, computer or phone, reading while in bed. 8. Ensure pleasant, relaxing sleep environment - quiet, dark, cool room.    Follow up plan: Return if symptoms worsen or fail to improve.  [name], MD

## 2020-10-20 NOTE — Assessment & Plan Note (Signed)
Tolerating 400 IU dose better. Continue, recheck levels next month (labs ordered to [facility]).

## 2020-10-20 NOTE — Patient Instructions (Addendum)
We will refer you back to Dr [name].  Try to find 1mg  dose melatonin.  Schedule lab visit for vit D.   Sleep hygiene checklist: 1. Avoid naps during the day 2. Avoid stimulants such as caffeine and nicotine. Avoid bedtime alcohol (it can speed onset of sleep but the body's metabolism can cause awakenings). 3. All forms of exercise help ensure sound sleep - limit vigorous exercise to morning or late afternoon 4. Avoid food too close to bedtime including chocolate (which contains caffeine) 5. Soak up natural light 6. Establish regular bedtime routine. 7. Associate bed with sleep - avoid TV, computer or phone, reading while in bed. 8. Ensure pleasant, relaxing sleep environment - quiet, dark, cool room.

## 2020-10-20 NOTE — Assessment & Plan Note (Signed)
What sounds like both sleep initiation and maintenance insomnia. Melatonin 5mg  overly sedating, 0.3mg  not as effective as desired, has had difficulty finding dose in between - she will continue looking for 1mg  dose. Sleep hygiene measures reviewed. Will provide trial benzo lorazepam 0.5mg  to use 1/2-1 tab at a time for sleep/stress over next few weeks, update with effect. Reviewed habit forming nature of benzodiazepine and need to use sparingly PRN.

## 2020-11-01 ENCOUNTER — Telehealth: Payer: Self-pay | Admitting: Family Medicine

## 2020-11-01 DIAGNOSIS — R300 Dysuria: Secondary | ICD-10-CM

## 2020-11-01 NOTE — Telephone Encounter (Signed)
What symptoms is she having? Need to know so I can order correctly

## 2020-11-01 NOTE — Telephone Encounter (Signed)
Spoke with pt asking what sxs she is having.  States she has some burning with and irritation with urination.

## 2020-11-01 NOTE — Telephone Encounter (Signed)
Pt called in wanted to know if Dr. Darnell Level can write an order for an urinalysis and send to [facility] with her labs that she is set to have on Wednesday. Please advise

## 2020-11-01 NOTE — Telephone Encounter (Signed)
Ordered

## 2020-11-01 NOTE — Addendum Note (Signed)
Addended by: [name] on: 11/01/2020 05:37 PM   Modules accepted: Orders

## 2020-11-01 NOTE — Telephone Encounter (Signed)
Spoke with pt asking which [facility].  She states [facility] on [address].

## 2020-11-03 DIAGNOSIS — R300 Dysuria: Secondary | ICD-10-CM | POA: Diagnosis not present

## 2020-11-03 DIAGNOSIS — E559 Vitamin D deficiency, unspecified: Secondary | ICD-10-CM | POA: Diagnosis not present

## 2020-11-03 LAB — MICROSCOPIC EXAMINATION

## 2020-11-04 ENCOUNTER — Ambulatory Visit (INDEPENDENT_AMBULATORY_CARE_PROVIDER_SITE_OTHER): Payer: BC Managed Care – PPO | Admitting: Dermatology

## 2020-11-04 ENCOUNTER — Other Ambulatory Visit: Payer: Self-pay

## 2020-11-04 DIAGNOSIS — D225 Melanocytic nevi of trunk: Secondary | ICD-10-CM | POA: Diagnosis not present

## 2020-11-04 DIAGNOSIS — L700 Acne vulgaris: Secondary | ICD-10-CM

## 2020-11-04 DIAGNOSIS — D485 Neoplasm of uncertain behavior of skin: Secondary | ICD-10-CM | POA: Diagnosis not present

## 2020-11-04 DIAGNOSIS — D229 Melanocytic nevi, unspecified: Secondary | ICD-10-CM

## 2020-11-04 DIAGNOSIS — L821 Other seborrheic keratosis: Secondary | ICD-10-CM

## 2020-11-04 DIAGNOSIS — D1800 Hemangioma unspecified site: Secondary | ICD-10-CM

## 2020-11-04 DIAGNOSIS — Z86018 Personal history of other benign neoplasm: Secondary | ICD-10-CM

## 2020-11-04 DIAGNOSIS — Z1283 Encounter for screening for malignant neoplasm of skin: Secondary | ICD-10-CM

## 2020-11-04 DIAGNOSIS — L814 Other melanin hyperpigmentation: Secondary | ICD-10-CM | POA: Diagnosis not present

## 2020-11-04 DIAGNOSIS — L578 Other skin changes due to chronic exposure to nonionizing radiation: Secondary | ICD-10-CM

## 2020-11-04 LAB — URINALYSIS, ROUTINE W REFLEX MICROSCOPIC
Bilirubin, UA: NEGATIVE
Glucose, UA: NEGATIVE
Ketones, UA: NEGATIVE
Nitrite, UA: NEGATIVE
Protein,UA: NEGATIVE
RBC, UA: NEGATIVE
Specific Gravity, UA: 1.018 (ref 1.005–1.030)
Urobilinogen, Ur: 0.2 mg/dL (ref 0.2–1.0)
pH, UA: 7 (ref 5.0–7.5)

## 2020-11-04 LAB — MICROSCOPIC EXAMINATION: Casts: NONE SEEN /lpf

## 2020-11-04 LAB — VITAMIN D 25 HYDROXY (VIT D DEFICIENCY, FRACTURES): Vit D, 25-Hydroxy: 29.7 ng/mL — ABNORMAL LOW (ref 30.0–100.0)

## 2020-11-04 NOTE — Patient Instructions (Addendum)
Wound Care Instructions  1. Cleanse wound gently with soap and water once a day then pat dry with clean gauze. Apply a thing coat of Petrolatum (petroleum jelly, "Vaseline") over the wound (unless you have an allergy to this). We recommend that you use a new, sterile tube of Vaseline. Do not pick or remove scabs. Do not remove the yellow or white "healing tissue" from the base of the wound.  2. Cover the wound with fresh, clean, nonstick gauze and secure with paper tape. You may use Band-Aids in place of gauze and tape if the would is small enough, but would recommend trimming much of the tape off as there is often too much. Sometimes Band-Aids can irritate the skin.  3. You should call the office for your biopsy report after 1 week if you have not already been contacted.  4. If you experience any problems, such as abnormal amounts of bleeding, swelling, significant bruising, significant pain, or evidence of infection, please call the office immediately.  5. FOR ADULT SURGERY PATIENTS: If you need something for pain relief you may take 1 extra strength Tylenol (acetaminophen) AND 2 Ibuprofen (200mg  each) together every 4 hours as needed for pain. (do not take these if you are allergic to them or if you have a reason you should not take them.) Typically, you may only need pain medication for 1 to 3 days.   Melanoma ABCDEs  Melanoma is the most dangerous type of skin cancer, and is the leading cause of death from skin disease.  You are more likely to develop melanoma if you:  Have light-colored skin, light-colored eyes, or red or blond hair  Spend a lot of time in the sun  Tan regularly, either outdoors or in a tanning bed  Have had blistering sunburns, especially during childhood  Have a close family member who has had a melanoma  Have atypical moles or large birthmarks  Early detection of melanoma is key since treatment is typically straightforward and cure rates are extremely high if we  catch it early.   The first sign of melanoma is often a change in a mole or a new dark spot.  The ABCDE system is a way of remembering the signs of melanoma.  A for asymmetry:  The two halves do not match. B for border:  The edges of the growth are irregular. C for color:  A mixture of colors are present instead of an even brown color. D for diameter:  Melanomas are usually (but not always) greater than 56mm - the size of a pencil eraser. E for evolution:  The spot keeps changing in size, shape, and color.  Please check your skin once per month between visits. You can use a small mirror in front and a large mirror behind you to keep an eye on the back side or your body.   If you see any new or changing lesions before your next follow-up, please call to schedule a visit.  Please continue daily skin protection including broad spectrum sunscreen SPF 30+ to sun-exposed areas, reapplying every 2 hours as needed when you're outdoors.   Your medications have been sent to [facility] and will be mailed to you after you call them and confirm your information with them.   [place] [phone] [address], [place], [place] [address]

## 2020-11-04 NOTE — Progress Notes (Signed)
Follow-Up Visit   Subjective  Caroline [name] is a 29 y.o. female who presents for the following: FBSE.  Patient here for full body skin exam and skin cancer screening. She has a history of dysplastic nevi. There is a spot on her back that she would like removed. Present for years but gets irritated by her bra strap. Patient has also been treated for acne. Currently she is only using Aczone but not consistently.   The following portions of the chart were reviewed this encounter and updated as appropriate:  Tobacco  Allergies  Meds  Problems  Med Hx  Surg Hx  Fam Hx      Review of Systems:  No other skin or systemic complaints except as noted in HPI or Assessment and Plan.  Objective  Well appearing patient in no apparent distress; mood and affect are within normal limits.  A full examination was performed including scalp, head, eyes, ears, nose, lips, neck, chest, axillae, abdomen, back, buttocks, bilateral upper extremities, bilateral lower extremities, hands, feet, fingers, toes, fingernails, and toenails. All findings within normal limits unless otherwise noted below.  Objective  Mid Back: R/o Irritated Nevus 0.4cm erythematous tan papule     Objective  Upper Mid Back: Superior -  0.5cm med to dark brown papule, symmetric, fried egg pattern Inferior - 0.35cm dark brown thin papule, symmetric  Images      Objective  Face: Trace open comedones of face   Assessment & Plan  Neoplasm of uncertain behavior of skin Mid Back  Epidermal / dermal shaving  Lesion diameter (cm):  0.4 Informed consent: discussed and consent obtained   Timeout: patient name, date of birth, surgical site, and procedure verified   Patient was prepped and draped in usual sterile fashion: area prepped with isopropyl alcohol. Anesthesia: the lesion was anesthetized in a standard fashion   Anesthetic:  1% lidocaine w/ epinephrine 1-100,000 buffered w/ 8.4% NaHCO3 Instrument used:  flexible razor blade   Hemostasis achieved with: aluminum chloride   Outcome: patient tolerated procedure well   Post-procedure details: wound care instructions given   Additional details:  Mupirocin and a bandage applied  Other Related Procedures Anatomic Pathology Report  Nevus Upper Mid Back  Benign-appearing.  Observation.  Call clinic for new or changing lesions.  Recommend daily use of broad spectrum spf 30+ sunscreen to sun-exposed areas.     Acne vulgaris Face  Chronic condition, no cure, only control. Currently well-controlled.  Continue Aczone daily    Lentigines - Scattered tan macules - Discussed due to sun exposure - Benign, observe - Call for any changes  Seborrheic Keratoses - Stuck-on, waxy, tan-brown papules and plaques  - Discussed benign etiology and prognosis. - Observe - Call for any changes  Melanocytic Nevi - Tan-brown and/or pink-flesh-colored symmetric macules and papules - Benign appearing on exam today - Observation - Call clinic for new or changing moles - Recommend daily use of broad spectrum spf 30+ sunscreen to sun-exposed areas.   Hemangiomas - Red papules - Discussed benign nature - Observe - Call for any changes  Actinic Damage - Chronic, secondary to cumulative UV/sun exposure - diffuse scaly erythematous macules with underlying dyspigmentation - Recommend daily broad spectrum sunscreen SPF 30+ to sun-exposed areas, reapply every 2 hours as needed.  - Call for new or changing lesions.  Skin cancer screening performed today.  History of Dysplastic Nevi - No evidence of recurrence today at left upper abdomen/mild atypia - Recommend regular full body  skin exams - Recommend daily broad spectrum sunscreen SPF 30+ to sun-exposed areas, reapply every 2 hours as needed.  - Call if any new or changing lesions are noted between office visits  Return in about 1 year (around 11/04/2021) for TBSE.  Graciella Belton, RMA, am acting  as scribe for Forest Gleason, MD .  Documentation: I have reviewed the above documentation for accuracy and completeness, and I agree with the above.  Forest Gleason, MD

## 2020-11-05 ENCOUNTER — Telehealth: Payer: Self-pay | Admitting: *Deleted

## 2020-11-05 ENCOUNTER — Other Ambulatory Visit: Payer: Self-pay | Admitting: Family Medicine

## 2020-11-05 MED ORDER — CEPHALEXIN 500 MG PO CAPS: 500.0000 mg | ORAL_CAPSULE | Freq: Two times a day (BID) | ORAL | 0 refills | Status: DC

## 2020-11-05 NOTE — Telephone Encounter (Signed)
Spoke with patient.  See mychart message.

## 2020-11-05 NOTE — Telephone Encounter (Signed)
Patient left a voicemail stating that she has gotten test results back thru mychart regarding urinalysis. Patent wants a call back regarding what is going on with her results.

## 2020-11-08 LAB — SPECIMEN STATUS REPORT

## 2020-11-08 LAB — URINE CULTURE

## 2020-11-15 DIAGNOSIS — Z6823 Body mass index (BMI) 23.0-23.9, adult: Secondary | ICD-10-CM | POA: Diagnosis not present

## 2020-11-15 DIAGNOSIS — N390 Urinary tract infection, site not specified: Secondary | ICD-10-CM | POA: Diagnosis not present

## 2020-11-15 DIAGNOSIS — R102 Pelvic and perineal pain: Secondary | ICD-10-CM | POA: Diagnosis not present

## 2020-11-21 ENCOUNTER — Encounter: Payer: Self-pay | Admitting: Dermatology

## 2020-12-07 DIAGNOSIS — N390 Urinary tract infection, site not specified: Secondary | ICD-10-CM | POA: Diagnosis not present

## 2020-12-07 DIAGNOSIS — N83201 Unspecified ovarian cyst, right side: Secondary | ICD-10-CM | POA: Diagnosis not present

## 2020-12-16 DIAGNOSIS — R309 Painful micturition, unspecified: Secondary | ICD-10-CM | POA: Diagnosis not present

## 2020-12-16 DIAGNOSIS — N898 Other specified noninflammatory disorders of vagina: Secondary | ICD-10-CM | POA: Diagnosis not present

## 2020-12-16 DIAGNOSIS — B373 Candidiasis of vulva and vagina: Secondary | ICD-10-CM | POA: Diagnosis not present
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
## 2021-01-06 ENCOUNTER — Encounter: Payer: Self-pay | Admitting: Neurology

## 2021-01-06 ENCOUNTER — Ambulatory Visit: Payer: BC Managed Care – PPO | Admitting: Neurology

## 2021-01-06 VITALS — BP 102/74 | HR 81 | Ht 67.0 in | Wt 151.0 lb

## 2021-01-06 DIAGNOSIS — E348 Other specified endocrine disorders: Secondary | ICD-10-CM

## 2021-01-06 DIAGNOSIS — R258 Other abnormal involuntary movements: Secondary | ICD-10-CM

## 2021-01-06 DIAGNOSIS — R29898 Other symptoms and signs involving the musculoskeletal system: Secondary | ICD-10-CM | POA: Diagnosis not present

## 2021-01-06 DIAGNOSIS — R55 Syncope and collapse: Secondary | ICD-10-CM

## 2021-01-06 DIAGNOSIS — R269 Unspecified abnormalities of gait and mobility: Secondary | ICD-10-CM | POA: Diagnosis not present

## 2021-01-06 DIAGNOSIS — R292 Abnormal reflex: Secondary | ICD-10-CM

## 2021-01-06 DIAGNOSIS — R2689 Other abnormalities of gait and mobility: Secondary | ICD-10-CM

## 2021-01-06 DIAGNOSIS — R251 Tremor, unspecified: Secondary | ICD-10-CM

## 2021-01-06 NOTE — Patient Instructions (Signed)
MRI of the brain and MRI cervical spine

## 2021-01-07 DIAGNOSIS — N83209 Unspecified ovarian cyst, unspecified side: Secondary | ICD-10-CM | POA: Insufficient documentation

## 2021-01-13 ENCOUNTER — Telehealth: Payer: Self-pay | Admitting: Neurology

## 2021-01-13 NOTE — Telephone Encounter (Signed)
[employer] auth: [employer] spoke to Iantha Fallen Ref # [id number] bc the plan does not participate with [employer] order faxed to [facility] they will reach out to the patient to schedule

## 2021-01-17 ENCOUNTER — Telehealth: Payer: Self-pay | Admitting: Neurology

## 2021-01-17 NOTE — Telephone Encounter (Signed)
Noted thanks °

## 2021-01-17 NOTE — Telephone Encounter (Signed)
Pt called, [facility] does not have a authorization code for my MRI. [facility] said cannot schedule MRI of brain and cervical spine with or without contrast until have the authorization code. Pt would like a call from the nurse.

## 2021-01-17 NOTE — Telephone Encounter (Signed)
Patient is scheduled at [facility] for 01/27/21

## 2021-01-27 DIAGNOSIS — R29898 Other symptoms and signs involving the musculoskeletal system: Secondary | ICD-10-CM | POA: Diagnosis not present

## 2021-01-27 DIAGNOSIS — G35 Multiple sclerosis: Secondary | ICD-10-CM | POA: Diagnosis not present

## 2021-01-27 DIAGNOSIS — M4312 Spondylolisthesis, cervical region: Secondary | ICD-10-CM | POA: Diagnosis not present

## 2021-01-27 DIAGNOSIS — M50222 Other cervical disc displacement at C5-C6 level: Secondary | ICD-10-CM | POA: Diagnosis not present

## 2021-01-27 DIAGNOSIS — R2689 Other abnormalities of gait and mobility: Secondary | ICD-10-CM | POA: Diagnosis not present

## 2021-01-27 DIAGNOSIS — R251 Tremor, unspecified: Secondary | ICD-10-CM | POA: Diagnosis not present

## 2021-01-28 NOTE — Telephone Encounter (Signed)
Pt called, link sent for my MRI from [facility] was [employer] Wanting to confirm that you are sent the MRI results. If not I can request a disc. Would like a call from the nurse.

## 2021-01-31 ENCOUNTER — Encounter: Payer: Self-pay | Admitting: *Deleted

## 2021-01-31 NOTE — Telephone Encounter (Signed)
Thank you :)

## 2021-01-31 NOTE — Telephone Encounter (Signed)
Request made

## 2021-01-31 NOTE — Telephone Encounter (Signed)
MRi of the brain normal. MRI cervical spine essentially normal, some very minima disc bulging nothing significant whatsoever thanks

## 2021-02-03 ENCOUNTER — Other Ambulatory Visit: Payer: Self-pay | Admitting: Neurology

## 2021-02-03 DIAGNOSIS — R531 Weakness: Secondary | ICD-10-CM

## 2021-02-07 DIAGNOSIS — N390 Urinary tract infection, site not specified: Secondary | ICD-10-CM | POA: Diagnosis not present

## 2021-02-09 DIAGNOSIS — M9901 Segmental and somatic dysfunction of cervical region: Secondary | ICD-10-CM | POA: Diagnosis not present

## 2021-02-09 DIAGNOSIS — M531 Cervicobrachial syndrome: Secondary | ICD-10-CM | POA: Diagnosis not present

## 2021-02-09 DIAGNOSIS — M50222 Other cervical disc displacement at C5-C6 level: Secondary | ICD-10-CM | POA: Diagnosis not present

## 2021-02-11 ENCOUNTER — Telehealth: Payer: Self-pay

## 2021-02-11 ENCOUNTER — Telehealth: Payer: BC Managed Care – PPO | Admitting: Family Medicine

## 2021-02-11 DIAGNOSIS — R0981 Nasal congestion: Secondary | ICD-10-CM

## 2021-02-11 MED ORDER — AMOXICILLIN-POT CLAVULANATE 500-125 MG PO TABS: 1.0000 | ORAL_TABLET | Freq: Three times a day (TID) | ORAL | 0 refills | Status: DC

## 2021-02-11 NOTE — Patient Instructions (Addendum)
HOME CARE TIPS:  -[place] testing information: https://www.[facility].org/ OR 252-088-4901 Most pharmacies also offer testing and home test kits.  -I sent the medication(s) we discussed to your pharmacy: Meds ordered this encounter  Medications  . amoxicillin-clavulanate (AUGMENTIN) 500-125 MG tablet    Sig: Take 1 tablet (500 mg total) by mouth 3 (three) times daily.    Dispense:  20 tablet    Refill:  0    -nasal saline twice daily  -please hold off on the flonase for 3-4 days until no more nose bleed  -can use tylenol or aleve if needed for fevers, aches and pains per instructions  -stay hydrated, drink plenty of fluids and eat small healthy meals - avoid dairy  -can take 1000 IU (20mcg) Vit D3 and 100-500 mg of Vit C daily per instructions  -If the Covid test is positive, check out the CDC website for more information on home care, transmission and treatment for COVID19  -follow up with your doctor in 2-3 days unless improving and feeling better  -stay home while sick, except to seek medical care, and if you have COVID19 ideally it would be best to stay home for a full 10 days since the onset of symptoms PLUS one day of no fever and feeling better. Wear a good mask (such as N95 or KN95) if around others to reduce the risk of transmission.  It was nice to meet you today, and I really hope you are feeling better soon. I help [facility] out with telemedicine visits on Tuesdays and Thursdays and am available for visits on those days. If you have any concerns or questions following this visit please schedule a follow up visit with your Primary Care doctor or seek care at a local urgent care clinic to avoid delays in care.    Seek in person care or schedule a follow up video visit promptly if your symptoms worsen, new concerns arise or you are not improving with treatment. Call 911 and/or seek emergency care if your symptoms are severe or  life threatening.   Nosebleed, Adult A nosebleed is when blood comes out of the nose. Nosebleeds are common and can be caused by many things. They are usually not a sign of a serious medical problem. Follow these instructions at home: When you have a nosebleed:  Sit down.  Tilt your head a little forward.  Follow these steps: 1. Pinch your nose with a clean towel or tissue. 2. Keep pinching your nose for 5 minutes. Do not let go. 3. After 5 minutes, let go of your nose. 4. If there is still bleeding, do these steps again. Keep doing these steps until the bleeding stops.  Do not put tissues or other things in your nose to stop the bleeding.  Avoid lying down or putting your head back.  Use a nose spray decongestant as told by your doctor.   After a nosebleed:  Try not to blow your nose or sniffle for several hours.  Try not to strain, lift, or bend at the waist for several days.  Aspirin and blood-thinning medicines make bleeding more likely. If you take these medicines: ? Ask your doctor if you should stop taking them or if you should change how much you take. ? Do not stop taking the medicine unless your doctor tells you to.  If your nosebleed was caused by dryness, use over-the-counter saline nasal spray or gel and humidifier as told by your doctor. This will keep the inside  of your nose moist and allow it to heal. If you need to use one of these products: ? Choose one that is water-soluble. ? Use only as much as you need and use it only as often as needed. ? Do not lie down right away after you use it.  If you get nosebleeds often, talk with your doctor about treatments. These may include: ? Nasal cautery. A chemical swab or electrical device is used to lightly burn tiny blood vessels inside the nose. This helps stop or prevent nosebleeds. ? Nasal packing. A gauze or other material is placed in the nose to keep constant pressure on the bleeding area. Contact a doctor  if:  You have a fever.  You get nosebleeds often.  You are getting nosebleeds more often than usual.  You bruise very easily.  You have something stuck in your nose.  You have bleeding in your mouth.  You vomit or cough up brown material.  You get a nosebleed after you start a new medicine. Get help right away if:  You have a nosebleed after you fall or hurt your head.  Your nosebleed does not go away after 20 minutes.  You feel dizzy or weak.  You have unusual bleeding from other parts of your body.  You have unusual bruising on other parts of your body.  You get sweaty.  You vomit blood. Summary  Nosebleeds are common. They are usually not a sign of a serious medical problem.  When you have a nosebleed, sit down and tilt your head a little forward. Pinch your nose with a clean tissue for 5 minutes.  Use saline spray or saline gel and a humidifier as told by your doctor.  Get help right away if your nosebleed does not go away after 20 minutes. This information is not intended to replace advice given to you by your health care provider. Make sure you discuss any questions you have with your health care provider. Document Revised: 10/09/2019 Document Reviewed: 10/09/2019 Elsevier Patient Education  2021 [employer].

## 2021-02-11 NOTE — Telephone Encounter (Signed)
Per appt notes pt already has video visit with DR Maudie Mercury 02/11/21 at 3:40. Sending note to Dr Darnell Level as PCP and Dr Maudie Mercury.

## 2021-02-11 NOTE — Telephone Encounter (Signed)
[facility] - Client TELEPHONE ADVICE RECORD [facility] Patient Name: Caroline [name] Gender: Female DOB: 01/29/1991 Age: 30 Y 2 M 9 D Return Phone Number: [phone] (Primary) [place] [address] Client  [facility] - Client Client Site [place] Physician [name] - MD Contact Type Call Who Is Calling Patient / Member / Family / Caregiver Call Type Triage / Clinical Relationship To Patient Self Return Phone Number [phone] (Primary) Chief Complaint Coughing Up Blood Reason for Call Symptomatic / Request for [place] states when she blows her nose she has blood as well as coughing up some mucus with blood in it as well. She has a virtual appointment on Monday. Translation No Nurse Assessment Nurse: Alveta Heimlich, RN, Rise Paganini Date/Time (Eastern Time): 02/11/2021 10:40:58 AM Confirm and document reason for call. If symptomatic, describe symptoms. ---Caller states she is having coughing with her sinus problems and she is getting blood in her nasal drainage and coughing up blood with mucous. She thinks she has infection. Her thermometer has died and does not have one and she is feeling feverish. Also achy. This started 2 days ago but has worsened last night. Does the patient have any new or worsening symptoms? ---Yes Will a triage be completed? ---Yes Related visit to physician within the last 2 weeks? ---No Does the PT have any chronic conditions? (i.e. diabetes, asthma, this includes High risk factors for pregnancy, etc.) ---Yes List chronic conditions. ---Clonic inertia, GI related. Is the patient pregnant or possibly pregnant? (Ask all females between the ages of 37-55) ---No Is this a behavioral health or substance abuse call? ---No Guidelines Guideline Title Affirmed Question Affirmed Notes Nurse Date/Time  (Eastern Time) Coughing Up Blood [1] Nasal discharge AND [2] present > 10 days Myer Haff 02/11/2021 10:45:23 AM Disp. Time Eilene Ghazi Time) Disposition Final User 02/11/2021 10:52:22 AM See PCP within 24 Hours Yes Alveta Heimlich, RN, Rise Paganini PLEASE NOTE: All timestamps contained within this report are represented as [place] Standard Time. CONFIDENTIALTY NOTICE: This fax transmission is intended only for the addressee. It contains information that is legally privileged, confidential or otherwise protected from use or disclosure. If you are not the intended recipient, you are strictly prohibited from reviewing, disclosing, copying using or disseminating any of this information or taking any action in reliance on or regarding this information. If you have received this fax in error, please notify us immediately by telephone so that we can arrange for its return to [place]. Phone: [phone], Toll-Free: [phone], Fax: [phone] Page: 2 of 2 Call Id: [id number] [place] Disagree/Comply Comply Caller Understands Yes PreDisposition Call Doctor Care Advice Given Per Guideline SEE PCP WITHIN 24 HOURS: * IF OFFICE WILL BE OPEN: You need to be examined within the next 24 hours. Call your doctor (or NP/PA) when the office opens and make an appointment. CALL BACK IF: * You cough up more than a tablespoon of pure red blood * Difficulty breathing * You become worse CARE ADVICE given per Coughing Up Blood guideline. Referrals REFERRED TO PCP OFFICE

## 2021-02-11 NOTE — Progress Notes (Signed)
Virtual Visit via Video Note  I connected with Caroline [name]  on 02/11/21 at  3:40 PM EST by a video enabled telemedicine application and verified that I am speaking with the correct person using two identifiers.  Location patient: home, [place] Location provider:work or home office Persons participating in the virtual visit: patient, provider  I discussed the limitations of evaluation and management by telemedicine and the availability of in person appointments. The patient expressed understanding and agreed to proceed.   HPI:  Acute telemedicine visit for sinus congestion: -Onset: has had some sinus issues the last few weeks with her chronic allergies, then worse the last 4 days ago -Symptoms include: nasal congestion, chills, had streaks of blood in nasal discharge and PND a few times, cough, laryngitis, nausea, poor appetite, feels tired -she uses flonase -Denies: fevers, CP, SOB, inability to eat/drink/get out of bed, known sick contacts, she does not feel that she is coughing up any blood from her chest feels it is from the nasal discharge -reports knows when she gets this way that she needs an antibiotic, despite listed intolerances she reports that augmentin 500mg  bid for 10-14 days is the only antibiotic that she does well with that clears up her sinus infections. Reports this is what her PCP has given her. If takes higher dose it upsets her stomach. -Pertinent medication allergies: amoxicillin upsets stomach if high dose - reports tolerated low dose fine, doxy, linzess -COVID-19 vaccine status: not vaccinated   ROS: See pertinent positives and negatives per HPI.  Past Medical History:  Diagnosis Date  . Colonic inertia 2007   Dr [name] ([facility])  . Dysplastic nevus 11/19/2019   Left upper abdomen. Mild atypia.  Marland Kitchen History of chicken pox   . Influenza B 11/19/2018  . Seasonal allergic rhinitis     Past Surgical History:  Procedure Laterality Date  . COLONOSCOPY  2010   WNL  [name])  . EXCISION MORTON'S NEUROMA Left 2007  . WISDOM TOOTH EXTRACTION  2010     Current Outpatient Medications:  .  amoxicillin-clavulanate (AUGMENTIN) 500-125 MG tablet, Take 1 tablet (500 mg total) by mouth 3 (three) times daily., Disp: 20 tablet, Rfl: 0 .  ACZONE 7.5 % GEL, Apply a small amount to affected area every morning, Disp: , Rfl:  .  cetirizine (ZYRTEC) 10 MG tablet, Take 10 mg by mouth daily as needed for allergies., Disp: , Rfl:  .  LORazepam (ATIVAN) 0.5 MG tablet, Take 0.5-1 tablets (0.25-0.5 mg total) by mouth at bedtime as needed for anxiety or sleep., Disp: 20 tablet, Rfl: 0 .  Multiple Vitamins-Calcium (ONE-A-DAY WOMENS FORMULA PO), Take 1 tablet by mouth daily. , Disp: , Rfl:  .  polyethylene glycol (MIRALAX / GLYCOLAX) packet, Take 17 g by mouth daily. As needed, Disp: , Rfl:  .  triamcinolone (NASACORT) 55 MCG/ACT AERO nasal inhaler, Place 2 sprays into the nose daily., Disp: 1 Inhaler, Rfl:  .  Vitamin D, Cholecalciferol, 10 MCG (400 UNIT) CAPS, Take 1 capsule by mouth daily., Disp: 150 capsule, Rfl:   EXAM:  VITALS per patient if applicable:  GENERAL: alert, oriented, appears well and in no acute distress  HEENT: atraumatic, conjunttiva clear, no obvious abnormalities on inspection of external nose and ears  NECK: normal movements of the head and neck  LUNGS: on inspection no signs of respiratory distress, breathing rate appears normal, no obvious gross SOB, gasping or wheezing  CV: no obvious cyanosis  MS: moves all visible extremities without  noticeable abnormality  PSYCH/NEURO: pleasant and cooperative, no obvious depression or anxiety, speech and thought processing grossly intact  ASSESSMENT AND PLAN:  Discussed the following assessment and plan:  Sinus congestion  -we discussed possible serious and likely etiologies, options for evaluation and workup, limitations of telemedicine visit vs in person visit, treatment, treatment risks and  precautions. Pt prefers to treat via telemedicine empirically rather than in person at this moment.  Query viral upper respiratory illness, possible Covid versus possible bacterial sinusitis given she reports several weeks of sinus congestion before worsening versus other.  She has opted for nasal saline, Covid testing and starting Augmentin 500 mg twice daily for 10 days.  Augmentin is listed on the intolerance list, however she reports her PCP put this on here because she cannot tolerate a higher dose.  She prefers this antibiotic over all other ones though, as it works best for her and she tolerates better.  She is well aware of the risks.  Plans to stop antibiotic if Covid test is positive.  In case of positive Covid test, discussed treatment, potential complications, precautions and isolation.  Advised to stop Flonase for several days given she has seen some streaks of blood in the nasal discharge.  Also advised prompt in-person evaluation if coughing up blood, chest symptoms, worsening or if she has a nosebleed which she cannot stop with 10 seconds of pressure or a pop of bleeding. Work/School slipped offered: pr declined Scheduled follow up with PCP offered: She agrees to contact PCP office for follow-up if needed. Advised to seek prompt in person care if worsening, new symptoms arise, or if is not improving with treatment. Discussed options for inperson care if PCP office not available. Did let this patient know that I only do telemedicine on Tuesdays and Thursdays for [place]. Advised to schedule follow up visit with PCP or [facility] if any further questions or concerns to avoid delays in care.   I discussed the assessment and treatment plan with the patient. The patient was provided an opportunity to ask questions and all were answered. The patient agreed with the plan and demonstrated an understanding of the instructions.     Lucretia Kern, DO

## 2021-02-14 ENCOUNTER — Telehealth: Payer: BC Managed Care – PPO | Admitting: Family Medicine

## 2021-02-14 DIAGNOSIS — M531 Cervicobrachial syndrome: Secondary | ICD-10-CM | POA: Diagnosis not present

## 2021-02-14 DIAGNOSIS — M9901 Segmental and somatic dysfunction of cervical region: Secondary | ICD-10-CM | POA: Diagnosis not present

## 2021-02-14 DIAGNOSIS — Z20822 Contact with and (suspected) exposure to covid-19: Secondary | ICD-10-CM | POA: Diagnosis not present

## 2021-02-14 DIAGNOSIS — M50222 Other cervical disc displacement at C5-C6 level: Secondary | ICD-10-CM | POA: Diagnosis not present

## 2021-02-14 NOTE — Telephone Encounter (Signed)
Seen virtually, treating for sinusitis with low dose augmentin course.

## 2021-02-15 ENCOUNTER — Telehealth: Payer: Self-pay | Admitting: *Deleted

## 2021-02-15 MED ORDER — AMOXICILLIN 500 MG PO CAPS: 500.0000 mg | ORAL_CAPSULE | Freq: Two times a day (BID) | ORAL | 0 refills | Status: DC

## 2021-02-15 NOTE — Telephone Encounter (Signed)
Agree with holding abx until nausea, cramps, loose stools and bad taste in mouth improve.  Looks like plan was to take antibiotic twice daily but it was written as three times daily.  Would offer trying plain amoxicillin at 500mg  twice daily once feeling up to retrial which I believe she has tolerated well in the past - sent this in to pharmacy.  Recommend nasal saline irrigation throughout the day, hopefully the blood tinged mucous has improved with holding flonase.  Would recommend she start OTC probiotic towards end of abx course to help with GI symptoms after antibiotic.

## 2021-02-15 NOTE — Telephone Encounter (Addendum)
Patient called stating that she did a video visit with Colin Benton DO 02/11/21. Caroline [name] stated that she was given an antibiotic for a sinus infection. Patient stated that she discussed with the provider that she was allergic to Amoxicillin.  Patient stated that they agreed that she would try a low dose antibiotic and would take it two times a day. Patient stated when she picked it up it was prescribed 3 times a day. Patient stated that the medication has caused her to be nauseated, have cramps and loose stools. Patient stated the medication has left a terrible taste in her month and can not get the taste out.Patient stated that she did not take it yesterday or today. Patient stated that she still has all the symptoms of a sinus infection that she had discussed with Colin Benton DO. Patient was advised at the video visit if she had any problems with the antibiotic to contact this office because of [place] work scheduled . Pharmacy [facility] [address]

## 2021-02-16 DIAGNOSIS — M50222 Other cervical disc displacement at C5-C6 level: Secondary | ICD-10-CM | POA: Diagnosis not present

## 2021-02-16 DIAGNOSIS — M531 Cervicobrachial syndrome: Secondary | ICD-10-CM | POA: Diagnosis not present

## 2021-02-16 DIAGNOSIS — M9901 Segmental and somatic dysfunction of cervical region: Secondary | ICD-10-CM | POA: Diagnosis not present

## 2021-02-16 NOTE — Telephone Encounter (Signed)
Left message on vm per dpr relaying Dr. G's message.  

## 2021-02-17 ENCOUNTER — Ambulatory Visit: Payer: BC Managed Care – PPO | Admitting: Neurology

## 2021-02-17 ENCOUNTER — Ambulatory Visit (INDEPENDENT_AMBULATORY_CARE_PROVIDER_SITE_OTHER): Payer: BC Managed Care – PPO | Admitting: Neurology

## 2021-02-17 DIAGNOSIS — Z0289 Encounter for other administrative examinations: Secondary | ICD-10-CM

## 2021-02-17 DIAGNOSIS — R29898 Other symptoms and signs involving the musculoskeletal system: Secondary | ICD-10-CM

## 2021-02-17 DIAGNOSIS — R531 Weakness: Secondary | ICD-10-CM

## 2021-02-17 NOTE — Progress Notes (Signed)
        Full Name: Caroline [name] Gender: Female MRN #: [id number] Date of Birth: 1991/01/26    Visit Date: 02/17/2021 10:05 Age: 30 Years Examining Physician: Sarina Ill, MD  Referring Provider: [name], MD Primary Care Physician:  [name], MD  History: left arm weakness Summary: EMG/NCS was performed on the left upper extremity. All nerves and muscles (as indicated in the following tables) were within normal limits.    Conclusion: This is a normal study.   Sarina Ill, M.D.  [facility] [address], [place], [place] [address] Tel: [phone] Fax: [phone]  Verbal informed consent was obtained from the patient, patient was informed of potential risk of procedure, including bruising, bleeding, hematoma formation, infection, muscle weakness, muscle pain, numbness, among others.        [place]    Nerve / Sites Muscle Latency Ref. Amplitude Ref. Rel Amp Segments Distance Velocity Ref. Area    ms ms mV mV %  cm m/s m/s mVms  L Median - APB     Wrist APB 2.9 ?4.4 10.3 ?4.0 100 Wrist - APB 7   33.3     Upper arm APB 6.6  10.4  101 Upper arm - Wrist 22 60 ?49 34.2  L Ulnar - ADM     Wrist ADM 2.5 ?3.3 9.5 ?6.0 100 Wrist - ADM 7   30.6     B.Elbow ADM 5.5  9.4  98.2 B.Elbow - Wrist 18 60 ?49 32.7     A.Elbow ADM 7.3  9.4  100 A.Elbow - B.Elbow 10 56 ?49 30.6         SNC    Nerve / Sites Rec. Site Peak Lat Ref.  Amp Ref. Segments Distance Peak Diff Ref.    ms ms V V  cm ms ms  L Median, Ulnar - Transcarpal comparison     Median Palm Wrist 1.8 ?2.2 150 ?35 Median Palm - Wrist 8       Ulnar Palm Wrist 2.0 ?2.2 61 ?12 Ulnar Palm - Wrist 8          Median Palm - Ulnar Palm  -0.2 ?0.4  L Median - Orthodromic (Dig II, Mid palm)     Dig II Wrist 2.6 ?3.4 40 ?10 Dig II - Wrist 13    L Ulnar - Orthodromic, (Dig V, Mid palm)     Dig V Wrist 2.6 ?3.1 15 ?5 Dig V - Wrist 57             F  Wave    Nerve F Lat Ref.   ms ms  L Ulnar  - ADM 26.0 ?32.0       EMG Summary Table    Spontaneous MUAP Recruitment  Muscle IA Fib PSW Fasc Other Amp Dur. Poly Pattern  L. Deltoid Normal None None None _______ Normal Normal Normal Normal  L. Cervical paraspinals (low) Normal None None None _______ Normal Normal Normal Normal  L. Triceps brachii Normal None None None _______ Normal Normal Normal Normal  L. Pronator teres Normal None None None _______ Normal Normal Normal Normal  L. First dorsal interosseous Normal None None None _______ Normal Normal Normal Normal  L. Opponens pollicis Normal None None None _______ Normal Normal Normal Normal

## 2021-02-17 NOTE — Progress Notes (Signed)
See procedure note.

## 2021-02-17 NOTE — Procedures (Signed)
        Full Name: Caroline [name] Gender: Female MRN #: [id number] Date of Birth: 04-30-1991    Visit Date: 02/17/2021 10:05 Age: 30 Years Examining Physician: Sarina Ill, MD  Referring Provider: [name], MD Primary Care Physician:  [name], MD  History: left arm weakness Summary: EMG/NCS was performed on the left upper extremity. All nerves and muscles (as indicated in the following tables) were within normal limits.    Conclusion: This is a normal study.   Sarina Ill, M.D.  [facility] [address] [place], [place] [address] Tel: [phone] Fax: [phone]  Verbal informed consent was obtained from the patient, patient was informed of potential risk of procedure, including bruising, bleeding, hematoma formation, infection, muscle weakness, muscle pain, numbness, among others.        [place]    Nerve / Sites Muscle Latency Ref. Amplitude Ref. Rel Amp Segments Distance Velocity Ref. Area    ms ms mV mV %  cm m/s m/s mVms  L Median - APB     Wrist APB 2.9 ?4.4 10.3 ?4.0 100 Wrist - APB 7   33.3     Upper arm APB 6.6  10.4  101 Upper arm - Wrist 22 60 ?49 34.2  L Ulnar - ADM     Wrist ADM 2.5 ?3.3 9.5 ?6.0 100 Wrist - ADM 7   30.6     B.Elbow ADM 5.5  9.4  98.2 B.Elbow - Wrist 18 60 ?49 32.7     A.Elbow ADM 7.3  9.4  100 A.Elbow - B.Elbow 10 56 ?49 30.6         SNC    Nerve / Sites Rec. Site Peak Lat Ref.  Amp Ref. Segments Distance Peak Diff Ref.    ms ms V V  cm ms ms  L Median, Ulnar - Transcarpal comparison     Median Palm Wrist 1.8 ?2.2 150 ?35 Median Palm - Wrist 8       Ulnar Palm Wrist 2.0 ?2.2 61 ?12 Ulnar Palm - Wrist 8          Median Palm - Ulnar Palm  -0.2 ?0.4  L Median - Orthodromic (Dig II, Mid palm)     Dig II Wrist 2.6 ?3.4 40 ?10 Dig II - Wrist 13    L Ulnar - Orthodromic, (Dig V, Mid palm)     Dig V Wrist 2.6 ?3.1 15 ?5 Dig V - Wrist 102             F  Wave    Nerve F Lat Ref.   ms ms  L Ulnar  - ADM 26.0 ?32.0       EMG Summary Table    Spontaneous MUAP Recruitment  Muscle IA Fib PSW Fasc Other Amp Dur. Poly Pattern  L. Deltoid Normal None None None _______ Normal Normal Normal Normal  L. Cervical paraspinals (low) Normal None None None _______ Normal Normal Normal Normal  L. Triceps brachii Normal None None None _______ Normal Normal Normal Normal  L. Pronator teres Normal None None None _______ Normal Normal Normal Normal  L. First dorsal interosseous Normal None None None _______ Normal Normal Normal Normal  L. Opponens pollicis Normal None None None _______ Normal Normal Normal Normal

## 2021-02-22 DIAGNOSIS — M50222 Other cervical disc displacement at C5-C6 level: Secondary | ICD-10-CM | POA: Diagnosis not present

## 2021-02-22 DIAGNOSIS — M9901 Segmental and somatic dysfunction of cervical region: Secondary | ICD-10-CM | POA: Diagnosis not present

## 2021-02-22 DIAGNOSIS — M531 Cervicobrachial syndrome: Secondary | ICD-10-CM | POA: Diagnosis not present

## 2021-03-01 DIAGNOSIS — M531 Cervicobrachial syndrome: Secondary | ICD-10-CM | POA: Diagnosis not present

## 2021-03-01 DIAGNOSIS — M50222 Other cervical disc displacement at C5-C6 level: Secondary | ICD-10-CM | POA: Diagnosis not present

## 2021-03-01 DIAGNOSIS — M9901 Segmental and somatic dysfunction of cervical region: Secondary | ICD-10-CM | POA: Diagnosis not present

## 2021-03-08 DIAGNOSIS — M9902 Segmental and somatic dysfunction of thoracic region: Secondary | ICD-10-CM | POA: Diagnosis not present

## 2021-03-08 DIAGNOSIS — M9901 Segmental and somatic dysfunction of cervical region: Secondary | ICD-10-CM | POA: Diagnosis not present

## 2021-03-08 DIAGNOSIS — M531 Cervicobrachial syndrome: Secondary | ICD-10-CM | POA: Diagnosis not present

## 2021-03-08 DIAGNOSIS — M50222 Other cervical disc displacement at C5-C6 level: Secondary | ICD-10-CM | POA: Diagnosis not present

## 2021-03-18 ENCOUNTER — Telehealth: Payer: Self-pay | Admitting: Neurology

## 2021-03-18 NOTE — Telephone Encounter (Signed)
I emailed with her, please disregard thanks

## 2021-03-18 NOTE — Telephone Encounter (Signed)
For pt's employer she is being told she needs a Covid-19 vaccine, pt would like to know how to get an exemption from Dr [name], please call

## 2021-03-18 NOTE — Telephone Encounter (Signed)
I'm not aware of any neurologic contraindication she has. I will see if she answers an email thanks

## 2021-03-22 DIAGNOSIS — M9901 Segmental and somatic dysfunction of cervical region: Secondary | ICD-10-CM | POA: Diagnosis not present

## 2021-03-22 DIAGNOSIS — M9902 Segmental and somatic dysfunction of thoracic region: Secondary | ICD-10-CM | POA: Diagnosis not present

## 2021-03-22 DIAGNOSIS — M50222 Other cervical disc displacement at C5-C6 level: Secondary | ICD-10-CM | POA: Diagnosis not present

## 2021-03-22 DIAGNOSIS — M531 Cervicobrachial syndrome: Secondary | ICD-10-CM | POA: Diagnosis not present

## 2021-04-01 DIAGNOSIS — N926 Irregular menstruation, unspecified: Secondary | ICD-10-CM | POA: Diagnosis not present

## 2021-04-01 DIAGNOSIS — R5383 Other fatigue: Secondary | ICD-10-CM | POA: Diagnosis not present

## 2021-04-04 DIAGNOSIS — R5383 Other fatigue: Secondary | ICD-10-CM | POA: Diagnosis not present

## 2021-04-04 DIAGNOSIS — N926 Irregular menstruation, unspecified: Secondary | ICD-10-CM | POA: Diagnosis not present

## 2021-04-05 ENCOUNTER — Encounter: Payer: Self-pay | Admitting: Cardiovascular Disease

## 2021-04-05 ENCOUNTER — Ambulatory Visit: Payer: BC Managed Care – PPO | Admitting: Cardiovascular Disease

## 2021-04-05 ENCOUNTER — Other Ambulatory Visit: Payer: Self-pay

## 2021-04-05 DIAGNOSIS — R5381 Other malaise: Secondary | ICD-10-CM

## 2021-04-05 DIAGNOSIS — R55 Syncope and collapse: Secondary | ICD-10-CM | POA: Diagnosis not present

## 2021-04-05 DIAGNOSIS — R42 Dizziness and giddiness: Secondary | ICD-10-CM | POA: Diagnosis not present

## 2021-04-05 DIAGNOSIS — K599 Functional intestinal disorder, unspecified: Secondary | ICD-10-CM

## 2021-04-05 DIAGNOSIS — I4729 Other ventricular tachycardia: Secondary | ICD-10-CM

## 2021-04-05 DIAGNOSIS — R6889 Other general symptoms and signs: Secondary | ICD-10-CM

## 2021-04-05 DIAGNOSIS — R000 Tachycardia, unspecified: Secondary | ICD-10-CM

## 2021-04-05 DIAGNOSIS — I472 Ventricular tachycardia: Secondary | ICD-10-CM

## 2021-04-05 MED ORDER — METOPROLOL SUCCINATE ER 25 MG PO TB24: 12.5000 mg | ORAL_TABLET | Freq: Every day | ORAL | 3 refills | Status: DC

## 2021-04-05 NOTE — Patient Instructions (Signed)
Medication Instructions:  Begin metoprolol succinate 12.5mg  (half of 25mg  tablet) daily.   *If you need a refill on your cardiac medications before your next appointment, please call your pharmacy*   Lab Work: None ordered  If you have labs (blood work) drawn today and your tests are completely normal, you will receive your results only by: Marland Kitchen MyChart Message (if you have MyChart) OR . A paper copy in the mail If you have any lab test that is abnormal or we need to change your treatment, we will call you to review the results.   Testing/Procedures: None ordered.   Follow-Up: At [facility], you and your health needs are our priority.  As part of our continuing mission to provide you with exceptional heart care, we have created designated Provider Care Teams.  These Care Teams include your primary Cardiologist (physician) and Advanced Practice Providers (APPs -  Physician Assistants and Nurse Practitioners) who all work together to provide you with the care you need, when you need it.  We recommend signing up for the patient portal called "MyChart".  Sign up information is provided on this After Visit Summary.  MyChart is used to connect with patients for Virtual Visits (Telemedicine).  Patients are able to view lab/test results, encounter notes, upcoming appointments, etc.  Non-urgent messages can be sent to your provider as well.   To learn more about what you can do with MyChart, go to [email or web address].    Your next appointment:   3 month(s)  The format for your next appointment:   In Person  Provider:   [name], MD

## 2021-04-05 NOTE — Progress Notes (Signed)
Cardiology Office Note    Date:  04/12/2021   ID:  Caroline [name], DOB [birth year], MRN [id number]  PCP:  [name], MD  Cardiologist:  [name], MD   15 month F/U cardiology evaluation initially referred by [name] for evaluation of episodic dizziness.  History of Present Illness:  Caroline [name] is a 30 y.o. female was followed by Dr. [name].  Over the past 6 months, she has had several episodes where she gets dizzy with difficulty with balance and gait, heat intolerance and fatigability.  In June, she states that she was bitten by some type of bug which ultimately resulted in significant rash and required several weeks of antibiotic therapy.  Subsequently, she helped significant heat intolerance she admits to being constantly cold.  Thyroid function studies have been normal.  Due to ongoing recurrent symptomatology she ultimately was referred for neurologic evaluation and saw Dr. [name].  Patient has a remote history of seizures with her last in 2007 described as twitching starting in her right foot and then feeling a sensation that her body is locking up.  During [name] evaluation he stated that the patient had multiple neurologic complaints including dizziness with out prior neurologic etiology and had been seen by multiple doctors.  An MRI had shown stable incidental pineal cyst and area of stable incidental T12 hyperintensity which was a small 2 mm right frontal periventricular focus of nonspecific gliosis.  After her most recent evaluation with [name], the feeling was most likely her dizziness was related to possible vasovagal versus orthostatic hypotension.  Her neurologic exam was normal.  She had a normal CBC, B12, Lyme, [place] spotted fever, CK, ANA, CMP, and TSH levels.  When she gets dizziness she at times has a sensation of tunnel vision.  She is unaware of any significant arrhythmia prior to her events.   When I initially saw her on January 12, 2020 I reviewed her echo Doppler study from December 03, 2019 which showed EF of 60 to 65% with normal wall motion. A Zio patch monitor  showed predominant sinus rhythm with an average rate of 86 bpm.  The slowest heart rate was sinus bradycardia while sleeping and the fastest heart rate was sinus tachycardia at 173 bpm when she was at work. There was a 6 beat episode of NSVT at a rate of 135 which was completely asymptomatic.  During my initial evaluation her blood pressure was stable but on the low side.  I discussed giving her a prescription for metoprolol 25 mg on an as-needed basis.  Over the past several months, she continues to notice her heart rate may speed up to the 130s.  She was recently evaluated by her OB/GYN physician.  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  She denies any presyncope or syncope.  She admits to getting short of breath with minimal activity her heart rate can speed up.  She underwent laboratory on April 11 and hemoglobin was 13.3, hematocrit 39.6, MCV 87 and platelets 214.  Glucose was 82.  BUN 8 creatinine 0.7.  T4 1.3, TSH 3.6.  She presents for follow-up evaluation.  Past Medical History:  Diagnosis Date  . Colonic inertia 2007   Dr [name] ([facility])  . Dysplastic nevus 11/19/2019   Left upper abdomen. Mild atypia.  Marland Kitchen History of chicken pox   . Influenza B 11/19/2018  . Seasonal allergic rhinitis     Past Surgical History:  Procedure Laterality Date  . COLONOSCOPY  2010   WNL [name])  . EXCISION MORTON'S NEUROMA Left 2007  . WISDOM TOOTH EXTRACTION  2010    Current Medications: Outpatient Medications Prior to Visit  Medication Sig Dispense Refill  . ACZONE 7.5 % GEL Apply a small amount to affected area every morning    . cetirizine (ZYRTEC) 10 MG tablet Take 10 mg by mouth daily as needed for allergies.    Marland Kitchen LORazepam (ATIVAN) 0.5 MG tablet Take 0.5-1 tablets (0.25-0.5 mg total) by mouth at bedtime as needed for anxiety or sleep. 20  tablet 0  . Multiple Vitamins-Calcium (ONE-A-DAY WOMENS FORMULA PO) Take 1 tablet by mouth daily.     . polyethylene glycol (MIRALAX / GLYCOLAX) packet Take 17 g by mouth daily. As needed    . triamcinolone (NASACORT) 55 MCG/ACT AERO nasal inhaler Place 2 sprays into the nose daily. 1 Inhaler   . Vitamin D, Cholecalciferol, 10 MCG (400 UNIT) CAPS Take 1 capsule by mouth daily. 150 capsule   . amoxicillin (AMOXIL) 500 MG capsule Take 1 capsule (500 mg total) by mouth 2 (two) times daily. 10 capsule 0   No facility-administered medications prior to visit.     Allergies:   Amoxicillin, Doxycycline, and Linzess [linaclotide]   Social History   Socioeconomic History  . Marital status: Single    Spouse name: Not on file  . Number of children: 0  . Years of education: 76  . Highest education level: Bachelor's degree (e.g., BA, AB, BS)  Occupational History  . Not on file  Tobacco Use  . Smoking status: Never Smoker  . Smokeless tobacco: Never Used  Substance and Sexual Activity  . Alcohol use: Yes    Comment: occasionally  . Drug use: No  . Sexual activity: Not on file  Other Topics Concern  . Not on file  Social History Narrative   Caffeine use: 1 cup coffee/day and some tea   Lives at home with mother, father, and sister and pets (horses, ducks, Sales promotion account executive, fish, wild [place])   Occ: Neurosurgeon, also student   Edu: bachelor's biochem [employer] and studying Health visitor   Activity: no regular exercise   Diet: good water, fruits/vegetables daily      Social Determinants of Health   Financial Resource Strain: Not on file  Food Insecurity: Not on file  Transportation Needs: Not on file  Physical Activity: Not on file  Stress: Not on file  Social Connections: Not on file   Social history is notable in that she is single and lives at home.  She received a bachelor's degree at [employer] in biochemistry.  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  There is no history of tobacco use.  She does not exercise.  Family History:  The patient's family history includes Cancer in her paternal grandmother; Dementia in her maternal grandmother; Heart failure in her maternal grandmother; Hypertension in her father, maternal grandmother, and paternal grandmother; Hypothyroidism in her mother; Stroke in her maternal grandmother.   Her mother is living at age 15 but has had a history of stroke, father is living at age 52 and has mild CHF, COPD, atrial fibrillation and CAD.  She has 1 sister.  ROS General: Negative; No fevers, chills, or night sweats;  HEENT: Negative; No changes in vision or hearing, sinus congestion, difficulty swallowing Pulmonary: Negative; No cough, wheezing, shortness of breath, hemoptysis Cardiovascular: Increased heart  rates GI: History of "colonic inertia." GU: Negative; No dysuria, hematuria, or difficulty voiding Musculoskeletal: Negative; no myalgias, joint pain, or weakness Hematologic/Oncology: Negative; no easy bruising, bleeding Endocrine: Negative; no heat/cold intolerance; no diabetes Neuro: dizziness with tunnel vision Skin: Recent bug bite with significant rash and streaking of her arms in June 2020 Psychiatric: Negative; No behavioral problems, depression Sleep: Negative; No snoring, daytime sleepiness, hypersomnolence, bruxism, restless legs, hypnogognic hallucinations, no cataplexy Other comprehensive 14 point system review is negative.   PHYSICAL EXAM:   VS:  BP 110/68 (BP Location: Left Arm, Patient Position: Sitting, Cuff Size: Normal)   Pulse 92   Ht '5\' 7"'  (1.702 m)   Wt 153 lb 6.4 oz (69.6 kg)   BMI 24.03 kg/m     Repeat blood pressure by me was 110/70 without orthostatic change  Wt Readings from Last 3 Encounters:  04/11/21 153 lb (69.4 kg)  04/05/21 153 lb 6.4 oz (69.6 kg)  01/06/21 151 lb (68.5 kg)    General: Alert, oriented, no distress.  Skin: normal turgor,  no rashes, warm and dry HEENT: Normocephalic, atraumatic. Pupils equal round and reactive to light; sclera anicteric; extraocular muscles intact; Nose without nasal septal hypertrophy Mouth/Parynx benign; Mallinpatti scale 2 Neck: No JVD, no carotid bruits; normal carotid upstroke Lungs: clear to ausculatation and percussion; no wheezing or rales Chest wall: without tenderness to palpitation Heart: PMI not displaced, RRR, s1 s2 normal, 1/6 systolic murmur, no diastolic murmur, no rubs, gallops, thrills, or heaves Abdomen: soft, nontender; no hepatosplenomehaly, BS+; abdominal aorta nontender and not dilated by palpation. Back: no CVA tenderness Pulses 2+ Musculoskeletal: full range of motion, normal strength, no joint deformities Extremities: no clubbing cyanosis or edema, Homan's sign negative  Neurologic: grossly nonfocal; Cranial nerves grossly wnl Psychologic: Normal mood and affect  Studies/Labs Reviewed:   EKG:  EKG is ordered today.  ECG (independently read by me): NSR at 92; no ectopy, normal intervals  January 12, 2020 ECG (independently read by me): Normal sinus rhythm with mild sinus arrhythmia at 85 bpm.  PR interval 114 ms, QTc interval 445 ms  November 11, 2019 ECG (independently read by me): Normal sinus rhythm at 87 bpm.  No ectopy.  No significant ST-T changes.  Normal intervals with a PR segment at 116 ms and a QTC interval at 454 ms; no ectopy.  Recent Labs: BMP Latest Ref Rng & Units 10/01/2020 12/05/2019 06/23/2019  Glucose 65 - 99 mg/dL 71 89 87  BUN 6 - 20 mg/dL '9 8 8  ' Creatinine 0.57 - 1.00 mg/dL 0.87 0.72 0.78  BUN/Creat Ratio 9 - '23 10 11 10  ' Sodium 134 - 144 mmol/L 134 141 142  Potassium 3.5 - 5.2 mmol/L 3.9 4.0 4.2  Chloride 96 - 106 mmol/L 100 105 104  CO2 20 - 29 mmol/L '23 23 23  ' Calcium 8.7 - 10.2 mg/dL 9.7 9.4 9.5     Hepatic Function Latest Ref Rng & Units 12/05/2019 06/23/2019 12/08/2015  Total Protein 6.0 - 8.5 g/dL 6.6 6.7 6.6  Albumin 3.9 -  5.0 g/dL 4.4 4.7 4.6  AST 0 - 40 IU/L '15 14 15  ' ALT 0 - 32 IU/L '4 5 6  ' Alk Phosphatase 39 - 117 IU/L 71 59 57  Total Bilirubin 0.0 - 1.2 mg/dL 0.4 0.3 0.5    CBC Latest Ref Rng & Units 10/01/2020 12/05/2019 08/06/2019  WBC 3.4 - 10.8 x10E3/uL 8.2 6.1 6.9  Hemoglobin 11.1 - 15.9 g/dL 12.8 12.2 13.0  Hematocrit  34.0 - 46.6 % 39.7 36.7 39.1  Platelets 150 - 450 x10E3/uL 284 237 251   Lab Results  Component Value Date   MCV 86 10/01/2020   MCV 85 12/05/2019   MCV 86 08/06/2019   Lab Results  Component Value Date   TSH 1.710 10/01/2020   Lab Results  Component Value Date   HGBA1C 5.5 12/08/2015     BNP No results found for: BNP  ProBNP No results found for: PROBNP   Lipid Panel     Component Value Date/Time   CHOL 154 12/05/2019 1118   TRIG 67 12/05/2019 1118   HDL 71 12/05/2019 1118   CHOLHDL 2.2 12/05/2019 1118   LDLCALC 70 12/05/2019 1118   LABVLDL 13 12/05/2019 1118     RADIOLOGY: No results found.   Additional studies/ records that were reviewed today include:  I reviewed the records of Dr. [name] as well as Dr. [name]  ECHO 12/03/2019 IMPRESSIONS  1. Left ventricular ejection fraction, by visual estimation, is 60 to 65%. The left ventricle has normal function. There is no left ventricular hypertrophy.  2. The left ventricle has no regional wall motion abnormalities.  3. Global right ventricle has normal systolic function.The right ventricular size is normal. No increase in right ventricular wall thickness.  4. Left atrial size was normal.  5. Right atrial size was normal.  6. The mitral valve is normal in structure. Trivial mitral valve regurgitation. No evidence of mitral stenosis.  7. The tricuspid valve is normal in structure. Tricuspid valve regurgitation is trivial.  8. The aortic valve is tricuspid. Aortic valve regurgitation is not visualized. No evidence of aortic valve sclerosis or stenosis.  9. The pulmonic valve was normal in structure.  Pulmonic valve regurgitation is trivial. 10. Normal pulmonary artery systolic pressure. 11. The inferior vena cava is normal in size with greater than 50% respiratory variability, suggesting right atrial pressure of 3 mmHg.   ZIO patch 14 day monitorStudy Highlights  The patient was monitored with a Zio patch for 14 days from December 05, 2019 through December 19, 2019.  The predominant rhythm was sinus rhythm with an average heart rate at 86 bpm.  The slowest heart rate was sinus bradycardia which occurred at 4:52 AM while she was sleeping on December 23.  The fastest heart rate was sinus tachycardia at 173 bpm when the patient was at work at 9:27 AM on December 18.  The patient had 1 run of nonsustained ventricular tachycardia lasting 6 beats with an average rate at 113 bpm and maximum 135.  There were very rare isolated PACs less than 1% and very rare atrial couplets less than 1%.  There were very rare isolated PVCs and couplets less than 1%.  There were no pauses.  There was no evidence for atrial fibrillation.     ASSESSMENT:    1. Tachycardia   2. NSVT (nonsustained ventricular tachycardia) ([place]): 6 beats, average 113 bpm   3. Vasovagal near-syncope   4. Physical deconditioning   5. Colonic inertia   6. Orthostatic dizziness   7. Heat intolerance     PLAN:  Caroline [name] is a very pleasant 30 year-old female who has a longstanding history of orthostatic dizziness but states that since having an insect bite, her symptoms have worsened.  She had undergone comprehensive neurologic assessment and an MRI has shown stable incidental pineal cyst and a small right frontal periventricular focus of nonspecific gliosis.  She states that she has become  intolerant to heat and is constantly cold.  She is felt to have possible vasovagal syncope versus orthostatic hypotension.  When I initially saw her,  laboratory including CBC, CMP, thyroid function studies laboratory for Lyme disease, [place]  spotted fever, ANA and CPK of all been normal.  On her initial evaluation with me in November 2020 her blood pressure was low with minimal orthostasis and supine blood pressure dropping from 104 to 94 going from supine to standing position.  At that time I recommended support stockings.  Her echo Doppler study has demonstrated normal systolic and diastolic function.  She has normal wall motion and normal valvular architecture.  There is no evidence for pulmonary hypertension.  She had worn a 14-day cardiac monitor which showed predominant sinus rhythm with an average rate at 86 bpm.  Her slowest heart rate occurred while sleeping and was sinus bradycardia at 51.  Her fastest heart rate was sinus tachycardia up to 173 bpm which occurred while she was at work.  There was one episode of nonsustained ventricular tachycardia lasting 6 beats at an average rate of 135 bpm for which she was completely asymptomatic. She  has had issues with equilibrium and also has issues with  "colonic inertia" "and at times had gone almost days without a bowel movement.  She has seen several GI physicians including Dr. Carlis Abbott, Dr. [name] and Dr. [name].  As only, she is doing fairly well.  However she continues to notice palpitations and her heart rates at times increased to the 130s.  She also notes some occasional muscle trips twitches, Teague, exhaustion, and admits that she gets short of breath with activity.  I reviewed recent laboratory that she had completed on from April 04, 2021.  Her ECG remained stable.  I suspect a lot of her increased heart rate with decreased activity may be contributed by cardiovascular deconditioning.  She has been documented to have normal systolic and diastolic function.  She denies any exertionally precipitated chest tightness.  I have recommended she try metoprolol succinate 12.5 mg on a daily basis to see if this can reduce some of her symptomatology.  I had remotely seen her, I had had previously  suggested possible as needed metoprolol tartrate which she never had filled.  She continues to take lorazepam for insomnia issues.  I will see her in 3 months for follow-up evaluation    Medication Adjustments/Labs and Tests Ordered: Current medicines are reviewed at length with the patient today.  Concerns regarding medicines are outlined above.  Medication changes, Labs and Tests ordered today are listed in the Patient Instructions below. Patient Instructions  Medication Instructions:  Begin metoprolol succinate 12.49m (half of 2100mtablet) daily.   *If you need a refill on your cardiac medications before your next appointment, please call your pharmacy*   Lab Work: None ordered  If you have labs (blood work) drawn today and your tests are completely normal, you will receive your results only by: . Marland KitchenyChart Message (if you have MyChart) OR . A paper copy in the mail If you have any lab test that is abnormal or we need to change your treatment, we will call you to review the results.   Testing/Procedures: None ordered.   Follow-Up: [facility].  As part of our continuing mission to provide you with exceptional heart care, we have created designated Provider Care Teams.  These Care Teams include your primary Cardiologist (physician) and  Advanced Practice Providers (APPs -  Physician Assistants and Nurse Practitioners) who all work together to provide you with the care you need, when you need it.  We recommend signing up for the patient portal called "MyChart".  Sign up information is provided on this After Visit Summary.  MyChart is used to connect with patients for Virtual Visits (Telemedicine).  Patients are able to view lab/test results, encounter notes, upcoming appointments, etc.  Non-urgent messages can be sent to your provider as well.   To learn more about what you can do with MyChart, go to [email or web address].    Your next  appointment:   3 month(s)  The format for your next appointment:   In Person  Provider:   [name], MD        Signed, [name], MD  04/12/2021 12:54 PM    [facility] [address], [place], [place], [place]  [address] Phone: [phone]

## 2021-04-07 DIAGNOSIS — Z20822 Contact with and (suspected) exposure to covid-19: Secondary | ICD-10-CM | POA: Diagnosis not present

## 2021-04-09 DIAGNOSIS — J069 Acute upper respiratory infection, unspecified: Secondary | ICD-10-CM | POA: Diagnosis not present

## 2021-04-11 ENCOUNTER — Other Ambulatory Visit: Payer: Self-pay

## 2021-04-11 ENCOUNTER — Telehealth (INDEPENDENT_AMBULATORY_CARE_PROVIDER_SITE_OTHER): Payer: BC Managed Care – PPO | Admitting: Primary Care

## 2021-04-11 ENCOUNTER — Encounter: Payer: Self-pay | Admitting: Primary Care

## 2021-04-11 VITALS — Temp 97.8°F | Ht 67.0 in | Wt 153.0 lb

## 2021-04-11 DIAGNOSIS — J019 Acute sinusitis, unspecified: Secondary | ICD-10-CM | POA: Diagnosis not present

## 2021-04-11 MED ORDER — AMOXICILLIN 500 MG PO CAPS: 500.0000 mg | ORAL_CAPSULE | Freq: Three times a day (TID) | ORAL | 0 refills | Status: DC

## 2021-04-11 NOTE — Patient Instructions (Signed)
Start amoxil 500 mg, take 1 capsule by mouth three times daily for 7 days.  Stop taking Sudafed. Continue Nasacort, Zyrtec.  Try sinus rinses.   It was a pleasure meeting you!

## 2021-04-11 NOTE — Progress Notes (Signed)
Patient ID: Caroline [name], female    DOB: 1991/03/26, 30 y.o.   MRN: [id number]  Virtual visit completed through [place], a video enabled telemedicine application. Due to national recommendations of social distancing due to COVID-19, a virtual visit is felt to be most appropriate for this patient at this time. Reviewed limitations, risks, security and privacy concerns of performing a virtual visit and the availability of in person appointments. I also reviewed that there may be a patient responsible charge related to this service. The patient agreed to proceed.   Patient location: home Provider location: [place] at [facility], office Persons participating in this virtual visit: patient, provider   If any vitals were documented, they were collected by patient at home unless specified below.    Temp 97.8 F (36.6 C) (Temporal)   Ht 5\' 7"  (1.702 m)   Wt 153 lb (69.4 kg)   BMI 23.96 kg/m    CC: Sinus pressure Subjective:   HPI: Caroline [name] is a 30 y.o. female patient of Dr. [name] with a history of sinusitis, seasonal allergies presenting on 04/11/2021 with a chief complaint of sinus pressure  She also reports sore throat, otalgia, sinus pressure, fatigue, voice hoarseness, coughing spells, body aches, decrease in appetite, decreased taste. She tested negative for Covid-19 two days ago. Symptoms began two weeks ago, worse over the last week.   She denies fevers. Her cough is now productive with green/yellow sputum. She's expelling yellow mucous from her nasal cavity. Today she's feeling about the same. She's been exposed to her father who had pneumonia two weeks ago.   She's been taking Mucinex, Sudafed, Nasacort, Zyrtec, saline nasal spray without much improvement. Symptoms feel similar to her typical sinus infections. She cannot tolerate Augmentin, can tolerate smaller doses of Amoxil. Never took the Amoxil that was prescribed in February 2022.      Relevant past medical,  surgical, family and social history reviewed and updated as indicated. Interim medical history since our last visit reviewed. Allergies and medications reviewed and updated. Outpatient Medications Prior to Visit  Medication Sig Dispense Refill  . ACZONE 7.5 % GEL Apply a small amount to affected area every morning    . cetirizine (ZYRTEC) 10 MG tablet Take 10 mg by mouth daily as needed for allergies.    Marland Kitchen LORazepam (ATIVAN) 0.5 MG tablet Take 0.5-1 tablets (0.25-0.5 mg total) by mouth at bedtime as needed for anxiety or sleep. 20 tablet 0  . Multiple Vitamins-Calcium (ONE-A-DAY WOMENS FORMULA PO) Take 1 tablet by mouth daily.     . polyethylene glycol (MIRALAX / GLYCOLAX) packet Take 17 g by mouth daily. As needed    . triamcinolone (NASACORT) 55 MCG/ACT AERO nasal inhaler Place 2 sprays into the nose daily. 1 Inhaler   . Vitamin D, Cholecalciferol, 10 MCG (400 UNIT) CAPS Take 1 capsule by mouth daily. 150 capsule   . metoprolol succinate (TOPROL XL) 25 MG 24 hr tablet Take 0.5 tablets (12.5 mg total) by mouth daily. (Patient not taking: Reported on 04/11/2021) 45 tablet 3   No facility-administered medications prior to visit.     Per HPI unless specifically indicated in ROS section below Review of Systems  Constitutional: Positive for fatigue. Negative for fever.  HENT: Positive for ear pain, postnasal drip, sinus pressure and sinus pain.   Respiratory: Positive for cough.   Musculoskeletal: Positive for myalgias.  Allergic/Immunologic: Positive for environmental allergies.   Objective:  Temp 97.8 F (36.6 C) (Temporal)  Ht 5\' 7"  (1.702 m)   Wt 153 lb (69.4 kg)   BMI 23.96 kg/m   Wt Readings from Last 3 Encounters:  04/11/21 153 lb (69.4 kg)  04/05/21 153 lb 6.4 oz (69.6 kg)  01/06/21 151 lb (68.5 kg)       Physical exam: Gen: alert, NAD, not ill appearing Pulm: speaks in complete sentences without increased work of breathing Psych: normal mood, normal thought content       Results for orders placed or performed in visit on 11/01/20  Microscopic Examination  Result Value Ref Range   WBC, UA 0-5 0 - 5 /hpf   RBC 3-10 (A) 0 - 2 /hpf   Epithelial Cells (non renal) 0-10 0 - 10 /hpf   Casts None seen None seen /lpf   Crystals Present (A) N/A   Crystal Type Amorphous Sediment N/A   Bacteria, UA Few None seen/Few  Urine Culture  Result Value Ref Range   Urine Culture, Routine Final report    Organism ID, Bacteria Comment   Urinalysis, Routine w reflex microscopic  Result Value Ref Range   Specific Gravity, UA 1.018 1.005 - 1.030   pH, UA 7.0 5.0 - 7.5   Color, UA Yellow Yellow   Appearance Ur Turbid (A) Clear   Leukocytes,UA 2+ (A) Negative   Protein,UA Negative Negative/Trace   Glucose, UA Negative Negative   Ketones, UA Negative Negative   RBC, UA Negative Negative   Bilirubin, UA Negative Negative   Urobilinogen, Ur 0.2 0.2 - 1.0 mg/dL   Nitrite, UA Negative Negative   Microscopic Examination See below:   Specimen status report  Result Value Ref Range   specimen status report Comment    Assessment & Plan:   Problem List Items Addressed This Visit   None   Visit Diagnoses    Acute sinusitis, recurrence not specified, unspecified location    -  Primary   Relevant Medications   amoxicillin (AMOXIL) 500 MG capsule       Meds ordered this encounter  Medications  . amoxicillin (AMOXIL) 500 MG capsule    Sig: Take 1 capsule (500 mg total) by mouth 3 (three) times daily.    Dispense:  21 capsule    Refill:  0    Order Specific Question:   Supervising Provider    Answer:   BEDSOLE, AMY E [[id number]]   No orders of the defined types were placed in this encounter.   I discussed the assessment and treatment plan with the patient. The patient was provided an opportunity to ask questions and all were answered. The patient agreed with the plan and demonstrated an understanding of the instructions. The patient was advised to call back or seek an  in-person evaluation if the symptoms worsen or if the condition fails to improve as anticipated.  Follow up plan:  Start amoxil 500 mg, take 1 capsule by mouth three times daily for 7 days.  Stop taking Sudafed. Continue Nasacort, Zyrtec.  Try sinus rinses.   It was a pleasure meeting you!   No follow-ups on file.  Pleas Koch, NP

## 2021-04-11 NOTE — Assessment & Plan Note (Signed)
Acute symptoms, could still be viral, discussed this today.  Given duration of symptoms without improvement, will treat. She can tolerate (and does well) with Amoxil 500 mg TID. Rx sent in for 7 day course.   Discussed to stop Sudafed. Continue Nasacort and Zyrtec. Start sinus rinses.   Follow up PRN.

## 2021-04-12 ENCOUNTER — Encounter: Payer: Self-pay | Admitting: Cardiovascular Disease

## 2021-04-20 DIAGNOSIS — M542 Cervicalgia: Secondary | ICD-10-CM | POA: Diagnosis not present

## 2021-04-20 DIAGNOSIS — M7918 Myalgia, other site: Secondary | ICD-10-CM | POA: Diagnosis not present

## 2021-04-20 DIAGNOSIS — M9902 Segmental and somatic dysfunction of thoracic region: Secondary | ICD-10-CM | POA: Diagnosis not present

## 2021-04-20 DIAGNOSIS — M9901 Segmental and somatic dysfunction of cervical region: Secondary | ICD-10-CM | POA: Diagnosis not present

## 2021-05-05 ENCOUNTER — Telehealth: Payer: Self-pay | Admitting: Cardiovascular Disease

## 2021-05-05 ENCOUNTER — Encounter: Payer: Self-pay | Admitting: Internal Medicine

## 2021-05-05 NOTE — Telephone Encounter (Signed)
Entered in error

## 2021-05-10 ENCOUNTER — Ambulatory Visit: Payer: BC Managed Care – PPO | Admitting: Cardiovascular Disease

## 2021-05-11 ENCOUNTER — Other Ambulatory Visit: Payer: Self-pay

## 2021-05-11 ENCOUNTER — Encounter: Payer: Self-pay | Admitting: Family Medicine

## 2021-05-11 ENCOUNTER — Ambulatory Visit: Payer: BC Managed Care – PPO | Admitting: Family Medicine

## 2021-05-11 VITALS — BP 106/68 | HR 109 | Temp 98.3°F | Ht 67.0 in | Wt 152.1 lb

## 2021-05-11 DIAGNOSIS — R42 Dizziness and giddiness: Secondary | ICD-10-CM | POA: Diagnosis not present

## 2021-05-11 DIAGNOSIS — G4700 Insomnia, unspecified: Secondary | ICD-10-CM | POA: Diagnosis not present

## 2021-05-11 DIAGNOSIS — R000 Tachycardia, unspecified: Secondary | ICD-10-CM | POA: Diagnosis not present

## 2021-05-11 MED ORDER — METOPROLOL TARTRATE 25 MG PO TABS: 12.5000 mg | ORAL_TABLET | Freq: Two times a day (BID) | ORAL | 0 refills | Status: DC | PRN

## 2021-05-11 MED ORDER — LORAZEPAM 0.5 MG PO TABS: 0.2500 mg | ORAL_TABLET | Freq: Every evening | ORAL | 0 refills | Status: DC | PRN

## 2021-05-11 NOTE — Progress Notes (Addendum)
Patient ID: Caroline [name], female    DOB: 10/21/1991, 30 y.o.   MRN: [id number]  This visit was conducted in person.  BP 106/68   Pulse (!) 109   Temp 98.3 F (36.8 C) (Temporal)   Ht 5\' 7"  (1.702 m)   Wt 152 lb 2 oz (69 kg)   SpO2 98%   BMI 23.83 kg/m    Orthostatic VS for the past 24 hrs (Last 3 readings):  BP- Lying Pulse- Lying BP- Standing at 3 minutes Pulse- Standing at 3 minutes  05/11/21 1159 -- -- 116/90 115  05/11/21 1156 116/82 81 -- --    CC: elevated heart rate  Subjective:   HPI: Caroline [name] is a 30 y.o. female presenting on 05/11/2021 for Elevated Heart Rate (C/o elevated HR and fluctuates. )   See prior note for details.  Ongoing episodes of tachycardia up to 160, can be sustained to 130s, feels better when laying down and with cold. When she wakes up in the morning HR consistently 50-60s. Notes wide fluctuations in heart rate which causes generalized malaise feeling. Endorses overheating symptoms including fingers/toes turning red after walking.   Brings list of current symptoms: Emotional/irritable/cramps  Heat intolerance especially when walking  Twitches in limbs Tachycardia  Fatigue Insomnia - lorazepam helps sleep  Trouble walking fast  Blacking out feelings  Short of breath and heart racing climbing stairs   Drinking plenty of water, limiting caffeine to 1 cup/day.   Saw cardiology Dr Claiborne Billings last month for ongoing tachycardia, NSVT, vasovagal near-syncope and orthostatic dizziness. Note reviewed - recommended daily metoprolol XL 12.5mg  - hasn't started this yet. She has been concerned about side effects of medication given wide fluctuations in her heart rate as well as her mother did not tolerate this medicine. She has previously completed reassuring neurologic and cardiac evaluations including echocardiogram, 14d cardiac monitor.   Seen recently for acute sinusitis managed with low dose amoxicillin which she tolerates well. She also notes  symptoms overall worse this year with worsening pollen.   Just started birth control - Lo Loestrin Fe 1 tablet daily started 04/09/2021. Having ongoing spotting since she started pills. Saw Dr Langley Gauss at [facility]. Monitoring new ovarian cyst.  LMP 2 wks ago.   Planning to return to GI - known colonic inertia.      Relevant past medical, surgical, family and social history reviewed and updated as indicated. Interim medical history since our last visit reviewed. Allergies and medications reviewed and updated. Outpatient Medications Prior to Visit  Medication Sig Dispense Refill  . ACZONE 7.5 % GEL Apply a small amount to affected area every morning    . cetirizine (ZYRTEC) 10 MG tablet Take 10 mg by mouth daily as needed for allergies.    . Multiple Vitamins-Calcium (ONE-A-DAY WOMENS FORMULA PO) Take 1 tablet by mouth daily.     . Norethindrone-Ethinyl Estradiol-Fe Biphas (LO LOESTRIN FE) 1 MG-10 MCG / 10 MCG tablet Take 1 tablet by mouth daily.    . polyethylene glycol (MIRALAX / GLYCOLAX) packet Take 17 g by mouth daily. As needed    . triamcinolone (NASACORT) 55 MCG/ACT AERO nasal inhaler Place 2 sprays into the nose daily. 1 Inhaler   . Vitamin D, Cholecalciferol, 10 MCG (400 UNIT) CAPS Take 1 capsule by mouth daily. 150 capsule   . LORazepam (ATIVAN) 0.5 MG tablet Take 0.5-1 tablets (0.25-0.5 mg total) by mouth at bedtime as needed for anxiety or sleep. 20 tablet  0  . amoxicillin (AMOXIL) 500 MG capsule Take 1 capsule (500 mg total) by mouth 3 (three) times daily. 21 capsule 0  . metoprolol succinate (TOPROL XL) 25 MG 24 hr tablet Take 0.5 tablets (12.5 mg total) by mouth daily. (Patient not taking: No sig reported) 45 tablet 3   No facility-administered medications prior to visit.     Per HPI unless specifically indicated in ROS section below Review of Systems Objective:  BP 106/68   Pulse (!) 109   Temp 98.3 F (36.8 C) (Temporal)   Ht 5\' 7"  (1.702 m)   Wt 152 lb 2  oz (69 kg)   SpO2 98%   BMI 23.83 kg/m   Wt Readings from Last 3 Encounters:  05/11/21 152 lb 2 oz (69 kg)  04/11/21 153 lb (69.4 kg)  04/05/21 153 lb 6.4 oz (69.6 kg)      Physical Exam Vitals and nursing note reviewed.  Constitutional:      Appearance: Normal appearance. She is not ill-appearing.  Eyes:     Extraocular Movements: Extraocular movements intact.     Pupils: Pupils are equal, round, and reactive to light.  Neck:     Thyroid: No thyroid mass or thyromegaly.  Cardiovascular:     Rate and Rhythm: Normal rate and regular rhythm.     Pulses: Normal pulses.     Heart sounds: Normal heart sounds. No murmur heard.   Pulmonary:     Effort: Pulmonary effort is normal. No respiratory distress.     Breath sounds: Normal breath sounds. No wheezing, rhonchi or rales.  Musculoskeletal:     Cervical back: Normal range of motion and neck supple.     Right lower leg: No edema.     Left lower leg: No edema.  Lymphadenopathy:     Cervical: No cervical adenopathy.  Skin:    General: Skin is warm and dry.     Findings: No rash.  Neurological:     Mental Status: She is alert.  Psychiatric:        Mood and Affect: Mood normal.        Behavior: Behavior normal.        Lab Results  Component Value Date   TSH 1.710 10/01/2020    Assessment & Plan:  This visit occurred during the SARS-CoV-2 public health emergency.  Safety protocols were in place, including screening questions prior to the visit, additional usage of staff PPE, and extensive cleaning of exam room while observing appropriate contact time as indicated for disinfecting solutions.   Problem List Items Addressed This Visit    Lightheaded   Insomnia    Will continue PRN lorazepam at this time, sparing use. Refilled today (last filled 09/2020). Considered TCA, trazodone, will not change med at this time.       Tachycardia - Primary    Marked symptomatic orthostatic changes in pulse (30+ points) without  significant change in blood pressure - ?POTS vs inappropriate sinus tachycardia vs anxiety.  She remains hesitant to try Toprol XL due to concern over side effects. I did suggest trial of low dose metoprolol tartrate to stabilize pulse fluctuations, monitoring for hypotension or symptomatic bradycardia. She will let me know how she does with this.  Did recommend f/u with cards to further evaluate for potential POTS ?tilt table testing. Did encourage good hydration, adding drinks with electrolytes ie gatorade, continued walking routine.  Consider tryptase r/o mast cell activation/mastocytosis although flushing is not prominent.  Meds ordered this encounter  Medications  . metoprolol tartrate (LOPRESSOR) 25 MG tablet    Sig: Take 0.5-1 tablets (12.5-25 mg total) by mouth 2 (two) times daily as needed (tachycardia).    Dispense:  30 tablet    Refill:  0  . LORazepam (ATIVAN) 0.5 MG tablet    Sig: Take 0.5-1 tablets (0.25-0.5 mg total) by mouth at bedtime as needed for anxiety or sleep.    Dispense:  20 tablet    Refill:  0   No orders of the defined types were placed in this encounter.   Patient Instructions  Continue good hydration.  Consider supplementing with sports drinks with electrolytes (ie gatorade). Continue walking routine.  Try immediate release metoprolol first - sent to pharmacy 1/2 tab at a time and let us know effect of this.  Ok to continue lorazepam as needed.    Follow up plan: Return if symptoms worsen or fail to improve.  [name], MD

## 2021-05-11 NOTE — Patient Instructions (Addendum)
Continue good hydration.  Consider supplementing with sports drinks with electrolytes (ie gatorade). Continue walking routine.  Try immediate release metoprolol first - sent to pharmacy 1/2 tab at a time and let us know effect of this.  Ok to continue lorazepam as needed.

## 2021-05-12 DIAGNOSIS — R000 Tachycardia, unspecified: Secondary | ICD-10-CM | POA: Insufficient documentation

## 2021-05-12 DIAGNOSIS — I479 Paroxysmal tachycardia, unspecified: Secondary | ICD-10-CM

## 2021-05-12 HISTORY — DX: Paroxysmal tachycardia, unspecified: I47.9

## 2021-05-12 NOTE — Assessment & Plan Note (Signed)
Will continue PRN lorazepam at this time, sparing use. Refilled today (last filled 09/2020). Considered TCA, trazodone, will not change med at this time.

## 2021-05-12 NOTE — Assessment & Plan Note (Addendum)
Marked symptomatic orthostatic changes in pulse (30+ points) without significant change in blood pressure - ?POTS vs inappropriate sinus tachycardia vs anxiety.  She remains hesitant to try Toprol XL due to concern over side effects. I did suggest trial of low dose metoprolol tartrate to stabilize pulse fluctuations, monitoring for hypotension or symptomatic bradycardia. She will let me know how she does with this.  Did recommend f/u with cards to further evaluate for potential POTS ?tilt table testing. Did encourage good hydration, adding drinks with electrolytes ie gatorade, continued walking routine.  Consider tryptase r/o mast cell activation/mastocytosis although flushing is not prominent.

## 2021-05-13 ENCOUNTER — Telehealth: Payer: Self-pay | Admitting: Cardiovascular Disease

## 2021-05-13 NOTE — Telephone Encounter (Signed)
Patient c/o Palpitations:  High priority if patient c/o lightheadedness, shortness of breath, or chest pain  1) How long have you had palpitations/irregular HR/ Afib? Are you having the symptoms now? Yes  2) Are you currently experiencing lightheadedness, SOB or CP? CP  3) Do you have a history of afib (atrial fibrillation) or irregular heart rhythm? Yes  4) Have you checked your BP or HR? (document readings if available):NO  5) Are you experiencing any other symptoms? When standing and sitting heart rate is elevated.It is making her feel like her chest is tight.  Pt c/o of Chest Pain: STAT if CP now or developed within 24 hours  1. Are you having CP right now? Yes  2. Are you experiencing any other symptoms (ex. SOB, nausea, vomiting, sweating)? Rapid heart beat  3. How long have you been experiencing CP? Unknown.  4. Is your CP continuous or coming and going? Continous  5. Have you taken Nitroglycerin? Yes ?

## 2021-05-13 NOTE — Telephone Encounter (Signed)
Spoke to patient she stated this past Sunday her heart starting beating fast 185.She felt faint.Stated her heart rate is all over the place goes from 60 to 185.Heart rate increases when she is up moving around.She saw PCP this past Wed 5/25 he changed Metoprolol Succ to Tartrate 25 mg 1/2 to 1 tablet twice a day as needed.Stated she took 1/2 tablet this morning if dropped heart rate down to 66.Stated she is afraid to take anymore her B/P is low.She could not tell me her B/P she needs a new monitor.She has appointment with [facility] 05/19/21 at 9:00 am.Advised to keep appointment and go to ED if her heart rate stays consistently elevated 140 or greater.Advised Dr.Kelly is out of office today.I will send message to him.

## 2021-05-18 NOTE — Telephone Encounter (Signed)
Patient will be seen by me in office tomorrow

## 2021-05-19 ENCOUNTER — Encounter: Payer: Self-pay | Admitting: Cardiovascular Disease

## 2021-05-19 ENCOUNTER — Other Ambulatory Visit: Payer: Self-pay

## 2021-05-19 ENCOUNTER — Ambulatory Visit: Payer: BC Managed Care – PPO | Admitting: Cardiovascular Disease

## 2021-05-19 VITALS — BP 102/70 | HR 86 | Ht 67.0 in | Wt 153.4 lb

## 2021-05-19 DIAGNOSIS — R000 Tachycardia, unspecified: Secondary | ICD-10-CM | POA: Diagnosis not present

## 2021-05-19 DIAGNOSIS — R002 Palpitations: Secondary | ICD-10-CM | POA: Diagnosis not present

## 2021-05-19 DIAGNOSIS — I4729 Other ventricular tachycardia: Secondary | ICD-10-CM

## 2021-05-19 DIAGNOSIS — I472 Ventricular tachycardia: Secondary | ICD-10-CM | POA: Diagnosis not present

## 2021-05-19 DIAGNOSIS — R5381 Other malaise: Secondary | ICD-10-CM

## 2021-05-19 DIAGNOSIS — R6889 Other general symptoms and signs: Secondary | ICD-10-CM

## 2021-05-19 NOTE — Patient Instructions (Signed)
Medication Instructions:  Your Physician recommend you continue on your current medication as directed.    *If you need a refill on your cardiac medications before your next appointment, please call your pharmacy*   Lab Work: None ordered today   Testing/Procedures: None ordered today   Follow-Up: At [facility], you and your health needs are our priority.  As part of our continuing mission to provide you with exceptional heart care, we have created designated Provider Care Teams.  These Care Teams include your primary Cardiologist (physician) and Advanced Practice Providers (APPs -  Physician Assistants and Nurse Practitioners) who all work together to provide you with the care you need, when you need it.  We recommend signing up for the patient portal called "MyChart".  Sign up information is provided on this After Visit Summary.  MyChart is used to connect with patients for Virtual Visits (Telemedicine).  Patients are able to view lab/test results, encounter notes, upcoming appointments, etc.  Non-urgent messages can be sent to your provider as well.   To learn more about what you can do with MyChart, go to [email or web address].    Your next appointment:   6-8 month(s)  The format for your next appointment:   In Person  Provider:   [name], MD

## 2021-05-19 NOTE — Progress Notes (Signed)
Cardiology Office Note    Date:  05/26/2021   ID:  Caroline [name], DOB [birth year], MRN [id number]  PCP:  [name], MD  Cardiologist:  [name], MD   6 week F/U cardiology evaluation initially referred by [name] for evaluation of episodic dizziness.  History of Present Illness:  Caroline [name] is a 30 y.o. female was followed by Dr. [name].  Over the past 6 months, she has had several episodes where she gets dizzy with difficulty with balance and gait, heat intolerance and fatigability.  In June, she states that she was bitten by some type of bug which ultimately resulted in significant rash and required several weeks of antibiotic therapy.  Subsequently, she helped significant heat intolerance she admits to being constantly cold.  Thyroid function studies have been normal.  Due to ongoing recurrent symptomatology she ultimately was referred for neurologic evaluation and saw Dr. [name].  Patient has a remote history of seizures with her last in 2007 described as twitching starting in her right foot and then feeling a sensation that her body is locking up.  During [name] evaluation he stated that the patient had multiple neurologic complaints including dizziness with out prior neurologic etiology and had been seen by multiple doctors.  An MRI had shown stable incidental pineal cyst and area of stable incidental T12 hyperintensity which was a small 2 mm right frontal periventricular focus of nonspecific gliosis.  After her most recent evaluation with [name], the feeling was most likely her dizziness was related to possible vasovagal versus orthostatic hypotension.  Her neurologic exam was normal.  She had a normal CBC, B12, Lyme, [place] spotted fever, CK, ANA, CMP, and TSH levels.  When she gets dizziness she at times has a sensation of tunnel vision.  She is unaware of any significant arrhythmia prior to her events.   When I initially saw her on January 12, 2020 I reviewed her echo Doppler study from December 03, 2019 which showed EF of 60 to 65% with normal wall motion. A Zio patch monitor  showed predominant sinus rhythm with an average rate of 86 bpm.  The slowest heart rate was sinus bradycardia while sleeping and the fastest heart rate was sinus tachycardia at 173 bpm when she was at work. There was a 6 beat episode of NSVT at a rate of 135 which was completely asymptomatic.  During my initial evaluation her blood pressure was stable but on the low side.  I discussed giving her a prescription for metoprolol 25 mg on an as-needed basis.  I last saw her on April 12,2022 at which time she stated she continued to  notice her heart rate may speed up to the 130s.  She was recently evaluated by her OB/GYN physician.  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  She denied any presyncope or syncope.  She admits to getting short of breath with minimal activity her heart rate can speed up.  She underwent laboratory on April 11 and hemoglobin was 13.3, hematocrit 39.6, MCV 87 and platelets 214.  Glucose was 82.  BUN 8 creatinine 0.7.  T4 1.3, TSH 3.6.  During her evaluation, her ECG remained stable.  I was concerned that cardiovascular deconditioning was playing a significant role in her rapid acceleration of heart rate.  I had suggested she may try metoprolol succinate 12.5 mg on a daily basis to see if this can reduce some of her symptomatology.  On her prior evaluation a trial of low-dose metoprolol tartrate on a as needed basis had been recommended.  She never initiated the metoprolol succinate but on 1 occasion did take the tartrate.  Presently, she continues to notice episodes of increased heart rate.  She had recently been started on birth control pills for an ovarian cyst and irregular cycles.  She denies any episodes of chest pain.  She will be establishing with Dr. [name] for GI evaluation.  She reports a history of "colonic inertia "and at times has gone  days without bowel movements.  She presents for reevaluation.  Past Medical History:  Diagnosis Date  . Colonic inertia 2007   Dr [name] ([facility])  . Dysplastic nevus 11/19/2019   Left upper abdomen. Mild atypia.  Marland Kitchen History of chicken pox   . Influenza B 11/19/2018  . Seasonal allergic rhinitis     Past Surgical History:  Procedure Laterality Date  . COLONOSCOPY  2010   WNL [name])  . EXCISION MORTON'S NEUROMA Left 2007  . WISDOM TOOTH EXTRACTION  2010    Current Medications: Outpatient Medications Prior to Visit  Medication Sig Dispense Refill  . ACZONE 7.5 % GEL Apply a small amount to affected area every morning    . cetirizine (ZYRTEC) 10 MG tablet Take 10 mg by mouth daily as needed for allergies.    Marland Kitchen LORazepam (ATIVAN) 0.5 MG tablet Take 0.5-1 tablets (0.25-0.5 mg total) by mouth at bedtime as needed for anxiety or sleep. 20 tablet 0  . metoprolol tartrate (LOPRESSOR) 25 MG tablet Take 0.5-1 tablets (12.5-25 mg total) by mouth 2 (two) times daily as needed (tachycardia). 30 tablet 0  . Multiple Vitamins-Calcium (ONE-A-DAY WOMENS FORMULA PO) Take 1 tablet by mouth daily.     . Norethindrone-Ethinyl Estradiol-Fe Biphas (LO LOESTRIN FE) 1 MG-10 MCG / 10 MCG tablet Take 1 tablet by mouth daily.    . polyethylene glycol (MIRALAX / GLYCOLAX) packet Take 17 g by mouth daily. As needed    . triamcinolone (NASACORT) 55 MCG/ACT AERO nasal inhaler Place 2 sprays into the nose daily. 1 Inhaler   . Vitamin D, Cholecalciferol, 10 MCG (400 UNIT) CAPS Take 1 capsule by mouth daily. 150 capsule    No facility-administered medications prior to visit.     Allergies:   Amoxicillin, Doxycycline, and Linzess [linaclotide]   Social History   Socioeconomic History  . Marital status: Single    Spouse name: Not on file  . Number of children: 0  . Years of education: 38  . Highest education level: Bachelor's degree (e.g., BA, AB, BS)  Occupational History  . Not on file  Tobacco Use   . Smoking status: Never Smoker  . Smokeless tobacco: Never Used  Substance and Sexual Activity  . Alcohol use: Yes    Comment: occasionally  . Drug use: No  . Sexual activity: Not on file  Other Topics Concern  . Not on file  Social History Narrative   Caffeine use: 1 cup coffee/day and some tea   Lives at home with mother, father, and sister and pets (horses, ducks, Sales promotion account executive, fish, wild [place])   Occ: Neurosurgeon, also student   Edu: bachelor's biochem [employer] and studying Health visitor   Activity: no regular exercise   Diet: good water, fruits/vegetables daily      Social Determinants of Health   Financial Resource Strain: Not on file  Food Insecurity: Not on file  Transportation Needs: Not on  file  Physical Activity: Not on file  Stress: Not on file  Social Connections: Not on file   Social history is notable in that she is single and lives at home.  She received a bachelor's degree at [employer] in biochemistry.  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  There is no history of tobacco use.  She does not exercise.  Family History:  The patient's family history includes Cancer in her paternal grandmother; Dementia in her maternal grandmother; Heart failure in her maternal grandmother; Hypertension in her father, maternal grandmother, and paternal grandmother; Hypothyroidism in her mother; Stroke in her maternal grandmother.   Her mother is living at age 84 but has had a history of stroke, father is living at age 28 and has mild CHF, COPD, atrial fibrillation and CAD.  She has 1 sister.  ROS General: Negative; No fevers, chills, or night sweats;  HEENT: Negative; No changes in vision or hearing, sinus congestion, difficulty swallowing Pulmonary: Negative; No cough, wheezing, shortness of breath, hemoptysis Cardiovascular: Increased heart rates GI: History of "colonic inertia." GU: Negative; No dysuria,  hematuria, or difficulty voiding Musculoskeletal: Negative; no myalgias, joint pain, or weakness Hematologic/Oncology: Negative; no easy bruising, bleeding Endocrine: Negative; no heat/cold intolerance; no diabetes Neuro: dizziness with tunnel vision Skin: Recent bug bite with significant rash and streaking of her arms in June 2020 Psychiatric: Negative; No behavioral problems, depression Sleep: Negative; No snoring, daytime sleepiness, hypersomnolence, bruxism, restless legs, hypnogognic hallucinations, no cataplexy Other comprehensive 14 point system review is negative.   PHYSICAL EXAM:   VS:  BP 102/70   Pulse 86   Ht '5\' 7"'  (1.702 m)   Wt 153 lb 6.4 oz (69.6 kg)   SpO2 99%   BMI 24.03 kg/m     Repeat blood pressure by me was 110/76 supine 104/78 sitting and 104/76 standing  Wt Readings from Last 3 Encounters:  05/19/21 153 lb 6.4 oz (69.6 kg)  05/11/21 152 lb 2 oz (69 kg)  04/11/21 153 lb (69.4 kg)    General: Alert, oriented, no distress.  Skin: normal turgor, no rashes, warm and dry HEENT: Normocephalic, atraumatic. Pupils equal round and reactive to light; sclera anicteric; extraocular muscles intact;  Nose without nasal septal hypertrophy Mouth/Parynx benign; Mallinpatti scale 2 Neck: No JVD, no carotid bruits; normal carotid upstroke Lungs: clear to ausculatation and percussion; no wheezing or rales Chest wall: without tenderness to palpitation Heart: PMI not displaced, RRR, s1 s2 normal, 1/6 systolic murmur, no diastolic murmur, no rubs, gallops, thrills, or heaves Abdomen: soft, nontender; no hepatosplenomehaly, BS+; abdominal aorta nontender and not dilated by palpation. Back: no CVA tenderness Pulses 2+ Musculoskeletal: full range of motion, normal strength, no joint deformities Extremities: no clubbing cyanosis or edema, Homan's sign negative  Neurologic: grossly nonfocal; Cranial nerves grossly wnl Psychologic: Normal mood and affect    Studies/Labs  Reviewed:   EKG:  EKG is ordered today.  ECG (independently read by me): NSR at 86, no ectopy, noirmal intervals  April 05, 2021 ECG (independently read by me): NSR at 92; no ectopy, normal intervals  January 12, 2020 ECG (independently read by me): Normal sinus rhythm with mild sinus arrhythmia at 85 bpm.  PR interval 114 ms, QTc interval 445 ms  November 11, 2019 ECG (independently read by me): Normal sinus rhythm at 87 bpm.  No ectopy.  No significant ST-T changes.  Normal intervals with a PR segment at 116 ms and a  QTC interval at 454 ms; no ectopy.  Recent Labs: BMP Latest Ref Rng & Units 10/01/2020 12/05/2019 06/23/2019  Glucose 65 - 99 mg/dL 71 89 87  BUN 6 - 20 mg/dL '9 8 8  ' Creatinine 0.57 - 1.00 mg/dL 0.87 0.72 0.78  BUN/Creat Ratio 9 - '23 10 11 10  ' Sodium 134 - 144 mmol/L 134 141 142  Potassium 3.5 - 5.2 mmol/L 3.9 4.0 4.2  Chloride 96 - 106 mmol/L 100 105 104  CO2 20 - 29 mmol/L '23 23 23  ' Calcium 8.7 - 10.2 mg/dL 9.7 9.4 9.5     Hepatic Function Latest Ref Rng & Units 12/05/2019 06/23/2019 12/08/2015  Total Protein 6.0 - 8.5 g/dL 6.6 6.7 6.6  Albumin 3.9 - 5.0 g/dL 4.4 4.7 4.6  AST 0 - 40 IU/L '15 14 15  ' ALT 0 - 32 IU/L '4 5 6  ' Alk Phosphatase 39 - 117 IU/L 71 59 57  Total Bilirubin 0.0 - 1.2 mg/dL 0.4 0.3 0.5    CBC Latest Ref Rng & Units 10/01/2020 12/05/2019 08/06/2019  WBC 3.4 - 10.8 x10E3/uL 8.2 6.1 6.9  Hemoglobin 11.1 - 15.9 g/dL 12.8 12.2 13.0  Hematocrit 34.0 - 46.6 % 39.7 36.7 39.1  Platelets 150 - 450 x10E3/uL 284 237 251   Lab Results  Component Value Date   MCV 86 10/01/2020   MCV 85 12/05/2019   MCV 86 08/06/2019   Lab Results  Component Value Date   TSH 1.710 10/01/2020   Lab Results  Component Value Date   HGBA1C 5.5 12/08/2015     BNP No results found for: BNP  ProBNP No results found for: PROBNP   Lipid Panel     Component Value Date/Time   CHOL 154 12/05/2019 1118   TRIG 67 12/05/2019 1118   HDL 71 12/05/2019 1118   CHOLHDL  2.2 12/05/2019 1118   LDLCALC 70 12/05/2019 1118   LABVLDL 13 12/05/2019 1118     RADIOLOGY: No results found.   Additional studies/ records that were reviewed today include:  I reviewed the records of Dr. [name] as well as Dr. [name]  ECHO 12/03/2019 IMPRESSIONS  1. Left ventricular ejection fraction, by visual estimation, is 60 to 65%. The left ventricle has normal function. There is no left ventricular hypertrophy.  2. The left ventricle has no regional wall motion abnormalities.  3. Global right ventricle has normal systolic function.The right ventricular size is normal. No increase in right ventricular wall thickness.  4. Left atrial size was normal.  5. Right atrial size was normal.  6. The mitral valve is normal in structure. Trivial mitral valve regurgitation. No evidence of mitral stenosis.  7. The tricuspid valve is normal in structure. Tricuspid valve regurgitation is trivial.  8. The aortic valve is tricuspid. Aortic valve regurgitation is not visualized. No evidence of aortic valve sclerosis or stenosis.  9. The pulmonic valve was normal in structure. Pulmonic valve regurgitation is trivial. 10. Normal pulmonary artery systolic pressure. 11. The inferior vena cava is normal in size with greater than 50% respiratory variability, suggesting right atrial pressure of 3 mmHg.   ZIO patch 14 day monitorStudy Highlights  The patient was monitored with a Zio patch for 14 days from December 05, 2019 through December 19, 2019.  The predominant rhythm was sinus rhythm with an average heart rate at 86 bpm.  The slowest heart rate was sinus bradycardia which occurred at 4:52 AM while she was sleeping on December 23.  The fastest  heart rate was sinus tachycardia at 173 bpm when the patient was at work at 9:27 AM on December 18.  The patient had 1 run of nonsustained ventricular tachycardia lasting 6 beats with an average rate at 113 bpm and maximum 135.  There were very rare isolated  PACs less than 1% and very rare atrial couplets less than 1%.  There were very rare isolated PVCs and couplets less than 1%.  There were no pauses.  There was no evidence for atrial fibrillation.     ASSESSMENT:    1. Palpitation   2. Tachycardia   3. NSVT (nonsustained ventricular tachycardia) ([place]): 6 beats, average 113 bpm   4. Physical deconditioning   5. Heat intolerance     PLAN:  Caroline [name] is a very pleasant 30 year-old female who has a longstanding history of orthostatic dizziness but states that since having an insect bite, her symptoms have worsened.  She had undergone comprehensive neurologic assessment and an MRI has shown stable incidental pineal cyst and a small right frontal periventricular focus of nonspecific gliosis.  She states that she has become intolerant to heat and is constantly cold.  She is felt to have possible vasovagal syncope versus orthostatic hypotension.  When I initially saw her,  laboratory including CBC, CMP, thyroid function studies laboratory for Lyme disease, [place] spotted fever, ANA and CPK of all been normal.  On her initial evaluation with me in November 2020 her blood pressure was low with minimal orthostasis and supine blood pressure dropping from 104 to 94 going from supine to standing position.  At that time I recommended support stockings.  Her echo Doppler study demonstrated normal systolic and diastolic function.  She has normal wall motion and normal valvular architecture.  There is no evidence for pulmonary hypertension.  A 14-day Zio patch cardiac monitor showed predominant sinus rhythm with an average rate at 86 bpm.  Her slowest heart rate occurred while sleeping and was sinus bradycardia at 51.  Her fastest heart rate was sinus tachycardia up to 173 bpm which occurred while she was at work.  There was one episode of nonsustained ventricular tachycardia lasting 6 beats at an average rate of 135 bpm for which she was completely  asymptomatic. She  has had issues with equilibrium and also has issues with  "colonic inertia" "and at times had gone almost days without a bowel movement.  She has seen several GI physicians including Dr. Carlis Abbott, Dr. [name] and Dr. [name].  She had taken metoprolol on 1 occasion.  She has recently been started on birth control pills for an ovarian cyst and irregular cycles.  She does not do routine exercise and gets short of breath with increased heart rate without much activity which I still believe is reflective of reduced cardiovascular aerobic capacity.  She will be undergoing an evaluation with Dr. [name] in July.  She is not orthostatic on exam today and her ECG remains stable.  I have recommended that she try to initiate an exercise program.  If she notes recurrent significant tachypalpitations to take as needed metoprolol tartrate.  I will see her in 6 to 9 months for follow-up evaluation.   Medication Adjustments/Labs and Tests Ordered: Current medicines are reviewed at length with the patient today.  Concerns regarding medicines are outlined above.  Medication changes, Labs and Tests ordered today are listed in the Patient Instructions below. Patient Instructions  Medication Instructions:  Your Physician recommend you continue on your current  medication as directed.    *If you need a refill on your cardiac medications before your next appointment, please call your pharmacy*   Lab Work: None ordered today   Testing/Procedures: None ordered today   Follow-Up: At [facility], you and your health needs are our priority.  As part of our continuing mission to provide you with exceptional heart care, we have created designated Provider Care Teams.  These Care Teams include your primary Cardiologist (physician) and Advanced Practice Providers (APPs -  Physician Assistants and Nurse Practitioners) who all work together to provide you with the care you need, when you need it.  We  recommend signing up for the patient portal called "MyChart".  Sign up information is provided on this After Visit Summary.  MyChart is used to connect with patients for Virtual Visits (Telemedicine).  Patients are able to view lab/test results, encounter notes, upcoming appointments, etc.  Non-urgent messages can be sent to your provider as well.   To learn more about what you can do with MyChart, go to [email or web address].    Your next appointment:   6-8 month(s)  The format for your next appointment:   In Person  Provider:   [name], MD       Signed, [name], MD  05/26/2021 2:56 PM    [facility] [address], [place], [place], [place]  [address] Phone: [phone]

## 2021-05-25 ENCOUNTER — Ambulatory Visit: Payer: BC Managed Care – PPO | Admitting: Family Medicine

## 2021-05-26 ENCOUNTER — Encounter: Payer: Self-pay | Admitting: Cardiovascular Disease

## 2021-06-02 DIAGNOSIS — M7918 Myalgia, other site: Secondary | ICD-10-CM | POA: Diagnosis not present

## 2021-06-02 DIAGNOSIS — M9901 Segmental and somatic dysfunction of cervical region: Secondary | ICD-10-CM | POA: Diagnosis not present

## 2021-06-02 DIAGNOSIS — M9902 Segmental and somatic dysfunction of thoracic region: Secondary | ICD-10-CM | POA: Diagnosis not present

## 2021-06-02 DIAGNOSIS — M542 Cervicalgia: Secondary | ICD-10-CM | POA: Diagnosis not present

## 2021-06-06 DIAGNOSIS — N926 Irregular menstruation, unspecified: Secondary | ICD-10-CM | POA: Diagnosis not present

## 2021-06-06 DIAGNOSIS — N83201 Unspecified ovarian cyst, right side: Secondary | ICD-10-CM | POA: Diagnosis not present

## 2021-06-16 DIAGNOSIS — M9904 Segmental and somatic dysfunction of sacral region: Secondary | ICD-10-CM | POA: Diagnosis not present

## 2021-06-16 DIAGNOSIS — M545 Low back pain, unspecified: Secondary | ICD-10-CM | POA: Diagnosis not present

## 2021-06-17 ENCOUNTER — Telehealth: Payer: Self-pay | Admitting: Cardiovascular Disease

## 2021-06-17 NOTE — Telephone Encounter (Signed)
Patient wants to change providers to see Dr. Rockey Situ .  Is this ok with you both?

## 2021-06-20 NOTE — Telephone Encounter (Signed)
Ok by me

## 2021-06-21 DIAGNOSIS — Z20822 Contact with and (suspected) exposure to covid-19: Secondary | ICD-10-CM | POA: Diagnosis not present

## 2021-06-28 ENCOUNTER — Telehealth: Payer: BC Managed Care – PPO | Admitting: Family Medicine

## 2021-06-28 ENCOUNTER — Encounter: Payer: Self-pay | Admitting: Family Medicine

## 2021-06-28 ENCOUNTER — Telehealth: Payer: Self-pay

## 2021-06-28 VITALS — Ht 67.0 in | Wt 153.1 lb

## 2021-06-28 DIAGNOSIS — R000 Tachycardia, unspecified: Secondary | ICD-10-CM

## 2021-06-28 DIAGNOSIS — M545 Low back pain, unspecified: Secondary | ICD-10-CM | POA: Diagnosis not present

## 2021-06-28 DIAGNOSIS — J0100 Acute maxillary sinusitis, unspecified: Secondary | ICD-10-CM

## 2021-06-28 DIAGNOSIS — E559 Vitamin D deficiency, unspecified: Secondary | ICD-10-CM | POA: Diagnosis not present

## 2021-06-28 MED ORDER — FLUCONAZOLE 150 MG PO TABS: 150.0000 mg | ORAL_TABLET | Freq: Once | ORAL | 0 refills | Status: AC

## 2021-06-28 MED ORDER — AMOXICILLIN 500 MG PO CAPS: 500.0000 mg | ORAL_CAPSULE | Freq: Three times a day (TID) | ORAL | 0 refills | Status: AC

## 2021-06-28 NOTE — Telephone Encounter (Signed)
Diflucan sent in

## 2021-06-28 NOTE — Progress Notes (Signed)
Patient ID: Caroline [name], female    DOB: 02-28-1991, 30 y.o.   MRN: [id number]  Virtual visit completed through [place], a video enabled telemedicine application. Due to national recommendations of social distancing due to COVID-19, a virtual visit is felt to be most appropriate for this patient at this time. Reviewed limitations, risks, security and privacy concerns of performing a virtual visit and the availability of in person appointments. I also reviewed that there may be a patient responsible charge related to this service. The patient agreed to proceed.   Patient location: home Provider location: [place] [facility], office Persons participating in this virtual visit: patient, provider   If any vitals were documented, they were collected by patient at home unless specified below.    Ht 5\' 7"  (1.702 m)   Wt 153 lb 2 oz (69.5 kg)   LMP 04/09/2021   BMI 23.98 kg/m    CC: sinus congestion Subjective:   HPI: Caroline [name] is a 30 y.o. female presenting on 06/28/2021 for Sinus Problem (C/o nasal congestion and drainage with blood.  Sxs started about 1.5 ago. )   1.5 wk h/o nose bleeds associated with post nasal drainage, sinus congestion. Chills. Some bilateral maxillary sinus tenderness R>L.   No fevers, cough, HA.    Treating with nasal saline, nasacort.  Will start plain mucinex  Negative covid swab last week.   Requests routine labwork including urinalysis through [facility]. Last UTI 10/2020. No current UTI symptoms including dysuria, frequency, urgency, hematuria, flank pain. Notes fatigue, lower back pain.      Relevant past medical, surgical, family and social history reviewed and updated as indicated. Interim medical history since our last visit reviewed. Allergies and medications reviewed and updated. Outpatient Medications Prior to Visit  Medication Sig Dispense Refill   ACZONE 7.5 % GEL Apply a small amount to affected area every morning     cetirizine (ZYRTEC)  10 MG tablet Take 10 mg by mouth daily as needed for allergies.     LORazepam (ATIVAN) 0.5 MG tablet Take 0.5-1 tablets (0.25-0.5 mg total) by mouth at bedtime as needed for anxiety or sleep. 20 tablet 0   metoprolol tartrate (LOPRESSOR) 25 MG tablet Take 0.5-1 tablets (12.5-25 mg total) by mouth 2 (two) times daily as needed (tachycardia). 30 tablet 0   Multiple Vitamins-Calcium (ONE-A-DAY WOMENS FORMULA PO) Take 1 tablet by mouth daily.      Norethindrone-Ethinyl Estradiol-Fe Biphas (LO LOESTRIN FE) 1 MG-10 MCG / 10 MCG tablet Take 1 tablet by mouth daily.     polyethylene glycol (MIRALAX / GLYCOLAX) packet Take 17 g by mouth daily. As needed     triamcinolone (NASACORT) 55 MCG/ACT AERO nasal inhaler Place 2 sprays into the nose daily. 1 Inhaler    Vitamin D, Cholecalciferol, 10 MCG (400 UNIT) CAPS Take 1 capsule by mouth daily. 150 capsule    No facility-administered medications prior to visit.     Per HPI unless specifically indicated in ROS section below Review of Systems Objective:  Ht 5\' 7"  (1.702 m)   Wt 153 lb 2 oz (69.5 kg)   LMP 04/09/2021   BMI 23.98 kg/m   Wt Readings from Last 3 Encounters:  06/28/21 153 lb 2 oz (69.5 kg)  05/19/21 153 lb 6.4 oz (69.6 kg)  05/11/21 152 lb 2 oz (69 kg)       Physical exam: Gen: alert, NAD, not ill appearing Pulm: speaks in complete sentences without increased work of  breathing Psych: normal mood, normal thought content      Assessment & Plan:  Will order labs through local [facility] draw station   Problem List Items Addressed This Visit     Vitamin D deficiency   Relevant Orders   VITAMIN D 25 Hydroxy (Vit-D Deficiency, Fractures)   Acute sinusitis - Primary    Anticipate acute sinusitis, cover for bacterial cause given duration and progression of symptoms.  Rx amoxicillin 500mg  TID 7d course given intolerance to higher doses of amoxicillin.  Discussed mucinex, INS and nasal saline PRN, holding steroid if epistaxis.        Relevant Medications   amoxicillin (AMOXIL) 500 MG capsule   Tachycardia   Relevant Orders   Comprehensive metabolic panel   TSH   CBC with Differential/Platelet   Other Visit Diagnoses     Midline low back pain without sciatica, unspecified chronicity       Relevant Orders   Urinalysis, Routine w reflex microscopic        Meds ordered this encounter  Medications   amoxicillin (AMOXIL) 500 MG capsule    Sig: Take 1 capsule (500 mg total) by mouth 3 (three) times daily for 7 days.    Dispense:  21 capsule    Refill:  0   Orders Placed This Encounter  Procedures   VITAMIN D 25 Hydroxy (Vit-D Deficiency, Fractures)    Standing Status:   Future    Number of Occurrences:   1    Standing Expiration Date:   06/28/2022   Comprehensive metabolic panel    Standing Status:   Future    Number of Occurrences:   1    Standing Expiration Date:   06/28/2022   TSH    Standing Status:   Future    Number of Occurrences:   1    Standing Expiration Date:   06/28/2022   CBC with Differential/Platelet    Standing Status:   Future    Number of Occurrences:   1    Standing Expiration Date:   06/28/2022   Urinalysis, Routine w reflex microscopic    Standing Status:   Future    Number of Occurrences:   1    Standing Expiration Date:   06/28/2022    I discussed the assessment and treatment plan with the patient. The patient was provided an opportunity to ask questions and all were answered. The patient agreed with the plan and demonstrated an understanding of the instructions. The patient was advised to call back or seek an in-person evaluation if the symptoms worsen or if the condition fails to improve as anticipated.  Follow up plan: Return if symptoms worsen or fail to improve.  [name], MD

## 2021-06-28 NOTE — Assessment & Plan Note (Signed)
Anticipate acute sinusitis, cover for bacterial cause given duration and progression of symptoms.  Rx amoxicillin 500mg  TID 7d course given intolerance to higher doses of amoxicillin.  Discussed mucinex, INS and nasal saline PRN, holding steroid if epistaxis.

## 2021-06-28 NOTE — Telephone Encounter (Signed)
Notified pt by phn.  Expresses her thanks.

## 2021-06-28 NOTE — Telephone Encounter (Signed)
Received call from patient. Was seen in office today and given Amoxicillin. Patient has history of yeast infection with antibiotics would like to have a prescription for diflucan sent in as well.

## 2021-06-29 DIAGNOSIS — R000 Tachycardia, unspecified: Secondary | ICD-10-CM | POA: Diagnosis not present

## 2021-06-29 DIAGNOSIS — M545 Low back pain, unspecified: Secondary | ICD-10-CM | POA: Diagnosis not present

## 2021-06-29 DIAGNOSIS — E559 Vitamin D deficiency, unspecified: Secondary | ICD-10-CM | POA: Diagnosis not present

## 2021-06-30 LAB — URINALYSIS, ROUTINE W REFLEX MICROSCOPIC
Bilirubin, UA: NEGATIVE
Glucose, UA: NEGATIVE
Ketones, UA: NEGATIVE
Leukocytes,UA: NEGATIVE
Nitrite, UA: NEGATIVE
Protein,UA: NEGATIVE
Specific Gravity, UA: 1.018 (ref 1.005–1.030)
Urobilinogen, Ur: 0.2 mg/dL (ref 0.2–1.0)
pH, UA: 6 (ref 5.0–7.5)

## 2021-06-30 LAB — VITAMIN D 25 HYDROXY (VIT D DEFICIENCY, FRACTURES): Vit D, 25-Hydroxy: 41.6 ng/mL (ref 30.0–100.0)

## 2021-06-30 LAB — COMPREHENSIVE METABOLIC PANEL
ALT: 7 IU/L (ref 0–32)
AST: 13 IU/L (ref 0–40)
Albumin/Globulin Ratio: 1.9 (ref 1.2–2.2)
Albumin: 4.4 g/dL (ref 3.9–5.0)
Alkaline Phosphatase: 61 IU/L (ref 44–121)
BUN/Creatinine Ratio: 11 (ref 9–23)
BUN: 9 mg/dL (ref 6–20)
Bilirubin Total: 0.3 mg/dL (ref 0.0–1.2)
CO2: 21 mmol/L (ref 20–29)
Calcium: 9.6 mg/dL (ref 8.7–10.2)
Chloride: 104 mmol/L (ref 96–106)
Creatinine, Ser: 0.84 mg/dL (ref 0.57–1.00)
Globulin, Total: 2.3 g/dL (ref 1.5–4.5)
Glucose: 84 mg/dL (ref 65–99)
Potassium: 4.4 mmol/L (ref 3.5–5.2)
Sodium: 141 mmol/L (ref 134–144)
Total Protein: 6.7 g/dL (ref 6.0–8.5)
eGFR: 96 mL/min/{1.73_m2} (ref >59)

## 2021-06-30 LAB — CBC WITH DIFFERENTIAL/PLATELET
Basophils Absolute: 0 10*3/uL (ref 0.0–0.2)
Basos: 1 %
EOS (ABSOLUTE): 0.1 10*3/uL (ref 0.0–0.4)
Eos: 1 %
Hematocrit: 41.3 % (ref 34.0–46.6)
Hemoglobin: 13.9 g/dL (ref 11.1–15.9)
Immature Grans (Abs): 0 10*3/uL (ref 0.0–0.1)
Immature Granulocytes: 0 %
Lymphocytes Absolute: 2.9 10*3/uL (ref 0.7–3.1)
Lymphs: 37 %
MCH: 29.3 pg (ref 26.6–33.0)
MCHC: 33.7 g/dL (ref 31.5–35.7)
MCV: 87 fL (ref 79–97)
Monocytes Absolute: 0.5 10*3/uL (ref 0.1–0.9)
Monocytes: 7 %
Neutrophils Absolute: 4.3 10*3/uL (ref 1.4–7.0)
Neutrophils: 54 %
Platelets: 285 10*3/uL (ref 150–450)
RBC: 4.75 x10E6/uL (ref 3.77–5.28)
RDW: 12.4 % (ref 11.7–15.4)
WBC: 7.9 10*3/uL (ref 3.4–10.8)

## 2021-06-30 LAB — MICROSCOPIC EXAMINATION: Casts: NONE SEEN /lpf

## 2021-06-30 LAB — TSH: TSH: 2.93 u[IU]/mL (ref 0.450–4.500)

## 2021-07-04 DIAGNOSIS — M545 Low back pain, unspecified: Secondary | ICD-10-CM | POA: Diagnosis not present

## 2021-07-04 DIAGNOSIS — M9904 Segmental and somatic dysfunction of sacral region: Secondary | ICD-10-CM | POA: Diagnosis not present

## 2021-07-12 ENCOUNTER — Ambulatory Visit: Payer: BC Managed Care – PPO | Admitting: Internal Medicine

## 2021-07-12 ENCOUNTER — Encounter: Payer: Self-pay | Admitting: Internal Medicine

## 2021-07-12 VITALS — BP 100/62 | HR 86 | Ht 67.0 in | Wt 152.6 lb

## 2021-07-12 DIAGNOSIS — Z800 Family history of malignant neoplasm of digestive organs: Secondary | ICD-10-CM | POA: Diagnosis not present

## 2021-07-12 DIAGNOSIS — K599 Functional intestinal disorder, unspecified: Secondary | ICD-10-CM | POA: Diagnosis not present

## 2021-07-12 NOTE — Patient Instructions (Signed)
Great to see you today. We will put you in the system for a colonoscopy recall for 11/2031 under Dr Rush Landmark .   Due to recent changes in healthcare laws, you may see the results of your imaging and laboratory studies on MyChart before your provider has had a chance to review them.  We understand that in some cases there may be results that are confusing or concerning to you. Not all laboratory results come back in the same time frame and the provider may be waiting for multiple results in order to interpret others.  Please give [place] 48 hours in order for your provider to thoroughly review all the results before contacting the office for clarification of your results.   If you are age 28 or older, your body mass index should be between 23-30. Your Body mass index is 23.9 kg/m. If this is out of the aforementioned range listed, please consider follow up with your Primary Care Provider.  If you are age 26 or younger, your body mass index should be between 19-25. Your Body mass index is 23.9 kg/m. If this is out of the aformentioned range listed, please consider follow up with your Primary Care Provider.   __________________________________________________________  The [facility] providers would like to encourage you to use [facility] to communicate with providers for non-urgent requests or questions.  Due to long hold times on the telephone, sending your provider a message by [facility] may be a faster and more efficient way to get a response.  Please allow 48 business hours for a response.  Please remember that this is for non-urgent requests.   I appreciate the opportunity to care for you. Silvano Rusk, MD, [facility]

## 2021-07-12 NOTE — Progress Notes (Signed)
Caroline [name] 30 y.o. 1991/03/05 [id number]  Assessment & Plan:   Encounter Diagnoses  Name Primary?   Colonic inertia Yes   Family history of colon cancer in father       Continue current management of colonic inertia with intermittent MiraLAX and increased vegetable intake.  She is doing quite well with this.  Regarding family history of colon cancer in her father I think having a colonoscopy at 81 makes sense.  We will put a recall in for 2032 with Dr. Rush [name] who did her father's colonoscopy.  I may be retired by then likely so.   She will see me as needed otherwise.  She may have some autonomic dysfunction that could be related to her tachycardia etc. as well as her colonic inertia though I cannot completely explain that.  Await upcoming cardiology evaluation with Dr. Rockey [name].  I do not think that will impact her gut care.  I appreciate the opportunity to care for this patient. CC: Caroline Bush, MD   Subjective:   Chief Complaint: Colonic inertia family history of colon cancer  HPI 30 year old white woman with a history of colonic inertia previously seen by me over 10 years ago with a negative colonoscopy, went to [facility] and saw Dr. Gunnar [name] and the consult note I have indicates she was referred for anorectal manometry and hydrogen breath testing.  She is here today to discuss family history of colon cancer.  Sits marker study in 2013 demonstrated 38 markers in the ascending to mid transverse colon.  She has managed her colonic inertia quite well using MiraLAX only using it several times a week now it seems.  Has increased the amount of vegetables she walks a lot.  Her father was diagnosed with stage IV colorectal cancer at age 65.  There is some family member either a grand parent of his or a great grandparent of his that could have had colon cancer that was a rare form.  Caroline [name] is not certain of those details.  Father is responding to chemotherapy administered by Dr.  Benay [name], fortunately.  She does report feeling bloated and tired when she is in a situation where she has moved her bowels several days, she may also get tachycardic which resolves with defecation.  She uses a Fitbit to monitor her heart rates.  [name] is also having problems with heart rate variability and palpitations and presyncope episodes she has seen Dr. Claiborne [name].  This started after some sort of bite with rash though serologic evaluations to look for Lyme disease and other illnesses have been unrevealing.  She is getting a second opinion from Dr. Rockey [name] because of these persistent issues. Allergies  Allergen Reactions   Amoxicillin Other (See Comments)    Does not do larger doses of amoxicillin   Doxycycline Nausea And Vomiting   Linzess [Linaclotide] Other (See Comments)    Bloating, abd pain   Current Meds  Medication Sig   ACZONE 7.5 % GEL Apply a small amount to affected area every morning   cetirizine (ZYRTEC) 10 MG tablet Take 10 mg by mouth daily as needed for allergies.   LORazepam (ATIVAN) 0.5 MG tablet Take 0.5-1 tablets (0.25-0.5 mg total) by mouth at bedtime as needed for anxiety or sleep.   Multiple Vitamins-Calcium (ONE-A-DAY WOMENS FORMULA PO) Take 1 tablet by mouth daily.    Norethindrone Acetate-Ethinyl Estrad-FE (BLISOVI 24 FE) 1-20 MG-MCG(24) tablet Take 1 tablet by mouth daily.   polyethylene glycol (  MIRALAX / GLYCOLAX) packet Take 17 g by mouth daily. As needed   triamcinolone (NASACORT) 55 MCG/ACT AERO nasal inhaler Place 2 sprays into the nose daily.   Past Medical History:  Diagnosis Date   Colonic inertia 2007   Dr Caroline [name] ([facility])   Dysplastic nevus 11/19/2019   Left upper abdomen. Mild atypia.   History of chicken pox    Influenza B 11/19/2018   Seasonal allergic rhinitis    Past Surgical History:  Procedure Laterality Date   COLONOSCOPY  2010   WNL Caroline [name])   EXCISION MORTON'S NEUROMA Left 2007   WISDOM TOOTH EXTRACTION  2010   Social History    Social History Narrative   Caffeine use: 1 cup coffee/day and some tea   Lives at home with mother, father, and sister and pets (horses, ducks, Sales promotion account executive, fish, wild [place])   Occ: Neurosurgeon, also student   Edu: bachelor's biochem [employer] and studying Health visitor   Activity: no regular exercise   Diet: good water, fruits/vegetables daily      family history includes Cancer in her paternal grandmother; Colon cancer in her father; Colon polyps in her father; Dementia in her maternal grandmother; Heart failure in her maternal grandmother; Hypertension in her father, maternal grandmother, and paternal grandmother; Hypothyroidism in her mother; Liver disease in her father; Stroke in her maternal grandmother.   Review of Systems  As per HPI some insomnia weight gain fatigue dyspnea at times allergies.  All other review of systems negative. Objective:   Physical Exam @BP  100/62   Pulse 86   Ht 5\' 7"  (1.702 m)   Wt 152 lb 9.6 oz (69.2 kg)   SpO2 98%   BMI 23.90 kg/m @  General:  NAD Eyes:   anicteric Lungs:  clear Heart::  S1S2 no rubs, murmurs or gallops Abdomen:  soft and nontender, BS+ Ext:   no edema, cyanosis or clubbing    Data Reviewed:  Cardiology notes primary care notes previous GI work-up from years ago

## 2021-07-20 ENCOUNTER — Ambulatory Visit: Payer: BC Managed Care – PPO | Admitting: Cardiovascular Disease

## 2021-07-20 ENCOUNTER — Other Ambulatory Visit: Payer: Self-pay

## 2021-07-20 ENCOUNTER — Encounter: Payer: Self-pay | Admitting: Cardiovascular Disease

## 2021-07-20 VITALS — BP 108/78 | HR 89 | Ht 67.0 in | Wt 154.4 lb

## 2021-07-20 DIAGNOSIS — I479 Paroxysmal tachycardia, unspecified: Secondary | ICD-10-CM | POA: Diagnosis not present

## 2021-07-20 DIAGNOSIS — R42 Dizziness and giddiness: Secondary | ICD-10-CM

## 2021-07-20 MED ORDER — PROPRANOLOL HCL 10 MG PO TABS: 10.0000 mg | ORAL_TABLET | Freq: Three times a day (TID) | ORAL | 1 refills | Status: DC | PRN

## 2021-07-20 NOTE — Progress Notes (Signed)
Cardiology Office Note  Date:  07/20/2021   ID:  Caroline [name], DOB [birth year], MRN [id number]  PCP:  [name], MD   Chief Complaint  Patient presents with   Other    HR issues and dizziness/syncopal spells w/activity. Meds reviewed verbally with pt.    HPI:  Caroline [name] is a 30 y.o. female with past medical history of Colonic inertia Chronic fatigue Dizziness with difficulty with balance and gait, heat intolerance and fatigability.  remote history of seizures with her last in 2007  Who presents to establish care in the [place] office, follow-up of her tachycardia  Seen by cardiology January 12, 2020  echo Doppler study from December 03, 2019 which showed EF of 60 to 65% with normal wall motion.   Zio patch monitor   showed predominant sinus rhythm with an average rate of 86 bpm.  The slowest heart rate was sinus bradycardia while sleeping and the fastest heart rate was sinus tachycardia at 173 bpm when she was at work. There was a 6 beat episode of NSVT at a rate of 135 which was completely asymptomatic.   Triggered events associated with sinus tachycardia   Seen in clinic April 12,2022 continued to  notice her heart rate may speed up to the 130s.    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   Felt by former cardiology to have cardiovascular deconditioning  Per cardiologist suggested she may try metoprolol succinate 12.5 mg on a daily basis  In follow-up today she reports that she tried metoprolol tartrate once but this did not make her feel very well, had side effects Would prefer not to take this medication Continues to appreciate tachycardia, exacerbated by heat, walking longer distances  Able to monitor heart rate using a watch monitoring system  EKG personally reviewed by myself on todays visit Shows normal sinus rhythm rate 89 bpm no significant ST-T wave changes   PMH:   has a past medical history of Colonic inertia (2007), Dysplastic nevus  (11/19/2019), History of chicken pox, Influenza B (11/19/2018), and Seasonal allergic rhinitis.  PSH:    Past Surgical History:  Procedure Laterality Date   COLONOSCOPY  2010   WNL [name])   EXCISION MORTON'S NEUROMA Left 2007   WISDOM TOOTH EXTRACTION  2010    Current Outpatient Medications  Medication Sig Dispense Refill   ACZONE 7.5 % GEL Apply a small amount to affected area every morning     cetirizine (ZYRTEC) 10 MG tablet Take 10 mg by mouth daily as needed for allergies.     LORazepam (ATIVAN) 0.5 MG tablet Take 0.5-1 tablets (0.25-0.5 mg total) by mouth at bedtime as needed for anxiety or sleep. 20 tablet 0   Multiple Vitamins-Calcium (ONE-A-DAY WOMENS FORMULA PO) Take 1 tablet by mouth daily.      Norethindrone Acetate-Ethinyl Estrad-FE (BLISOVI 24 FE) 1-20 MG-MCG(24) tablet Take 1 tablet by mouth daily.     polyethylene glycol (MIRALAX / GLYCOLAX) packet Take 17 g by mouth daily. As needed     triamcinolone (NASACORT) 55 MCG/ACT AERO nasal inhaler Place 2 sprays into the nose daily. 1 Inhaler    Vitamin D, Cholecalciferol, 10 MCG (400 UNIT) CAPS Take 1 capsule by mouth daily. 150 capsule    metoprolol tartrate (LOPRESSOR) 25 MG tablet Take 0.5-1 tablets (12.5-25 mg total) by mouth 2 (two) times daily as needed (tachycardia). (Patient not taking: Reported on 07/20/2021) 30 tablet 0   No current facility-administered medications for this visit.  Allergies:   Amoxicillin, Doxycycline, and Linzess [linaclotide]   Social History:  The patient  reports that she has never smoked. She has never used smokeless tobacco. She reports current alcohol use. She reports that she does not use drugs.   Family History:   family history includes Cancer in her paternal grandmother; Colon polyps in her father; Dementia in her maternal grandmother; Heart failure in her maternal grandmother; Hypertension in her father, maternal grandmother, and paternal grandmother; Hypothyroidism in her mother;  Liver disease in her father; Stroke in her maternal grandmother.    Review of Systems: Review of Systems  Constitutional: Negative.   HENT: Negative.    Respiratory: Negative.    Cardiovascular:  Positive for palpitations.       Tachycardia   Gastrointestinal: Negative.   Musculoskeletal: Negative.   Neurological: Negative.   Psychiatric/Behavioral: Negative.    All other systems reviewed and are negative.   PHYSICAL EXAM: VS:  BP 108/78 (BP Location: Left Arm, Patient Position: Sitting, Cuff Size: Normal)   Pulse 89   Ht '5\' 7"'$  (1.702 m)   Wt 154 lb 6 oz (70 kg)   SpO2 99%   BMI 24.18 kg/m  , BMI Body mass index is 24.18 kg/m. GEN: Well nourished, well developed, in no acute distress HEENT: normal Neck: no JVD, carotid bruits, or masses Cardiac: RRR; no murmurs, rubs, or gallops,no edema  Respiratory:  clear to auscultation bilaterally, normal work of breathing GI: soft, nontender, nondistended, + BS MS: no deformity or atrophy Skin: warm and dry, no rash Neuro:  Strength and sensation are intact Psych: euthymic mood, full affect  Recent Labs: 06/29/2021: ALT 7; BUN 9; Creatinine, Ser 0.84; Hemoglobin 13.9; Platelets 285; Potassium 4.4; Sodium 141; TSH 2.930    Lipid Panel Lab Results  Component Value Date   CHOL 154 12/05/2019   HDL 71 12/05/2019   LDLCALC 70 12/05/2019   TRIG 67 12/05/2019      Wt Readings from Last 3 Encounters:  07/20/21 154 lb 6 oz (70 kg)  07/12/21 152 lb 9.6 oz (69.2 kg)  06/28/21 153 lb 2 oz (69.5 kg)      ASSESSMENT AND PLAN:  Problem List Items Addressed This Visit       Cardiology Problems   Paroxysmal tachycardia (HCC)     Other   Lightheaded - Primary   Sinus tachycardia Symptomatic, does not tolerate heat, exercise Symptoms do improve with Ativan provided by primary care We have recommended avoiding exertion in the heat, recommend she do this in the colder weather Avoid dehydration/stay hydrated She would prefer  not to take metoprolol tartrate for rate control We will try propranolol 10 mg up to 3 times a day as needed Might need 20 mg if no relief on 10 mg    Total encounter time more than 35 minutes  Greater than 50% was spent in counseling and coordination of care with the patient     Signed, Esmond Plants, M.D., Ph.D. [place], [place]

## 2021-07-20 NOTE — Patient Instructions (Signed)
Medication Instructions:  Please try propranolol 10 mg up to three times a day as needed You can double up to 20 mg at one time if needed  If you need a refill on your cardiac medications before your next appointment, please call your pharmacy.    Lab work: No new labs needed   If you have labs (blood work) drawn today and your tests are completely normal, you will receive your results only by: [place] (if you have MyChart) OR A paper copy in the mail If you have any lab test that is abnormal or we need to change your treatment, we will call you to review the results.   Testing/Procedures: No new testing needed   Follow-Up: At [facility], you and your health needs are our priority.  As part of our continuing mission to provide you with exceptional heart care, we have created designated Provider Care Teams.  These Care Teams include your primary Cardiologist (physician) and Advanced Practice Providers (APPs -  Physician Assistants and Nurse Practitioners) who all work together to provide you with the care you need, when you need it.  You will need a follow up appointment as needed  Providers on your designated Care Team:   Murray Hodgkins, NP Christell Faith, PA-C Marrianne Mood, PA-C Cadence Kathlen Mody, [place]  Any Other Special Instructions Will Be Listed Below (If Applicable).  COVID-19 Vaccine Information can be found at: ShippingScam.co.uk For questions related to vaccine distribution or appointments, please email [email or web address] or call [phone].

## 2021-07-29 ENCOUNTER — Ambulatory Visit: Payer: BC Managed Care – PPO | Admitting: Cardiovascular Disease

## 2021-08-08 DIAGNOSIS — M9904 Segmental and somatic dysfunction of sacral region: Secondary | ICD-10-CM | POA: Diagnosis not present

## 2021-08-08 DIAGNOSIS — M545 Low back pain, unspecified: Secondary | ICD-10-CM | POA: Diagnosis not present

## 2021-08-16 ENCOUNTER — Other Ambulatory Visit: Payer: Self-pay

## 2021-08-16 ENCOUNTER — Ambulatory Visit: Payer: BC Managed Care – PPO | Admitting: Family Medicine

## 2021-08-16 VITALS — BP 100/50 | HR 102 | Temp 98.2°F | Ht 67.0 in | Wt 150.0 lb

## 2021-08-16 DIAGNOSIS — L509 Urticaria, unspecified: Secondary | ICD-10-CM | POA: Insufficient documentation

## 2021-08-16 HISTORY — DX: Urticaria, unspecified: L50.9

## 2021-08-16 MED ORDER — HYDROXYZINE HCL 25 MG PO TABS: 25.0000 mg | ORAL_TABLET | Freq: Four times a day (QID) | ORAL | 0 refills | Status: DC | PRN

## 2021-08-16 NOTE — Assessment & Plan Note (Signed)
Advised allergy medication or famotidine daily. Hydroxyzine to see if less sedating than benadryl q6hr prn. Trigger seems consistent - cleaning product which she was also exposed to several years ago. Discussed testing would not necessarily be helpful - but she sees ENT/allergist and will reach out to them to confirm. Advised prednisone not typically needed but could consider if worsening/no improvement.

## 2021-08-16 NOTE — Progress Notes (Signed)
Subjective:     Caroline [name] is a 30 y.o. female presenting for Urticaria (Entire body, but severely on legs since Saturday AM. Thinks she is allergic to a cleaning solution used at the hospital)     HPI  #Hives - started on Saturday AM - was improving but then came back today with a vengence - on arms and legs - has had 2 allergic reactions in the past - when 30 yo and then in 2015 - [employer] cleaning solution oxyside  - was in the ER w/ her mom - and was sitting in a recliner and in shorts so exposed on Friday night  - treatment: cortisone cream and benadryl    Review of Systems   Social History   Tobacco Use  Smoking Status Never  Smokeless Tobacco Never        Objective:    BP Readings from Last 3 Encounters:  08/16/21 (!) 100/50  07/20/21 108/78  07/12/21 100/62   Wt Readings from Last 3 Encounters:  08/16/21 150 lb (68 kg)  07/20/21 154 lb 6 oz (70 kg)  07/12/21 152 lb 9.6 oz (69.2 kg)    BP (!) 100/50   Pulse (!) 102   Temp 98.2 F (36.8 C) (Temporal)   Ht '5\' 7"'$  (1.702 m)   Wt 150 lb (68 kg)   SpO2 98%   BMI 23.49 kg/m    Physical Exam Constitutional:      General: She is not in acute distress.    Appearance: She is well-developed. She is not diaphoretic.  HENT:     Right Ear: External ear normal.     Left Ear: External ear normal.  Eyes:     Conjunctiva/sclera: Conjunctivae normal.  Cardiovascular:     Rate and Rhythm: Normal rate.  Pulmonary:     Effort: Pulmonary effort is normal.  Musculoskeletal:     Cervical back: Neck supple.  Skin:    General: Skin is warm and dry.     Capillary Refill: Capillary refill takes less than 2 seconds.     Comments: Erythematous wheels of varying sizes covering the arms and legs.   Neurological:     Mental Status: She is alert. Mental status is at baseline.  Psychiatric:        Mood and Affect: Mood normal.        Behavior: Behavior normal.          Assessment & Plan:   Problem List  Items Addressed This Visit       Musculoskeletal and Integument   Hives - Primary    Advised allergy medication or famotidine daily. Hydroxyzine to see if less sedating than benadryl q6hr prn. Trigger seems consistent - cleaning product which she was also exposed to several years ago. Discussed testing would not necessarily be helpful - but she sees ENT/allergist and will reach out to them to confirm. Advised prednisone not typically needed but could consider if worsening/no improvement.       Relevant Medications   hydrOXYzine (ATARAX/VISTARIL) 25 MG tablet   I spent >15 minutes with pt , obtaining history, examining, reviewing chart, documenting encounter and discussing the above plan of care.   Return if symptoms worsen or fail to improve.  Lesleigh Noe, MD  This visit occurred during the SARS-CoV-2 public health emergency.  Safety protocols were in place, including screening questions prior to the visit, additional usage of staff PPE, and extensive cleaning of exam room while observing appropriate  contact time as indicated for disinfecting solutions.

## 2021-08-16 NOTE — Patient Instructions (Addendum)
#  Hives - Take: Allegra (non drowsy)  - if not working - would recommend zantec or prilosec (non drowsy)    Allegra, zantec, claritin --- 24 hour allergy/heartburn medications  ++++  Either benadryl or Hydroxyzine (as needed that may make you drowsy)   Do not take benadryl AND hydroxyzine within 6 hours of each other  OK to take with allegra or zantac   Reach out in 2 days if no improvement and miserable - could consider steroids but would likely avoid

## 2021-08-19 ENCOUNTER — Ambulatory Visit: Payer: BC Managed Care – PPO | Admitting: Family Medicine

## 2021-08-23 ENCOUNTER — Encounter: Payer: Self-pay | Admitting: Family Medicine

## 2021-08-23 ENCOUNTER — Telehealth: Payer: BC Managed Care – PPO | Admitting: Family Medicine

## 2021-08-23 ENCOUNTER — Other Ambulatory Visit (INDEPENDENT_AMBULATORY_CARE_PROVIDER_SITE_OTHER): Payer: BC Managed Care – PPO

## 2021-08-23 ENCOUNTER — Other Ambulatory Visit: Payer: Self-pay

## 2021-08-23 ENCOUNTER — Other Ambulatory Visit: Payer: Self-pay | Admitting: Family Medicine

## 2021-08-23 DIAGNOSIS — Z20822 Contact with and (suspected) exposure to covid-19: Secondary | ICD-10-CM

## 2021-08-23 DIAGNOSIS — J069 Acute upper respiratory infection, unspecified: Secondary | ICD-10-CM

## 2021-08-23 LAB — POCT INFLUENZA A/B
Influenza A, POC: NEGATIVE
Influenza B, POC: NEGATIVE

## 2021-08-23 MED ORDER — ONDANSETRON HCL 4 MG PO TABS: 4.0000 mg | ORAL_TABLET | Freq: Three times a day (TID) | ORAL | 0 refills | Status: DC | PRN

## 2021-08-23 NOTE — Progress Notes (Signed)
I connected with Caroline [name] on 08/23/21 at 10:40 AM EDT by video and verified that I am speaking with the correct person using two identifiers.   I discussed the limitations, risks, security and privacy concerns of performing an evaluation and management service by video and the availability of in person appointments. I also discussed with the patient that there may be a patient responsible charge related to this service. The patient expressed understanding and agreed to proceed.  Patient location: Home Provider Location: [place] Participants: Lesleigh Noe and Para March   Subjective:     Caroline [name] is a 30 y.o. female presenting for Chills (Started yesterday. Neoma Laming got a PCR through [facility] this morning, waiting on results), Fever (101), Fatigue, Generalized Body Aches, Sore Throat, and Cough     Fever  Associated symptoms include coughing, nausea and a sore throat. Pertinent negatives include no chest pain, congestion, diarrhea or vomiting. She has tried acetaminophen and NSAIDs for the symptoms. The treatment provided mild relief.  Sore Throat  Associated symptoms include coughing and shortness of breath. Pertinent negatives include no congestion, diarrhea or vomiting.  Cough Associated symptoms include chills, a fever, myalgias, rhinorrhea, a sore throat and shortness of breath. Pertinent negatives include no chest pain.  URI  This is a new problem. Episode onset: 08/22/2021. The maximum temperature recorded prior to her arrival was 101 - 101.9 F. Associated symptoms include coughing, nausea, rhinorrhea and a sore throat. Pertinent negatives include no chest pain, congestion, diarrhea or vomiting.    Period started with cramping - took motrin  Review of Systems  Constitutional:  Positive for chills, fatigue and fever.  HENT:  Positive for rhinorrhea, sore throat and voice change. Negative for congestion.   Respiratory:  Positive for cough and shortness  of breath.   Cardiovascular:  Positive for palpitations. Negative for chest pain.  Gastrointestinal:  Positive for nausea. Negative for diarrhea and vomiting.  Musculoskeletal:  Positive for arthralgias and myalgias.    Social History   Tobacco Use  Smoking Status Never  Smokeless Tobacco Never        Objective:   BP Readings from Last 3 Encounters:  08/16/21 (!) 100/50  07/20/21 108/78  07/12/21 100/62   Wt Readings from Last 3 Encounters:  08/16/21 150 lb (68 kg)  07/20/21 154 lb 6 oz (70 kg)  07/12/21 152 lb 9.6 oz (69.2 kg)   There were no vitals taken for this visit.   Physical Exam Constitutional:      Appearance: Normal appearance. She is not ill-appearing.  HENT:     Head: Normocephalic and atraumatic.     Right Ear: External ear normal.     Left Ear: External ear normal.  Eyes:     Conjunctiva/sclera: Conjunctivae normal.  Pulmonary:     Effort: Pulmonary effort is normal. No respiratory distress.  Neurological:     Mental Status: She is alert. Mental status is at baseline.  Psychiatric:        Mood and Affect: Mood normal.        Behavior: Behavior normal.        Thought Content: Thought content normal.        Judgment: Judgment normal.          Assessment & Plan:   Problem List Items Addressed This Visit   None Visit Diagnoses     Suspected COVID-19 virus infection    -  Primary   Relevant  Medications   ondansetron (ZOFRAN) 4 MG tablet      Covid test pending Will test flu today - if positive treat with tamiflu Symptomatic care  Isolation and ER precautions discussed  Return if symptoms worsen or fail to improve.  Lesleigh Noe, MD

## 2021-08-24 ENCOUNTER — Ambulatory Visit: Payer: BC Managed Care – PPO | Admitting: Family Medicine

## 2021-08-30 ENCOUNTER — Telehealth: Payer: Self-pay | Admitting: Family Medicine

## 2021-08-30 NOTE — Telephone Encounter (Signed)
Pt saw Dr cody on 8/30 she is still having symptoms and would like a call bk on f/u

## 2021-08-31 NOTE — Telephone Encounter (Signed)
Pt scheduled an appt for 9/8 with M.Cable

## 2021-09-01 ENCOUNTER — Telehealth: Payer: BC Managed Care – PPO | Admitting: Nurse Practitioner

## 2021-09-01 ENCOUNTER — Other Ambulatory Visit: Payer: Self-pay

## 2021-09-01 VITALS — Temp 97.5°F

## 2021-09-01 DIAGNOSIS — R059 Cough, unspecified: Secondary | ICD-10-CM | POA: Diagnosis not present

## 2021-09-01 DIAGNOSIS — B370 Candidal stomatitis: Secondary | ICD-10-CM | POA: Diagnosis not present

## 2021-09-01 MED ORDER — NYSTATIN 100000 UNIT/ML MT SUSP: 5.0000 mL | Freq: Four times a day (QID) | OROMUCOSAL | 0 refills | Status: AC

## 2021-09-01 NOTE — Progress Notes (Signed)
Patient ID: Caroline [name], female    DOB: [birth year], 30 y.o.   MRN: [id number]  Virtual visit completed through [employer], a video enabled telemedicine application. Due to national recommendations of social distancing due to COVID-19, a virtual visit is felt to be most appropriate for this patient at this time. Reviewed limitations, risks, security and privacy concerns of performing a virtual visit and the availability of in person appointments. I also reviewed that there may be a patient responsible charge related to this service. The patient agreed to proceed.   Patient location: home Provider location: [place] at [facility], office Persons participating in this virtual visit: patient, provider   If any vitals were documented, they were collected by patient at home unless specified below.    Temp (!) 97.5 F (36.4 C) Comment: per patient   CC: Post Covid 19 infection  Subjective:   HPI: Caroline [name] is a 30 y.o. female presenting on 09/01/2021 for an acute video visit  08/22/2021 symptoms started and on 08/24/2021 pcr test was positive for covid. Fever broke on Saturday morning. Saturday evening she developed congestion. She is experiencing fatigue and brain fog. Patient has been using motrin and takes zyrtec daily. Burning sensation in mouth. Wears retain type devices and has a "cottage cheese" residue. The oral sympotms began Monday   Relevant past medical, surgical, family and social history reviewed and updated as indicated. Interim medical history since our last visit reviewed. Allergies and medications reviewed and updated. Outpatient Medications Prior to Visit  Medication Sig Dispense Refill   ACZONE 7.5 % GEL Apply a small amount to affected area every morning     cetirizine (ZYRTEC) 10 MG tablet Take 10 mg by mouth daily as needed for allergies.     diphenhydrAMINE (BENADRYL) 25 mg capsule Take 50 mg by mouth every 6 (six) hours as needed for allergies or itching.      hydrOXYzine (ATARAX/VISTARIL) 25 MG tablet Take 1 tablet (25 mg total) by mouth every 6 (six) hours as needed for itching. 30 tablet 0   LORazepam (ATIVAN) 0.5 MG tablet Take 0.5-1 tablets (0.25-0.5 mg total) by mouth at bedtime as needed for anxiety or sleep. 20 tablet 0   Multiple Vitamins-Calcium (ONE-A-DAY WOMENS FORMULA PO) Take 1 tablet by mouth daily.      Norethindrone Acetate-Ethinyl Estrad-FE (BLISOVI 24 FE) 1-20 MG-MCG(24) tablet Take 1 tablet by mouth daily.     polyethylene glycol (MIRALAX / GLYCOLAX) packet Take 17 g by mouth daily. As needed     propranolol (INDERAL) 10 MG tablet Take 1 tablet (10 mg total) by mouth 3 (three) times daily as needed. 30 tablet 1   triamcinolone (NASACORT) 55 MCG/ACT AERO nasal inhaler Place 2 sprays into the nose daily. 1 Inhaler    Vitamin D, Cholecalciferol, 10 MCG (400 UNIT) CAPS Take 1 capsule by mouth daily. 150 capsule    ondansetron (ZOFRAN) 4 MG tablet Take 1 tablet (4 mg total) by mouth every 8 (eight) hours as needed for nausea or vomiting. 20 tablet 0   No facility-administered medications prior to visit.     Per HPI unless specifically indicated in ROS section below Review of Systems  Constitutional:  Positive for fatigue. Negative for chills and fever.  HENT:  Positive for ear pain (intermittent) and sinus pressure.        Taste and smell distrubance  Respiratory:  Positive for cough and shortness of breath.   Cardiovascular:  Negative for chest pain.  Gastrointestinal:  Negative for diarrhea, nausea and vomiting.  Neurological:  Negative for headaches.  Objective:  Temp (!) 97.5 F (36.4 C) Comment: per patient  Wt Readings from Last 3 Encounters:  08/16/21 150 lb (68 kg)  07/20/21 154 lb 6 oz (70 kg)  07/12/21 152 lb 9.6 oz (69.2 kg)       Physical exam: Gen: alert, NAD, not ill appearing Pulm: speaks in complete sentences without increased work of breathing Psych: normal mood, normal thought content      Results for  orders placed or performed in visit on 08/23/21  POCT Influenza A/B  Result Value Ref Range   Influenza A, POC Negative Negative   Influenza B, POC Negative Negative   Assessment & Plan:   Problem List Items Addressed This Visit       Digestive   Thrush - Primary    Patient have concern for thrush.  States she is having a white discharge on the back of her tongue with a burning soreness sensation.  States she uses [employer]  type retainer and wakes up with "cottage cheese" residue.  We will go ahead and treat her for presumed thrush. Start nystatin oral 4 times daily for next 7 days.      Relevant Medications   nystatin (MYCOSTATIN) 100000 UNIT/ML suspension     Other   Cough    Patient recently diagnosed with COVID on 08/24/2021.  She had many questions regarding contagiousness.  All questions were answered to satisfaction.  Patient was concerned she is getting a secondary sinus infection.  Discussed with patient that viral illnesses can last 10 to 14 days.  We will continue to monitor at this point in time.  Patient is in agreement with this plan.  If she gets worse or fails to improve by Monday she will reach out to me via MyChart and we can discuss antibiotic use.  Continue to monitor also use over-the-counter treatments for symptom management.        No orders of the defined types were placed in this encounter.  No orders of the defined types were placed in this encounter.   I discussed the assessment and treatment plan with the patient. The patient was provided an opportunity to ask questions and all were answered. The patient agreed with the plan and demonstrated an understanding of the instructions. The patient was advised to call back or seek an in-person evaluation if the symptoms worsen or if the condition fails to improve as anticipated.  Follow up plan: No follow-ups on file.  Caroline Garret, NP

## 2021-09-01 NOTE — Assessment & Plan Note (Signed)
Patient have concern for thrush.  States she is having a white discharge on the back of her tongue with a burning soreness sensation.  States she uses [employer]  type retainer and wakes up with "cottage cheese" residue.  We will go ahead and treat her for presumed thrush. Start nystatin oral 4 times daily for next 7 days.

## 2021-09-01 NOTE — Assessment & Plan Note (Signed)
Patient recently diagnosed with COVID on 08/24/2021.  She had many questions regarding contagiousness.  All questions were answered to satisfaction.  Patient was concerned she is getting a secondary sinus infection.  Discussed with patient that viral illnesses can last 10 to 14 days.  We will continue to monitor at this point in time.  Patient is in agreement with this plan.  If she gets worse or fails to improve by Monday she will reach out to me via MyChart and we can discuss antibiotic use.  Continue to monitor also use over-the-counter treatments for symptom management.

## 2021-09-06 ENCOUNTER — Encounter: Payer: Self-pay | Admitting: Nurse Practitioner

## 2021-09-06 ENCOUNTER — Other Ambulatory Visit: Payer: Self-pay | Admitting: Nurse Practitioner

## 2021-09-06 DIAGNOSIS — B379 Candidiasis, unspecified: Secondary | ICD-10-CM

## 2021-09-06 DIAGNOSIS — J019 Acute sinusitis, unspecified: Secondary | ICD-10-CM

## 2021-09-06 MED ORDER — FLUCONAZOLE 150 MG PO TABS: 150.0000 mg | ORAL_TABLET | Freq: Once | ORAL | 0 refills | Status: AC

## 2021-09-06 MED ORDER — AMOXICILLIN 500 MG PO CAPS: 500.0000 mg | ORAL_CAPSULE | Freq: Three times a day (TID) | ORAL | 0 refills | Status: AC

## 2021-09-14 ENCOUNTER — Ambulatory Visit: Payer: BC Managed Care – PPO | Admitting: Family Medicine

## 2021-09-14 ENCOUNTER — Encounter: Payer: Self-pay | Admitting: Family Medicine

## 2021-09-14 ENCOUNTER — Other Ambulatory Visit: Payer: Self-pay

## 2021-09-14 VITALS — BP 116/70 | HR 85 | Temp 97.8°F | Ht 67.0 in | Wt 149.4 lb

## 2021-09-14 DIAGNOSIS — R2681 Unsteadiness on feet: Secondary | ICD-10-CM

## 2021-09-14 DIAGNOSIS — I479 Paroxysmal tachycardia, unspecified: Secondary | ICD-10-CM | POA: Diagnosis not present

## 2021-09-14 DIAGNOSIS — L509 Urticaria, unspecified: Secondary | ICD-10-CM

## 2021-09-14 DIAGNOSIS — J302 Other seasonal allergic rhinitis: Secondary | ICD-10-CM | POA: Diagnosis not present

## 2021-09-14 MED ORDER — PREDNISONE 20 MG PO TABS: ORAL_TABLET | ORAL | 0 refills | Status: DC

## 2021-09-14 NOTE — Patient Instructions (Addendum)
Take short steroid taper for recent symptoms after covid.  If ongoing hives let me know. If ongoing joint/palm swelling, let me know to consider labwork.  Keep food diary to see if any specific food trigger for your symptoms.  Eight foods that can be major food allergens: milk, eggs, fish, Crustacean shellfish, tree nuts, peanuts, wheat, and soybeans.

## 2021-09-14 NOTE — Progress Notes (Signed)
Patient ID: Caroline [name], female    DOB: May 25, 1991, 30 y.o.   MRN: [id number]  This visit was conducted in person.  BP 116/70   Pulse 85   Temp 97.8 F (36.6 C) (Temporal)   Ht 5\' 7"  (1.702 m)   Wt 149 lb 6 oz (67.8 kg)   LMP 08/21/2021   SpO2 99%   BMI 23.40 kg/m   BP Readings from Last 3 Encounters:  09/14/21 116/70  08/16/21 (!) 100/50  07/20/21 108/78    CC: hives  Subjective:   HPI: [name] is a 30 y.o. female presenting on 09/14/2021 for Urticaria (C/o itchy hives, bilateral feet swelling and hand tenderness. )   COVID illness 08/22/2021, symptoms largely resolved.  She did have thrush, treated with nystatin then diflucan.  She also received course of low dose amoxicillin for possible sinusitis after COVID.   Notes residual difficulty with decreased hearing, pressure in ears, R ear continually popping.   Notes some ongoing unsteadiness/imbalance with ambulation that started after COVID.  Notes some residual taste changes.  Ongoing joint pains since COVID.  Ongoing mental fog - notes she's more forgetful.  Cough - responding to nasacort - attributed to allergies.   Since COVID, also notes intermittent hives and itching and swelling to bilateral feet predominantly at soles, worse with walking - managing with benadryl. Continues taking zyrtec during the day with benadryl at night as needed.   Possible allergy to OxyCide cleaner used at healthcare facilities - this causes hives.   Established with Dr Rockey Situ cardiology for lightheadedness and paroxysmal tachycardia. Did not do well with metoprolol for tachycardia - trying propranolol 10mg  TID PRN.   Saw dentist yesterday with good report - has Invisalign.   Low B12 in the past. Previous rheum workup normal 11/2015 (ANA, RF, anti TTG, CPK, heavy metals) and again 07/2019.  Workup 07/2019 - ANA normal CPK negative [facility] spotted fever IgG and IgM negative Lyme IgG and IgM, normal serum B12 low vitamin D  29.7 thousand 16 - ANA negative rheumatoid factor     Relevant past medical, surgical, family and social history reviewed and updated as indicated. Interim medical history since our last visit reviewed. Allergies and medications reviewed and updated. Outpatient Medications Prior to Visit  Medication Sig Dispense Refill   ACZONE 7.5 % GEL Apply a small amount to affected area every morning     cetirizine (ZYRTEC) 10 MG tablet Take 10 mg by mouth daily as needed for allergies.     diphenhydrAMINE (BENADRYL) 25 mg capsule Take 50 mg by mouth every 6 (six) hours as needed for allergies or itching.     LORazepam (ATIVAN) 0.5 MG tablet Take 0.5-1 tablets (0.25-0.5 mg total) by mouth at bedtime as needed for anxiety or sleep. 20 tablet 0   Multiple Vitamins-Calcium (ONE-A-DAY WOMENS FORMULA PO) Take 1 tablet by mouth daily.      Norethindrone Acetate-Ethinyl Estrad-FE (BLISOVI 24 FE) 1-20 MG-MCG(24) tablet Take 1 tablet by mouth daily.     polyethylene glycol (MIRALAX / GLYCOLAX) packet Take 17 g by mouth daily. As needed     propranolol (INDERAL) 10 MG tablet Take 1 tablet (10 mg total) by mouth 3 (three) times daily as needed. 30 tablet 1   triamcinolone (NASACORT) 55 MCG/ACT AERO nasal inhaler Place 2 sprays into the nose daily. 1 Inhaler    Vitamin D, Cholecalciferol, 10 MCG (400 UNIT) CAPS Take 1 capsule by mouth daily. 150 capsule  hydrOXYzine (ATARAX/VISTARIL) 25 MG tablet Take 1 tablet (25 mg total) by mouth every 6 (six) hours as needed for itching. 30 tablet 0   No facility-administered medications prior to visit.     Per HPI unless specifically indicated in ROS section below Review of Systems  Objective:  BP 116/70   Pulse 85   Temp 97.8 F (36.6 C) (Temporal)   Ht 5\' 7"  (1.702 m)   Wt 149 lb 6 oz (67.8 kg)   LMP 08/21/2021   SpO2 99%   BMI 23.40 kg/m   Wt Readings from Last 3 Encounters:  09/14/21 149 lb 6 oz (67.8 kg)  08/16/21 150 lb (68 kg)  07/20/21 154 lb 6 oz (70  kg)      Physical Exam Vitals and nursing note reviewed.  Constitutional:      Appearance: Normal appearance. She is not ill-appearing.  HENT:     Mouth/Throat:     Mouth: Mucous membranes are moist.     Pharynx: Oropharynx is clear. No oropharyngeal exudate or posterior oropharyngeal erythema.  Eyes:     Extraocular Movements: Extraocular movements intact.     Pupils: Pupils are equal, round, and reactive to light.  Neck:     Thyroid: No thyroid mass or thyromegaly.  Cardiovascular:     Rate and Rhythm: Normal rate and regular rhythm.     Pulses: Normal pulses.     Heart sounds: Normal heart sounds. No murmur heard. Pulmonary:     Effort: Pulmonary effort is normal. No respiratory distress.     Breath sounds: Normal breath sounds. No wheezing, rhonchi or rales.  Musculoskeletal:     Right lower leg: No edema.     Left lower leg: No edema.     Comments:  Tender swelling to palmar MCPs without significant erythema or warmth 2+ DP bilaterally  Skin:    General: Skin is warm and dry.     Findings: No rash. Rash is not urticarial.     Comments: No hives on exam  Neurological:     Mental Status: She is alert.  Psychiatric:        Mood and Affect: Mood normal.        Behavior: Behavior normal.       Assessment & Plan:  This visit occurred during the SARS-CoV-2 public health emergency.  Safety protocols were in place, including screening questions prior to the visit, additional usage of staff PPE, and extensive cleaning of exam room while observing appropriate contact time as indicated for disinfecting solutions.   Problem List Items Addressed This Visit     Seasonal allergic rhinitis    Continues zyrtec + nasacort with benefit.       Unsteadiness    Longstanding lightheadedness, possible worsening imbalance after COVID. Overall reassuring neurologic exam.       Paroxysmal tachycardia ([place])    Established with Dr Rockey Situ cardiology, trying propranolol PRN in place of  metoprolol.       Hives - Primary    Acute urticaria since COVID, predominantly on soles of feet affecting ambulation. ?COVID related, r/o food allergy vs physical/inducible urticaria. I suggested she start keeping a food diary.  Continue zyrtec + benadryl, Rx short prednisone taper. Consider adding singualir. Update if ongoing to consider further laboratory eval (SLE, RA, Sjogren, celiac disease, thyroid disease, other autoimmune disease - although has had normal eval in the past).  States she's had previous normal food allergy testing by allergist - no records available.  Consider tryptase r/o masytocytosis, consider return to allergist.         Meds ordered this encounter  Medications   predniSONE (DELTASONE) 20 MG tablet    Sig: Take two tablets daily for 3 days followed by one tablet daily for 4 days    Dispense:  10 tablet    Refill:  0    No orders of the defined types were placed in this encounter.    Patient Instructions  Take short steroid taper for recent symptoms after covid.  If ongoing hives let me know. If ongoing joint/palm swelling, let me know to consider labwork.  Keep food diary to see if any specific food trigger for your symptoms.  Eight foods that can be major food allergens: milk, eggs, fish, Crustacean shellfish, tree nuts, peanuts, wheat, and soybeans.   Follow up plan: Return if symptoms worsen or fail to improve.  [name], MD

## 2021-09-15 NOTE — Assessment & Plan Note (Signed)
Longstanding lightheadedness, possible worsening imbalance after COVID. Overall reassuring neurologic exam.

## 2021-09-15 NOTE — Assessment & Plan Note (Addendum)
Acute urticaria since COVID, predominantly on soles of feet affecting ambulation. ?COVID related, r/o food allergy vs physical/inducible urticaria. I suggested she start keeping a food diary.  Continue zyrtec + benadryl, Rx short prednisone taper. Consider adding singualir. Update if ongoing to consider further laboratory eval (SLE, RA, Sjogren, celiac disease, thyroid disease, other autoimmune disease - although has had normal eval in the past).  States she's had previous normal food allergy testing by allergist - no records available.  Consider tryptase r/o masytocytosis, consider return to allergist.

## 2021-09-15 NOTE — Assessment & Plan Note (Signed)
Continues zyrtec + nasacort with benefit.

## 2021-09-15 NOTE — Assessment & Plan Note (Signed)
Established with Dr Rockey Situ cardiology, trying propranolol PRN in place of metoprolol.

## 2021-09-23 DIAGNOSIS — K5900 Constipation, unspecified: Secondary | ICD-10-CM | POA: Diagnosis not present

## 2021-09-23 DIAGNOSIS — N83209 Unspecified ovarian cyst, unspecified side: Secondary | ICD-10-CM | POA: Diagnosis not present

## 2021-09-28 ENCOUNTER — Ambulatory Visit: Payer: BC Managed Care – PPO | Admitting: Family Medicine

## 2021-09-28 ENCOUNTER — Other Ambulatory Visit: Payer: Self-pay

## 2021-09-28 ENCOUNTER — Encounter: Payer: Self-pay | Admitting: Family Medicine

## 2021-09-28 VITALS — BP 120/82 | HR 78 | Temp 97.7°F | Ht 67.0 in | Wt 152.0 lb

## 2021-09-28 DIAGNOSIS — R5382 Chronic fatigue, unspecified: Secondary | ICD-10-CM

## 2021-09-28 DIAGNOSIS — L509 Urticaria, unspecified: Secondary | ICD-10-CM | POA: Diagnosis not present

## 2021-09-28 DIAGNOSIS — M533 Sacrococcygeal disorders, not elsewhere classified: Secondary | ICD-10-CM | POA: Diagnosis not present

## 2021-09-28 DIAGNOSIS — M7989 Other specified soft tissue disorders: Secondary | ICD-10-CM | POA: Diagnosis not present

## 2021-09-28 DIAGNOSIS — I951 Orthostatic hypotension: Secondary | ICD-10-CM | POA: Diagnosis not present

## 2021-09-28 DIAGNOSIS — G8929 Other chronic pain: Secondary | ICD-10-CM

## 2021-09-28 DIAGNOSIS — R202 Paresthesia of skin: Secondary | ICD-10-CM

## 2021-09-28 NOTE — Patient Instructions (Addendum)
We will watch hand swelling - expect it to improve over time.  Will repeat labwork through [place] looking for autoimmune cause.  We will request MRI from [place] ([facility]) from early 2022.
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
## 2021-09-29 ENCOUNTER — Ambulatory Visit: Payer: BC Managed Care – PPO | Admitting: Podiatry

## 2021-09-29 ENCOUNTER — Ambulatory Visit (INDEPENDENT_AMBULATORY_CARE_PROVIDER_SITE_OTHER): Payer: BC Managed Care – PPO

## 2021-09-29 DIAGNOSIS — M7989 Other specified soft tissue disorders: Secondary | ICD-10-CM | POA: Insufficient documentation

## 2021-09-29 DIAGNOSIS — M722 Plantar fascial fibromatosis: Secondary | ICD-10-CM

## 2021-09-29 DIAGNOSIS — M205X9 Other deformities of toe(s) (acquired), unspecified foot: Secondary | ICD-10-CM | POA: Diagnosis not present

## 2021-09-29 DIAGNOSIS — R2681 Unsteadiness on feet: Secondary | ICD-10-CM

## 2021-09-29 DIAGNOSIS — R29898 Other symptoms and signs involving the musculoskeletal system: Secondary | ICD-10-CM

## 2021-09-29 DIAGNOSIS — M779 Enthesopathy, unspecified: Secondary | ICD-10-CM

## 2021-09-29 NOTE — Assessment & Plan Note (Addendum)
Localized swelling of R>L hands predominantly at palmar surface soft tissue between MCPJs, not associated with redness, warmth. Not true joint inflammation. Will start with labwork, consider return to rheum (last saw Caroline [name] 02/2020 with normal evaluation, plan was 72yr f/u visit). No h/o psoriasis.

## 2021-09-29 NOTE — Assessment & Plan Note (Addendum)
Describes worst intolerance with prolonged standing - able to go on 20 min walks and tolerates that well, but finds she cannot stand for longer than 5-9 min at a time due to lightheadedness, fatigue. No signs of joint hypermobility or skin laxity on exam - points against Ehler Danlos Syndrome ?CFS.  Consider referral to PT at [facility] for multidisciplinary approach

## 2021-09-29 NOTE — Assessment & Plan Note (Addendum)
This has seemed to resolve. Prednisone may have helped.  She finds zyrtec + benadryl better than zyrtec bid.  She kept a food diary and didn't find causative food for hives.  ?COVID related as getting better on its own
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
## 2021-09-29 NOTE — Assessment & Plan Note (Deleted)
Describes worst intolerance when standing - able to go on 20 min walks and tolerates that well, but finds she cannot stand for longer than 5-9 min at a time due to lightheadedness, fatigue.

## 2021-09-29 NOTE — Assessment & Plan Note (Addendum)
Vitamin b12 levels normal when checked last year - update levels along with vit B1 and copper levels.  Discussed how description of symptoms not consistent with anatomical cause (entire L body numbness that spares soles).  Non-focal neurological exam with preserved strength on testing.  Reviewed recent imaging through her [facility] My[facility] account - overall reassuring MRI cervical spine from late 2021 as well as prior lumbar and head imaging. Consider return to PT to work on noted orthostatic intolerance as well as generalized conditioning.  I did ask her to bring in copy of all [facility] imaging to update her chart.

## 2021-10-03 DIAGNOSIS — R202 Paresthesia of skin: Secondary | ICD-10-CM | POA: Diagnosis not present

## 2021-10-03 DIAGNOSIS — R5382 Chronic fatigue, unspecified: Secondary | ICD-10-CM | POA: Diagnosis not present

## 2021-10-03 DIAGNOSIS — I951 Orthostatic hypotension: Secondary | ICD-10-CM | POA: Diagnosis not present

## 2021-10-03 DIAGNOSIS — M7989 Other specified soft tissue disorders: Secondary | ICD-10-CM | POA: Diagnosis not present

## 2021-10-07 ENCOUNTER — Other Ambulatory Visit: Payer: Self-pay | Admitting: Family Medicine

## 2021-10-07 ENCOUNTER — Encounter: Payer: Self-pay | Admitting: Family Medicine

## 2021-10-07 DIAGNOSIS — E538 Deficiency of other specified B group vitamins: Secondary | ICD-10-CM | POA: Insufficient documentation

## 2021-10-07 DIAGNOSIS — R7879 Finding of abnormal level of heavy metals in blood: Secondary | ICD-10-CM

## 2021-10-07 HISTORY — DX: Finding of abnormal level of heavy metals in blood: R78.79

## 2021-10-07 MED ORDER — VITAMIN B-12 1000 MCG PO TABS: 1000.0000 ug | ORAL_TABLET | Freq: Every day | ORAL | Status: DC

## 2021-10-10 LAB — VITAMIN B12: Vitamin B-12: 256 pg/mL (ref 232–1245)

## 2021-10-10 LAB — RHEUMATOID FACTOR: Rheumatoid fact SerPl-aCnc: <10 IU/mL (ref <14.0)

## 2021-10-10 LAB — THYROID PEROXIDASE ANTIBODY: Thyroperoxidase Ab SerPl-aCnc: 13 IU/mL (ref 0–34)

## 2021-10-10 LAB — HLA-B27 ANTIGEN: HLA B27: NEGATIVE

## 2021-10-10 LAB — SEDIMENTATION RATE: Sed Rate: 15 mm/hr (ref 0–32)

## 2021-10-10 LAB — CYCLIC CITRUL PEPTIDE ANTIBODY, IGG/IGA: Cyclic Citrullin Peptide Ab: 2 units (ref 0–19)

## 2021-10-10 LAB — C-REACTIVE PROTEIN: CRP: 10 mg/L (ref 0–10)

## 2021-10-10 LAB — VITAMIN B1: Thiamine: 145.3 nmol/L (ref 66.5–200.0)

## 2021-10-10 LAB — ANA: Anti Nuclear Antibody (ANA): NEGATIVE

## 2021-10-10 LAB — CK: Total CK: 26 U/L — ABNORMAL LOW (ref 32–182)

## 2021-10-10 LAB — RNP ANTIBODIES: ENA RNP Ab: <0.2 AI (ref 0.0–0.9)

## 2021-10-10 LAB — COPPER, SERUM: Copper: 245 ug/dL — ABNORMAL HIGH (ref 80–158)

## 2021-10-18 ENCOUNTER — Other Ambulatory Visit: Payer: Self-pay | Admitting: Podiatry

## 2021-10-18 DIAGNOSIS — M722 Plantar fascial fibromatosis: Secondary | ICD-10-CM

## 2021-10-24 ENCOUNTER — Other Ambulatory Visit: Payer: Self-pay

## 2021-10-24 ENCOUNTER — Encounter: Payer: Self-pay | Admitting: Family Medicine

## 2021-10-24 ENCOUNTER — Telehealth: Payer: Self-pay | Admitting: *Deleted

## 2021-10-24 ENCOUNTER — Ambulatory Visit: Payer: BC Managed Care – PPO | Admitting: Family Medicine

## 2021-10-24 VITALS — BP 118/76 | HR 105 | Temp 97.6°F | Ht 67.0 in | Wt 151.4 lb

## 2021-10-24 DIAGNOSIS — E538 Deficiency of other specified B group vitamins: Secondary | ICD-10-CM | POA: Diagnosis not present

## 2021-10-24 DIAGNOSIS — M7989 Other specified soft tissue disorders: Secondary | ICD-10-CM

## 2021-10-24 DIAGNOSIS — R7879 Finding of abnormal level of heavy metals in blood: Secondary | ICD-10-CM

## 2021-10-24 MED ORDER — LORAZEPAM 0.5 MG PO TABS: 0.2500 mg | ORAL_TABLET | Freq: Every evening | ORAL | 0 refills | Status: DC | PRN

## 2021-10-24 NOTE — Telephone Encounter (Signed)
Left message not able to reach pt.

## 2021-10-24 NOTE — Progress Notes (Signed)
Patient ID: Caroline [name], female    DOB: 1991/10/17, 30 y.o.   MRN: [id number]  This visit was conducted in person.  BP 118/76   Pulse (!) 105   Temp 97.6 F (36.4 C) (Temporal)   Ht '5\' 7"'  (1.702 m)   Wt 151 lb 7 oz (68.7 kg)   LMP 10/07/2021   SpO2 98%   BMI 23.72 kg/m    CC: edema Subjective:   HPI: Caroline [name] is a 30 y.o. female presenting on 10/24/2021 for Edema (C/o ongoing hand swelling.  Also, wants to discuss recent lab results. )   68yo father just died 2 wks ago from PNA, colon cancer, pancreas cancer, also had afib, CHF, COPD and cirrhosis of liver. Long time drinker.   See prior notes for details.  Strange ongoing localized hand swelling to palmar MCPs bilaterally. Prednisone taper significantly helped. Thought possibly COVID related. Repeat autoimmune workup returned normal (ANA, ESR, HLAB27, RF, CRP, anti-RNP, anti-CCP, TPO).  Physical activity seems to flare hand swelling.  Previous hives have resolved. She was previously on zyrtec + benadryl, no longer.   Found to have elevated copper levels - doesn't have high copper diet. She does think her home may have copper pipes.   She's tried taking oral vitamin b12 1060mg - notes excessive fatigue and shaking.   Saw podiatry (Caroline [name] earlier this month - placed in new orthotics as well as planning to start gait training next week (Caroline [name]). Has upcoming ortho (Caroline [name]) appt next month.   [name]. Would be interested in re eval by rheum pending workup of above.      Relevant past medical, surgical, family and social history reviewed and updated as indicated. Interim medical history since our last visit reviewed. Allergies and medications reviewed and updated. Outpatient Medications Prior to Visit  Medication Sig Dispense Refill   cetirizine (ZYRTEC) 10 MG tablet Take 10 mg by mouth daily as needed for allergies.      diphenhydrAMINE (BENADRYL) 25 mg capsule Take 50 mg by mouth every 6 (six) hours as needed for allergies or itching.     Multiple Vitamins-Calcium (ONE-A-DAY WOMENS FORMULA PO) Take 1 tablet by mouth daily.      Norethindrone Acetate-Ethinyl Estrad-FE (BLISOVI 24 FE) 1-20 MG-MCG(24) tablet Take 1 tablet by mouth daily.     polyethylene glycol (MIRALAX / GLYCOLAX) packet Take 17 g by mouth daily. As needed     propranolol (INDERAL) 10 MG tablet Take 1 tablet (10 mg total) by mouth 3 (three) times daily as needed. 30 tablet 1   triamcinolone (NASACORT) 55 MCG/ACT AERO nasal inhaler Place 2 sprays into the nose daily. 1 Inhaler    vitamin B-12 (CYANOCOBALAMIN) 1000 MCG tablet Take 1 tablet (1,000 mcg total) by mouth daily.     Vitamin D, Cholecalciferol, 10 MCG (400 UNIT) CAPS Take 1 capsule by mouth daily. 150 capsule    LORazepam (ATIVAN) 0.5 MG tablet Take 0.5-1 tablets (0.25-0.5 mg total) by mouth at bedtime as needed for anxiety or sleep. 20 tablet 0   No facility-administered medications prior to visit.     Per HPI unless specifically indicated in ROS section below Review of Systems  Objective:  BP 118/76   Pulse (!) 105   Temp 97.6 F (36.4 C) (Temporal)   Ht '5\' 7"'  (1.702 m)   Wt 151 lb 7 oz (68.7 kg)   LMP 10/07/2021   SpO2  98%   BMI 23.72 kg/m   Wt Readings from Last 3 Encounters:  10/24/21 151 lb 7 oz (68.7 kg)  09/28/21 152 lb (68.9 kg)  09/14/21 149 lb 6 oz (67.8 kg)      Physical Exam Vitals and nursing note reviewed.  Constitutional:      Appearance: Normal appearance. She is not ill-appearing.  Cardiovascular:     Rate and Rhythm: Normal rate and regular rhythm.     Pulses: Normal pulses.     Heart sounds: Normal heart sounds. No murmur heard. Pulmonary:     Effort: Pulmonary effort is normal. No respiratory distress.     Breath sounds: Normal breath sounds. No wheezing, rhonchi or rales.  Musculoskeletal:        General: Swelling present.     Comments: Mild  swelling to R palmar hand at MCPs predominantly lateral to 2nd   Skin:    General: Skin is warm and dry.     Findings: No rash.  Neurological:     Mental Status: She is alert.      Results for orders placed or performed in visit on 09/28/21  Vitamin B1  Result Value Ref Range   Thiamine 145.3 66.5 - 200.0 nmol/L  Copper, Serum  Result Value Ref Range   Copper 245 (H) 80 - 158 ug/dL  Thyroid peroxidase antibody  Result Value Ref Range   Thyroperoxidase Ab SerPl-aCnc 13 0 - 34 IU/mL  CYCLIC CITRUL PEPTIDE ANTIBODY, IGG/IGA  Result Value Ref Range   Cyclic Citrullin Peptide Ab 2 0 - 19 units  RNP Antibodies  Result Value Ref Range   ENA RNP Ab <0.2 0.0 - 0.9 AI  C-reactive protein  Result Value Ref Range   CRP 10 0 - 10 mg/L  Rheumatoid factor  Result Value Ref Range   Rhuematoid fact SerPl-aCnc <10.0 <14.0 IU/mL  HLA-B27 antigen  Result Value Ref Range   HLA B27 Negative   Sedimentation rate  Result Value Ref Range   Sed Rate 15 0 - 32 mm/hr  ANA  Result Value Ref Range   Anti Nuclear Antibody (ANA) Negative Negative  Vitamin B12  Result Value Ref Range   Vitamin B-12 256 232 - 1,245 pg/mL  CK  Result Value Ref Range   Total CK 26 (L) 32 - 182 U/L    Assessment & Plan:  This visit occurred during the SARS-CoV-2 public health emergency.  Safety protocols were in place, including screening questions prior to the visit, additional usage of staff PPE, and extensive cleaning of exam room while observing appropriate contact time as indicated for disinfecting solutions.   Problem List Items Addressed This Visit     Hand swelling    Intermittent, ongoing. Prednisone previously helped. Recent autoimmune workup normal.       High blood copper level - Primary    Found on recent labs. Denies significant ingestion of high copper foods. No evident kayser-fletcher rings on eye exam. Will check 24hr urine copper as well as ceruloplasmin levels to further evaluate for Wilson's  disease. No known fmhx copper excess. Discussed possibly starting zinc supplement 50-162m daily to counteract high copper levels, consider testing water sources for copper levels.       Relevant Orders   Copper, Urine - Random or 24 hour   Vitamin B12 deficiency    Check MMA, HC with next labwork.  She has started vitamin B12 replacement 10082m but has felt bad with this - suggested drop dose  to 557mg replacement and consider MWF dosing.         Meds ordered this encounter  Medications   LORazepam (ATIVAN) 0.5 MG tablet    Sig: Take 0.5-1 tablets (0.25-0.5 mg total) by mouth at bedtime as needed for anxiety or sleep.    Dispense:  20 tablet    Refill:  0    Orders Placed This Encounter  Procedures   Copper, Urine - Random or 24 hour    24 hour please    Standing Status:   Future    Number of Occurrences:   1    Standing Expiration Date:   10/24/2022    Patient Instructions  Further labs through [facility] including urine test.  Pending results may consider getting copper levels checked in your water and/or zinc supplementation (50-1010mdaily).   Follow up plan: No follow-ups on file.  JaRia BushMD

## 2021-10-24 NOTE — Patient Instructions (Addendum)
Further labs through [facility] including urine test.  Pending results may consider getting copper levels checked in your water and/or zinc supplementation (50-100mg  daily).

## 2021-10-25 NOTE — Assessment & Plan Note (Addendum)
Found on recent labs. Denies significant ingestion of high copper foods. No evident kayser-fletcher rings on eye exam. Will check 24hr urine copper as well as ceruloplasmin levels to further evaluate for Wilson's disease. No known fmhx copper excess. Discussed possibly starting zinc supplement 50-100mg  daily to counteract high copper levels, consider testing water sources for copper levels.

## 2021-10-25 NOTE — Assessment & Plan Note (Signed)
Intermittent, ongoing. Prednisone previously helped. Recent autoimmune workup normal.

## 2021-10-25 NOTE — Assessment & Plan Note (Signed)
Check MMA, HC with next labwork.  She has started vitamin B12 replacement 1099mcg but has felt bad with this - suggested drop dose to 510mcg replacement and consider MWF dosing.

## 2021-10-28 ENCOUNTER — Other Ambulatory Visit: Payer: Self-pay

## 2021-10-28 ENCOUNTER — Ambulatory Visit (INDEPENDENT_AMBULATORY_CARE_PROVIDER_SITE_OTHER): Payer: BC Managed Care – PPO | Admitting: Podiatry

## 2021-10-28 DIAGNOSIS — M205X9 Other deformities of toe(s) (acquired), unspecified foot: Secondary | ICD-10-CM | POA: Diagnosis not present

## 2021-10-28 DIAGNOSIS — R2681 Unsteadiness on feet: Secondary | ICD-10-CM | POA: Diagnosis not present

## 2021-10-28 DIAGNOSIS — M722 Plantar fascial fibromatosis: Secondary | ICD-10-CM | POA: Diagnosis not present

## 2021-10-31 ENCOUNTER — Telehealth: Payer: Self-pay | Admitting: Family Medicine

## 2021-10-31 ENCOUNTER — Encounter: Payer: Self-pay | Admitting: Family Medicine

## 2021-10-31 DIAGNOSIS — R7879 Finding of abnormal level of heavy metals in blood: Secondary | ICD-10-CM

## 2021-10-31 DIAGNOSIS — E538 Deficiency of other specified B group vitamins: Secondary | ICD-10-CM | POA: Diagnosis not present

## 2021-10-31 NOTE — Progress Notes (Signed)
Subjective: 30 year old female presents the office to pick up orthotics.  She has no new concerns otherwise.  Objective: AAO x3, NAD DP/PT pulses palpable bilaterally, CRT less than 3 seconds Overall exam is unchanged.  Rectus foot type.  Adductovarus of the fifth digits.  There is no area of significant pinpoint tenderness identified today.  No pain with calf compression, swelling, warmth, erythema  Assessment: 30 year old female with gait instability, plantar fasciitis, adductovarus fifth digits  Plan: -All treatment options discussed with the patient including all alternatives, risks, complications.  -Orthotics were dispensed today.  Both oral and written break-in instructions provided to the patient.  Discussed that she needs any modifications to let us know and we would be happy to work on them for her.  -States that she has not started physical therapy after she gets use to the orthotics. -Patient encouraged to call the office with any questions, concerns, change in symptoms.   Trula Slade DPM

## 2021-10-31 NOTE — Telephone Encounter (Signed)
Also, see phn note, 10/31/21

## 2021-10-31 NOTE — Telephone Encounter (Signed)
Pt called stating that she was concerned that the urine container sample may be to small. Pt states that she didn't know the restriction, but she has had coffee. Pt wants to know why you cant pee in the container. Pt would like a call back.

## 2021-10-31 NOTE — Telephone Encounter (Signed)
Pt called Man office. I spoke to Dr. Darnell Level and pt was advised no restrictions & not to collect first urine of the day. Pt was advised to contact [facility] for additional container if she feels she will void more than 3074ml.

## 2021-11-01 DIAGNOSIS — R7879 Finding of abnormal level of heavy metals in blood: Secondary | ICD-10-CM | POA: Diagnosis not present

## 2021-11-01 NOTE — Telephone Encounter (Signed)
Caroline [name] from [facility] calling about status of order need it by 3:30 today

## 2021-11-01 NOTE — Telephone Encounter (Signed)
Caroline [name]  with [facility] called stating that she need that order today before 1:00.

## 2021-11-01 NOTE — Telephone Encounter (Signed)
I've reordered.

## 2021-11-01 NOTE — Telephone Encounter (Signed)
Caroline [name] from [facility] called stating that she need the order for the urine sample to be reorder.

## 2021-11-01 NOTE — Addendum Note (Signed)
Addended by: [name] on: 11/01/2021 01:20 PM   Modules accepted: Orders

## 2021-11-03 DIAGNOSIS — M545 Low back pain, unspecified: Secondary | ICD-10-CM | POA: Diagnosis not present

## 2021-11-03 DIAGNOSIS — M6281 Muscle weakness (generalized): Secondary | ICD-10-CM | POA: Diagnosis not present

## 2021-11-03 DIAGNOSIS — M546 Pain in thoracic spine: Secondary | ICD-10-CM | POA: Diagnosis not present

## 2021-11-03 DIAGNOSIS — R293 Abnormal posture: Secondary | ICD-10-CM | POA: Diagnosis not present

## 2021-11-03 LAB — COPPER, URINE - RANDOM OR 24 HOUR
Copper / Creatinine Ratio: 10 ug/g creat (ref 0–49)
Copper, 24H Ur: 8 ug/24 hr (ref 3–35)
Copper, Ur: 3 ug/L
Creatinine(Crt),U: 0.31 g/L (ref 0.30–3.00)

## 2021-11-04 LAB — CERULOPLASMIN: Ceruloplasmin: 52.6 mg/dL — ABNORMAL HIGH (ref 19.0–39.0)

## 2021-11-04 LAB — METHYLMALONIC ACID, SERUM: Methylmalonic Acid: 155 nmol/L (ref 0–378)

## 2021-11-04 LAB — HOMOCYSTEINE: Homocysteine: 8.4 umol/L (ref 0.0–14.5)

## 2021-11-07 ENCOUNTER — Encounter: Payer: Self-pay | Admitting: Family Medicine

## 2021-11-10 ENCOUNTER — Encounter: Payer: BC Managed Care – PPO | Admitting: Dermatology

## 2021-11-15 DIAGNOSIS — M545 Low back pain, unspecified: Secondary | ICD-10-CM | POA: Diagnosis not present

## 2021-11-15 DIAGNOSIS — M6281 Muscle weakness (generalized): Secondary | ICD-10-CM | POA: Diagnosis not present

## 2021-11-15 DIAGNOSIS — R293 Abnormal posture: Secondary | ICD-10-CM | POA: Diagnosis not present

## 2021-11-15 DIAGNOSIS — M546 Pain in thoracic spine: Secondary | ICD-10-CM | POA: Diagnosis not present

## 2021-11-18 DIAGNOSIS — R293 Abnormal posture: Secondary | ICD-10-CM | POA: Diagnosis not present

## 2021-11-18 DIAGNOSIS — M545 Low back pain, unspecified: Secondary | ICD-10-CM | POA: Diagnosis not present

## 2021-11-18 DIAGNOSIS — M6281 Muscle weakness (generalized): Secondary | ICD-10-CM | POA: Diagnosis not present

## 2021-11-18 DIAGNOSIS — M546 Pain in thoracic spine: Secondary | ICD-10-CM | POA: Diagnosis not present

## 2021-11-22 DIAGNOSIS — M546 Pain in thoracic spine: Secondary | ICD-10-CM | POA: Diagnosis not present

## 2021-11-22 DIAGNOSIS — M6281 Muscle weakness (generalized): Secondary | ICD-10-CM | POA: Diagnosis not present

## 2021-11-22 DIAGNOSIS — R293 Abnormal posture: Secondary | ICD-10-CM | POA: Diagnosis not present

## 2021-11-22 DIAGNOSIS — M545 Low back pain, unspecified: Secondary | ICD-10-CM | POA: Diagnosis not present

## 2021-11-25 DIAGNOSIS — M545 Low back pain, unspecified: Secondary | ICD-10-CM | POA: Diagnosis not present

## 2021-11-25 DIAGNOSIS — M6281 Muscle weakness (generalized): Secondary | ICD-10-CM | POA: Diagnosis not present

## 2021-11-25 DIAGNOSIS — R293 Abnormal posture: Secondary | ICD-10-CM | POA: Diagnosis not present

## 2021-11-25 DIAGNOSIS — M546 Pain in thoracic spine: Secondary | ICD-10-CM | POA: Diagnosis not present

## 2021-12-12 DIAGNOSIS — M5030 Other cervical disc degeneration, unspecified cervical region: Secondary | ICD-10-CM | POA: Diagnosis not present

## 2021-12-12 DIAGNOSIS — M5136 Other intervertebral disc degeneration, lumbar region: Secondary | ICD-10-CM | POA: Diagnosis not present

## 2021-12-27 ENCOUNTER — Encounter: Payer: Self-pay | Admitting: Dermatology

## 2021-12-27 ENCOUNTER — Other Ambulatory Visit: Payer: Self-pay

## 2021-12-27 ENCOUNTER — Ambulatory Visit: Payer: BC Managed Care – PPO | Admitting: Dermatology

## 2021-12-27 DIAGNOSIS — D1801 Hemangioma of skin and subcutaneous tissue: Secondary | ICD-10-CM

## 2021-12-27 DIAGNOSIS — L650 Telogen effluvium: Secondary | ICD-10-CM | POA: Diagnosis not present

## 2021-12-27 DIAGNOSIS — L578 Other skin changes due to chronic exposure to nonionizing radiation: Secondary | ICD-10-CM | POA: Diagnosis not present

## 2021-12-27 DIAGNOSIS — L821 Other seborrheic keratosis: Secondary | ICD-10-CM

## 2021-12-27 DIAGNOSIS — Z1283 Encounter for screening for malignant neoplasm of skin: Secondary | ICD-10-CM

## 2021-12-27 DIAGNOSIS — Z86018 Personal history of other benign neoplasm: Secondary | ICD-10-CM

## 2021-12-27 DIAGNOSIS — L858 Other specified epidermal thickening: Secondary | ICD-10-CM

## 2021-12-27 DIAGNOSIS — L814 Other melanin hyperpigmentation: Secondary | ICD-10-CM

## 2021-12-27 DIAGNOSIS — K130 Diseases of lips: Secondary | ICD-10-CM | POA: Diagnosis not present

## 2021-12-27 DIAGNOSIS — D229 Melanocytic nevi, unspecified: Secondary | ICD-10-CM

## 2021-12-27 MED ORDER — HYDROCORTISONE 2.5 % EX OINT: TOPICAL_OINTMENT | CUTANEOUS | 1 refills | Status: DC

## 2021-12-27 NOTE — Patient Instructions (Addendum)
Telogen effluvium is a benign, self limited condition causing increased hair shedding usually for several months. It does not progress to baldness, and the hair eventually grows back on its own. It can be triggered by recent illness, recent surgery, thyroid disease, low iron stores, vitamin D deficiency, fad diets or rapid weight loss, hormonal changes such as pregnancy or birth control pills, and some medication. Usually the hair loss starts 2-3 months after the illness or health change. Rarely, it can continue for longer than a year.  Recommend consider rechecking vitamin D to be sure still in normal range. Optimally for hair regrowth we want level 50 or higher. Recommend check of ferritin (iron stores). Recommend be sure no thyroid disease.  Recommend minoxidil 5% (Rogaine for men) solution or foam to be applied to the scalp and left in. This should ideally be used twice daily for best results but it helps with hair regrowth when used at least three times per week. Rogaine initially can cause increased hair shedding for the first few weeks but this will stop with continued use. In studies, people who used minoxidil (Rogaine) for at least 6 months had thicker hair than people who did not. Minoxidil topical (Rogaine) only works as long as it continues to be used. If if it is no longer used then the hair it has been helping to regrow can fall out. Minoxidil topical (Rogaine) can cause increased facial hair growth which can usually be managed easily with a battery-operated hair trimmer. If facial hair growth is bothersome, switching to the 2% women's version can decrease the risk of unwanted facial hair growth.   Melanoma ABCDEs  Melanoma is the most dangerous type of skin cancer, and is the leading cause of death from skin disease.  You are more likely to develop melanoma if you: Have light-colored skin, light-colored eyes, or red or blond hair Spend a lot of time in the sun Tan regularly, either  outdoors or in a tanning bed Have had blistering sunburns, especially during childhood Have a close family member who has had a melanoma Have atypical moles or large birthmarks  Early detection of melanoma is key since treatment is typically straightforward and cure rates are extremely high if we catch it early.   The first sign of melanoma is often a change in a mole or a new dark spot.  The ABCDE system is a way of remembering the signs of melanoma.  A for asymmetry:  The two halves do not match. B for border:  The edges of the growth are irregular. C for color:  A mixture of colors are present instead of an even brown color. D for diameter:  Melanomas are usually (but not always) greater than 86mm - the size of a pencil eraser. E for evolution:  The spot keeps changing in size, shape, and color.  Please check your skin once per month between visits. You can use a small mirror in front and a large mirror behind you to keep an eye on the back side or your body.   If you see any new or changing lesions before your next follow-up, please call to schedule a visit.  Please continue daily skin protection including broad spectrum sunscreen SPF 30+ to sun-exposed areas, reapplying every 2 hours as needed when you're outdoors.   Staying in the shade or wearing long sleeves, sun glasses (UVA+UVB protection) and wide brim hats (4-inch brim around the entire circumference of the hat) are also recommended for sun  protection.   Start Hydrocortisone 2.5% oint twice daily up to 2 weeks as needed. Stop Neosporin, Nivea.   Topical steroids (such as triamcinolone, fluocinolone, fluocinonide, mometasone, clobetasol, halobetasol, betamethasone, hydrocortisone) can cause thinning and lightening of the skin if they are used for too long in the same area. Your physician has selected the right strength medicine for your problem and area affected on the body. Please use your medication only as directed by your  physician to prevent side effects.    Recommend Alastin eye cream as directed.

## 2021-12-27 NOTE — Progress Notes (Signed)
Follow-Up Visit   Subjective  Caroline [name] is a 31 y.o. female who presents for the following: Annual Exam (Hx DN. Left upper abdomen. Mild atypia. Larger mole right posterior neck. Copper toxicity in blood, taking zinc and B12. C/O dry lips and dark circles under eyes. C/O blackheads on nose and crows feet at eyes and dark circles around eyes. Noticed increased hair loss since October 2022). TSH WNL per patient. Hx of COVID in August 2022.  Patient here for full body skin exam and skin cancer screening.   The following portions of the chart were reviewed this encounter and updated as appropriate:  Tobacco   Allergies   Meds   Problems   Med Hx   Surg Hx   Fam Hx       Review of Systems: No other skin or systemic complaints except as noted in HPI or Assessment and Plan.   Objective  Well appearing patient in no apparent distress; mood and affect are within normal limits.  A full examination was performed including scalp, head, eyes, ears, nose, lips, neck, chest, axillae, abdomen, back, buttocks, bilateral upper extremities, bilateral lower extremities, hands, feet, fingers, toes, fingernails, and toenails. All findings within normal limits unless otherwise noted below.  Scalp Diffuse thinning of hair, positive hair pull test.   lips Erythema with xerosis and peeling   Assessment & Plan  Telogen effluvium Scalp  Telogen effluvium is a benign, self limited condition causing increased hair shedding usually for several months. It does not progress to baldness, and the hair eventually grows back on its own. It can be triggered by recent illness, recent surgery, thyroid disease, low iron stores, vitamin D deficiency, fad diets or rapid weight loss, hormonal changes such as pregnancy or birth control pills, and some medication. Usually the hair loss starts 2-3 months after the illness or health change. Rarely, it can continue for longer than a year.  She has copper overload with no  known cause and history of vitamin D deficiency, no longer consistently supplementing since level returned to normal earlier in the year. She sees PCP tomorrow.   Reviewed labs vitamin D, TSH, ANA, ESR, CBC with diff  Recommend consider rechecking vitamin D to be sure in normal range. Optimally for hair regrowth we want level 50 or higher. Also recommend check of ferritin (iron stores) and to be sure no thyroid issues (copper overload could affect conversion of thyroid hormone) as these can all cause telogen effluvium.   Recommend minoxidil 5% (Rogaine for men) solution or foam to be applied to the scalp and left in. This should ideally be used twice daily for best results but it helps with hair regrowth when used at least three times per week. Rogaine initially can cause increased hair shedding for the first few weeks but this will stop with continued use. In studies, people who used minoxidil (Rogaine) for at least 6 months had thicker hair than people who did not. Minoxidil topical (Rogaine) only works as long as it continues to be used. If if it is no longer used then the hair it has been helping to regrow can fall out. Minoxidil topical (Rogaine) can cause increased facial hair growth which can usually be managed easily with a battery-operated hair trimmer. If facial hair growth is bothersome, switching to the 2% women's version can decrease the risk of unwanted facial hair growth.  Call in 3 months if not improving.   Cheilitis lips  Stop Neosporin, Nivea.  Start HC 2.5% oint BID up to 2 weeks PRN. Use vaseline only for moisturizing due to risk of contact dermatitis.   Topical steroids (such as triamcinolone, fluocinolone, fluocinonide, mometasone, clobetasol, halobetasol, betamethasone, hydrocortisone) can cause thinning and lightening of the skin if they are used for too long in the same area. Your physician has selected the right strength medicine for your problem and area affected on the  body. Please use your medication only as directed by your physician to prevent side effects.    hydrocortisone 2.5 % ointment - lips Apply BID to lips up to 2 weeks PRN   Lentigines - Scattered tan macules - Due to sun exposure - Benign-appearing, observe - Recommend daily broad spectrum sunscreen SPF 30+ to sun-exposed areas, reapply every 2 hours as needed. - Call for any changes  Seborrheic Keratoses - Stuck-on, waxy, tan-brown papules and/or plaques  - Benign-appearing - Discussed benign etiology and prognosis. - Observe - Call for any changes  Melanocytic Nevi - Tan-brown and/or pink-flesh-colored symmetric macules and papules - Benign appearing on exam today - Observation - Call clinic for new or changing moles - Recommend daily use of broad spectrum spf 30+ sunscreen to sun-exposed areas.   Hemangiomas - Red papules - Discussed benign nature - Observe - Call for any changes  Actinic Damage - Chronic condition, secondary to cumulative UV/sun exposure - diffuse scaly erythematous macules with underlying dyspigmentation - Recommend daily broad spectrum sunscreen SPF 30+ to sun-exposed areas, reapply every 2 hours as needed.  - Staying in the shade or wearing long sleeves, sun glasses (UVA+UVB protection) and wide brim hats (4-inch brim around the entire circumference of the hat) are also recommended for sun protection.  - Call for new or changing lesions.  Skin cancer screening performed today.  History of Dysplastic Nevi - No evidence of recurrence today at left upper abdomen. - Recommend regular full body skin exams - Recommend daily broad spectrum sunscreen SPF 30+ to sun-exposed areas, reapply every 2 hours as needed.  - Call if any new or changing lesions are noted between office visits   Keratosis Pilaris - Tiny follicular keratotic papules - Benign. Genetic in nature. No cure. - Observe. - If desired, patient can use an emollient (moisturizer)  containing ammonium lactate, urea or salicylic acid once a day to smooth the area AmLactin lotion samples given.  Return for TBSE in 1 year.  I, Emelia Salisbury, CMA, am acting as scribe for Forest Gleason, MD.  Documentation: I have reviewed the above documentation for accuracy and completeness, and I agree with the above.  Forest Gleason, MD

## 2021-12-28 ENCOUNTER — Encounter: Payer: Self-pay | Admitting: Family Medicine

## 2021-12-28 ENCOUNTER — Telehealth (INDEPENDENT_AMBULATORY_CARE_PROVIDER_SITE_OTHER): Payer: BC Managed Care – PPO | Admitting: Family Medicine

## 2021-12-28 VITALS — Temp 97.4°F | Ht 67.0 in | Wt 152.0 lb

## 2021-12-28 DIAGNOSIS — R7879 Finding of abnormal level of heavy metals in blood: Secondary | ICD-10-CM

## 2021-12-28 DIAGNOSIS — E538 Deficiency of other specified B group vitamins: Secondary | ICD-10-CM

## 2021-12-28 DIAGNOSIS — L650 Telogen effluvium: Secondary | ICD-10-CM

## 2021-12-28 DIAGNOSIS — E559 Vitamin D deficiency, unspecified: Secondary | ICD-10-CM | POA: Diagnosis not present

## 2021-12-28 MED ORDER — ZINC GLUCONATE 50 MG PO TABS: 50.0000 mg | ORAL_TABLET | Freq: Every day | ORAL | Status: DC

## 2021-12-28 MED ORDER — MULTIVITAMIN ADULT PO TABS: 1.0000 | ORAL_TABLET | Freq: Every day | ORAL | Status: AC

## 2021-12-28 NOTE — Progress Notes (Signed)
Patient ID: Caroline [name], female    DOB: 05-19-1991, 31 y.o.   MRN: [id number]  Virtual visit completed through [place], a video enabled telemedicine application. Due to national recommendations of social distancing due to COVID-19, a virtual visit is felt to be most appropriate for this patient at this time. Reviewed limitations, risks, security and privacy concerns of performing a virtual visit and the availability of in person appointments. I also reviewed that there may be a patient responsible charge related to this service. The patient agreed to proceed.   Patient location: home Provider location: [facility], office Persons participating in this virtual visit: patient, provider   If any vitals were documented, they were collected by patient at home unless specified below.    Temp (!) 97.4 F (36.3 C) (Oral)    Ht 5\' 7"  (1.702 m)    Wt 152 lb (68.9 kg)    BMI 23.81 kg/m    CC: follow up visit  Subjective:   HPI: Caroline [name] is a 31 y.o. female presenting on 12/28/2021 for Acute Visit (Sx update- losing a lot of hair, very tired and can not stay warm. Patient has started PT since last visit. Has developed deep dark color under eyes and nails are very fragile. Saw dermatology yesterday and confirmed her significant hair loss from hair pull test.  )   See prior note and labs for details. Found to have elevated copper levels in blood to 245 (normal range 80-158) 09/2021 - however ceruloplasmin and urine copper levels returned ok. B12 also low normal however homocysteine and MMA levels were normal. Unclear cause of elevated copper levels as she tested her water at home and copper wasn't elevated.   She started zinc 50mg  daily (11/10/2021) with initial significant improvement in energy levels but now notices fatigue settling in again.   Notes recent worsening hair loss over the last 2 months, very noticeable in the shower. Also noticing worsening lip dryness and cracks  as well as brittle nails.   Saw dermatologist yesterday - diagnosed with telogen effluvium and started on minoxidil 5% solution or foam to be applied to scalp daily.  Vitamin D levels recommended >50 for optimal hair growth.      Relevant past medical, surgical, family and social history reviewed and updated as indicated. Interim medical history since our last visit reviewed. Allergies and medications reviewed and updated. Outpatient Medications Prior to Visit  Medication Sig Dispense Refill   cetirizine (ZYRTEC) 10 MG tablet Take 10 mg by mouth daily as needed for allergies.     diphenhydrAMINE (BENADRYL) 25 mg capsule Take 50 mg by mouth every 6 (six) hours as needed for allergies or itching.     hydrocortisone 2.5 % ointment Apply BID to lips up to 2 weeks PRN 30 g 1   LORazepam (ATIVAN) 0.5 MG tablet Take 0.5-1 tablets (0.25-0.5 mg total) by mouth at bedtime as needed for anxiety or sleep. 20 tablet 0   Norethindrone Acetate-Ethinyl Estrad-FE (BLISOVI 24 FE) 1-20 MG-MCG(24) tablet Take 1 tablet by mouth daily.     polyethylene glycol (MIRALAX / GLYCOLAX) packet Take 17 g by mouth daily. As needed     triamcinolone (NASACORT) 55 MCG/ACT AERO nasal inhaler Place 2 sprays into the nose daily. 1 Inhaler    Multiple Vitamins-Calcium (ONE-A-DAY WOMENS FORMULA PO) Take 1 tablet by mouth daily.      vitamin B-12 (CYANOCOBALAMIN) 1000 MCG tablet Take 1 tablet (1,000 mcg total) by  mouth daily.     Vitamin D, Cholecalciferol, 10 MCG (400 UNIT) CAPS Take 1 capsule by mouth daily. 150 capsule    propranolol (INDERAL) 10 MG tablet Take 1 tablet (10 mg total) by mouth 3 (three) times daily as needed. (Patient not taking: Reported on 12/28/2021) 30 tablet 1   No facility-administered medications prior to visit.     Per HPI unless specifically indicated in ROS section below Review of Systems Objective:  Temp (!) 97.4 F (36.3 C) (Oral)    Ht 5\' 7"  (1.702 m)    Wt 152 lb (68.9 kg)    BMI 23.81 kg/m    Wt Readings from Last 3 Encounters:  12/28/21 152 lb (68.9 kg)  10/24/21 151 lb 7 oz (68.7 kg)  09/28/21 152 lb (68.9 kg)       Physical exam: Gen: alert, NAD, not ill appearing Pulm: speaks in complete sentences without increased work of breathing Psych: normal mood, normal thought content      Results for orders placed or performed in visit on 10/31/21  Copper, Urine - Random or 24 hour  Result Value Ref Range   Copper, Ur 3 Not Estab. ug/L   Creatinine(Crt),U 0.31 0.30 - 3.00 g/L   Copper / Creatinine Ratio 10 0 - 49 ug/g creat   Copper, 24H Ur 8 3 - 35 ug/24 hr   Assessment & Plan:   Problem List Items Addressed This Visit     Vitamin D deficiency    She is regular with 400 IU daily. Has had trouble tolerating higher dose. Goal vit D levels >50 per dermatology for optimal hair growth. Will repeat levels, I did suggest she increase to 800IU daily.       Relevant Orders   VITAMIN D 25 Hydroxy (Vit-D Deficiency, Fractures)   High blood copper level - Primary    Of unclear cause - doesn't have high copper diet, water copper levels were also normal at home, she doesn't drink water from metal container.  Tested negative for wilson's disease (high ceruloplasmin), no fmhx copper excess disorder.  Zinc treatment started with benefit but now initial symptoms recurring. Will recheck copper levels now she's on zinc 50+mg daily.       Relevant Orders   Copper, Serum   Vitamin B12 deficiency    Update levels. She hasn't been supplementing regularly.       Relevant Orders   Vitamin B12   Telogen effluvium    Diagnosed by derm.  Update TFTs and iron panel.       Relevant Orders   TSH   T4, free   IBC panel   Ferritin     Meds ordered this encounter  Medications   zinc gluconate 50 MG tablet    Sig: Take 1 tablet (50 mg total) by mouth daily.   Multiple Vitamin (MULTIVITAMIN ADULT) TABS    Sig: Take 1 tablet by mouth daily. Nature Made MVI for Her   Vitamin D,  Cholecalciferol, 10 MCG (400 UNIT) CAPS    Sig: Take 2 capsules by mouth daily.   vitamin B-12 (CYANOCOBALAMIN) 500 MCG tablet    Sig: Take 1 tablet (500 mcg total) by mouth daily.   Orders Placed This Encounter  Procedures   TSH    Standing Status:   Future    Number of Occurrences:   1    Standing Expiration Date:   12/28/2022   T4, free    Standing Status:   Future  Number of Occurrences:   1    Standing Expiration Date:   12/28/2022   VITAMIN D 25 Hydroxy (Vit-D Deficiency, Fractures)    Standing Status:   Future    Number of Occurrences:   1    Standing Expiration Date:   12/28/2022   IBC panel    Standing Status:   Future    Number of Occurrences:   1    Standing Expiration Date:   12/28/2022   Ferritin    Standing Status:   Future    Number of Occurrences:   1    Standing Expiration Date:   12/28/2022   Copper, Serum    Standing Status:   Future    Number of Occurrences:   1    Standing Expiration Date:   12/28/2022   Vitamin B12    Standing Status:   Future    Number of Occurrences:   1    Standing Expiration Date:   12/28/2022    I discussed the assessment and treatment plan with the patient. The patient was provided an opportunity to ask questions and all were answered. The patient agreed with the plan and demonstrated an understanding of the instructions. The patient was advised to call back or seek an in-person evaluation if the symptoms worsen or if the condition fails to improve as anticipated.  Follow up plan: Return if symptoms worsen or fail to improve.  [name], MD

## 2021-12-29 ENCOUNTER — Other Ambulatory Visit: Payer: Self-pay | Admitting: Family Medicine

## 2021-12-29 ENCOUNTER — Encounter: Payer: Self-pay | Admitting: Family Medicine

## 2021-12-29 DIAGNOSIS — R5382 Chronic fatigue, unspecified: Secondary | ICD-10-CM

## 2021-12-29 DIAGNOSIS — L650 Telogen effluvium: Secondary | ICD-10-CM | POA: Insufficient documentation

## 2021-12-29 DIAGNOSIS — R7879 Finding of abnormal level of heavy metals in blood: Secondary | ICD-10-CM

## 2021-12-29 DIAGNOSIS — M7989 Other specified soft tissue disorders: Secondary | ICD-10-CM

## 2021-12-29 DIAGNOSIS — L509 Urticaria, unspecified: Secondary | ICD-10-CM

## 2021-12-29 MED ORDER — CYANOCOBALAMIN 500 MCG PO TABS: 500.0000 ug | ORAL_TABLET | Freq: Every day | ORAL | Status: DC

## 2021-12-29 MED ORDER — VITAMIN D (CHOLECALCIFEROL) 10 MCG (400 UNIT) PO CAPS: 2.0000 | ORAL_CAPSULE | Freq: Every day | ORAL | Status: DC

## 2021-12-29 NOTE — Assessment & Plan Note (Addendum)
Update levels. She hasn't been supplementing regularly.

## 2021-12-29 NOTE — Assessment & Plan Note (Addendum)
Of unclear cause - doesn't have high copper diet, water copper levels were also normal at home, she doesn't drink water from metal container.  Tested negative for wilson's disease (high ceruloplasmin), no fmhx copper excess disorder.  Zinc treatment started with benefit but now initial symptoms recurring. Will recheck copper levels now she's on zinc 50+mg daily.

## 2021-12-29 NOTE — Assessment & Plan Note (Signed)
Diagnosed by derm.  Update TFTs and iron panel.

## 2021-12-29 NOTE — Assessment & Plan Note (Signed)
She is regular with 400 IU daily. Has had trouble tolerating higher dose. Goal vit D levels >50 per dermatology for optimal hair growth. Will repeat levels, I did suggest she increase to 800IU daily.

## 2021-12-30 ENCOUNTER — Ambulatory Visit: Payer: BC Managed Care – PPO | Admitting: Podiatry

## 2021-12-30 ENCOUNTER — Other Ambulatory Visit: Payer: Self-pay

## 2021-12-30 DIAGNOSIS — R7879 Finding of abnormal level of heavy metals in blood: Secondary | ICD-10-CM | POA: Diagnosis not present

## 2021-12-30 DIAGNOSIS — E559 Vitamin D deficiency, unspecified: Secondary | ICD-10-CM | POA: Diagnosis not present

## 2021-12-30 DIAGNOSIS — M722 Plantar fascial fibromatosis: Secondary | ICD-10-CM | POA: Diagnosis not present

## 2021-12-30 DIAGNOSIS — M205X9 Other deformities of toe(s) (acquired), unspecified foot: Secondary | ICD-10-CM

## 2021-12-30 DIAGNOSIS — L650 Telogen effluvium: Secondary | ICD-10-CM | POA: Diagnosis not present

## 2021-12-30 DIAGNOSIS — E538 Deficiency of other specified B group vitamins: Secondary | ICD-10-CM | POA: Diagnosis not present

## 2021-12-30 DIAGNOSIS — R2681 Unsteadiness on feet: Secondary | ICD-10-CM

## 2021-12-31 LAB — IRON AND TIBC
Iron Saturation: 25 % (ref 15–55)
Iron: 107 ug/dL (ref 27–159)
Total Iron Binding Capacity: 431 ug/dL (ref 250–450)
UIBC: 324 ug/dL (ref 131–425)

## 2022-01-03 LAB — COPPER, SERUM: Copper: 238 ug/dL — ABNORMAL HIGH (ref 80–158)

## 2022-01-03 LAB — VITAMIN B12: Vitamin B-12: 222 pg/mL — ABNORMAL LOW (ref 232–1245)

## 2022-01-03 LAB — VITAMIN D 25 HYDROXY (VIT D DEFICIENCY, FRACTURES): Vit D, 25-Hydroxy: 38.8 ng/mL (ref 30.0–100.0)

## 2022-01-03 LAB — TSH: TSH: 2.57 u[IU]/mL (ref 0.450–4.500)

## 2022-01-03 LAB — T4, FREE: Free T4: 1.17 ng/dL (ref 0.82–1.77)

## 2022-01-03 LAB — FERRITIN: Ferritin: 19 ng/mL (ref 15–150)

## 2022-01-03 NOTE — Progress Notes (Signed)
Subjective: 31 year old female presents the office today for evaluation of heel pain, gait instability as well as orthotic check.  She is not able to wear the orthotics as much as she would like that she is not sure how they are working but so far they are comfortable.  No recent changes otherwise.  She would like to continue with physical therapy would like to change locations to [facility] Mike Craze) due to the location.  She does feel the physical therapy that she has done so far has been helpful.  No other concerns.  Objective: AAO x3, NAD DP/PT pulses palpable bilaterally, CRT less than 3 seconds Overall exam patient unchanged.  His foot type is present.  Noted bruising to digits.  Not able to elicit any area of pinpoint tenderness.  No edema, erythema.  Flexor and extensor tendons appear to be intact.  MMT 5/5. No pain with calf compression, swelling, warmth, erythema  Assessment: Plantar fasciitis, gait instability  Plan: -All treatment options discussed with the patient including all alternatives, risks, complications.  -Continue orthotics.  Should she need any adjustments or changes to let me know.  New referral placed to [facility] physical therapy. -Patient encouraged to call the office with any questions, concerns, change in symptoms.   Trula Slade DPM

## 2022-01-04 ENCOUNTER — Telehealth: Payer: Self-pay | Admitting: Family Medicine

## 2022-01-04 ENCOUNTER — Other Ambulatory Visit: Payer: Self-pay | Admitting: Family Medicine

## 2022-01-04 MED ORDER — SLOW RELEASE IRON 160 (50 FE) MG PO TBCR: 1.0000 | EXTENDED_RELEASE_TABLET | ORAL | 3 refills | Status: DC

## 2022-01-04 NOTE — Telephone Encounter (Signed)
Pt called in requesting a call back regarding medication concern and questions .pt wants to know if she needs a follow up appointment . Please advise [phone]

## 2022-01-04 NOTE — Telephone Encounter (Signed)
Spoke to patient by telephone and was advised that she has been prescribed an iron supplement but her pharmacy does not have it and she is not sure when they will be getting it in. Patient stated that she is concerned about taking an iron supplement and wondering if it may throw her other labs off. Patient stated that she is exhausted and really tired. Patient stated that she is wondering if she may have an underlying condition that may be causing these abnormal readings. Patient stated that her dad died last year of cancer and he had some of the same abnormal lab readings that she seems to be having. Patient stated that she was started on birth control pills last year because of a cyst that she has. Patient wants to know if she should be referred to someone else about her lab results.

## 2022-01-05 ENCOUNTER — Telehealth: Payer: Self-pay | Admitting: Podiatry

## 2022-01-05 NOTE — Telephone Encounter (Signed)
Patient FYI -    Patient called back she is not going to go to [facility] it is way too much money for her to go there.  She is just going to stay with [facility] here in [place].

## 2022-01-06 NOTE — Telephone Encounter (Signed)
Pt called back as she has not heard back. She is aware Dr Darnell Level is out unexpectedly today. She will not start new meds until hearing back from [place].

## 2022-01-10 MED ORDER — POLYSACCHARIDE IRON COMPLEX 150 MG PO CAPS: 150.0000 mg | ORAL_CAPSULE | ORAL | 3 refills | Status: DC

## 2022-01-10 NOTE — Telephone Encounter (Signed)
I recommend she start with oral iron replacement as she had low normal iron stores and low iron can contribute to fatigue.  I have sent in a different low dose iron formulation to her pharmacy, but if unavailable she should ask pharmacist for preferred low dose iron formulation available OTC to try.  After taking iron regularly for 1 month recommend retesting labwork/iron levels.  Don't recommend referral elsewhere at this time.  I also believe her OCP has some iron in it?  [Consider rpt anti-TTG IgA with IgA levels r/o celiac]

## 2022-01-10 NOTE — Telephone Encounter (Signed)
Lvm asking pt to call back.  Need to relay Dr. Synthia Innocent message.  I'm also sending his message via [place].

## 2022-01-12 DIAGNOSIS — R530 Neoplastic (malignant) related fatigue: Secondary | ICD-10-CM | POA: Diagnosis not present

## 2022-01-12 DIAGNOSIS — Z309 Encounter for contraceptive management, unspecified: Secondary | ICD-10-CM | POA: Diagnosis not present

## 2022-01-19 DIAGNOSIS — M791 Myalgia, unspecified site: Secondary | ICD-10-CM | POA: Diagnosis not present

## 2022-01-19 DIAGNOSIS — M2560 Stiffness of unspecified joint, not elsewhere classified: Secondary | ICD-10-CM | POA: Diagnosis not present

## 2022-01-19 DIAGNOSIS — R531 Weakness: Secondary | ICD-10-CM | POA: Diagnosis not present

## 2022-01-19 DIAGNOSIS — M542 Cervicalgia: Secondary | ICD-10-CM | POA: Diagnosis not present

## 2022-01-26 DIAGNOSIS — M791 Myalgia, unspecified site: Secondary | ICD-10-CM | POA: Diagnosis not present

## 2022-01-26 DIAGNOSIS — M542 Cervicalgia: Secondary | ICD-10-CM | POA: Diagnosis not present

## 2022-01-26 DIAGNOSIS — M2560 Stiffness of unspecified joint, not elsewhere classified: Secondary | ICD-10-CM | POA: Diagnosis not present

## 2022-01-26 DIAGNOSIS — R531 Weakness: Secondary | ICD-10-CM | POA: Diagnosis not present

## 2022-01-30 DIAGNOSIS — R531 Weakness: Secondary | ICD-10-CM | POA: Diagnosis not present

## 2022-01-30 DIAGNOSIS — M791 Myalgia, unspecified site: Secondary | ICD-10-CM | POA: Diagnosis not present

## 2022-01-30 DIAGNOSIS — M2560 Stiffness of unspecified joint, not elsewhere classified: Secondary | ICD-10-CM | POA: Diagnosis not present

## 2022-01-30 DIAGNOSIS — M542 Cervicalgia: Secondary | ICD-10-CM | POA: Diagnosis not present

## 2022-02-03 DIAGNOSIS — R531 Weakness: Secondary | ICD-10-CM | POA: Diagnosis not present

## 2022-02-03 DIAGNOSIS — M542 Cervicalgia: Secondary | ICD-10-CM | POA: Diagnosis not present

## 2022-02-03 DIAGNOSIS — M791 Myalgia, unspecified site: Secondary | ICD-10-CM | POA: Diagnosis not present

## 2022-02-03 DIAGNOSIS — M2560 Stiffness of unspecified joint, not elsewhere classified: Secondary | ICD-10-CM | POA: Diagnosis not present

## 2022-02-07 DIAGNOSIS — M791 Myalgia, unspecified site: Secondary | ICD-10-CM | POA: Diagnosis not present

## 2022-02-07 DIAGNOSIS — M2560 Stiffness of unspecified joint, not elsewhere classified: Secondary | ICD-10-CM | POA: Diagnosis not present

## 2022-02-07 DIAGNOSIS — M542 Cervicalgia: Secondary | ICD-10-CM | POA: Diagnosis not present

## 2022-02-07 DIAGNOSIS — R531 Weakness: Secondary | ICD-10-CM | POA: Diagnosis not present

## 2022-02-09 DIAGNOSIS — M542 Cervicalgia: Secondary | ICD-10-CM | POA: Diagnosis not present

## 2022-02-09 DIAGNOSIS — R531 Weakness: Secondary | ICD-10-CM | POA: Diagnosis not present

## 2022-02-09 DIAGNOSIS — M791 Myalgia, unspecified site: Secondary | ICD-10-CM | POA: Diagnosis not present

## 2022-02-09 DIAGNOSIS — M2560 Stiffness of unspecified joint, not elsewhere classified: Secondary | ICD-10-CM | POA: Diagnosis not present

## 2022-02-14 DIAGNOSIS — M791 Myalgia, unspecified site: Secondary | ICD-10-CM | POA: Diagnosis not present

## 2022-02-14 DIAGNOSIS — R531 Weakness: Secondary | ICD-10-CM | POA: Diagnosis not present

## 2022-02-14 DIAGNOSIS — M542 Cervicalgia: Secondary | ICD-10-CM | POA: Diagnosis not present

## 2022-02-14 DIAGNOSIS — M2560 Stiffness of unspecified joint, not elsewhere classified: Secondary | ICD-10-CM | POA: Diagnosis not present

## 2022-02-16 DIAGNOSIS — M5136 Other intervertebral disc degeneration, lumbar region: Secondary | ICD-10-CM | POA: Diagnosis not present

## 2022-02-16 DIAGNOSIS — M5030 Other cervical disc degeneration, unspecified cervical region: Secondary | ICD-10-CM | POA: Diagnosis not present

## 2022-02-17 DIAGNOSIS — M2560 Stiffness of unspecified joint, not elsewhere classified: Secondary | ICD-10-CM | POA: Diagnosis not present

## 2022-02-17 DIAGNOSIS — M791 Myalgia, unspecified site: Secondary | ICD-10-CM | POA: Diagnosis not present

## 2022-02-17 DIAGNOSIS — M542 Cervicalgia: Secondary | ICD-10-CM | POA: Diagnosis not present

## 2022-02-17 DIAGNOSIS — R531 Weakness: Secondary | ICD-10-CM | POA: Diagnosis not present

## 2022-02-21 DIAGNOSIS — M542 Cervicalgia: Secondary | ICD-10-CM | POA: Diagnosis not present

## 2022-02-21 DIAGNOSIS — M791 Myalgia, unspecified site: Secondary | ICD-10-CM | POA: Diagnosis not present

## 2022-02-21 DIAGNOSIS — M2560 Stiffness of unspecified joint, not elsewhere classified: Secondary | ICD-10-CM | POA: Diagnosis not present

## 2022-02-21 DIAGNOSIS — R531 Weakness: Secondary | ICD-10-CM | POA: Diagnosis not present

## 2022-02-24 DIAGNOSIS — R531 Weakness: Secondary | ICD-10-CM | POA: Diagnosis not present

## 2022-02-24 DIAGNOSIS — M2560 Stiffness of unspecified joint, not elsewhere classified: Secondary | ICD-10-CM | POA: Diagnosis not present

## 2022-02-24 DIAGNOSIS — M542 Cervicalgia: Secondary | ICD-10-CM | POA: Diagnosis not present

## 2022-02-24 DIAGNOSIS — M791 Myalgia, unspecified site: Secondary | ICD-10-CM | POA: Diagnosis not present

## 2022-02-27 DIAGNOSIS — M791 Myalgia, unspecified site: Secondary | ICD-10-CM | POA: Diagnosis not present

## 2022-02-27 DIAGNOSIS — M2560 Stiffness of unspecified joint, not elsewhere classified: Secondary | ICD-10-CM | POA: Diagnosis not present

## 2022-02-27 DIAGNOSIS — R531 Weakness: Secondary | ICD-10-CM | POA: Diagnosis not present

## 2022-02-27 DIAGNOSIS — M542 Cervicalgia: Secondary | ICD-10-CM | POA: Diagnosis not present

## 2022-03-01 ENCOUNTER — Encounter: Payer: Self-pay | Admitting: Family Medicine

## 2022-03-01 ENCOUNTER — Other Ambulatory Visit: Payer: Self-pay

## 2022-03-01 ENCOUNTER — Ambulatory Visit: Payer: BC Managed Care – PPO | Admitting: Family Medicine

## 2022-03-01 VITALS — BP 116/80 | HR 95 | Temp 97.9°F | Ht 67.0 in | Wt 155.5 lb

## 2022-03-01 DIAGNOSIS — E559 Vitamin D deficiency, unspecified: Secondary | ICD-10-CM

## 2022-03-01 DIAGNOSIS — E781 Pure hyperglyceridemia: Secondary | ICD-10-CM

## 2022-03-01 DIAGNOSIS — K5909 Other constipation: Secondary | ICD-10-CM

## 2022-03-01 DIAGNOSIS — R5382 Chronic fatigue, unspecified: Secondary | ICD-10-CM

## 2022-03-01 DIAGNOSIS — R790 Abnormal level of blood mineral: Secondary | ICD-10-CM | POA: Diagnosis not present

## 2022-03-01 DIAGNOSIS — E538 Deficiency of other specified B group vitamins: Secondary | ICD-10-CM | POA: Diagnosis not present

## 2022-03-01 DIAGNOSIS — L650 Telogen effluvium: Secondary | ICD-10-CM

## 2022-03-01 DIAGNOSIS — R7879 Finding of abnormal level of heavy metals in blood: Secondary | ICD-10-CM

## 2022-03-01 NOTE — Patient Instructions (Addendum)
I'm glad you're doing better on current vitamins/mineral regimen! ?Go to [facility] for repeat labs.  ?We will be in touch with results.  ?

## 2022-03-01 NOTE — Progress Notes (Signed)
Patient ID: Caroline [name], female    DOB: December 23, 1991, 31 y.o.   MRN: [id number]  This visit was conducted in person.  BP 116/80    Pulse 95    Temp 97.9 F (36.6 C) (Temporal)    Ht '5\' 7"'$  (1.702 m)    Wt 155 lb 8 oz (70.5 kg)    LMP 02/04/2022    SpO2 99%    BMI 24.35 kg/m    CC: f/u visit  Subjective:   HPI: Caroline [name] is a 31 y.o. female presenting on 03/01/2022 for Follow-up   See prior note for details. Found to have elevated copper levels in blood to 245 (normal range 80-158) 09/2021 - however ceruloplasmin and urine copper levels returned ok. B12 also low normal however homocysteine and MMA levels were normal. Unclear cause of elevated copper levels as she tested her water at home and copper wasn't elevated, and doesn't drink from copper containers or pipes. She started zinc '50mg'$  daily (11/10/2021) with initial significant improvement in energy levels but again noticed fatigue settling in again.   She was also found to have low normal iron stores but normal iron panel, and low b12 at 222. Her vitamin D was 39, dermatology recommended goal >50 for optimal hair growth. She has previously difficulty tolerating high vit D3 dose - is doing better on 1000 IU daily. She also previously had difficulty tolerating high b12 doses - she's currently taking 1000 mcg daily and tolerating well.   For her low iron stores, we started low dose iron Rx Niferex '150mg'$  MWF - tolerating well without severe constipation although sometimes she has episodes of GI upset associated with constipation which she does attribute to iron replacement.   Notes significant benefit in energy levels on oral iron.   Lips stay very dry and cracked - she has been using hydrocortisone 2.5% ointment. Not as helpful as previously.   Overall she is feeling much better on current vitamin/supplement regimen.   She is also undergoing outpatient physical therapy with benefit to lower back and feet - notes improvement in mobility,  has been able to go out on walks for exercise and stay up later in the evenings - which she notes a few months ago would have been impossible to do.   LMP 02/04/2022. Ongoing spotting since then. May touch base with gyn about this. She's on OCP Blisovi Fe (1/20) since ~summer 2022.      Relevant past medical, surgical, family and social history reviewed and updated as indicated. Interim medical history since our last visit reviewed. Allergies and medications reviewed and updated. Outpatient Medications Prior to Visit  Medication Sig Dispense Refill   cetirizine (ZYRTEC) 10 MG tablet Take 10 mg by mouth daily as needed for allergies.     Cholecalciferol (VITAMIN D3) 25 MCG (1000 UT) CAPS Take 1 capsule by mouth daily.     diphenhydrAMINE (BENADRYL) 25 mg capsule Take 50 mg by mouth every 6 (six) hours as needed for allergies or itching.     hydrocortisone 2.5 % ointment Apply BID to lips up to 2 weeks PRN 30 g 1   iron polysaccharides (FERREX 150) 150 MG capsule Take 1 capsule (150 mg total) by mouth every Monday, Wednesday, and Friday. 40 capsule 3   LORazepam (ATIVAN) 0.5 MG tablet Take 0.5-1 tablets (0.25-0.5 mg total) by mouth at bedtime as needed for anxiety or sleep. 20 tablet 0   Multiple Vitamin (MULTIVITAMIN ADULT) TABS Take 1 tablet  by mouth daily. Nature Made MVI for Her     Norethindrone Acetate-Ethinyl Estrad-FE (BLISOVI 24 FE) 1-20 MG-MCG(24) tablet Take 1 tablet by mouth daily.     polyethylene glycol (MIRALAX / GLYCOLAX) packet Take 17 g by mouth daily. As needed     triamcinolone (NASACORT) 55 MCG/ACT AERO nasal inhaler Place 2 sprays into the nose daily. 1 Inhaler    vitamin B-12 (CYANOCOBALAMIN) 1000 MCG tablet Take 1,000 mcg by mouth daily.     zinc gluconate 50 MG tablet Take 1 tablet (50 mg total) by mouth daily.     vitamin B-12 (CYANOCOBALAMIN) 500 MCG tablet Take 1 tablet (500 mcg total) by mouth daily.     Vitamin D, Cholecalciferol, 10 MCG (400 UNIT) CAPS Take 2  capsules by mouth daily.     No facility-administered medications prior to visit.     Per HPI unless specifically indicated in ROS section below Review of Systems  Objective:  BP 116/80    Pulse 95    Temp 97.9 F (36.6 C) (Temporal)    Ht '5\' 7"'$  (1.702 m)    Wt 155 lb 8 oz (70.5 kg)    LMP 02/04/2022    SpO2 99%    BMI 24.35 kg/m   Wt Readings from Last 3 Encounters:  03/01/22 155 lb 8 oz (70.5 kg)  12/28/21 152 lb (68.9 kg)  10/24/21 151 lb 7 oz (68.7 kg)      Physical Exam Vitals and nursing note reviewed.  Constitutional:      Appearance: Normal appearance. She is not ill-appearing.  Neck:     Thyroid: No thyroid mass or thyromegaly.  Cardiovascular:     Rate and Rhythm: Normal rate and regular rhythm.     Pulses: Normal pulses.     Heart sounds: Normal heart sounds. No murmur heard. Pulmonary:     Effort: Pulmonary effort is normal. No respiratory distress.     Breath sounds: Normal breath sounds. No wheezing, rhonchi or rales.  Musculoskeletal:     Right lower leg: No edema.     Left lower leg: No edema.  Skin:    General: Skin is warm and dry.     Findings: No rash.  Neurological:     General: No focal deficit present.     Mental Status: She is alert.  Psychiatric:        Mood and Affect: Mood normal.        Behavior: Behavior normal.      Results for orders placed or performed in visit on 12/29/21  Iron and TIBC  Result Value Ref Range   Total Iron Binding Capacity 431 250 - 450 ug/dL   UIBC 324 131 - 425 ug/dL   Iron 107 27 - 159 ug/dL   Iron Saturation 25 15 - 55 %   Lab Results  Component Value Date   CHOL 154 12/05/2019   HDL 71 12/05/2019   LDLCALC 70 12/05/2019   TRIG 67 12/05/2019   CHOLHDL 2.2 12/05/2019    Lab Results  Component Value Date   VITAMINB12 222 (L) 12/30/2021    Lab Results  Component Value Date   FERRITIN 19 12/30/2021   Labs from [facility] 10/31/2021, 12/30/2021: Copper 80 - 158 ug/dL 238 High     Copper, Ur Not Estab.  ug/L 3   Comment:  Detection Limit = 1  Creatinine(Crt),U 0.30 - 3.00 g/L 0.31   Comment:                                 Detection Limit = 0.10  Copper / Creatinine Ratio 0 - 49 ug/g creat 10   Copper, 24H Ur 3 - 35 ug/24 hr 8    Ceruloplasmin 19.0 - 39.0 mg/dL 52.6 High     Methylmalonic Acid 0 - 378 nmol/L 155    Homocysteine 0.0 - 14.5 umol/L 8.4    Assessment & Plan:  This visit occurred during the SARS-CoV-2 public health emergency.  Safety protocols were in place, including screening questions prior to the visit, additional usage of staff PPE, and extensive cleaning of exam room while observing appropriate contact time as indicated for disinfecting solutions.   Problem List Items Addressed This Visit     Vitamin D deficiency    Tolerating vitamin D3 1000 IU daily - will update vitamin levels.  Goal vitamin D is >50 per dermatology recommendations.       Relevant Orders   VITAMIN D 25 Hydroxy (Vit-D Deficiency, Fractures)   CONSTIPATION, CHRONIC    Longstanding issue, due to colonic inertia, has seen GI previously. Discussed caution with oral iron.       Chronic fatigue    Improvement noted on current vitamin/mineral regimen - continue this.       High blood copper level - Primary    As of yet of unclear cause.  Although she's felt better with zinc replacement '50mg'$  daily, copper levels remain elevated.  Ceruloplasmin was not low, no fmhx copper excess disorder that she knows.  24 hour urine copper levels were normal.  Home water copper levels returned normal.  Will recheck copper levels today.      Relevant Orders   Copper, Serum   Zinc   Vitamin B12 deficiency    Update levels on 1040mg daily oral replacement.       Relevant Orders   Vitamin B12   Telogen effluvium    Saw derm. Has been recommended increasing vitamin D levels to >50.       Hypertriglyceridemia    States recent fasting triglyceride reading was elevated -  this was done through work late last year. Request repeat fasting triglyceride.       Relevant Orders   Lipid panel   Low iron stores   Relevant Orders   Ferritin   Iron and TIBC([facility])     No orders of the defined types were placed in this encounter.  Orders Placed This Encounter  Procedures   Ferritin    Standing Status:   Future    Number of Occurrences:   1    Standing Expiration Date:   03/02/2023   Vitamin B12    Standing Status:   Future    Number of Occurrences:   1    Standing Expiration Date:   03/02/2023   VITAMIN D 25 Hydroxy (Vit-D Deficiency, Fractures)    Standing Status:   Future    Number of Occurrences:   1    Standing Expiration Date:   03/02/2023   Lipid panel    Standing Status:   Future    Number of Occurrences:   1    Standing Expiration Date:   03/02/2023   Copper, Serum    Standing Status:   Future    Number of  Occurrences:   1    Standing Expiration Date:   03/02/2023   Zinc    Standing Status:   Future    Number of Occurrences:   1    Standing Expiration Date:   03/02/2023   Iron and TIBC([facility])    Standing Status:   Future    Number of Occurrences:   1    Standing Expiration Date:   03/02/2023     Patient Instructions  I'm glad you're doing better on current vitamins/mineral regimen! Go to [facility] for repeat labs.  We will be in touch with results.   Follow up plan: Return if symptoms worsen or fail to improve.  [name], MD

## 2022-03-02 DIAGNOSIS — E781 Pure hyperglyceridemia: Secondary | ICD-10-CM | POA: Insufficient documentation

## 2022-03-02 DIAGNOSIS — R790 Abnormal level of blood mineral: Secondary | ICD-10-CM | POA: Insufficient documentation

## 2022-03-02 NOTE — Assessment & Plan Note (Signed)
Improvement noted on current vitamin/mineral regimen - continue this.  ?

## 2022-03-02 NOTE — Assessment & Plan Note (Signed)
States recent fasting triglyceride reading was elevated - this was done through work late last year. Request repeat fasting triglyceride.  ?

## 2022-03-02 NOTE — Assessment & Plan Note (Signed)
Tolerating vitamin D3 1000 IU daily - will update vitamin levels.  ?Goal vitamin D is >50 per dermatology recommendations.  ?

## 2022-03-02 NOTE — Assessment & Plan Note (Signed)
Update levels on 101mg daily oral replacement.  ?

## 2022-03-02 NOTE — Assessment & Plan Note (Addendum)
Saw derm. Has been recommended increasing vitamin D levels to >50.  ?

## 2022-03-02 NOTE — Assessment & Plan Note (Signed)
Longstanding issue, due to colonic inertia, has seen GI previously. ?Discussed caution with oral iron.  ?

## 2022-03-02 NOTE — Assessment & Plan Note (Addendum)
As of yet of unclear cause.  ?Although she's felt better with zinc replacement '50mg'$  daily, copper levels remain elevated.  ?Ceruloplasmin was not low, no fmhx copper excess disorder that she knows.  ?24 hour urine copper levels were normal.  ?Home water copper levels returned normal.  ?Will recheck copper levels today. ?

## 2022-03-10 DIAGNOSIS — R531 Weakness: Secondary | ICD-10-CM | POA: Diagnosis not present

## 2022-03-10 DIAGNOSIS — M542 Cervicalgia: Secondary | ICD-10-CM | POA: Diagnosis not present

## 2022-03-10 DIAGNOSIS — M791 Myalgia, unspecified site: Secondary | ICD-10-CM | POA: Diagnosis not present

## 2022-03-10 DIAGNOSIS — M2560 Stiffness of unspecified joint, not elsewhere classified: Secondary | ICD-10-CM | POA: Diagnosis not present

## 2022-03-13 DIAGNOSIS — M542 Cervicalgia: Secondary | ICD-10-CM | POA: Diagnosis not present

## 2022-03-13 DIAGNOSIS — R531 Weakness: Secondary | ICD-10-CM | POA: Diagnosis not present

## 2022-03-13 DIAGNOSIS — M791 Myalgia, unspecified site: Secondary | ICD-10-CM | POA: Diagnosis not present

## 2022-03-13 DIAGNOSIS — M2560 Stiffness of unspecified joint, not elsewhere classified: Secondary | ICD-10-CM | POA: Diagnosis not present

## 2022-03-14 DIAGNOSIS — E538 Deficiency of other specified B group vitamins: Secondary | ICD-10-CM | POA: Diagnosis not present

## 2022-03-14 DIAGNOSIS — R790 Abnormal level of blood mineral: Secondary | ICD-10-CM | POA: Diagnosis not present

## 2022-03-14 DIAGNOSIS — E781 Pure hyperglyceridemia: Secondary | ICD-10-CM | POA: Diagnosis not present

## 2022-03-14 DIAGNOSIS — E559 Vitamin D deficiency, unspecified: Secondary | ICD-10-CM | POA: Diagnosis not present

## 2022-03-14 DIAGNOSIS — R7879 Finding of abnormal level of heavy metals in blood: Secondary | ICD-10-CM | POA: Diagnosis not present

## 2022-03-16 LAB — LIPID PANEL
Chol/HDL Ratio: 3.1 ratio (ref 0.0–4.4)
Cholesterol, Total: 175 mg/dL (ref 100–199)
HDL: 56 mg/dL (ref >39)
LDL Chol Calc (NIH): 87 mg/dL (ref 0–99)
Triglycerides: 187 mg/dL — ABNORMAL HIGH (ref 0–149)
VLDL Cholesterol Cal: 32 mg/dL (ref 5–40)

## 2022-03-16 LAB — IRON AND TIBC
Iron Saturation: 16 % (ref 15–55)
Iron: 63 ug/dL (ref 27–159)
Total Iron Binding Capacity: 397 ug/dL (ref 250–450)
UIBC: 334 ug/dL (ref 131–425)

## 2022-03-16 LAB — VITAMIN B12: Vitamin B-12: 697 pg/mL (ref 232–1245)

## 2022-03-16 LAB — ZINC: Zinc: 84 ug/dL (ref 44–115)

## 2022-03-16 LAB — COPPER, SERUM: Copper: 205 ug/dL — ABNORMAL HIGH (ref 80–158)

## 2022-03-16 LAB — VITAMIN D 25 HYDROXY (VIT D DEFICIENCY, FRACTURES): Vit D, 25-Hydroxy: 40.9 ng/mL (ref 30.0–100.0)

## 2022-03-16 LAB — FERRITIN: Ferritin: 40 ng/mL (ref 15–150)

## 2022-03-17 DIAGNOSIS — M542 Cervicalgia: Secondary | ICD-10-CM | POA: Diagnosis not present

## 2022-03-17 DIAGNOSIS — M2560 Stiffness of unspecified joint, not elsewhere classified: Secondary | ICD-10-CM | POA: Diagnosis not present

## 2022-03-17 DIAGNOSIS — M791 Myalgia, unspecified site: Secondary | ICD-10-CM | POA: Diagnosis not present

## 2022-03-17 DIAGNOSIS — R531 Weakness: Secondary | ICD-10-CM | POA: Diagnosis not present

## 2022-03-21 DIAGNOSIS — R531 Weakness: Secondary | ICD-10-CM | POA: Diagnosis not present

## 2022-03-21 DIAGNOSIS — M542 Cervicalgia: Secondary | ICD-10-CM | POA: Diagnosis not present

## 2022-03-21 DIAGNOSIS — M2560 Stiffness of unspecified joint, not elsewhere classified: Secondary | ICD-10-CM | POA: Diagnosis not present

## 2022-03-21 DIAGNOSIS — M791 Myalgia, unspecified site: Secondary | ICD-10-CM | POA: Diagnosis not present

## 2022-03-30 DIAGNOSIS — M791 Myalgia, unspecified site: Secondary | ICD-10-CM | POA: Diagnosis not present

## 2022-03-30 DIAGNOSIS — M542 Cervicalgia: Secondary | ICD-10-CM | POA: Diagnosis not present

## 2022-03-30 DIAGNOSIS — R531 Weakness: Secondary | ICD-10-CM | POA: Diagnosis not present

## 2022-03-30 DIAGNOSIS — M2560 Stiffness of unspecified joint, not elsewhere classified: Secondary | ICD-10-CM | POA: Diagnosis not present

## 2022-04-06 DIAGNOSIS — R531 Weakness: Secondary | ICD-10-CM | POA: Diagnosis not present

## 2022-04-06 DIAGNOSIS — M2560 Stiffness of unspecified joint, not elsewhere classified: Secondary | ICD-10-CM | POA: Diagnosis not present

## 2022-04-06 DIAGNOSIS — M542 Cervicalgia: Secondary | ICD-10-CM | POA: Diagnosis not present

## 2022-04-06 DIAGNOSIS — M791 Myalgia, unspecified site: Secondary | ICD-10-CM | POA: Diagnosis not present

## 2022-04-13 DIAGNOSIS — M542 Cervicalgia: Secondary | ICD-10-CM | POA: Diagnosis not present

## 2022-04-13 DIAGNOSIS — R531 Weakness: Secondary | ICD-10-CM | POA: Diagnosis not present

## 2022-04-13 DIAGNOSIS — M791 Myalgia, unspecified site: Secondary | ICD-10-CM | POA: Diagnosis not present

## 2022-04-13 DIAGNOSIS — M2560 Stiffness of unspecified joint, not elsewhere classified: Secondary | ICD-10-CM | POA: Diagnosis not present

## 2022-04-19 DIAGNOSIS — M542 Cervicalgia: Secondary | ICD-10-CM | POA: Diagnosis not present

## 2022-04-19 DIAGNOSIS — M2560 Stiffness of unspecified joint, not elsewhere classified: Secondary | ICD-10-CM | POA: Diagnosis not present

## 2022-04-19 DIAGNOSIS — M791 Myalgia, unspecified site: Secondary | ICD-10-CM | POA: Diagnosis not present

## 2022-04-19 DIAGNOSIS — R531 Weakness: Secondary | ICD-10-CM | POA: Diagnosis not present

## 2022-04-25 DIAGNOSIS — M791 Myalgia, unspecified site: Secondary | ICD-10-CM | POA: Diagnosis not present

## 2022-04-25 DIAGNOSIS — R531 Weakness: Secondary | ICD-10-CM | POA: Diagnosis not present

## 2022-04-25 DIAGNOSIS — M542 Cervicalgia: Secondary | ICD-10-CM | POA: Diagnosis not present

## 2022-04-25 DIAGNOSIS — M2560 Stiffness of unspecified joint, not elsewhere classified: Secondary | ICD-10-CM | POA: Diagnosis not present

## 2022-04-26 ENCOUNTER — Ambulatory Visit: Payer: BC Managed Care – PPO | Admitting: Dermatology

## 2022-04-26 DIAGNOSIS — K130 Diseases of lips: Secondary | ICD-10-CM | POA: Diagnosis not present

## 2022-04-26 DIAGNOSIS — L719 Rosacea, unspecified: Secondary | ICD-10-CM | POA: Diagnosis not present

## 2022-04-26 DIAGNOSIS — R21 Rash and other nonspecific skin eruption: Secondary | ICD-10-CM | POA: Diagnosis not present

## 2022-04-26 MED ORDER — MOMETASONE FUROATE 0.1 % EX CREA: TOPICAL_CREAM | CUTANEOUS | 1 refills | Status: DC

## 2022-04-26 MED ORDER — TACROLIMUS 0.1 % EX OINT: TOPICAL_OINTMENT | CUTANEOUS | 1 refills | Status: DC

## 2022-04-26 MED ORDER — ZILXI 1.5 % EX FOAM: CUTANEOUS | 2 refills | Status: DC

## 2022-04-26 NOTE — Progress Notes (Addendum)
? ?Follow-Up Visit ?  ?Subjective  ?Caroline [name] is a 31 y.o. female who presents for the following: Rash (Chest, shoulders. Rash on chest started last night, feels on fire and very itchy. She started Gold [employer] and Bumpy moisturizer, Aveeno Calm and Restore Oat Cleanser, and Aveeno Body wash or Dove Sensitive. No other new products, no new soap or detergents. She took 2 Benadryl last night and takes Zyrtec daily. ) and Cheilitis (Lips. She has tried 2.5% hydrocortisone ointment which helps some, but flares once off. She is currently using Vaseline. She switched toothpaste and mouthwash when she started Invisalign. She will finish in 3 weeks. ). She also has bumps on the legs that started last week after shaving. She does have seasonal allergies, no asthma and no history of eczema.  ? ?The following portions of the chart were reviewed this encounter and updated as appropriate:  ?  ?  ? ?Review of Systems:  No other skin or systemic complaints except as noted in HPI or Assessment and Plan. ? ?Objective  ?Well appearing patient in no apparent distress; mood and affect are within normal limits. ? ?A focused examination was performed including face, arms, legs, chest. Relevant physical exam findings are noted in the Assessment and Plan. ? ?chest, back, shoulders ?Small light pink macules and papules on the chest, shoulders, upper back ? ?cheeks, forehead ?Erythema and xerosis of the malar cheeks, NL folds and forehead ? ?Lips ?Erythema and peeling ? ? ? ?Assessment & Plan  ? ?Keratosis Pilaris, Irritated ?- Tiny follicular pink papules of the upper arms, legs ?- Benign. Genetic in nature. No cure. ?- Observe. ?- If desired, patient can use an emollient (moisturizer) containing ammonium lactate, urea or salicylic acid once a day to smooth the area ?- Recommend switching to an electric razor on the legs to minimize irritation. ? ?Rash ?chest, back, shoulders ? ?Possible irritant contact dermatitis to new Gold Bond  Rough and Bumpy cream ? ?Start mometasone cream Apply to AA rash on chest, shoulders, back qd/bid until itchy rash improved dsp 45g 1Rf. Avoid face, groin, axilla. ? ?Recommend mild soap and moisturizing cream 1-2 times daily.  Gentle skin care handout provided.  ? ?Topical steroids (such as triamcinolone, fluocinolone, fluocinonide, mometasone, clobetasol, halobetasol, betamethasone, hydrocortisone) can cause thinning and lightening of the skin if they are used for too long in the same area. Your physician has selected the right strength medicine for your problem and area affected on the body. Please use your medication only as directed by your physician to prevent side effects.  ? ? ?Related Medications ?mometasone (ELOCON) 0.1 % cream ?Apply once to twice daily to itchy rash on chest, shoulders, back until improved. Avoid face, groin, underarms. ? ?Rosacea ?cheeks, forehead ? ?vs Seb Derm ?Chronic and persistent condition with duration or expected duration over one year. Condition is bothersome/symptomatic for patient. Currently flared.  ? ?Start Zilxi Foam Apply to face once daily dsp 30g 2Rf. ?Sample given Lot [id number] 06/2022 ? ?Continue gentle cleanser (Aveeno), Vanicream moisturizer, and daily sunscreen ? ?Rosacea is a chronic progressive skin condition usually affecting the face of adults, causing redness and/or acne bumps. It is treatable but not curable. It sometimes affects the eyes (ocular rosacea) as well. It may respond to topical and/or systemic medication and can flare with stress, sun exposure, alcohol, exercise and some foods.  Daily application of broad spectrum spf 30+ sunscreen to face is recommended to reduce flares.  ? ?Minocycline HCl  Micronized (ZILXI) 1.5 % FOAM - cheeks, forehead ?Apply to face once daily for rosacea. ? ?Cheilitis ?Lips ? ?Flared. Possibly from toothpaste and mouthwash that she switched to after starting Invisalign last November. May improve when she switches back to  regular toothpaste/mouthwash in 3 weeks.  ? ?Start tacrolimus ointment apply to lips bid dsp 30g 1Rf. ? ?Recommend Vaniply Ointment during the day, sample given.  ? ? ? ? ?tacrolimus (PROTOPIC) 0.1 % ointment - Lips ?Apply to lips twice daily until improved. ? ?Related Medications ?hydrocortisone 2.5 % ointment ?Apply BID to lips up to 2 weeks PRN ? ? ?Return in about 4 weeks (around 05/24/2022) for f/u rash, cheilitis. ? ?I, Jamesetta Orleans, CMA, am acting as scribe for Brendolyn Patty, MD . ?Documentation: I have reviewed the above documentation for accuracy and completeness, and I agree with the above. ? ?Brendolyn Patty MD  ? ? ?

## 2022-04-26 NOTE — Patient Instructions (Addendum)
Start Zilxi Foam - apply to face once daily for Rosacea. ? ?Start mometasone cream - apply to itchy rash on chest, shoulders, upper back once to twice daily until improved. Avoid face, groin, underarms. ? ?Start tacrolimus ointment - apply to lips twice daily. May also be used on face/eyelids for eczema flares. ? ?Start VaniCream Ointment during the day to lips. ? ? ? ?If You Need Anything After Your Visit ? ?If you have any questions or concerns for your doctor, please call our main line at [phone] and press option 4 to reach your doctor's medical assistant. If no one answers, please leave a voicemail as directed and we will return your call as soon as possible. Messages left after 4 pm will be answered the following business day.  ? ?You may also send [place] a message via MyChart. We typically respond to MyChart messages within 1-2 business days. ? ?For prescription refills, please ask your pharmacy to contact our office. Our fax number is [phone]. ? ?If you have an urgent issue when the clinic is closed that cannot wait until the next business day, you can page your doctor at the number below.   ? ?Please note that while we do our best to be available for urgent issues outside of office hours, we are not available 24/7.  ? ?If you have an urgent issue and are unable to reach [place], you may choose to seek medical care at your doctor's office, retail clinic, urgent care center, or emergency room. ? ?If you have a medical emergency, please immediately call 911 or go to the emergency department. ? ?Pager Numbers ? ?- Dr. Nehemiah Massed: [phone] ? ?- Dr. Laurence Ferrari: [phone] ? ?- Dr. Nicole Kindred: [phone] ? ?In the event of inclement weather, please call our main line at [phone] for an update on the status of any delays or closures. ? ?Dermatology Medication Tips: ?Please keep the boxes that topical medications come in in order to help keep track of the instructions about where and how to use these. Pharmacies  typically print the medication instructions only on the boxes and not directly on the medication tubes.  ? ?If your medication is too expensive, please contact our office at [phone] option 4 or send [place] a message through [facility].  ? ?We are unable to tell what your co-pay for medications will be in advance as this is different depending on your insurance coverage. However, we may be able to find a substitute medication at lower cost or fill out paperwork to get insurance to cover a needed medication.  ? ?If a prior authorization is required to get your medication covered by your insurance company, please allow [place] 1-2 business days to complete this process. ? ?Drug prices often vary depending on where the prescription is filled and some pharmacies may offer cheaper prices. ? ?The website www.goodrx.com contains coupons for medications through different pharmacies. The prices here do not account for what the cost may be with help from insurance (it may be cheaper with your insurance), but the website can give you the price if you did not use any insurance.  ?- You can print the associated coupon and take it with your prescription to the pharmacy.  ?- You may also stop by our office during regular business hours and pick up a GoodRx coupon card.  ?- If you need your prescription sent electronically to a different pharmacy, notify our office through [facility] or by phone at [phone] option 4. ? ? ? ? ?  Si Usted Necesita Algo Despu?s de Su Visita ? ?Tambi?n puede enviarnos un mensaje a trav?s de MyChart. Por lo general respondemos a los mensajes de MyChart en el transcurso de 1 a 2 d?as h?biles. ? ?Para renovar recetas, por favor pida a su farmacia que se ponga en contacto con nuestra oficina. Nuestro n?mero de fax es el [phone]. ? ?Si tiene un asunto urgente cuando la cl?nica est? cerrada y que no puede esperar hasta el siguiente d?a h?bil, puede llamar/localizar a su doctor(a) al n?mero que  aparece a continuaci?n.  ? ?Por favor, tenga en cuenta que aunque hacemos todo lo posible para estar disponibles para asuntos urgentes fuera del horario de oficina, no estamos disponibles las 24 horas del d?a, los 7 d?as de la semana.  ? ?Si tiene un problema urgente y no puede comunicarse con nosotros, puede optar por buscar atenci?n m?dica  en el consultorio de su doctor(a), en una cl?nica privada, en un centro de atenci?n urgente o en una sala de emergencias. ? ?Si tiene Engineer, maintenance (IT) m?dica, por favor llame inmediatamente al 911 o vaya a la sala de emergencias. ? ?N?meros de b?per ? ?- Dr. Nehemiah Massed: [phone] ? ?- Dra. Moye: [phone] ? ?- Dra. Nicole Kindred: [phone] ? ?En caso de inclemencias del tiempo, por favor llame a nuestra l?nea principal al [phone] para una actualizaci?n sobre el estado de cualquier retraso o cierre. ? ?Consejos para la medicaci?n en dermatolog?a: ?Por favor, guarde las cajas en las que vienen los medicamentos de uso t?pico para ayudarle a seguir las instrucciones sobre d?nde y c?mo usarlos. Las farmacias generalmente imprimen las instrucciones del medicamento s?lo en las cajas y no directamente en los tubos del [place].  ? ?Si su medicamento es muy caro, por favor, p?ngase en contacto con Zigmund Daniel llamando al [phone] y presione la opci?n 4 o env?enos un mensaje a trav?s de MyChart.  ? ?No podemos decirle cu?l ser? su copago por los medicamentos por adelantado ya que esto es diferente dependiendo de la cobertura de su seguro. Sin embargo, es posible que podamos encontrar un medicamento sustituto a Electrical engineer un formulario para que el seguro cubra el medicamento que se considera necesario.  ? ?Si se requiere Ardelia Mems autorizaci?n previa para que su compa??a de seguros [place] su medicamento, por favor perm?tanos de 1 a 2 d?as h?biles para completar este proceso. ? ?Los precios de los medicamentos var?an con frecuencia dependiendo del Environmental consultant de d?nde se surte  la receta y alguna farmacias pueden ofrecer precios m?s baratos. ? ?El sitio web www.goodrx.com tiene cupones para medicamentos de Airline pilot. Los precios aqu? no tienen en cuenta lo que podr?a costar con la ayuda del seguro (puede ser m?s barato con su seguro), pero el sitio web puede darle el precio si no utiliz? ning?n seguro.  ?- Puede imprimir el cup?n correspondiente y llevarlo con su receta a la farmacia.  ?- Tambi?n puede pasar por nuestra oficina durante el horario de atenci?n regular y recoger una tarjeta de cupones de GoodRx.  ?- Si necesita que su receta se env?e electr?nicamente a Chiropodist, informe a nuestra oficina a trav?s de MyChart de [place] o por tel?fono llamando al [phone] y presione la opci?n 4. ? ?

## 2022-05-02 DIAGNOSIS — M542 Cervicalgia: Secondary | ICD-10-CM | POA: Diagnosis not present

## 2022-05-02 DIAGNOSIS — M791 Myalgia, unspecified site: Secondary | ICD-10-CM | POA: Diagnosis not present

## 2022-05-02 DIAGNOSIS — M2560 Stiffness of unspecified joint, not elsewhere classified: Secondary | ICD-10-CM | POA: Diagnosis not present

## 2022-05-02 DIAGNOSIS — R531 Weakness: Secondary | ICD-10-CM | POA: Diagnosis not present

## 2022-05-09 DIAGNOSIS — M791 Myalgia, unspecified site: Secondary | ICD-10-CM | POA: Diagnosis not present

## 2022-05-09 DIAGNOSIS — M542 Cervicalgia: Secondary | ICD-10-CM | POA: Diagnosis not present

## 2022-05-09 DIAGNOSIS — R531 Weakness: Secondary | ICD-10-CM | POA: Diagnosis not present

## 2022-05-09 DIAGNOSIS — M2560 Stiffness of unspecified joint, not elsewhere classified: Secondary | ICD-10-CM | POA: Diagnosis not present

## 2022-05-15 DIAGNOSIS — M2560 Stiffness of unspecified joint, not elsewhere classified: Secondary | ICD-10-CM | POA: Diagnosis not present

## 2022-05-15 DIAGNOSIS — M791 Myalgia, unspecified site: Secondary | ICD-10-CM | POA: Diagnosis not present

## 2022-05-15 DIAGNOSIS — M542 Cervicalgia: Secondary | ICD-10-CM | POA: Diagnosis not present

## 2022-05-15 DIAGNOSIS — R531 Weakness: Secondary | ICD-10-CM | POA: Diagnosis not present

## 2022-05-24 DIAGNOSIS — M542 Cervicalgia: Secondary | ICD-10-CM | POA: Diagnosis not present

## 2022-05-24 DIAGNOSIS — M791 Myalgia, unspecified site: Secondary | ICD-10-CM | POA: Diagnosis not present

## 2022-05-24 DIAGNOSIS — R531 Weakness: Secondary | ICD-10-CM | POA: Diagnosis not present

## 2022-05-24 DIAGNOSIS — M2560 Stiffness of unspecified joint, not elsewhere classified: Secondary | ICD-10-CM | POA: Diagnosis not present

## 2022-05-29 ENCOUNTER — Ambulatory Visit: Payer: BC Managed Care – PPO | Admitting: Dermatology

## 2022-05-29 DIAGNOSIS — K130 Diseases of lips: Secondary | ICD-10-CM

## 2022-05-29 DIAGNOSIS — L719 Rosacea, unspecified: Secondary | ICD-10-CM | POA: Diagnosis not present

## 2022-05-29 DIAGNOSIS — L858 Other specified epidermal thickening: Secondary | ICD-10-CM

## 2022-05-29 DIAGNOSIS — R21 Rash and other nonspecific skin eruption: Secondary | ICD-10-CM

## 2022-05-29 NOTE — Progress Notes (Signed)
Follow-Up Visit   Subjective  Caroline [name] is a 31 y.o. female who presents for the following: Chelitis  (Pt switched toothpaste and is using Vaniply ointment - condition has improved), Rosacea/seb derm (Zilxi foam did help clear up face but she continues to flare around menses), Rash (On the chest and back pt didn't have to use the Mometasone since the rash cleared on it's own after a few days), and Keratosis pilaris  (On the arms - using Amlactin but dislikes the smell. She did try Gold Bond Rough and Bumpy but didn't like the texture). Pt c/o rash on the feet that occurred a few weeks ago but has now improved. She is unsure if it was the sandals she was wearing or something else she came into contact with while at the beach. She would like to discuss whether or not she needs allergy testing since she has been having allergic contact dermatitis issues more often over the past couple of years.   The following portions of the chart were reviewed this encounter and updated as appropriate:       Review of Systems:  No other skin or systemic complaints except as noted in HPI or Assessment and Plan.  Objective  Well appearing patient in no apparent distress; mood and affect are within normal limits.  A focused examination was performed including the face, chest, lips. Relevant physical exam findings are noted in the Assessment and Plan.  B/L arms Tiny follicular keratotic papules.   Lips Mild scaling lips  Face Mild erythema on the cheeks. Some xerosis.  B/L foot Clear today.    Assessment & Plan  Keratosis pilaris B/L arms  - Benign. Genetic in nature. No cure. - Observe. - If desired, patient can use an emollient (moisturizer) containing ammonium lactate, urea or salicylic acid once a day to smooth the area   Pt will try one she hasn't already tried.  Recommend starting moisturizer with exfoliant (Urea, Salicylic acid, or Lactic acid) one to two times daily to help smooth  rough and bumpy skin.  OTC options include Cetaphil Rough and Bumpy lotion (Urea), Eucerin Roughness Relief lotion or spot treatment cream (Urea), CeraVe SA lotion/cream for Rough and Bumpy skin (Sal Acid), Gold Bond Rough and Bumpy cream (Sal Acid), and AmLactin 12% lotion/cream (Lactic Acid).  If applying in morning, also apply sunscreen to sun-exposed areas, since these exfoliating moisturizers can increase sensitivity to sun.    Cheilitis Lips  Improving  Continue Vaniply ointment daily and continue new toothpaste.   Related Medications hydrocortisone 2.5 % ointment Apply BID to lips up to 2 weeks PRN  tacrolimus (PROTOPIC) 0.1 % ointment Apply to lips twice daily until improved.  Rosacea Face  Vs seborrheic dermatitis - improving  Rosacea is a chronic progressive skin condition usually affecting the face of adults, causing redness and/or acne bumps. It is treatable but not curable. It sometimes affects the eyes (ocular rosacea) as well. It may respond to topical and/or systemic medication and can flare with stress, sun exposure, alcohol, exercise and some foods.  Daily application of broad spectrum spf 30+ sunscreen to face is recommended to reduce flares.  Continue CLN moisturizer daily.   Continue Zilxi foam QD.   Samples given of Vanicream facial sunscreen.   Related Medications Minocycline HCl Micronized (ZILXI) 1.5 % FOAM Apply to face once daily for rosacea.  Rash and other nonspecific skin eruption B/L foot  Allergic contact dermatitis vs irritant dermatitis vs other -  Advised that she can use Mometasone PRN flares. Discussed patch testing. Patient plans to schedule in the future.    Return for patching testing on a Monday or Tuesday.  Luther Redo, CMA, am acting as scribe for Brendolyn Patty, MD .  Documentation: I have reviewed the above documentation for accuracy and completeness, and I agree with the above.  Brendolyn Patty MD

## 2022-05-29 NOTE — Patient Instructions (Addendum)
Recommend starting moisturizer with exfoliant (Urea, Salicylic acid, or Lactic acid) one to two times daily to help smooth rough and bumpy skin.  OTC options include Cetaphil Rough and Bumpy lotion (Urea), Eucerin Roughness Relief lotion or spot treatment cream (Urea), CeraVe SA lotion/cream for Rough and Bumpy skin (Sal Acid), Gold Bond Rough and Bumpy cream (Sal Acid), and AmLactin 12% lotion/cream (Lactic Acid).  If applying in morning, also apply sunscreen to sun-exposed areas, since these exfoliating moisturizers can increase sensitivity to sun.   If You Need Anything After Your Visit  If you have any questions or concerns for your doctor, please call our main line at [phone] and press option 4 to reach your doctor's medical assistant. If no one answers, please leave a voicemail as directed and we will return your call as soon as possible. Messages left after 4 pm will be answered the following business day.   You may also send us a message via MyChart. We typically respond to MyChart messages within 1-2 business days.  For prescription refills, please ask your pharmacy to contact our office. Our fax number is [phone].  If you have an urgent issue when the clinic is closed that cannot wait until the next business day, you can page your doctor at the number below.    Please note that while we do our best to be available for urgent issues outside of office hours, we are not available 24/7.   If you have an urgent issue and are unable to reach us, you may choose to seek medical care at your doctor's office, retail clinic, urgent care center, or emergency room.  If you have a medical emergency, please immediately call 911 or go to the emergency department.  Pager Numbers  - Dr. Kowalski: [phone]  - Dr. Moye: [phone]  - Dr. Stewart: [phone]  In the event of inclement weather, please call our main line at [phone] for an update on the status of any delays or  closures.  Dermatology Medication Tips: Please keep the boxes that topical medications come in in order to help keep track of the instructions about where and how to use these. Pharmacies typically print the medication instructions only on the boxes and not directly on the medication tubes.   If your medication is too expensive, please contact our office at [phone] option 4 or send us a message through MyChart.   We are unable to tell what your co-pay for medications will be in advance as this is different depending on your insurance coverage. However, we may be able to find a substitute medication at lower cost or fill out paperwork to get insurance to cover a needed medication.   If a prior authorization is required to get your medication covered by your insurance company, please allow us 1-2 business days to complete this process.  Drug prices often vary depending on where the prescription is filled and some pharmacies may offer cheaper prices.  The website www.goodrx.com contains coupons for medications through different pharmacies. The prices here do not account for what the cost may be with help from insurance (it may be cheaper with your insurance), but the website can give you the price if you did not use any insurance.  - You can print the associated coupon and take it with your prescription to the pharmacy.  - You may also stop by our office during regular business hours and pick up a GoodRx coupon card.  - If you need your prescription   sent electronically to a different pharmacy, notify our office through [facility] MyChart or by phone at [phone] option 4.     Si Usted Necesita Algo Despus de Su Visita  Tambin puede enviarnos un mensaje a travs de MyChart. Por lo general respondemos a los mensajes de MyChart en el transcurso de 1 a 2 das hbiles.  Para renovar recetas, por favor pida a su farmacia que se ponga en contacto con nuestra oficina. Nuestro nmero de fax  es el [phone].  Si tiene un asunto urgente cuando la clnica est cerrada y que no puede esperar hasta el siguiente da hbil, puede llamar/localizar a su doctor(a) al nmero que aparece a continuacin.   Por favor, tenga en cuenta que aunque hacemos todo lo posible para estar disponibles para asuntos urgentes fuera del horario de oficina, no estamos disponibles las 24 horas del da, los 7 das de la semana.   Si tiene un problema urgente y no puede comunicarse con nosotros, puede optar por buscar atencin mdica  en el consultorio de su doctor(a), en una clnica privada, en un centro de atencin urgente o en una sala de emergencias.  Si tiene una emergencia mdica, por favor llame inmediatamente al 911 o vaya a la sala de emergencias.  Nmeros de bper  - Dr. Kowalski: [phone]  - Dra. Moye: [phone]  - Dra. Stewart: [phone]  En caso de inclemencias del tiempo, por favor llame a nuestra lnea principal al [phone] para una actualizacin sobre el estado de cualquier retraso o cierre.  Consejos para la medicacin en dermatologa: Por favor, guarde las cajas en las que vienen los medicamentos de uso tpico para ayudarle a seguir las instrucciones sobre dnde y cmo usarlos. Las farmacias generalmente imprimen las instrucciones del medicamento slo en las cajas y no directamente en los tubos del medicamento.   Si su medicamento es muy caro, por favor, pngase en contacto con nuestra oficina llamando al [phone] y presione la opcin 4 o envenos un mensaje a travs de MyChart.   No podemos decirle cul ser su copago por los medicamentos por adelantado ya que esto es diferente dependiendo de la cobertura de su seguro. Sin embargo, es posible que podamos encontrar un medicamento sustituto a menor costo o llenar un formulario para que el seguro cubra el medicamento que se considera necesario.   Si se requiere una autorizacin previa para que su compaa de seguros cubra  su medicamento, por favor permtanos de 1 a 2 das hbiles para completar este proceso.  Los precios de los medicamentos varan con frecuencia dependiendo del lugar de dnde se surte la receta y alguna farmacias pueden ofrecer precios ms baratos.  El sitio web www.goodrx.com tiene cupones para medicamentos de diferentes farmacias. Los precios aqu no tienen en cuenta lo que podra costar con la ayuda del seguro (puede ser ms barato con su seguro), pero el sitio web puede darle el precio si no utiliz ningn seguro.  - Puede imprimir el cupn correspondiente y llevarlo con su receta a la farmacia.  - Tambin puede pasar por nuestra oficina durante el horario de atencin regular y recoger una tarjeta de cupones de GoodRx.  - Si necesita que su receta se enve electrnicamente a una farmacia diferente, informe a nuestra oficina a travs de MyChart de [place] o por telfono llamando al [phone] y presione la opcin 4.  

## 2022-06-02 DIAGNOSIS — R531 Weakness: Secondary | ICD-10-CM | POA: Diagnosis not present

## 2022-06-02 DIAGNOSIS — M542 Cervicalgia: Secondary | ICD-10-CM | POA: Diagnosis not present

## 2022-06-02 DIAGNOSIS — M2560 Stiffness of unspecified joint, not elsewhere classified: Secondary | ICD-10-CM | POA: Diagnosis not present

## 2022-06-02 DIAGNOSIS — M791 Myalgia, unspecified site: Secondary | ICD-10-CM | POA: Diagnosis not present

## 2022-06-09 DIAGNOSIS — R531 Weakness: Secondary | ICD-10-CM | POA: Diagnosis not present

## 2022-06-09 DIAGNOSIS — M542 Cervicalgia: Secondary | ICD-10-CM | POA: Diagnosis not present

## 2022-06-09 DIAGNOSIS — M2560 Stiffness of unspecified joint, not elsewhere classified: Secondary | ICD-10-CM | POA: Diagnosis not present

## 2022-06-09 DIAGNOSIS — M791 Myalgia, unspecified site: Secondary | ICD-10-CM | POA: Diagnosis not present

## 2022-06-12 ENCOUNTER — Ambulatory Visit (INDEPENDENT_AMBULATORY_CARE_PROVIDER_SITE_OTHER): Payer: BC Managed Care – PPO | Admitting: Dermatology

## 2022-06-12 DIAGNOSIS — R21 Rash and other nonspecific skin eruption: Secondary | ICD-10-CM | POA: Diagnosis not present

## 2022-06-12 NOTE — Progress Notes (Signed)
Patch testing was performed on 06/12/2022 using standard technique. True test x 36 applied to back at visit today. Pt advised to keep panels dry until removal in 2 days for first read.    Johnsie Kindred, RMA  Documentation: I have reviewed the above documentation for accuracy and completeness, and I agree with the above.  Brendolyn Patty MD

## 2022-06-14 ENCOUNTER — Ambulatory Visit (INDEPENDENT_AMBULATORY_CARE_PROVIDER_SITE_OTHER): Payer: BC Managed Care – PPO | Admitting: Dermatology

## 2022-06-14 DIAGNOSIS — L309 Dermatitis, unspecified: Secondary | ICD-10-CM

## 2022-06-14 NOTE — Progress Notes (Signed)
Patient here today for day 3 of True Test reading. Patient shows weak reaction to site #16, black rubber mix and questionable reaction to site number #28, gold.   Patient advised how to read test starting tomorrow, Thursday through Sunday. Patient also advised okay to get wet/shower but do not soak or scrub. All questions and concerns answered.   Dr. Nicole Kindred gave OK for patient to continue xanax and miralax medications.   Johnsie Kindred, RMA  Documentation: I have reviewed the above documentation for accuracy and completeness, and I agree with the above.  Brendolyn Patty MD

## 2022-06-19 ENCOUNTER — Ambulatory Visit: Payer: BC Managed Care – PPO | Admitting: Dermatology

## 2022-06-19 DIAGNOSIS — L2089 Other atopic dermatitis: Secondary | ICD-10-CM

## 2022-06-19 DIAGNOSIS — L2489 Irritant contact dermatitis due to other agents: Secondary | ICD-10-CM | POA: Diagnosis not present

## 2022-06-19 MED ORDER — OPZELURA 1.5 % EX CREA: TOPICAL_CREAM | CUTANEOUS | 2 refills | Status: DC

## 2022-06-20 ENCOUNTER — Telehealth: Payer: Self-pay

## 2022-06-22 DIAGNOSIS — M2560 Stiffness of unspecified joint, not elsewhere classified: Secondary | ICD-10-CM | POA: Diagnosis not present

## 2022-06-22 DIAGNOSIS — M791 Myalgia, unspecified site: Secondary | ICD-10-CM | POA: Diagnosis not present

## 2022-06-22 DIAGNOSIS — M79602 Pain in left arm: Secondary | ICD-10-CM | POA: Diagnosis not present

## 2022-06-22 DIAGNOSIS — R531 Weakness: Secondary | ICD-10-CM | POA: Diagnosis not present

## 2022-06-23 ENCOUNTER — Ambulatory Visit: Payer: BC Managed Care – PPO | Admitting: Family Medicine

## 2022-06-23 ENCOUNTER — Encounter: Payer: Self-pay | Admitting: Family Medicine

## 2022-06-23 VITALS — BP 106/72 | HR 106 | Ht 67.0 in | Wt 151.8 lb

## 2022-06-23 DIAGNOSIS — E538 Deficiency of other specified B group vitamins: Secondary | ICD-10-CM

## 2022-06-23 DIAGNOSIS — E559 Vitamin D deficiency, unspecified: Secondary | ICD-10-CM

## 2022-06-23 DIAGNOSIS — R5382 Chronic fatigue, unspecified: Secondary | ICD-10-CM

## 2022-06-23 DIAGNOSIS — E781 Pure hyperglyceridemia: Secondary | ICD-10-CM

## 2022-06-23 DIAGNOSIS — Z888 Allergy status to other drugs, medicaments and biological substances status: Secondary | ICD-10-CM

## 2022-06-23 DIAGNOSIS — R7879 Finding of abnormal level of heavy metals in blood: Secondary | ICD-10-CM

## 2022-06-23 DIAGNOSIS — J302 Other seasonal allergic rhinitis: Secondary | ICD-10-CM

## 2022-06-23 DIAGNOSIS — R790 Abnormal level of blood mineral: Secondary | ICD-10-CM

## 2022-06-23 MED ORDER — LORAZEPAM 0.5 MG PO TABS: 0.2500 mg | ORAL_TABLET | Freq: Every evening | ORAL | 0 refills | Status: DC | PRN

## 2022-06-23 NOTE — Progress Notes (Unsigned)
Patient ID: Caroline [name], female    DOB: [birth year], 31 y.o.   MRN: [id number]  This visit was conducted in person.  BP 106/72   Pulse (!) 106   Ht '5\' 7"'$  (1.702 m)   Wt 151 lb 12.8 oz (68.9 kg)   LMP 05/29/2022   SpO2 99%   BMI 23.78 kg/m   BP Readings from Last 3 Encounters:  06/23/22 106/72  03/01/22 116/80  10/24/21 118/76   CC: f/u visit Subjective:   HPI: Caroline [name] is a 31 y.o. female presenting on 06/23/2022 for Follow-up (Discuss blood work , swelling feet and hands , feeling tried )   See prior note for details.  Ongoing fatigue worse since last month 04/2022.  High stress at work. Has been taking lorazepam PRN (4 this month) - requests refill.   Ongoing foot pain, feet feel puffy, sore heels worse and turn red with walking, palms remain sore, forehead feels swollen.   Has been seeing Dr Nicole Kindred derm. Recently completed patch allergy testing - weakly positive responses to nickel, neomycin, epoxy resin, black rubber mix.  Worsened KP - that is improving with restarting antihistamine.      Relevant past medical, surgical, family and social history reviewed and updated as indicated. Interim medical history since our last visit reviewed. Allergies and medications reviewed and updated. Outpatient Medications Prior to Visit  Medication Sig Dispense Refill   cetirizine (ZYRTEC) 10 MG tablet Take 10 mg by mouth daily as needed for allergies.     Cholecalciferol (VITAMIN D3) 25 MCG (1000 UT) CAPS Take 1 capsule by mouth daily.     diphenhydrAMINE (BENADRYL) 25 mg capsule Take 50 mg by mouth every 6 (six) hours as needed for allergies or itching.     hydrocortisone 2.5 % ointment Apply BID to lips up to 2 weeks PRN 30 g 1   iron polysaccharides (FERREX 150) 150 MG capsule Take 1 capsule (150 mg total) by mouth every Monday, Wednesday, and Friday. 40 capsule 3   LORazepam (ATIVAN) 0.5 MG tablet Take 0.5-1 tablets (0.25-0.5 mg total) by mouth at bedtime as needed for  anxiety or sleep. 20 tablet 0   Minocycline HCl Micronized (ZILXI) 1.5 % FOAM Apply to face once daily for rosacea. 30 g 2   mometasone (ELOCON) 0.1 % cream Apply once to twice daily to itchy rash on chest, shoulders, back until improved. Avoid face, groin, underarms. 45 g 1   Multiple Vitamin (MULTIVITAMIN ADULT) TABS Take 1 tablet by mouth daily. Nature Made MVI for Her     Norethindrone Acetate-Ethinyl Estrad-FE (BLISOVI 24 FE) 1-20 MG-MCG(24) tablet Take 1 tablet by mouth daily.     polyethylene glycol (MIRALAX / GLYCOLAX) packet Take 17 g by mouth daily. As needed     Ruxolitinib Phosphate (OPZELURA) 1.5 % CREA Apply to affected skin once a day 60 g 2   tacrolimus (PROTOPIC) 0.1 % ointment Apply to lips twice daily until improved. 30 g 1   triamcinolone (NASACORT) 55 MCG/ACT AERO nasal inhaler Place 2 sprays into the nose daily. 1 Inhaler    vitamin B-12 (CYANOCOBALAMIN) 1000 MCG tablet Take 1,000 mcg by mouth daily.     zinc gluconate 50 MG tablet Take 1 tablet (50 mg total) by mouth daily.     No facility-administered medications prior to visit.     Per HPI unless specifically indicated in ROS section below Review of Systems  Objective:  BP 106/72   Pulse Marland Kitchen)  106   Ht '5\' 7"'$  (1.702 m)   Wt 151 lb 12.8 oz (68.9 kg)   LMP 05/29/2022   SpO2 99%   BMI 23.78 kg/m   Wt Readings from Last 3 Encounters:  06/23/22 151 lb 12.8 oz (68.9 kg)  03/01/22 155 lb 8 oz (70.5 kg)  12/28/21 152 lb (68.9 kg)      Physical Exam Vitals and nursing note reviewed.  Constitutional:      Appearance: Normal appearance. She is not ill-appearing.  Cardiovascular:     Rate and Rhythm: Normal rate and regular rhythm.     Pulses: Normal pulses.     Heart sounds: Normal heart sounds. No murmur heard. Pulmonary:     Effort: Pulmonary effort is normal. No respiratory distress.     Breath sounds: Normal breath sounds. No wheezing, rhonchi or rales.  Musculoskeletal:     Right lower leg: No edema.      Left lower leg: No edema.  Skin:    General: Skin is warm and dry.     Findings: Rash present. Rash is papular.     Comments: Faint fine papular rash to bilateral upper arms into forearms  Neurological:     Mental Status: She is alert.  Psychiatric:        Mood and Affect: Mood normal.        Behavior: Behavior normal.       Results for orders placed or performed in visit on 03/01/22  Zinc  Result Value Ref Range   Zinc 84 44 - 115 ug/dL  Copper, Serum  Result Value Ref Range   Copper 205 (H) 80 - 158 ug/dL  Lipid panel  Result Value Ref Range   Cholesterol, Total 175 100 - 199 mg/dL   Triglycerides 187 (H) 0 - 149 mg/dL   HDL 56 >39 mg/dL   VLDL Cholesterol Cal 32 5 - 40 mg/dL   LDL Chol Calc (NIH) 87 0 - 99 mg/dL   Chol/HDL Ratio 3.1 0.0 - 4.4 ratio  VITAMIN D 25 Hydroxy (Vit-D Deficiency, Fractures)  Result Value Ref Range   Vit D, 25-Hydroxy 40.9 30.0 - 100.0 ng/mL  Vitamin B12  Result Value Ref Range   Vitamin B-12 697 232 - 1,245 pg/mL  Ferritin  Result Value Ref Range   Ferritin 40 15 - 150 ng/mL  Iron and TIBC([facility])  Result Value Ref Range   Total Iron Binding Capacity 397 250 - 450 ug/dL   UIBC 334 131 - 425 ug/dL   Iron 63 27 - 159 ug/dL   Iron Saturation 16 15 - 55 %    Assessment & Plan:   Problem List Items Addressed This Visit   None    No orders of the defined types were placed in this encounter.  No orders of the defined types were placed in this encounter.    Patient Instructions  Try adding claritin or allegra in the morning, and change zyrtec to at night time for 1-2 wks and see effect.  Buy preservative free lubricating eye drops.  Low cholesterol diet handout provided today.  Labs today Ativan refilled.   Follow up plan: No follow-ups on file.  [name], MD

## 2022-06-23 NOTE — Patient Instructions (Addendum)
Try adding claritin or allegra in the morning, and change zyrtec to at night time for 1-2 wks and see effect.  Buy preservative free lubricating eye drops.  Low cholesterol diet handout provided today.  Labs today Ativan refilled.

## 2022-06-24 DIAGNOSIS — Z888 Allergy status to other drugs, medicaments and biological substances status: Secondary | ICD-10-CM | POA: Insufficient documentation

## 2022-06-24 NOTE — Assessment & Plan Note (Signed)
Levels improving on low dose iron MWF (Ferrex 150). Update iron panel.

## 2022-06-24 NOTE — Assessment & Plan Note (Signed)
Update levels on regular 1000 IU daily.

## 2022-06-24 NOTE — Assessment & Plan Note (Signed)
Unclear cause. She had home water tested with normal results.  Continue zinc, continue to recommend low copper diet.  Ceruloplasmin when checked was not low.  24 hour urine copper levels were normal.

## 2022-06-24 NOTE — Assessment & Plan Note (Addendum)
Levels better on daily oral 1035mg replacement.

## 2022-06-24 NOTE — Assessment & Plan Note (Addendum)
Continue daily antihistamine + INS. Discussed trying BID antihistamine as per below.

## 2022-06-24 NOTE — Assessment & Plan Note (Signed)
Deterioration noted over the past month temporally related to increased work stressors.

## 2022-06-24 NOTE — Assessment & Plan Note (Addendum)
Reviewed recent derm testing. She is researching shoes that don't contain black rubber.  She finds antihistamine is beneficial - suggested trial taking BID.

## 2022-06-24 NOTE — Assessment & Plan Note (Addendum)
She has been working on [employer] changes. Update fasting triglyceride levels.

## 2022-06-29 DIAGNOSIS — R790 Abnormal level of blood mineral: Secondary | ICD-10-CM | POA: Diagnosis not present

## 2022-06-29 DIAGNOSIS — E781 Pure hyperglyceridemia: Secondary | ICD-10-CM | POA: Diagnosis not present

## 2022-06-29 DIAGNOSIS — R7879 Finding of abnormal level of heavy metals in blood: Secondary | ICD-10-CM | POA: Diagnosis not present

## 2022-06-29 DIAGNOSIS — E538 Deficiency of other specified B group vitamins: Secondary | ICD-10-CM | POA: Diagnosis not present

## 2022-06-29 DIAGNOSIS — E559 Vitamin D deficiency, unspecified: Secondary | ICD-10-CM | POA: Diagnosis not present

## 2022-06-30 DIAGNOSIS — M791 Myalgia, unspecified site: Secondary | ICD-10-CM | POA: Diagnosis not present

## 2022-06-30 DIAGNOSIS — R531 Weakness: Secondary | ICD-10-CM | POA: Diagnosis not present

## 2022-06-30 DIAGNOSIS — M2560 Stiffness of unspecified joint, not elsewhere classified: Secondary | ICD-10-CM | POA: Diagnosis not present

## 2022-06-30 DIAGNOSIS — M79602 Pain in left arm: Secondary | ICD-10-CM | POA: Diagnosis not present

## 2022-07-03 LAB — LIPID PANEL
Chol/HDL Ratio: 3.7 ratio (ref 0.0–4.4)
Cholesterol, Total: 189 mg/dL (ref 100–199)
HDL: 51 mg/dL (ref >39)
LDL Chol Calc (NIH): 84 mg/dL (ref 0–99)
Triglycerides: 331 mg/dL — ABNORMAL HIGH (ref 0–149)
VLDL Cholesterol Cal: 54 mg/dL — ABNORMAL HIGH (ref 5–40)

## 2022-07-03 LAB — IRON,TIBC AND FERRITIN PANEL
Ferritin: 26 ng/mL (ref 15–150)
Iron Saturation: 21 % (ref 15–55)
Iron: 100 ug/dL (ref 27–159)
Total Iron Binding Capacity: 473 ug/dL — ABNORMAL HIGH (ref 250–450)
UIBC: 373 ug/dL (ref 131–425)

## 2022-07-03 LAB — COPPER, SERUM: Copper: 228 ug/dL — ABNORMAL HIGH (ref 80–158)

## 2022-07-03 LAB — VITAMIN B12: Vitamin B-12: 1501 pg/mL — ABNORMAL HIGH (ref 232–1245)

## 2022-07-03 LAB — VITAMIN D 25 HYDROXY (VIT D DEFICIENCY, FRACTURES): Vit D, 25-Hydroxy: 43.1 ng/mL (ref 30.0–100.0)

## 2022-07-03 LAB — ZINC: Zinc: 88 ug/dL (ref 44–115)

## 2022-07-04 DIAGNOSIS — M791 Myalgia, unspecified site: Secondary | ICD-10-CM | POA: Diagnosis not present

## 2022-07-04 DIAGNOSIS — M2560 Stiffness of unspecified joint, not elsewhere classified: Secondary | ICD-10-CM | POA: Diagnosis not present

## 2022-07-04 DIAGNOSIS — R531 Weakness: Secondary | ICD-10-CM | POA: Diagnosis not present

## 2022-07-04 DIAGNOSIS — M79602 Pain in left arm: Secondary | ICD-10-CM | POA: Diagnosis not present

## 2022-07-05 ENCOUNTER — Other Ambulatory Visit: Payer: Self-pay | Admitting: Family Medicine

## 2022-07-05 MED ORDER — VITAMIN B-12 1000 MCG PO TABS: 1000.0000 ug | ORAL_TABLET | ORAL | Status: DC

## 2022-07-10 ENCOUNTER — Telehealth: Payer: Self-pay | Admitting: Family Medicine

## 2022-07-10 ENCOUNTER — Ambulatory Visit: Payer: BC Managed Care – PPO | Admitting: Family Medicine

## 2022-07-10 DIAGNOSIS — R790 Abnormal level of blood mineral: Secondary | ICD-10-CM

## 2022-07-10 DIAGNOSIS — R7879 Finding of abnormal level of heavy metals in blood: Secondary | ICD-10-CM

## 2022-07-10 MED ORDER — ZINC GLUCONATE 50 MG PO TABS: 50.0000 mg | ORAL_TABLET | Freq: Two times a day (BID) | ORAL | Status: DC

## 2022-07-10 NOTE — Telephone Encounter (Signed)
Patient scheduled with Einar Pheasant today at [facility], at the direction of the Dr. Einar Pheasant I contacted the patient to let her know it would be more appropriate to see her PCP  Patient is frustrated as she doesn't feel she is getting the support she needs for her labs that are abnormal and continue to increase  Transferred call to LPN in clinic for further triage and direction  Informed patient that Dr. Einar Pheasant is not currently accepting new patients

## 2022-07-10 NOTE — Telephone Encounter (Signed)
Spoke to patient by telephone and was advised that she tried to switch to Dr. Einar Pheasant last year as her PCP and was not able to. Patient stated that she was hoping to get a second opinion from Dr. Einar Pheasant because she has seen her before and really likes her. Patient was advised that Dr. Einar Pheasant is leaving the office in October. Patient stated that she went to her dermatologist office and had allergy test done but her doctor has been out on leave and has not been able to see her recently. Patient stated that she has not followed up with her dermatologist office because she does not like the other doctor there. Patient stated that her dermatologist is back next week and will be able to schedule an appointment with her. Patient was encouraged to call her dermatologist office and schedule a follow-up appointment there. .Patient stated that her vitamin B-12 has been high and her cooper levels are high also. Patient stated that something is going on with her body and feels like things are being over looked. Patient stated she wants some answers to why her blood levels are so off. Patient stated that she wants a referrals to someone that can her figure out what is going on. Patient stated that she was last seen by Dr. [name] 06/23/22. Patient was advised that this message will go back to Dr. [name] and we will give her a call back.

## 2022-07-10 NOTE — Telephone Encounter (Signed)
Her B12 levels are elevated because she was taking extra B12 - recommend holding x2 wks and then starting lower dose as discussed at [employer]. I'm not sure why her copper levels are elevated.  Would offer hematology referral for further evaluation of excess copper. If they don't see her, may refer her to GI. Hematology referral placed.  Would also suggest increasing zinc to '50mg'$  twice daily given copper levels remain high.

## 2022-07-11 NOTE — Telephone Encounter (Signed)
Pt call in wants to know if she needs to hold her medication before hematology appt. Please Advise

## 2022-07-11 NOTE — Telephone Encounter (Signed)
Pt returned your call . Would like a call back [phone]

## 2022-07-11 NOTE — Telephone Encounter (Signed)
Lvm rtn pt's call.  Need to relay Dr. Synthia Innocent message.

## 2022-07-11 NOTE — Telephone Encounter (Signed)
Spoke with pt relaying Dr. G's message. Pt verbalizes understanding.  

## 2022-07-11 NOTE — Telephone Encounter (Signed)
Lvm asking pt to call back.  Need to relay Dr. Synthia Innocent message.  Also, sending Dr. Synthia Innocent message to pt's MyChart.

## 2022-07-11 NOTE — Telephone Encounter (Signed)
No need to hold meds prior to hematology appointment.

## 2022-07-14 DIAGNOSIS — M791 Myalgia, unspecified site: Secondary | ICD-10-CM | POA: Diagnosis not present

## 2022-07-14 DIAGNOSIS — M79602 Pain in left arm: Secondary | ICD-10-CM | POA: Diagnosis not present

## 2022-07-14 DIAGNOSIS — M2560 Stiffness of unspecified joint, not elsewhere classified: Secondary | ICD-10-CM | POA: Diagnosis not present

## 2022-07-14 DIAGNOSIS — R531 Weakness: Secondary | ICD-10-CM | POA: Diagnosis not present

## 2022-07-18 ENCOUNTER — Encounter: Payer: Self-pay | Admitting: Dermatology

## 2022-07-18 ENCOUNTER — Ambulatory Visit: Payer: BC Managed Care – PPO | Admitting: Dermatology

## 2022-07-18 ENCOUNTER — Other Ambulatory Visit: Payer: Self-pay

## 2022-07-18 DIAGNOSIS — L858 Other specified epidermal thickening: Secondary | ICD-10-CM

## 2022-07-18 DIAGNOSIS — D2261 Melanocytic nevi of right upper limb, including shoulder: Secondary | ICD-10-CM | POA: Diagnosis not present

## 2022-07-18 DIAGNOSIS — M25541 Pain in joints of right hand: Secondary | ICD-10-CM

## 2022-07-18 DIAGNOSIS — L309 Dermatitis, unspecified: Secondary | ICD-10-CM

## 2022-07-18 DIAGNOSIS — B078 Other viral warts: Secondary | ICD-10-CM

## 2022-07-18 DIAGNOSIS — L739 Follicular disorder, unspecified: Secondary | ICD-10-CM

## 2022-07-18 DIAGNOSIS — D229 Melanocytic nevi, unspecified: Secondary | ICD-10-CM

## 2022-07-18 DIAGNOSIS — M25542 Pain in joints of left hand: Secondary | ICD-10-CM

## 2022-07-18 DIAGNOSIS — L719 Rosacea, unspecified: Secondary | ICD-10-CM

## 2022-07-18 MED ORDER — KETOCONAZOLE 2 % EX SHAM: MEDICATED_SHAMPOO | CUTANEOUS | 2 refills | Status: DC

## 2022-07-18 NOTE — Progress Notes (Signed)
Follow-Up Visit   Subjective  Caroline [name] is a 31 y.o. female who presents for the following: Skin Problem (Patient is having swelling with aching at hands and feet, started May 9th and has improved. She was having trouble walking and has had rashes at top of feet. Patient did have patch testing and results showed weak reactions to site #1 Nickel Sulfate, #3 Neomycin Sulfate, #14 Epoxy Resin, #16 black rubber mix /Information sheets given on weak reactions #1, #3, #14, #16).  Patient also with white patches at hands when they are wet, a mole at right shoulder she would like checked because physical therapist is concerned about it.   Patient has been using topical creams for KP but has not helped and is concerned it is really not KP.  Patient is also having some swelling at forehead.  The following portions of the chart were reviewed this encounter and updated as appropriate:   Tobacco  Allergies  Meds  Problems  Med Hx  Surg Hx  Fam Hx      Review of Systems:  No other skin or systemic complaints except as noted in HPI or Assessment and Plan.  Objective  Well appearing patient in no apparent distress; mood and affect are within normal limits.  A focused examination was performed including face, hands, right shoulder, arms. Relevant physical exam findings are noted in the Assessment and Plan.  b/l hands Scaly pink papules coalescing to plaques   right palm Cluster of 6 flat verruca at right palm  Right Posterior Shoulder 0.9 cm medium brown thin papule with perifollicular drop out, darker focus at 4 o'clock  upper arms Tiny follicular keratotic papules.   face Mid-face erythema and roughness    Assessment & Plan  Hand eczema b/l hands  Patient was given a sample of Opzelura but did not ever use it.  Start Opzelura twice daily to hands.  Other viral warts right palm  Discussed viral etiology and risk of spread.  Discussed multiple treatments may be  required to clear warts.  Discussed possible post-treatment dyspigmentation and risk of recurrence.  Prior to procedure, discussed risks of blister formation, small wound, skin dyspigmentation, or rare scar following cryotherapy. Recommend Vaseline ointment to treated areas while healing.   Destruction of lesion - right palm  Destruction method: cryotherapy   Informed consent: discussed and consent obtained   Lesion destroyed using liquid nitrogen: Yes   Cryotherapy cycles:  2 Outcome: patient tolerated procedure well with no complications   Post-procedure details: wound care instructions given    Nevus Right Posterior Shoulder  Benign-appearing.  Observation.  Call clinic for new or changing lesions.  Recommend daily use of broad spectrum spf 30+ sunscreen to sun-exposed areas.    Keratosis pilaris upper arms  Inflamed  Start mometasone 1-2 times daily for up to 2 weeks. Avoid applying to face, groin, and axilla. Use as directed. Long-term use can cause thinning of the skin.  Topical steroids (such as triamcinolone, fluocinolone, fluocinonide, mometasone, clobetasol, halobetasol, betamethasone, hydrocortisone) can cause thinning and lightening of the skin if they are used for too long in the same area. Your physician has selected the right strength medicine for your problem and area affected on the body. Please use your medication only as directed by your physician to prevent side effects.   Hold rough and bumpy cream while inflamed, then could try restarting and see if tolerated.   Folliculitis Scalp  Start ketoconazole 2% shampoo apply three times  per week, massage into scalp and leave in for 10 minutes before rinsing out    ketoconazole (NIZORAL) 2 % shampoo - Scalp apply three times per week, massage into scalp and leave in for 10 minutes before rinsing out  Rosacea face  Chronic and persistent condition with duration or expected duration over one year. Condition is  bothersome/symptomatic for patient. Currently flared.  Rosacea is a chronic progressive skin condition usually affecting the face of adults, causing redness and/or acne bumps. It is treatable but not curable. It sometimes affects the eyes (ocular rosacea) as well. It may respond to topical and/or systemic medication and can flare with stress, sun exposure, alcohol, exercise and some foods.  Daily application of broad spectrum spf 30+ sunscreen to face is recommended to reduce flares.  Restart Zilxi foam QD.  If not clearing, could consider skin medicinals combination rx topical.  Related Medications Minocycline HCl Micronized (ZILXI) 1.5 % FOAM Apply to face once daily for rosacea.  Arthralgia of both hands hands, feet  Patient reports pain and stiffness along with swelling at hands and feet, worst first thing in the morning. She reports difficulty making a fist.   Will refer to rheumatology for evaluation.   Return for 4-8 week follow up.  Graciella Belton, RMA, am acting as scribe for Forest Gleason, MD .   Documentation: I have reviewed the above documentation for accuracy and completeness, and I agree with the above.  Forest Gleason, MD

## 2022-07-18 NOTE — Patient Instructions (Addendum)
Cryotherapy Aftercare  Wash gently with soap and water everyday.   Apply Vaseline and Band-Aid daily until healed.   Start mometasone to arms (for KP) 1-2 times daily for up to 2 weeks. Avoid applying to face, groin, and axilla. Use as directed. Long-term use can cause thinning of the skin.  Topical steroids (such as triamcinolone, fluocinolone, fluocinonide, mometasone, clobetasol, halobetasol, betamethasone, hydrocortisone) can cause thinning and lightening of the skin if they are used for too long in the same area. Your physician has selected the right strength medicine for your problem and area affected on the body. Please use your medication only as directed by your physician to prevent side effects.   Start Opzelura once daily to hands/stubborn areas of eczema.  Continue Zilxi foam daily to face for rosacea.  Start ketoconazole 2% shampoo apply three times per week, massage into scalp and leave in for 10 minutes before rinsing out   https://www.skinsafeproducts.com/ - search for toothpastes with 100 top allergen score  Gentle Skin Care Guide  1. Bathe no more than once a day.  2. Avoid bathing in hot water  3. Use a mild soap like Dove, Vanicream, Cetaphil, CeraVe. Can use Lever 2000 or Cetaphil antibacterial soap  4. Use soap only where you need it. On most days, use it under your arms, between your legs, and on your feet. Let the water rinse other areas unless visibly dirty.  5. When you get out of the bath/shower, use a towel to gently blot your skin dry, don't rub it.  6. While your skin is still a little damp, apply a moisturizing cream such as Vanicream, CeraVe, Cetaphil, Eucerin, Sarna lotion or plain Vaseline Jelly. For hands apply Neutrogena [place] Hand Cream or Excipial Hand Cream.  7. Reapply moisturizer any time you start to itch or feel dry.  8. Sometimes using free and clear laundry detergents can be helpful. Fabric softener sheets should be avoided. Downy  Free & Gentle liquid, or any liquid fabric softener that is free of dyes and perfumes, it acceptable to use  9. If your doctor has given you prescription creams you may apply moisturizers over them   Due to recent changes in healthcare laws, you may see results of your pathology and/or laboratory studies on MyChart before the doctors have had a chance to review them. We understand that in some cases there may be results that are confusing or concerning to you. Please understand that not all results are received at the same time and often the doctors may need to interpret multiple results in order to provide you with the best plan of care or course of treatment. Therefore, we ask that you please give [place] 2 business days to thoroughly review all your results before contacting the office for clarification. Should we see a critical lab result, you will be contacted sooner.   If You Need Anything After Your Visit  If you have any questions or concerns for your doctor, please call our main line at [phone] and press option 4 to reach your doctor's medical assistant. If no one answers, please leave a voicemail as directed and we will return your call as soon as possible. Messages left after 4 pm will be answered the following business day.   You may also send [place] a message via [place]. We typically respond to MyChart messages within 1-2 business days.  For prescription refills, please ask your pharmacy to contact our office. Our fax number is [phone].  If you  have an urgent issue when the clinic is closed that cannot wait until the next business day, you can page your doctor at the number below.    Please note that while we do our best to be available for urgent issues outside of office hours, we are not available 24/7.   If you have an urgent issue and are unable to reach [place], you may choose to seek medical care at your doctor's office, retail clinic, urgent care center, or emergency room.  If  you have a medical emergency, please immediately call 911 or go to the emergency department.  Pager Numbers  - Dr. Nehemiah Massed: [phone]  - Dr. Laurence Ferrari: [phone]  - Dr. Nicole Kindred: [phone]  In the event of inclement weather, please call our main line at [phone] for an update on the status of any delays or closures.  Dermatology Medication Tips: Please keep the boxes that topical medications come in in order to help keep track of the instructions about where and how to use these. Pharmacies typically print the medication instructions only on the boxes and not directly on the medication tubes.   If your medication is too expensive, please contact our office at [phone] option 4 or send [place] a message through [place].   We are unable to tell what your co-pay for medications will be in advance as this is different depending on your insurance coverage. However, we may be able to find a substitute medication at lower cost or fill out paperwork to get insurance to cover a needed medication.   If a prior authorization is required to get your medication covered by your insurance company, please allow [place] 1-2 business days to complete this process.  Drug prices often vary depending on where the prescription is filled and some pharmacies may offer cheaper prices.  The website www.goodrx.com contains coupons for medications through different pharmacies. The prices here do not account for what the cost may be with help from insurance (it may be cheaper with your insurance), but the website can give you the price if you did not use any insurance.  - You can print the associated coupon and take it with your prescription to the pharmacy.  - You may also stop by our office during regular business hours and pick up a GoodRx coupon card.  - If you need your prescription sent electronically to a different pharmacy, notify our office through [facility] or by phone at [phone] option  4.     Si Usted Necesita Algo Despus de Su Visita  Tambin puede enviarnos un mensaje a travs de Pharmacist, community. Por lo general respondemos a los mensajes de MyChart en el transcurso de 1 a 2 das hbiles.  Para renovar recetas, por favor pida a su farmacia que se ponga en contacto con nuestra oficina. Harland Dingwall de fax es [place] [phone].  Si tiene un asunto urgente cuando la clnica est cerrada y que no puede esperar hasta el siguiente da hbil, puede llamar/localizar a su doctor(a) al nmero que aparece a continuacin.   Por favor, tenga en cuenta que aunque hacemos todo lo posible para estar disponibles para asuntos urgentes fuera del horario de [place], no estamos disponibles las 24 horas del da, los 7 das de la [place].   Si tiene un problema urgente y no puede comunicarse con nosotros, puede optar por buscar atencin mdica  en el consultorio de su doctor(a), en una clnica privada, en un centro de atencin urgente o en una sala  de emergencias.  Si tiene Engineering geologist, por favor llame inmediatamente al 911 o vaya a la sala de emergencias.  Nmeros de bper  - Dr. Nehemiah Massed: [phone]  - Dra. Moye: [phone]  - Dra. Nicole Kindred: [phone]  En caso de inclemencias del [place], por favor llame a Johnsie Kindred principal al [phone] para una actualizacin sobre el [place] de cualquier retraso o cierre.  Consejos para la medicacin en dermatologa: Por favor, guarde las cajas en las que vienen los medicamentos de uso tpico para ayudarle a seguir las instrucciones sobre dnde y cmo usarlos. Las farmacias generalmente imprimen las instrucciones del medicamento slo en las cajas y no directamente en los tubos del [place].   Si su medicamento es muy caro, por favor, pngase en contacto con Zigmund Daniel llamando al [phone] y presione la opcin 4 o envenos un mensaje a travs de Pharmacist, community.   No podemos decirle cul ser su copago por los medicamentos por  adelantado ya que esto es diferente dependiendo de la cobertura de su seguro. Sin embargo, es posible que podamos encontrar un medicamento sustituto a Electrical engineer un formulario para que el seguro cubra el medicamento que se considera necesario.   Si se requiere una autorizacin previa para que su compaa de seguros [place] su medicamento, por favor permtanos de 1 a 2 das hbiles para completar este proceso.  Los precios de los medicamentos varan con frecuencia dependiendo del Environmental consultant de dnde se surte la receta y alguna farmacias pueden ofrecer precios ms baratos.  El sitio web www.goodrx.com tiene cupones para medicamentos de Airline pilot. Los precios aqu no tienen en cuenta lo que podra costar con la ayuda del seguro (puede ser ms barato con su seguro), pero el sitio web puede darle el precio si no utiliz Research scientist (physical sciences).  - Puede imprimir el cupn correspondiente y llevarlo con su receta a la farmacia.  - Tambin puede pasar por nuestra oficina durante el horario de atencin regular y Charity fundraiser una tarjeta de cupones de GoodRx.  - Si necesita que su receta se enve electrnicamente a una farmacia diferente, informe a nuestra oficina a travs de MyChart de  o por telfono llamando al [phone] y presione la opcin 4.

## 2022-07-19 ENCOUNTER — Encounter: Payer: Self-pay | Admitting: Oncology

## 2022-07-19 ENCOUNTER — Inpatient Hospital Stay: Payer: BC Managed Care – PPO

## 2022-07-19 ENCOUNTER — Inpatient Hospital Stay: Payer: BC Managed Care – PPO | Attending: Oncology | Admitting: Oncology

## 2022-07-19 VITALS — BP 135/87 | HR 90 | Temp 97.4°F | Ht 67.0 in | Wt 152.0 lb

## 2022-07-19 DIAGNOSIS — E611 Iron deficiency: Secondary | ICD-10-CM | POA: Diagnosis not present

## 2022-07-19 DIAGNOSIS — R7879 Finding of abnormal level of heavy metals in blood: Secondary | ICD-10-CM

## 2022-07-19 DIAGNOSIS — D509 Iron deficiency anemia, unspecified: Secondary | ICD-10-CM

## 2022-07-19 DIAGNOSIS — Z8639 Personal history of other endocrine, nutritional and metabolic disease: Secondary | ICD-10-CM

## 2022-07-19 NOTE — Progress Notes (Signed)
Hematology/Oncology Consult note [phone] [phone]         Patient Care Team: [name], MD as PCP - General (Family Medicine) Earlie Server, MD as Consulting Physician (Oncology)  REFERRING PROVIDER: [name], MD   CHIEF COMPLAINTS/REASON FOR VISIT:  Evaluation of increased copper level, low iron level.  HISTORY OF PRESENTING ILLNESS:   Caroline [name] is a  31 y.o.  female with PMH listed below was seen in consultation at the request of  [name], MD  for evaluation of increased copper level, low iron level.  Patient has elevated copper level in the low 200s.  Unclear etiology.  Patient drinks city water.  Reports have had home water test which was within normal results. 10/31/2021 ceruloplasmin level 52.6.  Slightly elevated. 11/01/2021, urine copper level normal.  06/29/2022, iron level showed increased TIBC 473, ferritin 26, TIBC 473.  Patient reports history of heavy menstrual period in the past, menorrhagia has improved and now.  Denies any black stool or blood in the stool.  Denies any dietary restrictions.   She has taken oral iron supplementation.  Ferrex 150 mg 3 times per week.  She also takes zinc 50 mg daily. She has chronic constipation secondary to chronic inertia.  She follows up with [facility]. Reports sweating a lot, thyroid function was checked earlier this year before within normal limits. 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  Denies any occupational exposure currently.  Review of Systems  Constitutional:  Positive for fatigue. Negative for appetite change, chills and fever.  HENT:   Negative for hearing loss and voice change.   Eyes:  Negative for eye problems.  Respiratory:  Negative for chest tightness and cough.   Cardiovascular:  Negative for chest pain.  Gastrointestinal:  Negative for abdominal distention, abdominal pain and blood in stool.  Endocrine: Negative for hot flashes.  Genitourinary:   Negative for difficulty urinating and frequency.   Musculoskeletal:  Negative for arthralgias.  Skin:  Negative for itching and rash.  Neurological:  Negative for extremity weakness.  Hematological:  Negative for adenopathy.  Psychiatric/Behavioral:  Negative for confusion.     MEDICAL HISTORY:  Past Medical History:  Diagnosis Date   Colonic inertia 2007   Dr [name] ([facility])   Dysplastic nevus 11/19/2019   Left upper abdomen. Mild atypia.   History of chicken pox    Influenza B 11/19/2018   Seasonal allergic rhinitis     SURGICAL HISTORY: Past Surgical History:  Procedure Laterality Date   COLONOSCOPY  2010   WNL [name])   EXCISION MORTON'S NEUROMA Left 2007   WISDOM TOOTH EXTRACTION  2010    SOCIAL HISTORY: Social History   Socioeconomic History   Marital status: Single    Spouse name: Not on file   Number of children: 0   Years of education: 20   Highest education level: Bachelor's degree (e.g., BA, AB, BS)  Occupational History   Occupation: IT  Tobacco Use   Smoking status: Never   Smokeless tobacco: Never  Vaping Use   Vaping Use: Never used  Substance and Sexual Activity   Alcohol use: Yes    Comment: occasionally   Drug use: No   Sexual activity: Yes    Birth control/protection: None  Other Topics Concern   Not on file  Social History Narrative   Caffeine use: 1 cup coffee/day and some tea   Lives at home with mother, father, and sister and pets (horses,  ducks, chickens, fish, wild [place])   Occ: Neurosurgeon, works in Engineer, 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   Edu: Scientist, 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   Activity: Walking   Diet: good water, fruits/vegetables daily      Social Determinants of Radio broadcast assistant Strain: Not on file  Food Insecurity: Not on file  Transportation Needs: Not on file  Physical Activity: Not on file  Stress: Not on file  Social Connections: Not on file   Intimate Partner Violence: Not on file    FAMILY HISTORY: Family History  Problem Relation Age of Onset   Hypothyroidism Mother    Hypertension Mother    Liver disease Father    Colon cancer Father    Hypertension Father    Cancer Father        pancreas   Cirrhosis Father    COPD Father    Atrial fibrillation Father    Diabetes Father    Hypertension Maternal Grandmother    Heart failure Maternal Grandmother    Dementia Maternal Grandmother    Stroke Maternal Grandmother    Hypertension Paternal Grandmother    Cancer Paternal Grandmother        rare colon cancer   Colon cancer Paternal Aunt     ALLERGIES:  is allergic to amoxicillin, doxycycline, linzess [linaclotide], neomycin, and other.  MEDICATIONS:  Current Outpatient Medications  Medication Sig Dispense Refill   cetirizine (ZYRTEC) 10 MG tablet Take 10 mg by mouth daily as needed for allergies.     Cholecalciferol (VITAMIN D3) 25 MCG (1000 UT) CAPS Take 1 capsule by mouth daily.     iron polysaccharides (FERREX 150) 150 MG capsule Take 1 capsule (150 mg total) by mouth every Monday, Wednesday, and Friday. 40 capsule 3   LORazepam (ATIVAN) 0.5 MG tablet Take 0.5-1 tablets (0.25-0.5 mg total) by mouth at bedtime as needed for anxiety or sleep. 20 tablet 0   Multiple Vitamin (MULTIVITAMIN ADULT) TABS Take 1 tablet by mouth daily. Nature Made MVI for Her     Norethindrone Acetate-Ethinyl Estrad-FE (BLISOVI 24 FE) 1-20 MG-MCG(24) tablet Take 1 tablet by mouth daily.     polyethylene glycol (MIRALAX / GLYCOLAX) packet Take 17 g by mouth daily. As needed     triamcinolone (NASACORT) 55 MCG/ACT AERO nasal inhaler Place 2 sprays into the nose daily. 1 Inhaler    vitamin B-12 (CYANOCOBALAMIN) 1000 MCG tablet Take 1 tablet (1,000 mcg total) by mouth every Monday, Wednesday, and Friday.     zinc gluconate 50 MG tablet Take 1 tablet (50 mg total) by mouth in the morning and at bedtime.     diphenhydrAMINE (BENADRYL) 25 mg capsule  Take 50 mg by mouth every 6 (six) hours as needed for allergies or itching. (Patient not taking: Reported on 07/19/2022)     hydrocortisone 2.5 % ointment Apply BID to lips up to 2 weeks PRN (Patient not taking: Reported on 07/19/2022) 30 g 1   hydrOXYzine (ATARAX) 25 MG tablet  (Patient not taking: Reported on 07/19/2022)     ketoconazole (NIZORAL) 2 % shampoo apply three times per week, massage into scalp and leave in for 10 minutes before rinsing out (Patient not taking: Reported on 07/19/2022) 120 mL 2   Minocycline HCl Micronized (ZILXI) 1.5 % FOAM Apply to face once daily for rosacea. (Patient not taking: Reported on 07/19/2022) 30 g 2   mometasone (ELOCON) 0.1 % cream Apply once to twice daily  to itchy rash on chest, shoulders, back until improved. Avoid face, groin, underarms. (Patient not taking: Reported on 07/19/2022) 45 g 1   Ruxolitinib Phosphate (OPZELURA) 1.5 % CREA Apply to affected skin once a day (Patient not taking: Reported on 07/19/2022) 60 g 2   tacrolimus (PROTOPIC) 0.1 % ointment Apply to lips twice daily until improved. (Patient not taking: Reported on 07/19/2022) 30 g 1   No current facility-administered medications for this visit.     PHYSICAL EXAMINATION: ECOG PERFORMANCE STATUS: 0 - Asymptomatic Vitals:   07/19/22 1055  BP: 135/87  Pulse: 90  Temp: (!) 97.4 F (36.3 C)   Filed Weights   07/19/22 1055  Weight: 152 lb (68.9 kg)    Physical Exam Constitutional:      General: She is not in acute distress. HENT:     Head: Normocephalic and atraumatic.  Eyes:     General: No scleral icterus. Cardiovascular:     Rate and Rhythm: Normal rate and regular rhythm.     Heart sounds: Normal heart sounds.  Pulmonary:     Effort: Pulmonary effort is normal. No respiratory distress.     Breath sounds: No wheezing.  Abdominal:     General: Bowel sounds are normal. There is no distension.     Palpations: Abdomen is soft.  Musculoskeletal:        General: No deformity.  Normal range of motion.     Cervical back: Normal range of motion and neck supple.  Skin:    General: Skin is warm and dry.     Findings: No erythema or rash.  Neurological:     Mental Status: She is alert and oriented to person, place, and time. Mental status is at baseline.     Cranial Nerves: No cranial nerve deficit.     Coordination: Coordination normal.  Psychiatric:        Mood and Affect: Mood normal.     LABORATORY DATA:  I have reviewed the data as listed    Latest Ref Rng & Units 06/29/2021    8:14 AM 10/01/2020    1:24 PM 12/05/2019   11:18 AM  CBC  WBC 3.4 - 10.8 x10E3/uL 7.9  8.2  6.1   Hemoglobin 11.1 - 15.9 g/dL 13.9  12.8  12.2   Hematocrit 34.0 - 46.6 % 41.3  39.7  36.7   Platelets 150 - 450 x10E3/uL 285  284  237       Latest Ref Rng & Units 06/29/2021    8:14 AM 10/01/2020    1:24 PM 12/05/2019   11:18 AM  CMP  Glucose 65 - 99 mg/dL 84  71  89   BUN 6 - 20 mg/dL '9  9  8   '$ Creatinine 0.57 - 1.00 mg/dL 0.84  0.87  0.72   Sodium 134 - 144 mmol/L 141  134  141   Potassium 3.5 - 5.2 mmol/L 4.4  3.9  4.0   Chloride 96 - 106 mmol/L 104  100  105   CO2 20 - 29 mmol/L '21  23  23   '$ Calcium 8.7 - 10.2 mg/dL 9.6  9.7  9.4   Total Protein 6.0 - 8.5 g/dL 6.7   6.6   Total Bilirubin 0.0 - 1.2 mg/dL 0.3   0.4   Alkaline Phos 44 - 121 IU/L 61   71   AST 0 - 40 IU/L 13   15   ALT 0 - 32 IU/L 7  4      RADIOGRAPHIC STUDIES: I have personally reviewed the radiological images as listed and agreed with the findings in the report. No results found.     ASSESSMENT & PLAN:   History of iron deficiency Labs reviewed and discussed with patient. Patient has normal ferritin level and iron saturation. Slightly elevated TIBC. Check celiac panel. Currently she is on Ferrex 3 times daily.  With her normal ferritin and iron saturation, I think she may stop oral iron supplementation.  Encourage iron rich food. She does not have menorrhagia.  If no malabsorption, recommend  GI work-up for possible GI blood loss.  High blood copper level Etiology is unknown.?  Increased uptake from food or water supply, vs inflammatory process, chronic liver disease, malignancy, etc.  Patient has no constitutional symptoms. Currently ceruloplasmin level is elevated, normal 24-hour urine copper testing not consistent with Wilson disease. Recommend patient to further discuss with gastroenterology for further evaluation. Avoid copper rich food.  Continue zinc supplementation. Consider future toxicology evaluation if copper level progressively increases.   No orders of the defined types were placed in this encounter. Lab prescription of CBC, CMP, celiac panel was provided to patient. Follow-up as needed. All questions were answered. The patient knows to call the clinic with any problems, questions or concerns.  Earlie Server, MD, PhD 07/19/2022     No orders of the defined types were placed in this encounter.   All questions were answered. The patient knows to call the clinic with any problems questions or concerns.   [name], MD    Return of visit:  Thank you for this kind referral and the opportunity to participate in the care of this patient. A copy of today's note is routed to referring provider   Earlie Server, MD, PhD [facility] Hematology Oncology 07/19/2022

## 2022-07-19 NOTE — Assessment & Plan Note (Signed)
Labs reviewed and discussed with patient. Patient has normal ferritin level and iron saturation. Slightly elevated TIBC. Check celiac panel. Currently she is on Ferrex 3 times daily.  With her normal ferritin and iron saturation, I think she may stop oral iron supplementation.  Encourage iron rich food. She does not have menorrhagia.  If no malabsorption, recommend GI work-up for possible GI blood loss.

## 2022-07-19 NOTE — Assessment & Plan Note (Addendum)
Etiology is unknown.?  Increased uptake from food or water supply, vs inflammatory process, chronic liver disease, malignancy, etc.  Patient has no constitutional symptoms. Currently ceruloplasmin level is elevated, normal 24-hour urine copper testing not consistent with Wilson disease. Recommend patient to further discuss with gastroenterology for further evaluation. Avoid copper rich food.  Continue zinc supplementation. Consider future toxicology evaluation if copper level progressively increases.

## 2022-07-20 DIAGNOSIS — R531 Weakness: Secondary | ICD-10-CM | POA: Diagnosis not present

## 2022-07-20 DIAGNOSIS — M791 Myalgia, unspecified site: Secondary | ICD-10-CM | POA: Diagnosis not present

## 2022-07-20 DIAGNOSIS — M2560 Stiffness of unspecified joint, not elsewhere classified: Secondary | ICD-10-CM | POA: Diagnosis not present

## 2022-07-20 DIAGNOSIS — M79602 Pain in left arm: Secondary | ICD-10-CM | POA: Diagnosis not present

## 2022-07-21 ENCOUNTER — Encounter: Payer: Self-pay | Admitting: Oncology

## 2022-07-24 ENCOUNTER — Telehealth: Payer: Self-pay

## 2022-07-24 NOTE — Telephone Encounter (Signed)
Informed pt via Mychart. Will send [facility] order form to pt.

## 2022-07-24 NOTE — Telephone Encounter (Signed)
-----   Message from Earlie Server, MD sent at 07/23/2022 11:10 PM EDT ----- Please let patient know that her blood work showed negative celiac panel. Cbc showed no anemia.  Her immunoglobulin level is low, recommend checking additional labs.  Please provide her [facility] Rx - Immunoglobulins, Quantitative, IgA, IgG, IgM [id number]

## 2022-07-25 ENCOUNTER — Telehealth: Payer: Self-pay

## 2022-07-25 ENCOUNTER — Ambulatory Visit: Payer: BC Managed Care – PPO | Admitting: Family Medicine

## 2022-07-25 VITALS — BP 116/68 | HR 94 | Ht 67.0 in | Wt 151.4 lb

## 2022-07-25 DIAGNOSIS — Z23 Encounter for immunization: Secondary | ICD-10-CM

## 2022-07-25 DIAGNOSIS — T63441A Toxic effect of venom of bees, accidental (unintentional), initial encounter: Secondary | ICD-10-CM | POA: Insufficient documentation

## 2022-07-25 NOTE — Assessment & Plan Note (Signed)
Yellow jacket sting on L posterior arm  Mild local rxn No systemic symptoms  Disc care- keep clean and dry  Cool compress  Antihistamine if needed for itch Disc s/s of anaphylaxis to watch for (mouth or throat swelling , if severe go to ER)  Update if not starting to improve in a week or if worsening   Td updated today

## 2022-07-25 NOTE — Telephone Encounter (Signed)
Called Dr. Patel/Rheumatology to check on status of referral. Patient was scheduled in March but cancelled and then they did reach out to patient last Friday, 07/28, regarding updated referral and left msg for patient to return call to schedule. aw

## 2022-07-25 NOTE — Progress Notes (Signed)
   Subjective:    Patient ID: Caroline [name], female    DOB: [birth year], 31 y.o.   MRN: [id number]  HPI 31 yo pt of Dr Darnell Level presents with insect sting   Wt Readings from Last 3 Encounters:  07/25/22 151 lb 6.4 oz (68.7 kg)  07/19/22 152 lb (68.9 kg)  06/23/22 151 lb 12.8 oz (68.9 kg)   23.71 kg/m  Yellow jacket sting (only got her once) -in hallway after her mother got stung   Her mom got 12 stings unfortunately -week eating   Back of arm Itchy  Not too painful   A little nausea-may not be related  Some fatigue- took benadryl   (also taking care of her mother)   No swelling in mouth or throat   Arm is a little swollen     Her tetanus shot is out of date      Review of Systems     Objective:   Physical Exam Constitutional:      General: She is not in acute distress.    Appearance: Normal appearance. She is normal weight. She is not ill-appearing.  HENT:     Head: Normocephalic and atraumatic.     Mouth/Throat:     Mouth: Mucous membranes are moist.     Pharynx: Oropharynx is clear.     Comments: No mouth or throat swelling  Cardiovascular:     Rate and Rhythm: Normal rate and regular rhythm.     Heart sounds: Normal heart sounds.  Pulmonary:     Effort: Pulmonary effort is normal. No respiratory distress.     Breath sounds: Normal breath sounds. No wheezing or rales.  Skin:    General: Skin is warm and dry.     Findings: No bruising or rash.     Comments: 1.5 by 1.5 area of mild induration and warmth on posterior L arm  Pink in color  Mildly tender  No scab or opening   Neurological:     Mental Status: She is alert.  Psychiatric:        Mood and Affect: Mood normal.           Assessment & Plan:   Problem List Items Addressed This Visit       Other   Bee sting    Yellow jacket sting on L posterior arm  Mild local rxn No systemic symptoms  Disc care- keep clean and dry  Cool compress  Antihistamine if needed for itch Disc s/s of  anaphylaxis to watch for (mouth or throat swelling , if severe go to ER)  Update if not starting to improve in a week or if worsening   Td updated today

## 2022-07-25 NOTE — Patient Instructions (Signed)
Keep the sting area clean and dry  Use cold compress  If worse let us know    Tetanus shot today   Hire someone to get the yellow jacket nests off your property

## 2022-08-01 DIAGNOSIS — D509 Iron deficiency anemia, unspecified: Secondary | ICD-10-CM | POA: Diagnosis not present

## 2022-08-02 ENCOUNTER — Encounter: Payer: Self-pay | Admitting: Oncology

## 2022-08-04 DIAGNOSIS — M2560 Stiffness of unspecified joint, not elsewhere classified: Secondary | ICD-10-CM | POA: Diagnosis not present

## 2022-08-04 DIAGNOSIS — M791 Myalgia, unspecified site: Secondary | ICD-10-CM | POA: Diagnosis not present

## 2022-08-04 DIAGNOSIS — M79602 Pain in left arm: Secondary | ICD-10-CM | POA: Diagnosis not present

## 2022-08-04 DIAGNOSIS — R531 Weakness: Secondary | ICD-10-CM | POA: Diagnosis not present

## 2022-08-07 ENCOUNTER — Telehealth: Payer: Self-pay

## 2022-08-07 DIAGNOSIS — D802 Selective deficiency of immunoglobulin A [IgA]: Secondary | ICD-10-CM

## 2022-08-07 NOTE — Telephone Encounter (Signed)
Referral faxed to [facility] @ [phone] Ph: [phone]

## 2022-08-07 NOTE — Telephone Encounter (Signed)
Mychart message sent to inform pt of recommendation. Will send referral to [facility].

## 2022-08-07 NOTE — Telephone Encounter (Signed)
-----   Message from Earlie Server, MD sent at 08/06/2022 11:47 AM EDT ----- Please let patient know that she has normal IgG and IgM level however her IgA level is decreased.  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  I recommend patient to establish care with immunologist for further evaluation.  Please refer to immunology.

## 2022-08-10 DIAGNOSIS — R531 Weakness: Secondary | ICD-10-CM | POA: Diagnosis not present

## 2022-08-10 DIAGNOSIS — M79602 Pain in left arm: Secondary | ICD-10-CM | POA: Diagnosis not present

## 2022-08-10 DIAGNOSIS — M2560 Stiffness of unspecified joint, not elsewhere classified: Secondary | ICD-10-CM | POA: Diagnosis not present

## 2022-08-10 DIAGNOSIS — M791 Myalgia, unspecified site: Secondary | ICD-10-CM | POA: Diagnosis not present

## 2022-08-11 ENCOUNTER — Encounter: Payer: Self-pay | Admitting: Family Medicine

## 2022-08-11 ENCOUNTER — Ambulatory Visit: Payer: BC Managed Care – PPO | Admitting: Family Medicine

## 2022-08-11 VITALS — BP 116/74 | HR 84 | Temp 97.7°F | Ht 67.0 in | Wt 151.5 lb

## 2022-08-11 DIAGNOSIS — R790 Abnormal level of blood mineral: Secondary | ICD-10-CM

## 2022-08-11 DIAGNOSIS — R5382 Chronic fatigue, unspecified: Secondary | ICD-10-CM | POA: Diagnosis not present

## 2022-08-11 DIAGNOSIS — D802 Selective deficiency of immunoglobulin A [IgA]: Secondary | ICD-10-CM

## 2022-08-11 DIAGNOSIS — J0100 Acute maxillary sinusitis, unspecified: Secondary | ICD-10-CM

## 2022-08-11 DIAGNOSIS — J302 Other seasonal allergic rhinitis: Secondary | ICD-10-CM | POA: Diagnosis not present

## 2022-08-11 DIAGNOSIS — K599 Functional intestinal disorder, unspecified: Secondary | ICD-10-CM

## 2022-08-11 DIAGNOSIS — R7879 Finding of abnormal level of heavy metals in blood: Secondary | ICD-10-CM

## 2022-08-11 MED ORDER — AMOXICILLIN 500 MG PO CAPS: 500.0000 mg | ORAL_CAPSULE | Freq: Three times a day (TID) | ORAL | 0 refills | Status: AC

## 2022-08-11 MED ORDER — FLUCONAZOLE 150 MG PO TABS: 150.0000 mg | ORAL_TABLET | Freq: Once | ORAL | 0 refills | Status: AC

## 2022-08-11 NOTE — Assessment & Plan Note (Signed)
Anticipate bacterial sinusitis given duration and progression of symptoms. She's previously responded well to low dose amoxicillin TID. No recent abx use. Rx amox '500mg'$  TID x 10 days, discussed ok to stop at 7d if symptoms resolved by then. Supportive measures reviewed.

## 2022-08-11 NOTE — Assessment & Plan Note (Signed)
Anticipate worsened with current sinus infection.

## 2022-08-11 NOTE — Progress Notes (Signed)
Patient ID: Caroline [name], female    DOB: [birth year], 31 y.o.   MRN: [id number]  This visit was conducted in person.  BP 116/74   Pulse 84   Temp 97.7 F (36.5 C) (Temporal)   Ht '5\' 7"'$  (1.702 m)   Wt 151 lb 8 oz (68.7 kg)   LMP 07/24/2022   SpO2 96%   BMI 23.73 kg/m    CC: sinus congestion  Subjective:   HPI: Caroline [name] is a 31 y.o. female presenting on 08/11/2022 for Sinus Problem (C/o nasal congestion, drainage, ear fullness and fatigue.  Sxs started about 2.5 wks ago. Also, c/o tremors, occasional HR issues, bruising, muscle weakness and light sensitivity. )   2.5 wk h/o nasal congestion, PNdrainage, ear fullness, hearing comes and goes, fatigue. Some chills and night sweats. Mild ST/hoaresness.  No fevers, cough, chest congestion or wheezing/dyspnea.  Tried OTC remedies without improvement.  Currently alternating between claritin and zyrtec, as well as nasacort nightly.  No recent abx use.   Ongoing tremors, tachy-palpitations, easy bruising, muscle weakness and sensitivity to light. Just 2 caffeinated beverages/day.   Saw hematology for iron deficiency - found to have low IgA levels - referred to allergist for evaluation of IgA deficiency. Tested negative for celiac panel, CBC without anemia. Regarding high copper levels - rec f/u with GI (appt Oct), avoid copper rich goods, continue zinc supplementation, consider future toxicology eval if persistent elevation. Advised stop iron and b12.   Saw dermatology - referred to rheumatologist (next week), appt pending. Recently completed patch allergy testing - weakly positive responses to nickel, neomycin, epoxy resin, black rubber mix.   Saw Dr Glori Bickers 2 wks ago after yellow jacket sting - Td updated, rec cool compresses and antihistamine PRN.      Relevant past medical, surgical, family and social history reviewed and updated as indicated. Interim medical history since our last visit reviewed. Allergies and medications  reviewed and updated. Outpatient Medications Prior to Visit  Medication Sig Dispense Refill   cetirizine (ZYRTEC) 10 MG tablet Take 10 mg by mouth daily as needed for allergies.     Cholecalciferol (VITAMIN D3) 25 MCG (1000 UT) CAPS Take 1 capsule by mouth daily.     diphenhydrAMINE (BENADRYL) 25 mg capsule Take 50 mg by mouth every 6 (six) hours as needed for allergies or itching.     hydrocortisone 2.5 % ointment Apply BID to lips up to 2 weeks PRN 30 g 1   ketoconazole (NIZORAL) 2 % shampoo apply three times per week, massage into scalp and leave in for 10 minutes before rinsing out 120 mL 2   LORazepam (ATIVAN) 0.5 MG tablet Take 0.5-1 tablets (0.25-0.5 mg total) by mouth at bedtime as needed for anxiety or sleep. 20 tablet 0   Minocycline HCl Micronized (ZILXI) 1.5 % FOAM Apply to face once daily for rosacea. 30 g 2   mometasone (ELOCON) 0.1 % cream Apply once to twice daily to itchy rash on chest, shoulders, back until improved. Avoid face, groin, underarms. 45 g 1   Multiple Vitamin (MULTIVITAMIN ADULT) TABS Take 1 tablet by mouth daily. Nature Made MVI for Her     Norethindrone Acetate-Ethinyl Estrad-FE (BLISOVI 24 FE) 1-20 MG-MCG(24) tablet Take 1 tablet by mouth daily.     polyethylene glycol (MIRALAX / GLYCOLAX) packet Take 17 g by mouth daily. As needed     Ruxolitinib Phosphate (OPZELURA) 1.5 % CREA Apply to affected skin once a day 60 g  2   tacrolimus (PROTOPIC) 0.1 % ointment Apply to lips twice daily until improved. 30 g 1   triamcinolone (NASACORT) 55 MCG/ACT AERO nasal inhaler Place 2 sprays into the nose daily. 1 Inhaler    hydrOXYzine (ATARAX) 25 MG tablet      iron polysaccharides (FERREX 150) 150 MG capsule Take 1 capsule (150 mg total) by mouth every Monday, Wednesday, and Friday. (Patient not taking: Reported on 07/25/2022) 40 capsule 3   vitamin B-12 (CYANOCOBALAMIN) 1000 MCG tablet Take 1 tablet (1,000 mcg total) by mouth every Monday, Wednesday, and Friday. (Patient not  taking: Reported on 07/25/2022)     zinc gluconate 50 MG tablet Take 1 tablet (50 mg total) by mouth in the morning and at bedtime. (Patient not taking: Reported on 07/25/2022)     No facility-administered medications prior to visit.     Per HPI unless specifically indicated in ROS section below Review of Systems  Objective:  BP 116/74   Pulse 84   Temp 97.7 F (36.5 C) (Temporal)   Ht '5\' 7"'$  (1.702 m)   Wt 151 lb 8 oz (68.7 kg)   LMP 07/24/2022   SpO2 96%   BMI 23.73 kg/m   Wt Readings from Last 3 Encounters:  08/11/22 151 lb 8 oz (68.7 kg)  07/25/22 151 lb 6.4 oz (68.7 kg)  07/19/22 152 lb (68.9 kg)      Physical Exam Vitals and nursing note reviewed.  Constitutional:      Appearance: Normal appearance. She is not ill-appearing.  HENT:     Head: Normocephalic and atraumatic.     Right Ear: Tympanic membrane, ear canal and external ear normal. There is no impacted cerumen.     Left Ear: Tympanic membrane, ear canal and external ear normal. There is no impacted cerumen.     Nose: No congestion or rhinorrhea.     Right Turbinates: Not enlarged, swollen or pale.     Left Turbinates: Not enlarged, swollen or pale.     Right Sinus: Maxillary sinus tenderness (pressure) present. No frontal sinus tenderness.     Left Sinus: Maxillary sinus tenderness (pressure) present. No frontal sinus tenderness.     Mouth/Throat:     Mouth: Mucous membranes are moist.     Pharynx: Oropharynx is clear. No oropharyngeal exudate or posterior oropharyngeal erythema.  Eyes:     Extraocular Movements: Extraocular movements intact.     Conjunctiva/sclera: Conjunctivae normal.     Pupils: Pupils are equal, round, and reactive to light.  Neck:     Thyroid: No thyroid mass, thyromegaly or thyroid tenderness.  Cardiovascular:     Rate and Rhythm: Normal rate and regular rhythm.     Pulses: Normal pulses.     Heart sounds: Normal heart sounds. No murmur heard. Pulmonary:     Effort: Pulmonary effort  is normal. No respiratory distress.     Breath sounds: Normal breath sounds. No wheezing, rhonchi or rales.  Lymphadenopathy:     Head:     Right side of head: No submental, submandibular, tonsillar, preauricular or posterior auricular adenopathy.     Left side of head: No submental, submandibular, tonsillar, preauricular or posterior auricular adenopathy.     Cervical: No cervical adenopathy.     Right cervical: No superficial cervical adenopathy.    Left cervical: No superficial cervical adenopathy.     Upper Body:     Right upper body: No supraclavicular adenopathy.     Left upper body: No supraclavicular  adenopathy.  Skin:    Findings: No rash.  Neurological:     Mental Status: She is alert.  Psychiatric:        Mood and Affect: Mood normal.        Behavior: Behavior normal.       Results for orders placed or performed in visit on 06/23/22  Zinc  Result Value Ref Range   Zinc 88 44 - 115 ug/dL  VITAMIN D 25 Hydroxy (Vit-D Deficiency, Fractures)  Result Value Ref Range   Vit D, 25-Hydroxy 43.1 30.0 - 100.0 ng/mL  Iron, TIBC and Ferritin Panel  Result Value Ref Range   Total Iron Binding Capacity 473 (H) 250 - 450 ug/dL   UIBC 373 131 - 425 ug/dL   Iron 100 27 - 159 ug/dL   Iron Saturation 21 15 - 55 %   Ferritin 26 15 - 150 ng/mL  Vitamin B12  Result Value Ref Range   Vitamin B-12 1,501 (H) 232 - 1,245 pg/mL  Lipid panel  Result Value Ref Range   Cholesterol, Total 189 100 - 199 mg/dL   Triglycerides 331 (H) 0 - 149 mg/dL   HDL 51 >39 mg/dL   VLDL Cholesterol Cal 54 (H) 5 - 40 mg/dL   LDL Chol Calc (NIH) 84 0 - 99 mg/dL   Chol/HDL Ratio 3.7 0.0 - 4.4 ratio  Copper, Serum  Result Value Ref Range   Copper 228 (H) 80 - 158 ug/dL    Assessment & Plan:   Problem List Items Addressed This Visit     Colonic inertia   Seasonal allergic rhinitis    Continue daily antihistamine + INS.       Acute sinusitis - Primary    Anticipate bacterial sinusitis given  duration and progression of symptoms. She's previously responded well to low dose amoxicillin TID. No recent abx use. Rx amox '500mg'$  TID x 10 days, discussed ok to stop at 7d if symptoms resolved by then. Supportive measures reviewed.       Relevant Medications   amoxicillin (AMOXIL) 500 MG capsule   fluconazole (DIFLUCAN) 150 MG tablet   Chronic fatigue    Anticipate worsened with current sinus infection.       High blood copper level   Low iron stores    Saw heme - ok to stop oral iron replacement given elevated TIBC, adequate ferritin levels. ?GI absorption issue.      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         Meds ordered this encounter  Medications   amoxicillin (AMOXIL) 500 MG capsule    Sig: Take 1 capsule (500 mg total) by mouth 3 (three) times daily for 10 days.    Dispense:  30 capsule    Refill:  0   fluconazole (DIFLUCAN) 150 MG tablet    Sig: Take 1 tablet (150 mg total) by mouth once for 1 dose. Take 2nd dose 4 days later if needed    Dispense:  2 tablet    Refill:  0   No orders of the defined types were placed in this encounter.    Patient Instructions  You may call  Allergy and Asthma. Ph: [phone] Will await rheumatology evaluation.  Treat sinus infection with amoxicillin three times daily for 10 days, let us know if ongoing symptoms despite treatment.  Continue nasacort, antihistamine, fluids, rest.   Follow up plan: Return if symptoms worsen or fail to improve.  Garlon Hatchet  [name], MD

## 2022-08-11 NOTE — Patient Instructions (Addendum)
You may call [facility]. Ph: [phone] Will await rheumatology evaluation.  Treat sinus infection with amoxicillin three times daily for 10 days, let us know if ongoing symptoms despite treatment.  Continue nasacort, antihistamine, fluids, rest.

## 2022-08-11 NOTE — Assessment & Plan Note (Signed)
Saw heme - ok to stop oral iron replacement given elevated TIBC, adequate ferritin levels. ?GI absorption issue.

## 2022-08-11 NOTE — Telephone Encounter (Signed)
Fax failed. Referral emailed to [email or web address]

## 2022-08-11 NOTE — Assessment & Plan Note (Signed)
Continue daily antihistamine + INS.

## 2022-08-11 NOTE — Assessment & Plan Note (Signed)
Newly noted - pending allergy eval.

## 2022-08-15 DIAGNOSIS — M79602 Pain in left arm: Secondary | ICD-10-CM | POA: Diagnosis not present

## 2022-08-15 DIAGNOSIS — M7989 Other specified soft tissue disorders: Secondary | ICD-10-CM | POA: Diagnosis not present

## 2022-08-15 DIAGNOSIS — M2560 Stiffness of unspecified joint, not elsewhere classified: Secondary | ICD-10-CM | POA: Diagnosis not present

## 2022-08-15 DIAGNOSIS — R531 Weakness: Secondary | ICD-10-CM | POA: Diagnosis not present

## 2022-08-15 DIAGNOSIS — M791 Myalgia, unspecified site: Secondary | ICD-10-CM | POA: Diagnosis not present

## 2022-08-15 DIAGNOSIS — R5382 Chronic fatigue, unspecified: Secondary | ICD-10-CM | POA: Diagnosis not present

## 2022-08-15 DIAGNOSIS — M2550 Pain in unspecified joint: Secondary | ICD-10-CM | POA: Diagnosis not present

## 2022-08-17 DIAGNOSIS — M79602 Pain in left arm: Secondary | ICD-10-CM | POA: Diagnosis not present

## 2022-08-17 DIAGNOSIS — R531 Weakness: Secondary | ICD-10-CM | POA: Diagnosis not present

## 2022-08-17 DIAGNOSIS — M791 Myalgia, unspecified site: Secondary | ICD-10-CM | POA: Diagnosis not present

## 2022-08-17 DIAGNOSIS — M2560 Stiffness of unspecified joint, not elsewhere classified: Secondary | ICD-10-CM | POA: Diagnosis not present

## 2022-08-22 ENCOUNTER — Ambulatory Visit: Payer: BC Managed Care – PPO | Admitting: Dermatology

## 2022-08-22 DIAGNOSIS — L739 Follicular disorder, unspecified: Secondary | ICD-10-CM

## 2022-08-22 DIAGNOSIS — B079 Viral wart, unspecified: Secondary | ICD-10-CM | POA: Diagnosis not present

## 2022-08-22 DIAGNOSIS — L858 Other specified epidermal thickening: Secondary | ICD-10-CM

## 2022-08-22 DIAGNOSIS — L719 Rosacea, unspecified: Secondary | ICD-10-CM

## 2022-08-22 DIAGNOSIS — L219 Seborrheic dermatitis, unspecified: Secondary | ICD-10-CM

## 2022-08-22 DIAGNOSIS — L309 Dermatitis, unspecified: Secondary | ICD-10-CM

## 2022-08-22 MED ORDER — RHOFADE 1 % EX CREA: TOPICAL_CREAM | CUTANEOUS | 2 refills | Status: DC

## 2022-08-22 NOTE — Progress Notes (Unsigned)
Follow-Up Visit   Subjective  Caroline [name] is a 31 y.o. female who presents for the following: Follow-up (Patient here today for 4 week follow up. She has been using mometasone at arms for KP and has noticed some improvement. ), Warts (At right palm treated with LN2. Patient advises some did come off but she's not sure if totally resolved. ), Eczema (Using Opzelura at hands. Patient has not noticed much improvement. ), Rosacea (Using Zilixi, redness will get better but then comes back. Patient has used Soolantra in the past but did not help and made patient dry.), and folliculitis (She is currently using ketoconazole shampoo as needed. Scalp is much better.).  The following portions of the chart were reviewed this encounter and updated as appropriate:   Tobacco  Allergies  Meds  Problems  Med Hx  Surg Hx  Fam Hx      Review of Systems:  No other skin or systemic complaints except as noted in HPI or Assessment and Plan.  Objective  Well appearing patient in no apparent distress; mood and affect are within normal limits.  A focused examination was performed including face, arms, hands. Relevant physical exam findings are noted in the Assessment and Plan.  right palm Clear   arms Tiny follicular keratotic papules.   face Erythema   Scalp Perifollicular erythematous papules and pustules   Right 2nd Finger Tip Scaly pink plaque with fissure  eyebrows Pink scaly patches    Assessment & Plan  Viral warts, unspecified type right palm  Observe for recurrence   Keratosis pilaris arms  Benign, reassured. Chronic, no cure.  Avoid amlactin and salicylic acid containing products as that caused irritation in the past.   May restart mometasone cream 1-2 times daily to arms as needed occasionally for itching. Avoid applying to face, groin, and axilla. Use as directed. Long-term use can cause thinning of the skin.  Topical steroids (such as triamcinolone, fluocinolone,  fluocinonide, mometasone, clobetasol, halobetasol, betamethasone, hydrocortisone) can cause thinning and lightening of the skin if they are used for too long in the same area. Your physician has selected the right strength medicine for your problem and area affected on the body. Please use your medication only as directed by your physician to prevent side effects.    Rosacea face  Chronic and persistent condition with duration or expected duration over one year. Condition is symptomatic/ bothersome to patient. Not currently at goal.  Pt with intermittent bumps despite Zilxi.  She did not do well with Soolantra.   Continue Zilixi increasing to twice daily.  Start Rhofade to face in morning followed by Rhofade.  Patient allergic to doxycycline.   Consider azelaic acid in future  Rosacea is a chronic progressive skin condition usually affecting the face of adults, causing redness and/or acne bumps. It is treatable but not curable. It sometimes affects the eyes (ocular rosacea) as well. It may respond to topical and/or systemic medication and can flare with stress, sun exposure, alcohol, exercise and some foods.  Daily application of broad spectrum spf 30+ sunscreen to face is recommended to reduce flares.   Oxymetazoline HCl (RHOFADE) 1 % CREA - face Apply thin layer to face in am.  Folliculitis Scalp  Continue ketoconazole 2% up to apply three times per week, massage into scalp and leave in for 10 minutes before rinsing out as needed.   Related Medications ketoconazole (NIZORAL) 2 % shampoo apply three times per week, massage into scalp and leave in  for 10 minutes before rinsing out  Hand dermatitis Right 2nd Finger Tip  Chronic and persistent condition with duration or expected duration over one year. Condition is bothersome/symptomatic for patient. Currently flared.  Start mometasone 2 times daily for up to 2 weeks to area at right 2nd fingertip. Avoid applying to face, groin,  and axilla. Use as directed. Long-term use can cause thinning of the skin.  Topical steroids (such as triamcinolone, fluocinolone, fluocinonide, mometasone, clobetasol, halobetasol, betamethasone, hydrocortisone) can cause thinning and lightening of the skin if they are used for too long in the same area. Your physician has selected the right strength medicine for your problem and area affected on the body. Please use your medication only as directed by your physician to prevent side effects.   Reviewed gentle skin care Recommend vaseline or aquaphor overnight.   Seborrheic dermatitis eyebrows  Start ketoconazole 2% shampoo as body wash to eyebrows and forehead  Chronic and persistent condition with duration or expected duration over one year. Condition is symptomatic/ bothersome to patient. Not currently at goal.    Return for TBSE, as scheduled.  Graciella Belton, RMA, am acting as scribe for Forest Gleason, MD .  Documentation: I have reviewed the above documentation for accuracy and completeness, and I agree with the above.  Forest Gleason, MD

## 2022-08-22 NOTE — Patient Instructions (Addendum)
May continue mometasone cream 1-2 times daily to arms as needed for itching. Avoid applying to face, groin, and axilla. Use as directed. Long-term use can cause thinning of the skin.  Start mometasone 2 times daily for up to 2 weeks to area at right 2nd fingertip. Avoid applying to face, groin, and axilla. Use as directed. Long-term use can cause thinning of the skin.  Topical steroids (such as triamcinolone, fluocinolone, fluocinonide, mometasone, clobetasol, halobetasol, betamethasone, hydrocortisone) can cause thinning and lightening of the skin if they are used for too long in the same area. Your physician has selected the right strength medicine for your problem and area affected on the body. Please use your medication only as directed by your physician to prevent side effects.   Continue ketoconazole 2% up to apply three times per week, massage into scalp and leave in for 10 minutes before rinsing out as needed.   Continue Zilixi increasing to twice daily.  Start Rhofade to face in morning followed by Rhofade.  Due to recent changes in healthcare laws, you may see results of your pathology and/or laboratory studies on MyChart before the doctors have had a chance to review them. We understand that in some cases there may be results that are confusing or concerning to you. Please understand that not all results are received at the same time and often the doctors may need to interpret multiple results in order to provide you with the best plan of care or course of treatment. Therefore, we ask that you please give [place] 2 business days to thoroughly review all your results before contacting the office for clarification. Should we see a critical lab result, you will be contacted sooner.   If You Need Anything After Your Visit  If you have any questions or concerns for your doctor, please call our main line at [phone] and press option 4 to reach your doctor's medical assistant. If no one answers,  please leave a voicemail as directed and we will return your call as soon as possible. Messages left after 4 pm will be answered the following business day.   You may also send [place] a message via [place]. We typically respond to MyChart messages within 1-2 business days.  For prescription refills, please ask your pharmacy to contact our office. Our fax number is [phone].  If you have an urgent issue when the clinic is closed that cannot wait until the next business day, you can page your doctor at the number below.    Please note that while we do our best to be available for urgent issues outside of office hours, we are not available 24/7.   If you have an urgent issue and are unable to reach [place], you may choose to seek medical care at your doctor's office, retail clinic, urgent care center, or emergency room.  If you have a medical emergency, please immediately call 911 or go to the emergency department.  Pager Numbers  - Dr. Nehemiah Massed: [phone]  - Dr. Laurence Ferrari: [phone]  - Dr. Nicole Kindred: [phone]  In the event of inclement weather, please call our main line at [phone] for an update on the status of any delays or closures.  Dermatology Medication Tips: Please keep the boxes that topical medications come in in order to help keep track of the instructions about where and how to use these. Pharmacies typically print the medication instructions only on the boxes and not directly on the medication tubes.   If your medication is  too expensive, please contact our office at [phone] option 4 or send [place] a message through [place].   We are unable to tell what your co-pay for medications will be in advance as this is different depending on your insurance coverage. However, we may be able to find a substitute medication at lower cost or fill out paperwork to get insurance to cover a needed medication.   If a prior authorization is required to get your medication covered by your  insurance company, please allow [place] 1-2 business days to complete this process.  Drug prices often vary depending on where the prescription is filled and some pharmacies may offer cheaper prices.  The website www.goodrx.com contains coupons for medications through different pharmacies. The prices here do not account for what the cost may be with help from insurance (it may be cheaper with your insurance), but the website can give you the price if you did not use any insurance.  - You can print the associated coupon and take it with your prescription to the pharmacy.  - You may also stop by our office during regular business hours and pick up a GoodRx coupon card.  - If you need your prescription sent electronically to a different pharmacy, notify our office through [facility] or by phone at [phone] option 4.     Si Usted Necesita Algo Despus de Su Visita  Tambin puede enviarnos un mensaje a travs de Pharmacist, community. Por lo general respondemos a los mensajes de MyChart en el transcurso de 1 a 2 das hbiles.  Para renovar recetas, por favor pida a su farmacia que se ponga en contacto con nuestra oficina. Harland Dingwall de fax es [place] [phone].  Si tiene un asunto urgente cuando la clnica est cerrada y que no puede esperar hasta el siguiente da hbil, puede llamar/localizar a su doctor(a) al nmero que aparece a continuacin.   Por favor, tenga en cuenta que aunque hacemos todo lo posible para estar disponibles para asuntos urgentes fuera del horario de [place], no estamos disponibles las 24 horas del da, los 7 das de la [place].   Si tiene un problema urgente y no puede comunicarse con nosotros, puede optar por buscar atencin mdica  en el consultorio de su doctor(a), en una clnica privada, en un centro de atencin urgente o en una sala de emergencias.  Si tiene Engineering geologist, por favor llame inmediatamente al 911 o vaya a la sala de emergencias.  Nmeros de  bper  - Dr. Nehemiah Massed: [phone]  - Dra. Moye: [phone]  - Dra. Nicole Kindred: [phone]  En caso de inclemencias del [place], por favor llame a Johnsie Kindred principal al [phone] para una actualizacin sobre el [place] de cualquier retraso o cierre.  Consejos para la medicacin en dermatologa: Por favor, guarde las cajas en las que vienen los medicamentos de uso tpico para ayudarle a seguir las instrucciones sobre dnde y cmo usarlos. Las farmacias generalmente imprimen las instrucciones del medicamento slo en las cajas y no directamente en los tubos del [place].   Si su medicamento es muy caro, por favor, pngase en contacto con Zigmund Daniel llamando al [phone] y presione la opcin 4 o envenos un mensaje a travs de Pharmacist, community.   No podemos decirle cul ser su copago por los medicamentos por adelantado ya que esto es diferente dependiendo de la cobertura de su seguro. Sin embargo, es posible que podamos encontrar un medicamento sustituto a Electrical engineer un formulario para que el  seguro cubra el medicamento que se considera necesario.   Si se requiere una autorizacin previa para que su compaa de seguros [place] su medicamento, por favor permtanos de 1 a 2 das hbiles para completar este proceso.  Los precios de los medicamentos varan con frecuencia dependiendo del Environmental consultant de dnde se surte la receta y alguna farmacias pueden ofrecer precios ms baratos.  El sitio web www.goodrx.com tiene cupones para medicamentos de Airline pilot. Los precios aqu no tienen en cuenta lo que podra costar con la ayuda del seguro (puede ser ms barato con su seguro), pero el sitio web puede darle el precio si no utiliz Research scientist (physical sciences).  - Puede imprimir el cupn correspondiente y llevarlo con su receta a la farmacia.  - Tambin puede pasar por nuestra oficina durante el horario de atencin regular y Charity fundraiser una tarjeta de cupones de GoodRx.  - Si necesita que su receta se  enve electrnicamente a una farmacia diferente, informe a nuestra oficina a travs de MyChart de [place] o por telfono llamando al [phone] y presione la opcin 4.

## 2022-08-23 ENCOUNTER — Encounter: Payer: Self-pay | Admitting: Dermatology

## 2022-08-24 DIAGNOSIS — M2560 Stiffness of unspecified joint, not elsewhere classified: Secondary | ICD-10-CM | POA: Diagnosis not present

## 2022-08-24 DIAGNOSIS — M791 Myalgia, unspecified site: Secondary | ICD-10-CM | POA: Diagnosis not present

## 2022-08-24 DIAGNOSIS — R531 Weakness: Secondary | ICD-10-CM | POA: Diagnosis not present

## 2022-08-24 DIAGNOSIS — M79602 Pain in left arm: Secondary | ICD-10-CM | POA: Diagnosis not present

## 2022-08-29 DIAGNOSIS — Z01419 Encounter for gynecological examination (general) (routine) without abnormal findings: Secondary | ICD-10-CM | POA: Diagnosis not present

## 2022-08-29 DIAGNOSIS — Z6823 Body mass index (BMI) 23.0-23.9, adult: Secondary | ICD-10-CM | POA: Diagnosis not present

## 2022-08-30 DIAGNOSIS — D802 Selective deficiency of immunoglobulin A [IgA]: Secondary | ICD-10-CM | POA: Diagnosis not present

## 2022-08-30 DIAGNOSIS — R55 Syncope and collapse: Secondary | ICD-10-CM | POA: Diagnosis not present

## 2022-08-30 DIAGNOSIS — R21 Rash and other nonspecific skin eruption: Secondary | ICD-10-CM | POA: Diagnosis not present

## 2022-08-30 DIAGNOSIS — R42 Dizziness and giddiness: Secondary | ICD-10-CM | POA: Diagnosis not present

## 2022-09-01 DIAGNOSIS — M2560 Stiffness of unspecified joint, not elsewhere classified: Secondary | ICD-10-CM | POA: Diagnosis not present

## 2022-09-01 DIAGNOSIS — M79602 Pain in left arm: Secondary | ICD-10-CM | POA: Diagnosis not present

## 2022-09-01 DIAGNOSIS — M791 Myalgia, unspecified site: Secondary | ICD-10-CM | POA: Diagnosis not present

## 2022-09-01 DIAGNOSIS — R531 Weakness: Secondary | ICD-10-CM | POA: Diagnosis not present

## 2022-09-07 ENCOUNTER — Telehealth: Payer: Self-pay

## 2022-09-07 ENCOUNTER — Encounter: Payer: Self-pay | Admitting: Dermatology

## 2022-09-07 NOTE — Telephone Encounter (Signed)
This patient stopped all of her allergy medications as directed by an allergist that she was seeing this past week at [facility]. She is now having an itchy rash on her face and says that it is inflamed. She stated that she restarted her antihistamines and that is helping a little bit, but was wondering if there is anything else that she can be prescribed to help?

## 2022-09-08 ENCOUNTER — Other Ambulatory Visit: Payer: Self-pay | Admitting: Dermatology

## 2022-09-08 DIAGNOSIS — M2560 Stiffness of unspecified joint, not elsewhere classified: Secondary | ICD-10-CM | POA: Diagnosis not present

## 2022-09-08 DIAGNOSIS — R531 Weakness: Secondary | ICD-10-CM | POA: Diagnosis not present

## 2022-09-08 DIAGNOSIS — M791 Myalgia, unspecified site: Secondary | ICD-10-CM | POA: Diagnosis not present

## 2022-09-08 DIAGNOSIS — M79602 Pain in left arm: Secondary | ICD-10-CM | POA: Diagnosis not present

## 2022-09-08 MED ORDER — PIMECROLIMUS 1 % EX CREA: TOPICAL_CREAM | Freq: Two times a day (BID) | CUTANEOUS | 2 refills | Status: DC

## 2022-09-11 DIAGNOSIS — R300 Dysuria: Secondary | ICD-10-CM | POA: Diagnosis not present

## 2022-09-11 DIAGNOSIS — H3589 Other specified retinal disorders: Secondary | ICD-10-CM | POA: Diagnosis not present

## 2022-09-13 NOTE — Telephone Encounter (Signed)
I discussed with patient by MyChart message.

## 2022-09-28 ENCOUNTER — Ambulatory Visit: Payer: BC Managed Care – PPO | Admitting: Family Medicine

## 2022-09-28 VITALS — BP 122/84 | HR 96 | Temp 98.5°F | Ht 67.0 in | Wt 150.6 lb

## 2022-09-28 DIAGNOSIS — J45909 Unspecified asthma, uncomplicated: Secondary | ICD-10-CM | POA: Diagnosis not present

## 2022-09-28 DIAGNOSIS — J9801 Acute bronchospasm: Secondary | ICD-10-CM | POA: Insufficient documentation

## 2022-09-28 DIAGNOSIS — R42 Dizziness and giddiness: Secondary | ICD-10-CM | POA: Diagnosis not present

## 2022-09-28 DIAGNOSIS — R5382 Chronic fatigue, unspecified: Secondary | ICD-10-CM | POA: Diagnosis not present

## 2022-09-28 MED ORDER — ALBUTEROL SULFATE HFA 108 (90 BASE) MCG/ACT IN AERS: 2.0000 | INHALATION_SPRAY | Freq: Four times a day (QID) | RESPIRATORY_TRACT | 2 refills | Status: DC | PRN

## 2022-09-28 MED ORDER — PREDNISONE 20 MG PO TABS: ORAL_TABLET | ORAL | 0 refills | Status: DC

## 2022-09-28 NOTE — Assessment & Plan Note (Signed)
Acute following COVID Also likely triggered by being off allergy medicine during allergy testing.

## 2022-09-28 NOTE — Progress Notes (Signed)
Patient ID: DONDRA RHETT, female    DOB: 1991-05-30, 31 y.o.   MRN: [id number]  This visit was conducted in person.  BP 122/84   Pulse 96   Temp 98.5 F (36.9 C)   Ht '5\' 7"'$  (1.702 m)   Wt 150 lb 9.6 oz (68.3 kg)   LMP 09/25/2022 (Exact Date)   SpO2 98%   BMI 23.59 kg/m    CC:  Chief Complaint  Patient presents with   Shortness of Breath    Dizzy,overheating, lower back pain, puffy eyes  tested negative for covid to days ago, had pos covid on 9/19, started new [facility] 9/30, feet tremors     Subjective:   HPI: KORDELIA SEVERIN is a 31 y.o. female patient of Dr. [name] with history of chronic fatigue, chronic constipation, paroxysmal tachycardia and vitamin deficiency presenting on 09/28/2022 for Shortness of Breath (Dizzy,overheating, lower back pain, puffy eyes  tested negative for covid to days ago, had pos covid on 9/19, started new [facility] 9/30, feet tremors )    Recently positive for COVID on September 19 Started new birth control on September 30    Viewed recent office visits from allergist and rheumatologist Noted she is dealing with chronic dizziness.  She has been evaluated in the past by cardiology as well.  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 Tested negative for celiac panel, CBC without anemia. Regarding high copper levels - rec f/u with GI (appt Oct), avoid copper rich goods, continue zinc supplementation, consider future toxicology eval if persistent elevation. Advised stop iron and b12.    She states she has had new shortness of breath... trigger by exertion in last 2 days. 2 week ago she had COVID... Was having a lot of congestion, fatigue, cough, no fever.  Cough has resolved for the most part... some  dry cough triggered by perfume No wheeze.  Chest feel tight  Hx of  sinus and lung infecitons.  She has been off allergy medicines in last week and 1/2.. ends today.  Had some SE as well to new OCP.. made the change given break through breathing. Having nausea and  irritable. Having nml BMs    Relevant past medical, surgical, family and social history reviewed and updated as indicated. Interim medical history since our last visit reviewed. Allergies and medications reviewed and updated. Outpatient Medications Prior to Visit  Medication Sig Dispense Refill   cetirizine (ZYRTEC) 10 MG tablet Take 10 mg by mouth daily as needed for allergies.     Cholecalciferol (VITAMIN D3) 25 MCG (1000 UT) CAPS Take 1 capsule by mouth daily.     diphenhydrAMINE (BENADRYL) 25 mg capsule Take 50 mg by mouth every 6 (six) hours as needed for allergies or itching.     LORazepam (ATIVAN) 0.5 MG tablet Take 0.5-1 tablets (0.25-0.5 mg total) by mouth at bedtime as needed for anxiety or sleep. 20 tablet 0   Multiple Vitamin (MULTIVITAMIN ADULT) TABS Take 1 tablet by mouth daily. Nature Made MVI for Her     Oxymetazoline HCl (RHOFADE) 1 % CREA Apply thin layer to face in am. 30 g 2   polyethylene glycol (MIRALAX / GLYCOLAX) packet Take 17 g by  mouth daily. As needed     triamcinolone (NASACORT) 55 MCG/ACT AERO nasal inhaler Place 2 sprays into the nose daily. 1 Inhaler    hydrocortisone 2.5 % ointment Apply BID to lips up to 2 weeks PRN (Patient not taking: Reported on 09/28/2022) 30 g 1   ketoconazole (NIZORAL) 2 % shampoo apply three times per week, massage into scalp and leave in for 10 minutes before rinsing out (Patient not taking: Reported on 09/28/2022) 120 mL 2   mometasone (ELOCON) 0.1 % cream Apply once to twice daily to itchy rash on chest, shoulders, back until improved. Avoid face, groin, underarms. (Patient not taking: Reported on 09/28/2022) 45 g 1   Norethindrone Acetate-Ethinyl Estrad-FE (BLISOVI 24  FE) 1-20 MG-MCG(24) tablet Take 1 tablet by mouth daily. (Patient not taking: Reported on 09/28/2022)     pimecrolimus (ELIDEL) 1 % cream Apply topically 2 (two) times daily. For rash anywhere on the body. (Patient not taking: Reported on 09/28/2022) 30 g 2   Ruxolitinib Phosphate (OPZELURA) 1.5 % CREA Apply to affected skin once a day (Patient not taking: Reported on 09/28/2022) 60 g 2   tacrolimus (PROTOPIC) 0.1 % ointment Apply to lips twice daily until improved. (Patient not taking: Reported on 09/28/2022) 30 g 1   No facility-administered medications prior to visit.     Per HPI unless specifically indicated in ROS section below Review of Systems  Constitutional:  Negative for fatigue and fever.  HENT:  Negative for congestion.   Eyes:  Negative for pain.  Respiratory:  Negative for cough and shortness of breath.   Cardiovascular:  Negative for chest pain, palpitations and leg swelling.  Gastrointestinal:  Negative for abdominal pain.  Genitourinary:  Negative for dysuria and vaginal bleeding.  Musculoskeletal:  Negative for back pain.  Neurological:  Negative for syncope, light-headedness and headaches.  Psychiatric/Behavioral:  Negative for dysphoric mood.    Objective:  BP 122/84   Pulse 96   Temp 98.5 F (36.9 C)   Ht '5\' 7"'$  (1.702 m)   Wt 150 lb 9.6 oz (68.3 kg)   LMP 09/25/2022 (Exact Date)   SpO2 98%   BMI 23.59 kg/m   Wt Readings from Last 3 Encounters:  09/28/22 150 lb 9.6 oz (68.3 kg)  08/11/22 151 lb 8 oz (68.7 kg)  07/25/22 151 lb 6.4 oz (68.7 kg)      Physical Exam Constitutional:      General: She is not in acute distress.    Appearance: Normal appearance. She is well-developed. She is not ill-appearing or toxic-appearing.  HENT:     Head: Normocephalic.     Right Ear: Hearing, tympanic membrane, ear canal and external ear normal. Tympanic membrane is not erythematous, retracted or bulging.     Left Ear: Hearing, tympanic membrane, ear canal and external ear  normal. Tympanic membrane is not erythematous, retracted or bulging.     Nose: No mucosal edema or rhinorrhea.     Right Sinus: No maxillary sinus tenderness or frontal sinus tenderness.     Left Sinus: No maxillary sinus tenderness or frontal sinus tenderness.     Mouth/Throat:     Pharynx: Uvula midline.  Eyes:     General: Lids are normal. Lids are everted, no foreign bodies appreciated.     Conjunctiva/sclera: Conjunctivae normal.     Pupils: Pupils are equal, round, and reactive to light.  Neck:     Thyroid: No thyroid mass or thyromegaly.     Vascular: No carotid  bruit.     Trachea: Trachea normal.  Cardiovascular:     Rate and Rhythm: Normal rate and regular rhythm.     Pulses: Normal pulses.     Heart sounds: Normal heart sounds, S1 normal and S2 normal. No murmur heard.    No friction rub. No gallop.  Pulmonary:     Effort: Pulmonary effort is normal. No tachypnea or respiratory distress.     Breath sounds: Normal breath sounds. No decreased breath sounds, wheezing, rhonchi or rales.  Abdominal:     General: Bowel sounds are normal.     Palpations: Abdomen is soft.     Tenderness: There is no abdominal tenderness.  Musculoskeletal:     Cervical back: Normal range of motion and neck supple.  Skin:    General: Skin is warm and dry.     Findings: No rash.  Neurological:     Mental Status: She is alert.  Psychiatric:        Mood and Affect: Mood is not anxious or depressed.        Speech: Speech normal.        Behavior: Behavior normal. Behavior is cooperative.        Thought Content: Thought content normal.        Judgment: Judgment normal.       Results for orders placed or performed in visit on 06/23/22  Zinc  Result Value Ref Range   Zinc 88 44 - 115 ug/dL  VITAMIN D 25 Hydroxy (Vit-D Deficiency, Fractures)  Result Value Ref Range   Vit D, 25-Hydroxy 43.1 30.0 - 100.0 ng/mL  Iron, TIBC and Ferritin Panel  Result Value Ref Range   Total Iron Binding Capacity  473 (H) 250 - 450 ug/dL   UIBC 373 131 - 425 ug/dL   Iron 100 27 - 159 ug/dL   Iron Saturation 21 15 - 55 %   Ferritin 26 15 - 150 ng/mL  Vitamin B12  Result Value Ref Range   Vitamin B-12 1,501 (H) 232 - 1,245 pg/mL  Lipid panel  Result Value Ref Range   Cholesterol, Total 189 100 - 199 mg/dL   Triglycerides 331 (H) 0 - 149 mg/dL   HDL 51 >39 mg/dL   VLDL Cholesterol Cal 54 (H) 5 - 40 mg/dL   LDL Chol Calc (NIH) 84 0 - 99 mg/dL   Chol/HDL Ratio 3.7 0.0 - 4.4 ratio  Copper, Serum  Result Value Ref Range   Copper 228 (H) 80 - 158 ug/dL     COVID 19 screen:  No recent travel or known exposure to COVID19 The patient denies respiratory symptoms of COVID 19 at this time. The importance of social distancing was discussed today.   Assessment and Plan    Problem List Items Addressed This Visit     Mild reactive airways disease    Acute following COVID Also likely triggered by being off allergy medicine during allergy testing.      Post-infection bronchospasm - Primary    Her symptoms of shortness of breath are new in the 2 weeks since COVID.  There is no current sign of bacterial superinfection and her initial COVID symptoms have improved.  At this point I think her shortness of breath is more of a reactive airway situation following irritation from the COVID infection.  I will treat with prednisone taper over 6 days and she will use albuterol as needed.  Return and ER precautions were given.  Meds ordered this encounter  Medications   predniSONE (DELTASONE) 20 MG tablet    Sig: 3 tabs by mouth daily x 3 days, then 2 tabs by mouth daily x 2 days then 1 tab by mouth daily x 2 days    Dispense:  15 tablet    Refill:  0   albuterol (VENTOLIN HFA) 108 (90 Base) MCG/ACT inhaler    Sig: Inhale 2 puffs into the lungs every 6 (six) hours as needed for wheezing or shortness of breath.    Dispense:  8 g    Refill:  2       Eliezer Lofts, MD

## 2022-09-28 NOTE — Assessment & Plan Note (Signed)
Her symptoms of shortness of breath are new in the 2 weeks since COVID.  There is no current sign of bacterial superinfection and her initial COVID symptoms have improved.  At this point I think her shortness of breath is more of a reactive airway situation following irritation from the COVID infection.  I will treat with prednisone taper over 6 days and she will use albuterol as needed.  Return and ER precautions were given.

## 2022-09-28 NOTE — Patient Instructions (Addendum)
Start back zyrtec, Claritin and benadryl as needed. Complete prednisone taper.  Can use albuterol as needed. If severe shortness of breath, worsening.Marland Kitchen go to ER.  If SE from the OCP are not improving.. contact GYN.

## 2022-09-29 ENCOUNTER — Ambulatory Visit: Payer: BC Managed Care – PPO | Admitting: Family Medicine

## 2022-10-03 ENCOUNTER — Ambulatory Visit: Payer: BC Managed Care – PPO | Admitting: Internal Medicine

## 2022-10-03 ENCOUNTER — Encounter: Payer: Self-pay | Admitting: Internal Medicine

## 2022-10-03 VITALS — BP 104/72 | HR 82 | Ht 67.0 in | Wt 151.5 lb

## 2022-10-03 DIAGNOSIS — R194 Change in bowel habit: Secondary | ICD-10-CM | POA: Diagnosis not present

## 2022-10-03 DIAGNOSIS — K599 Functional intestinal disorder, unspecified: Secondary | ICD-10-CM

## 2022-10-03 DIAGNOSIS — Z800 Family history of malignant neoplasm of digestive organs: Secondary | ICD-10-CM

## 2022-10-03 DIAGNOSIS — R790 Abnormal level of blood mineral: Secondary | ICD-10-CM

## 2022-10-03 NOTE — Patient Instructions (Signed)
After Dr [name] reviews everything we will reach back out to you with plans.   _______________________________________________________  If you are age 31 or older, your body mass index should be between 23-30. Your Body mass index is 23.73 kg/m. If this is out of the aforementioned range listed, please consider follow up with your Primary Care Provider.  If you are age 33 or younger, your body mass index should be between 19-25. Your Body mass index is 23.73 kg/m. If this is out of the aformentioned range listed, please consider follow up with your Primary Care Provider.   ________________________________________________________  The [facility] providers would like to encourage you to use [facility] to communicate with providers for non-urgent requests or questions.  Due to long hold times on the telephone, sending your provider a message by [facility] may be a faster and more efficient way to get a response.  Please allow 48 business hours for a response.  Please remember that this is for non-urgent requests.  _______________________________________________________  Due to recent changes in healthcare laws, you may see the results of your imaging and laboratory studies on MyChart before your provider has had a chance to review them.  We understand that in some cases there may be results that are confusing or concerning to you. Not all laboratory results come back in the same time frame and the provider may be waiting for multiple results in order to interpret others.  Please give [place] 48 hours in order for your provider to thoroughly review all the results before contacting the office for clarification of your results.   I appreciate the opportunity to care for you. Silvano Rusk, MD, [facility]

## 2022-10-03 NOTE — Progress Notes (Addendum)
Caroline [name] 31 y.o. [birth year] [id number]  Assessment & Plan:   Encounter Diagnoses  Name Primary?   Colonic inertia Yes   Change in bowel habits    Family history of colon cancer in father    Abnormal blood level of copper    I do not understand why her serum copper level is elevated. She has had a work-up that excludes Wilson's disease and I do not think there are other inborn errors of metabolism, either. I did search to see if there are other causes that she might have but could not identify any that made sense to me.  Here is the list I came up with but she does not have these problems to the best of my knowledge.  Anemia Biliary cirrhosis, a liver disease Hemochromatosis, a condition in which your body absorbs too much iron Overactive thyroid (hyperthyroidism) Underactive thyroid (hypothyroidism) Infection Leukemia Lymphoma Rheumatoid arthritis   I suppose she could try a university center or perhaps even a place like [facility] for further help. This is beyond what I can offer.   As far as bowel habit issues - I suspect this a manifestation of her colonic inertia. Will discuss trying a different agent like Motegrity. Will also discuss possible colonoscopy (has Fhx CRCA).  I have called her and left a message that I will send a My Chart message.  JG:[id number], Garlon Hatchet, MD    She did have a slightly low B12 level at one point so given that and her colonic inertia should consider SIBO testing - though I think that would cause low copper levels and thus would not be a unifying diagnosis. Subjective:   Chief Complaint: Elevated copper levels and change in bowel habits  HPI 31 year old white woman with a history of colonic inertia diagnosed at [facility], last seen by me July 12, 2021 and she was doing well.  Previously seen by Dr. Olevia Perches in this practice.  She also has a history of chronic fatigue, disequilibrium iron deficiency and allergic rhinitis.  When I saw her  last year she had a reported that she was managing her colonic inertia well with MiraLAX.  In the interim she has been having problems with cramps and bloating and irregular bowel habits and sometimes urgent defecation which requires her to run to the bathroom and she has had some incontinence.  She is working from home so this is tolerated but her quality of life is poor and not acceptable with this.  She reports having had Linzess treatment in the past but it was not helpful.  Bowel habit pattern these days is cramps pain without defecation and then 1 day during the week and cycle she will have multiple loose stools she will be okay for a few days and then the cycle repeats.  It may be worse during menses.  She has an ovarian cyst and some breakthrough bleeding so recently had her oral contraceptive dose increased.  There is no rectal bleeding.  She uses MiraLAX 2-3 times a week as taking it daily seems to cause too much nausea and diarrhea.  In 2020 she had some sort of insect bite and has developed heart rate variability palpitations and presyncopal episodes and there has been a question of POTS.  In October 2022 due to some neuropathic symptoms she had B1 and copper levels checked and her copper returned at 245.  There was no known excess copper ingestion and no KF rings seen on eye exam and she  did eventually see an ophthalmologist that she apparently has some sort of "spots" but no report of KF rings.  A ceruloplasmin was high at 52.6 and her urinary copper excretion was normal on a 24-hour study in November 2022.  Zinc supplementation was started in an effort to reduce copper levels.  The levels of copper in her water were negative.  There is no family history of copper toxicity syndromes.  A recheck of copper level showed it still to be high at 228 in July 2023.  Zinc level was normal.  88.     She saw oncology 07/19/2022 regarding the elevated copper levels.  Etiology was not clear and they  recommended she return to GI.  She saw allergy of [facility] August 30, 2022 for evaluation of a slightly low IgA level.  She has previously been tested for celiac disease and is negative and alpha gal test was negative as well.  Immunoglobulins all normal except IgE was low.  Serum tryptase normal.  Leukotriene E4 24-hour urine normal.  ANA negative.  Allergies  Allergen Reactions   Amoxicillin Other (See Comments)    Does not tolerate larger doses of amoxicillin, tolerates '500mg'$  TID well   Doxycycline Nausea And Vomiting   Linzess [Linaclotide] Other (See Comments)    Bloating, abd pain   Neomycin     Tested weakly positive by patch test   Other Rash    Oxycide - cleaner used at hospital. Caused rash all over.    Current Meds  Medication Sig   cetirizine (ZYRTEC) 10 MG tablet Take 10 mg by mouth daily as needed for allergies.   Cholecalciferol (VITAMIN D3) 25 MCG (1000 UT) CAPS Take 1 capsule by mouth daily.   diphenhydrAMINE (BENADRYL) 25 mg capsule Take 50 mg by mouth every 6 (six) hours as needed for allergies or itching.   ketoconazole (NIZORAL) 2 % shampoo apply three times per week, massage into scalp and leave in for 10 minutes before rinsing out (Patient taking differently: as needed. apply three times per week, massage into scalp and leave in for 10 minutes before rinsing out)   LORazepam (ATIVAN) 0.5 MG tablet Take 0.5-1 tablets (0.25-0.5 mg total) by mouth at bedtime as needed for anxiety or sleep.   mometasone (ELOCON) 0.1 % cream Apply once to twice daily to itchy rash on chest, shoulders, back until improved. Avoid face, groin, underarms. (Patient taking differently: as needed. Apply once to twice daily to itchy rash on chest, shoulders, back until improved. Avoid face, groin, underarms.)   Multiple Vitamin (MULTIVITAMIN ADULT) TABS Take 1 tablet by mouth daily. Nature Made MVI for Her   norgestimate-ethinyl estradiol (MILI) 0.25-35 MG-MCG tablet Take 1 tablet by  mouth daily. .'30mg'$    polyethylene glycol (MIRALAX / GLYCOLAX) packet Take 17 g by mouth daily. As needed   predniSONE (DELTASONE) 20 MG tablet 3 tabs by mouth daily x 3 days, then 2 tabs by mouth daily x 2 days then 1 tab by mouth daily x 2 days   Ruxolitinib Phosphate (OPZELURA) 1.5 % CREA Apply to affected skin once a day   triamcinolone (NASACORT) 55 MCG/ACT AERO nasal inhaler Place 2 sprays into the nose daily.   Past Medical History:  Diagnosis Date   Colonic inertia 2007   Dr [name] (GI [facility])   COVID-19    Dysplastic nevus 11/19/2019   Left upper abdomen. Mild atypia.   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   Edu: Scientist, 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   Activity: Walking   Diet: good water, fruits/vegetables daily      family history includes Atrial fibrillation in her father; COPD in her father; Cancer in her father and paternal grandmother; Cirrhosis in her father; Colon cancer in her father and paternal aunt; Dementia in her maternal grandmother; Diabetes in her father; Heart failure in her maternal grandmother; Hypertension in her father, maternal grandmother, mother, and paternal grandmother; Hypothyroidism in her mother; Liver disease in her father; Stroke in her maternal grandmother.   Review of Systems As per HPI  Objective:   Physical Exam '@BP'$  104/72   Pulse 82   Ht '5\' 7"'$  (1.702 m)   Wt 151 lb 8 oz (68.7 kg)   LMP 09/25/2022 (Exact Date)   BMI 23.73 kg/m  @  General:  NAD Eyes:   anicteric Lungs:  clear Heart::  S1S2 no rubs, murmurs or gallops Abdomen:  soft and nontender, BS+ Ext:   no edema, cyanosis or clubbing    Data Reviewed:  As per HPI   Total time > 40 mins

## 2022-10-10 ENCOUNTER — Encounter: Payer: Self-pay | Admitting: Internal Medicine

## 2022-10-10 ENCOUNTER — Other Ambulatory Visit: Payer: Self-pay | Admitting: Internal Medicine

## 2022-10-12 ENCOUNTER — Other Ambulatory Visit: Payer: Self-pay | Admitting: Internal Medicine

## 2022-10-12 DIAGNOSIS — R42 Dizziness and giddiness: Secondary | ICD-10-CM | POA: Diagnosis not present

## 2022-10-12 DIAGNOSIS — M2550 Pain in unspecified joint: Secondary | ICD-10-CM | POA: Diagnosis not present

## 2022-10-12 DIAGNOSIS — R253 Fasciculation: Secondary | ICD-10-CM | POA: Diagnosis not present

## 2022-10-12 DIAGNOSIS — R790 Abnormal level of blood mineral: Secondary | ICD-10-CM | POA: Diagnosis not present

## 2022-10-12 MED ORDER — MOTEGRITY 2 MG PO TABS: 2.0000 mg | ORAL_TABLET | Freq: Every day | ORAL | 2 refills | Status: DC

## 2022-10-13 ENCOUNTER — Telehealth: Payer: Self-pay | Admitting: Cardiovascular Disease

## 2022-10-13 ENCOUNTER — Telehealth: Payer: Self-pay

## 2022-10-13 MED ORDER — METOPROLOL TARTRATE 25 MG PO TABS: ORAL_TABLET | ORAL | 1 refills | Status: DC

## 2022-10-13 NOTE — Telephone Encounter (Signed)
-----   Message from Gatha Mayer, MD sent at 10/12/2022  4:11 PM EDT ----- Regarding: f/u appointment Please contact via My Chart to make a 2 month f/u w/ me  thanks

## 2022-10-13 NOTE — Telephone Encounter (Signed)
Reviewed the patient's message with Dr. Rockey Situ prior to calling her. Per Dr. Rockey Situ, in the context of her recent COVID illness and history of tachycardia, the patient will need to let the virus run it's course, which can take several weeks.  He recommends the patient hydrate. She could also use a low dose beta blocker- Metoprolol tartrate 12.5 mg BID as needed for tachycardia.  I called the patient. She advised that she tested + for COVID on 09/12/22. She tested negative a week later.  However, she was having issues with her lungs and was started on prednisone.   She advised that after she finished her prednisone, she developed tachycardia.  She thought this was related to anxiety and has been taking Ativan with no improvement in symptoms.  I have advised the patient of Dr. Donivan Scull recommendations as above to: 1) Hydrate 2) Take metoprolol tartrate 12.5 mg BID PRN tachycardia  The patient voices understanding but was concerned about her low BP. I advised that Dr. Rockey Situ had mentioned she runs borderline low, but with her age she should be able to tolerate  low dose beta blocker.  I have also advised her that her body is most likely recovering from her recent viral illness and prednisone. She should hydrate with gatorade/ something with electrolytes as well.  The patient voices understanding and is agreeable.  She inquired if she should keep her follow up appt in our office for Tuesday 10/17/22. I advised her that we can keep this for now and if on Monday she is feeling better, she has the option to cancel. The patient is agreeable.

## 2022-10-13 NOTE — Telephone Encounter (Signed)
Office Visit scheduled for 12/12/2022 at 9:50 My chart message sent to pt

## 2022-10-13 NOTE — Telephone Encounter (Signed)
STAT if HR is under 50 or over 120 (normal HR is 60-100 beats per minute)  What is your heart rate? [place] down  Do you have a log of your heart rate readings (document readings)?  69-122 today  85-123 10/19 82-131 10/18  79-128 10/17 77-132 10/16 74-122 10/15  Do you have any other symptoms? After having covid last week she says she is having tremors in legs and arms and pulsing and having issues with walking and having pre-syncope where she feels like she is going to pass out but does not.

## 2022-10-15 NOTE — Progress Notes (Unsigned)
Cardiology Clinic Note   Patient Name: Caroline [name] Date of Encounter: 10/17/2022  Primary Care Provider:  [name], MD Primary Cardiologist:  None  Patient Profile    Caroline [name] is a 31 year-old female who presents to the clinic today for complaints of tachycardia.  Past Medical History    Past Medical History:  Diagnosis Date   Colonic inertia 2007   Dr [name] (GI [facility])   COVID-19    Dysplastic nevus 11/19/2019   Left upper abdomen. Mild atypia.   History of chicken pox    Influenza B 11/19/2018   Seasonal allergic rhinitis    Past Surgical History:  Procedure Laterality Date   COLONOSCOPY  2010   WNL [name])   EXCISION MORTON'S NEUROMA Left 2007   WISDOM TOOTH EXTRACTION  2010    Allergies  Allergies  Allergen Reactions   Amoxicillin Other (See Comments)    Does not tolerate larger doses of amoxicillin, tolerates '500mg'$  TID well   Doxycycline Nausea And Vomiting   Linzess [Linaclotide] Other (See Comments)    Bloating, abd pain   Neomycin     Tested weakly positive by patch test   Other Rash    Oxycide - cleaner used at hospital. Caused rash all over.     History of Present Illness    Caroline [name] has a past medical history of: Paroxysmal tachycardia. 14-day Zio patch December 2020: Dominant rhythm was sinus with an average heart rate of 86 bpm.  Slowest heart rate while sleeping.  Fastest heart rate was sinus tach at 173 bpm while patient was working. Echo 12/03/2019: 60 to 65%.  Mitral valve regurgitation without stenosis.  Trivial tricuspid valve regurgitation.  Pulmonic valve regurgitation.  Was last seen in the office on 07/20/2021 by Dr. Rockey Situ.  At that time she reported she did not like taking metoprolol tartrate secondary to side effects.  She continued to have tachycardia exacerbated by heat and walking longer distances.  She was started on propranolol 10 mg up to 3 times a day as needed.  Most recently patient called in on  10/13/2022 to report tachycardia during rest.  Readings for the last 5 days: 69-122 10/20  85-123 10/19 82-131 10/18  79-128 10/17 77-132 10/16 74-122 10/15 She reported positive COVID test in September 2023 and recent 7-day course of steroids.  Since that time she reported tremors in legs and arms and issues with walking as well as presyncope.  Triage nurse discussed symptoms with Dr. Rockey Situ and it was suggested she hydrate and take metoprolol tartrate 12.5 mg twice a day as needed for tachycardia.  Today, patient reports she is taking metoprolol tartrate 12.5 mg once a day and it seems to be controlling her heart rate better.  She has been taking 1 dose typically in the evenings for the last 4 days.  She feels the increase in her heart rate coincides with contracting COVID and increasing her birth control at the same time.  Post COVID she continued to have "lung issues" so she was started on a 7-day course of prednisone which she completed on 10/05/2022.  Her heart rate throughout the course of prednisone was in the 80s which is typical for her.  The day after completing the prednisone she noticed hives on bilateral soles of her feet.  The rash cleared with antihistamines.  On 10/08/2022 she began noticing a jump in her heart rate (range of readings listed above).  She denies chest pain or  shortness of breath.  She does report feeling similar to a "head rush" that occurs while she is sitting at her desk working.  This is new for her since starting on the increased dose of birth control on 09/23/2022.  She has not passed out.  She evaluated by rheumatology for muscle fasciculations and gait issues on 10/12/2022.  This was not thought to be a rheumatologic problem and she was referred to neurology and GI.  She also has an appointment scheduled with her PCP next week to discuss her reaction to prednisone that included vision changes, the rash as mentioned above, and a feeling of internal tremors.   Home  Medications    Current Meds  Medication Sig   cetirizine (ZYRTEC) 10 MG tablet Take 10 mg by mouth daily as needed for allergies.   diphenhydrAMINE (BENADRYL) 25 mg capsule Take 50 mg by mouth every 6 (six) hours as needed for allergies or itching.   ketoconazole (NIZORAL) 2 % shampoo apply three times per week, massage into scalp and leave in for 10 minutes before rinsing out (Patient taking differently: as needed. apply three times per week, massage into scalp and leave in for 10 minutes before rinsing out)   LORazepam (ATIVAN) 0.5 MG tablet Take 0.5-1 tablets (0.25-0.5 mg total) by mouth at bedtime as needed for anxiety or sleep.   metoprolol tartrate (LOPRESSOR) 25 MG tablet Take 0.5 tablet (12.5 mg) by mouth twice daily as needed for tachycardia   mometasone (ELOCON) 0.1 % cream Apply once to twice daily to itchy rash on chest, shoulders, back until improved. Avoid face, groin, underarms. (Patient taking differently: as needed. Apply once to twice daily to itchy rash on chest, shoulders, back until improved. Avoid face, groin, underarms.)   norgestimate-ethinyl estradiol (MILI) 0.25-35 MG-MCG tablet Take 1 tablet by mouth daily. .'30mg'$    Oxymetazoline HCl (RHOFADE) 1 % CREA Apply thin layer to face in am.   pimecrolimus (ELIDEL) 1 % cream Apply topically 2 (two) times daily. For rash anywhere on the body.   polyethylene glycol (MIRALAX / GLYCOLAX) packet Take 17 g by mouth daily. As needed   Ruxolitinib Phosphate (OPZELURA) 1.5 % CREA Apply to affected skin once a day   triamcinolone (NASACORT) 55 MCG/ACT AERO nasal inhaler Place 2 sprays into the nose daily.    Family History    Family History  Problem Relation Age of Onset   Hypothyroidism Mother    Hypertension Mother    Liver disease Father    Colon cancer Father    Hypertension Father    Cancer Father        pancreas   Cirrhosis Father    COPD Father    Atrial fibrillation Father    Diabetes Father    Hypertension Maternal  Grandmother    Heart failure Maternal Grandmother    Dementia Maternal Grandmother    Stroke Maternal Grandmother    Hypertension Paternal Grandmother    Cancer Paternal Grandmother        rare colon cancer   Colon cancer Paternal Aunt    She indicated that her mother is alive. She indicated that her father is deceased. She indicated that the status of her maternal grandmother is unknown. She indicated that the status of her paternal grandmother is unknown. She indicated that her paternal aunt is deceased.   Social History    Social History   Socioeconomic History   Marital status: Single    Spouse name: Not on file  Number of children: 0   Years of education: 20   Highest education level: Bachelor's degree (e.g., BA, AB, BS)  Occupational History   Occupation: IT  Tobacco Use   Smoking status: Never   Smokeless tobacco: Never  Vaping Use   Vaping Use: Never used  Substance and Sexual Activity   Alcohol use: Yes    Comment: occasionally   Drug use: No   Sexual activity: Yes    Birth control/protection: None  Other Topics Concern   Not on file  Social History Narrative   Caffeine use: 1 cup coffee/day and some tea   Lives at home with mother, father, and sister and pets (horses, ducks, Sales promotion account executive, fish, wild [place])   Occ: Neurosurgeon, works in Engineer, 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  Cardiovascular:  No chest pain, dyspnea on exertion, edema, orthopnea, palpitations,  paroxysmal nocturnal dyspnea. Dermatological: No rash, lesions/masses Respiratory: No cough, dyspnea Urologic: No hematuria, dysuria Abdominal:   No nausea, vomiting, diarrhea, bright red blood per rectum, melena, or hematemesis Neurologic:  No visual changes, weakness, changes in mental status. All other systems reviewed and are otherwise negative except as noted above.  Physical Exam    VS:  BP 114/84 (BP Location: Left Arm, Patient Position: Sitting, Cuff Size: Normal)   Pulse 91   Ht '5\' 7"'$  (1.702 m)   Wt 152 lb 9.6 oz (69.2 kg)   LMP 09/25/2022 (Exact Date)   SpO2 99%   BMI 23.90 kg/m  , BMI Body mass index is 23.9 kg/m. GEN:  Well nourished, well developed, in no acute distress. HEENT: Normal. Neck: Supple, no JVD, carotid bruits, or masses. Cardiac: RRR, no murmurs, rubs, or gallops. No clubbing, cyanosis, edema.  Radials/DP/PT 2+ and equal bilaterally.  Respiratory:  Respirations regular and unlabored, clear to auscultation bilaterally. GI: Soft, nontender, nondistended. MS: No deformity or atrophy. Skin: Warm and dry, no rash. Neuro:  Strength and sensation are intact. Psych: Normal affect.  Accessory Clinical Findings    The following studies were reviewed for this visit: 10-day ZIO December 2020: Study highlights: The patient was monitored with a Zio patch for 14 days from December 05, 2019 through December 19, 2019.  The predominant rhythm was sinus rhythm with an average heart rate at 86 bpm.  The slowest heart rate was sinus bradycardia which occurred at 4:52 AM while she was sleeping on December 23.  The fastest heart rate was sinus tachycardia at 173 bpm when the patient was at work at 9:27 AM on December 18.  The patient had 1 run of nonsustained ventricular tachycardia lasting 6 beats with an average rate at 113 bpm and maximum 135.  There were very rare isolated PACs less than 1% and very rare atrial couplets less than 1%.  There were very rare isolated PVCs and  couplets less than 1%.  There were no pauses.  There was no evidence for atrial fibrillation.  Echo 12/03/2019: IMPRESSIONS    1. Left ventricular ejection fraction, by visual estimation, is 60 to  65%. The left ventricle has normal function. There is no left ventricular hypertrophy.   2. The left ventricle has no regional wall motion abnormalities.   3. Global right ventricle has normal systolic function.The right  ventricular size is normal. No increase in right ventricular wall  thickness.   4. Left atrial size was normal.   5. Right atrial size was normal.   6. The mitral valve is normal in structure. Trivial mitral valve  regurgitation. No evidence of mitral stenosis.   7. The tricuspid valve is normal in structure. Tricuspid valve  regurgitation is trivial.   8. The aortic valve is tricuspid. Aortic valve regurgitation is not  visualized. No evidence of aortic valve sclerosis or stenosis.   9. The pulmonic valve was normal in structure. Pulmonic valve  regurgitation is trivial.  10. Normal pulmonary artery systolic pressure.  11. The inferior vena cava is normal in size with greater than 50%  respiratory variability, suggesting right atrial pressure of 3 mmHg.    Recent Labs: 12/30/2021: TSH 2.570   Recent Lipid Panel    Component Value Date/Time   CHOL 189 06/29/2022 0816   TRIG 331 (H) 06/29/2022 0816   HDL 51 06/29/2022 0816   CHOLHDL 3.7 06/29/2022 0816   LDLCALC 84 06/29/2022 0816       ECG personally reviewed by me today sinus rhythm, heart rate 91. Unchanged from 07/20/2021.   Assessment & Plan   Paroxysmal tachycardia.  Resting heart rate normally in the 80s.  Beginning 10/08/2022, following COVID on 09/12/2022 and a 7-day course of prednisone starting on 09/29/2022, patient noted tachycardia to the 130s while at rest.  She was advised to increase hydration and take metoprolol tartrate 12.5 mg twice a day.  For the last 4 days she has taken the metoprolol once a  day in the evenings and feels her heart rate is better controlled.  Offered metoprolol succinate 25 mg once a day but she feels she did not do well with that medication in the past.  She is advised to begin taking the metoprolol 12.5 mg twice a day for better control of her heart rate.  Discussed potentially repeating 14-day ZIO to evaluate tachycardia further.  She defers at this time opting for the twice a day dosing of metoprolol.  She would also like to see if she can get some of the acute issues following her prednisone reaction under control before pursuing a heart monitor.   Disposition: Return in 3 months to Dr. Rockey Situ to evaluate response to metoprolol and consider cardiac event monitor if tachycardia is persistent.   Justice [place]. Chrisann Melaragno, NP-C     10/17/2022, 3:04 PM [place] [address] Office [phone] Fax [phone]   I spent 15 minutes examining this patient, reviewing medications, and using patient centered shared decision making involving her cardiac care.  Prior to her visit I spent greater than 20 minutes reviewing her past medical history,  medications, and prior cardiac tests.

## 2022-10-17 ENCOUNTER — Other Ambulatory Visit (HOSPITAL_COMMUNITY): Payer: Self-pay

## 2022-10-17 ENCOUNTER — Encounter: Payer: Self-pay | Admitting: Cardiology

## 2022-10-17 ENCOUNTER — Ambulatory Visit: Payer: BC Managed Care – PPO | Attending: Cardiology | Admitting: Student

## 2022-10-17 ENCOUNTER — Telehealth: Payer: Self-pay

## 2022-10-17 VITALS — BP 114/84 | HR 91 | Ht 67.0 in | Wt 152.6 lb

## 2022-10-17 DIAGNOSIS — I479 Paroxysmal tachycardia, unspecified: Secondary | ICD-10-CM | POA: Diagnosis not present

## 2022-10-17 NOTE — Telephone Encounter (Signed)
PA request for Motegrity received through [facility] from [place].  PA has been submitted and we are awaiting determination.  Patient has failed Linzess and Mirilax and has been documented in [place] questions.   Key: [id number]

## 2022-10-17 NOTE — Telephone Encounter (Signed)
Received notification from [facility] regarding a prior authorization for [place]. Authorization has been APPROVED from 10/17/2022 to 10/18/2023.   Key: [id number]

## 2022-10-17 NOTE — Patient Instructions (Addendum)
Medication Instructions:  Your physician has recommended you make the following change in your medication:   RESTART Metoprolol 12.5 mg twice daily   *If you need a refill on your cardiac medications before your next appointment, please call your pharmacy*   Lab Work: None  If you have labs (blood work) drawn today and your tests are completely normal, you will receive your results only by: [place] (if you have MyChart) OR A paper copy in the mail If you have any lab test that is abnormal or we need to change your treatment, we will call you to review the results.   Testing/Procedures: None   Follow-Up: At [facility] you and your health needs are our priority.  As part of our continuing mission to provide you with exceptional heart care, we have created designated Provider Care Teams.  These Care Teams include your primary Cardiologist (physician) and Advanced Practice Providers (APPs -  Physician Assistants and Nurse Practitioners) who all work together to provide you with the care you need, when you need it.   Your next appointment:   3 month(s)  The format for your next appointment:   In Person  Provider:   Ida Rogue, MD       Important Information About Sugar

## 2022-10-18 ENCOUNTER — Ambulatory Visit: Payer: BC Managed Care – PPO | Admitting: Family Medicine

## 2022-10-23 ENCOUNTER — Ambulatory Visit: Payer: BC Managed Care – PPO | Admitting: Family Medicine

## 2022-10-23 ENCOUNTER — Encounter: Payer: Self-pay | Admitting: Family Medicine

## 2022-10-23 VITALS — BP 120/80 | HR 78 | Temp 97.7°F | Ht 67.0 in | Wt 153.2 lb

## 2022-10-23 DIAGNOSIS — U071 COVID-19: Secondary | ICD-10-CM | POA: Diagnosis not present

## 2022-10-23 DIAGNOSIS — R21 Rash and other nonspecific skin eruption: Secondary | ICD-10-CM

## 2022-10-23 DIAGNOSIS — R5382 Chronic fatigue, unspecified: Secondary | ICD-10-CM

## 2022-10-23 DIAGNOSIS — M546 Pain in thoracic spine: Secondary | ICD-10-CM

## 2022-10-23 DIAGNOSIS — R7879 Finding of abnormal level of heavy metals in blood: Secondary | ICD-10-CM

## 2022-10-23 DIAGNOSIS — G8929 Other chronic pain: Secondary | ICD-10-CM

## 2022-10-23 DIAGNOSIS — K5909 Other constipation: Secondary | ICD-10-CM

## 2022-10-23 DIAGNOSIS — E538 Deficiency of other specified B group vitamins: Secondary | ICD-10-CM | POA: Diagnosis not present

## 2022-10-23 DIAGNOSIS — L509 Urticaria, unspecified: Secondary | ICD-10-CM

## 2022-10-23 DIAGNOSIS — K599 Functional intestinal disorder, unspecified: Secondary | ICD-10-CM

## 2022-10-23 DIAGNOSIS — R202 Paresthesia of skin: Secondary | ICD-10-CM

## 2022-10-23 DIAGNOSIS — I479 Paroxysmal tachycardia, unspecified: Secondary | ICD-10-CM

## 2022-10-23 DIAGNOSIS — I951 Orthostatic hypotension: Secondary | ICD-10-CM

## 2022-10-23 NOTE — Patient Instructions (Addendum)
Good to see you today  We will await [place] neurology and gastroenterology evaluations.  Look into integrative therapies (integrativetherapies.net) Ok to take a few B12 tablets per week over the next few weeks to see if improved energy levels. Let us know if helpful.

## 2022-10-23 NOTE — Progress Notes (Unsigned)
Patient ID: Caroline [name], female    DOB: [birth year], 31 y.o.   MRN: [id number]  This visit was conducted in person.  BP 120/80   Pulse 78   Temp 97.7 F (36.5 C) (Temporal)   Ht '5\' 7"'$  (1.702 m)   Wt 153 lb 4 oz (69.5 kg)   LMP 10/15/2022 (Exact Date)   SpO2 98%   BMI 24.00 kg/m    CC: check up post -COVID Subjective:   HPI: Caroline [name] is a 31 y.o. female presenting on 10/23/2022 for Spasms (C/o muscle twitches, hives, elevated HR, falls and pre-syncope post COVID. )   Tested positive for COVID 09/12/2022. She did not take antiviral. Due to ongoing symptoms post-COVID, treated with prednisone course with subsequent deterioration in symptoms including rash to soles/hives that cleared with antihistamine, worsening tachycardia (see below) and presyncope. Muscle twitching started to hands and feet around the time she contracted COVID.   New rash to R index finger with tiny blisters that pop up under skin then skin breaks down and dries up.   Saw rheumatology for diffuse arthralgias - not consistent with autoimmune disease, tested negative for limited vasculitis and sjogren's syndrome.  Saw GI for copper overload - unclear cause of this.  Saw cardiology for paroxysmal tachycardia post-COVID up to 130s - started on metoprolol 12.'5mg'$  BID PRN which has seemed to help regularize heart rate.  Saw immunology 08/2022 - tested negative for alpha gal, mast cell disorder, had normal immunoglobulins (low IgE).   She's had rheumatology, gastroenterology, neurology, allergy/immunology and dermatology evaluations in the community. Now planning to undergo evaluation at tertiary care center (GI and neurology).   She is also planning to see chiropractor.  She has completed 11 months of PT at [facility].   From my prior note 07/2022: Saw hematology for iron deficiency and copper excess - found to have low IgA levels - referred to allergist for evaluation of IgA deficiency. Tested negative for  celiac panel, CBC without anemia. Regarding high copper levels - rec f/u with GI (appt Oct), avoid copper rich goods, continue zinc supplementation, consider future toxicology eval if persistent elevation. Advised stop iron and b12.  Saw dermatology - referred to rheumatologist  Recently completed patch allergy testing - weakly positive responses to nickel, neomycin, epoxy resin, black rubber mix.      Relevant past medical, surgical, family and social history reviewed and updated as indicated. Interim medical history since our last visit reviewed. Allergies and medications reviewed and updated. Outpatient Medications Prior to Visit  Medication Sig Dispense Refill   cetirizine (ZYRTEC) 10 MG tablet Take 10 mg by mouth daily as needed for allergies.     Cholecalciferol (VITAMIN D3) 25 MCG (1000 UT) CAPS Take 1 capsule by mouth daily.     diphenhydrAMINE (BENADRYL) 25 mg capsule Take 50 mg by mouth every 6 (six) hours as needed for allergies or itching.     ketoconazole (NIZORAL) 2 % shampoo apply three times per week, massage into scalp and leave in for 10 minutes before rinsing out (Patient taking differently: as needed. apply three times per week, massage into scalp and leave in for 10 minutes before rinsing out) 120 mL 2   LORazepam (ATIVAN) 0.5 MG tablet Take 0.5-1 tablets (0.25-0.5 mg total) by mouth at bedtime as needed for anxiety or sleep. 20 tablet 0   metoprolol tartrate (LOPRESSOR) 25 MG tablet Take 0.5 tablet (12.5 mg) by mouth twice daily as needed for tachycardia  30 tablet 1   mometasone (ELOCON) 0.1 % cream Apply once to twice daily to itchy rash on chest, shoulders, back until improved. Avoid face, groin, underarms. (Patient taking differently: as needed. Apply once to twice daily to itchy rash on chest, shoulders, back until improved. Avoid face, groin, underarms.) 45 g 1   Multiple Vitamin (MULTIVITAMIN ADULT) TABS Take 1 tablet by mouth daily. Nature Made MVI for Her      norgestimate-ethinyl estradiol (MILI) 0.25-35 MG-MCG tablet Take 1 tablet by mouth daily. .'30mg'$      Oxymetazoline HCl (RHOFADE) 1 % CREA Apply thin layer to face in am. 30 g 2   pimecrolimus (ELIDEL) 1 % cream Apply topically 2 (two) times daily. For rash anywhere on the body. 30 g 2   polyethylene glycol (MIRALAX / GLYCOLAX) packet Take 17 g by mouth daily. As needed     predniSONE (DELTASONE) 20 MG tablet 3 tabs by mouth daily x 3 days, then 2 tabs by mouth daily x 2 days then 1 tab by mouth daily x 2 days 15 tablet 0   Ruxolitinib Phosphate (OPZELURA) 1.5 % CREA Apply to affected skin once a day 60 g 2   triamcinolone (NASACORT) 55 MCG/ACT AERO nasal inhaler Place 2 sprays into the nose daily. 1 Inhaler    Prucalopride Succinate (MOTEGRITY) 2 MG TABS Take 1 tablet (2 mg total) by mouth daily. (Patient not taking: Reported on 10/23/2022) 30 tablet 2   albuterol (VENTOLIN HFA) 108 (90 Base) MCG/ACT inhaler Inhale 2 puffs into the lungs every 6 (six) hours as needed for wheezing or shortness of breath. (Patient not taking: Reported on 10/03/2022) 8 g 2   No facility-administered medications prior to visit.     Per HPI unless specifically indicated in ROS section below Review of Systems  Objective:  BP 120/80   Pulse 78   Temp 97.7 F (36.5 C) (Temporal)   Ht '5\' 7"'$  (1.702 m)   Wt 153 lb 4 oz (69.5 kg)   LMP 10/15/2022 (Exact Date)   SpO2 98%   BMI 24.00 kg/m   Wt Readings from Last 3 Encounters:  10/23/22 153 lb 4 oz (69.5 kg)  10/17/22 152 lb 9.6 oz (69.2 kg)  10/03/22 151 lb 8 oz (68.7 kg)      Physical Exam Vitals and nursing note reviewed.  Constitutional:      Appearance: Normal appearance. She is not ill-appearing.  HENT:     Head: Normocephalic and atraumatic.     Mouth/Throat:     Mouth: Mucous membranes are moist.     Pharynx: Oropharynx is clear. No oropharyngeal exudate or posterior oropharyngeal erythema.  Eyes:     Extraocular Movements: Extraocular movements  intact.     Conjunctiva/sclera: Conjunctivae normal.     Pupils: Pupils are equal, round, and reactive to light.  Neck:     Comments: FROM cervical neck  Cardiovascular:     Rate and Rhythm: Normal rate and regular rhythm.     Pulses: Normal pulses.     Heart sounds: Normal heart sounds. No murmur heard. Pulmonary:     Effort: Pulmonary effort is normal. No respiratory distress.     Breath sounds: Normal breath sounds. No wheezing, rhonchi or rales.  Musculoskeletal:     Cervical back: Normal range of motion and neck supple. No rigidity.     Right lower leg: No edema.     Left lower leg: No edema.  Lymphadenopathy:     Cervical: No  cervical adenopathy.  Skin:    General: Skin is warm and dry.     Findings: No rash.     Comments:  Scaling to distal 2nd index digit pulp without erythema or drainage  Neurological:     Mental Status: She is alert.     Comments:  CN 2-12 grossly intact Sensation intact to light touch BUE 5/5 strength BUE Neg spurling bilaterally  Psychiatric:        Mood and Affect: Mood normal.        Behavior: Behavior normal.       Results for orders placed or performed in visit on 06/23/22  Zinc  Result Value Ref Range   Zinc 88 44 - 115 ug/dL  VITAMIN D 25 Hydroxy (Vit-D Deficiency, Fractures)  Result Value Ref Range   Vit D, 25-Hydroxy 43.1 30.0 - 100.0 ng/mL  Iron, TIBC and Ferritin Panel  Result Value Ref Range   Total Iron Binding Capacity 473 (H) 250 - 450 ug/dL   UIBC 373 131 - 425 ug/dL   Iron 100 27 - 159 ug/dL   Iron Saturation 21 15 - 55 %   Ferritin 26 15 - 150 ng/mL  Vitamin B12  Result Value Ref Range   Vitamin B-12 1,501 (H) 232 - 1,245 pg/mL  Lipid panel  Result Value Ref Range   Cholesterol, Total 189 100 - 199 mg/dL   Triglycerides 331 (H) 0 - 149 mg/dL   HDL 51 >39 mg/dL   VLDL Cholesterol Cal 54 (H) 5 - 40 mg/dL   LDL Chol Calc (NIH) 84 0 - 99 mg/dL   Chol/HDL Ratio 3.7 0.0 - 4.4 ratio  Copper, Serum  Result Value Ref  Range   Copper 228 (H) 80 - 158 ug/dL    Assessment & Plan:   Problem List Items Addressed This Visit     CONSTIPATION, CHRONIC   Colonic inertia    Chronic issue.  Planning to try motegrity through local GI but there's currently a shortage of this medication.  Pending [facility] evaluation.       Chronic fatigue   Orthostatic intolerance   Paresthesias    Notes ongoing left sided numbness, neurological exam reassuring.       Paroxysmal tachycardia ([place])    Appreciate cardiology care.  Acute worsening post-COVID.  Tachycardia now controlled on metoprolol 12.'5mg'$  BID.       Hives   High blood copper level - Primary    She's seen hematology and gastroenterology with unrevealing evaluations. She continues zinc supplementation.  Pending evaluation by tertiary academic center [facility]) - pending GI and neurology appointments for this. Will appreciate their input.       Vitamin B12 deficiency    Levels high on daily b12, so she stopped supplementation. On latest b12 levels, down to 400s.  She wants to start intermittent B12 dosing to see effect on fatigue/low energy - reasonable to try.       Rash of finger    Saw derm, told eczematous rash, treating with Opzelura cream but doesn't notice significant benefit.  ?dishydrotic eczema      COVID-19 virus infection    Tested positive 09/12/2022. Predominant symptoms were heat intolerance, body aches, head congestion.  She did not take antiviral. She did receive prednisone taper shortly thereafter for ongoing symptoms.  After prednisone course, she developed tender rash to soles (?hives), muscle twitching, worsened tachycardia and presyncope. Anticipate inflammatory effects post-COVID that were unmasked after she finished prednisone course.  Chronic back pain    Reasonable to try chiropractor, but if any worsening neurological symptoms, advised to stop right away. Also provided with information on integrative therapies physical  therapy for her to look into.         No orders of the defined types were placed in this encounter.  No orders of the defined types were placed in this encounter.    Patient Instructions  Good to see you today  We will await [place] neurology and gastroenterology evaluations.  Look into integrative therapies (integrativetherapies.net) Ok to take a few B12 tablets per week over the next few weeks to see if improved energy levels. Let us know if helpful.   Follow up plan: Return if symptoms worsen or fail to improve.  [name], MD

## 2022-10-24 ENCOUNTER — Telehealth: Payer: Self-pay | Admitting: Family Medicine

## 2022-10-24 DIAGNOSIS — R21 Rash and other nonspecific skin eruption: Secondary | ICD-10-CM | POA: Insufficient documentation

## 2022-10-24 DIAGNOSIS — U071 COVID-19: Secondary | ICD-10-CM

## 2022-10-24 DIAGNOSIS — G8929 Other chronic pain: Secondary | ICD-10-CM | POA: Insufficient documentation

## 2022-10-24 HISTORY — DX: COVID-19: U07.1

## 2022-10-24 NOTE — Telephone Encounter (Signed)
Can we request latest eye exam for patient? Will need to cal her to see who she recently saw. Thanks.

## 2022-10-24 NOTE — Assessment & Plan Note (Signed)
Appreciate cardiology care.  Acute worsening post-COVID.  Tachycardia now controlled on metoprolol 12.'5mg'$  BID.

## 2022-10-24 NOTE — Assessment & Plan Note (Signed)
Notes ongoing left sided numbness, neurological exam reassuring.

## 2022-10-24 NOTE — Assessment & Plan Note (Signed)
Tested positive 09/12/2022. Predominant symptoms were heat intolerance, body aches, head congestion.  She did not take antiviral. She did receive prednisone taper shortly thereafter for ongoing symptoms.  After prednisone course, she developed tender rash to soles (?hives), muscle twitching, worsened tachycardia and presyncope. Anticipate inflammatory effects post-COVID that were unmasked after she finished prednisone course.

## 2022-10-24 NOTE — Assessment & Plan Note (Addendum)
Saw derm, told eczematous rash, treating with Opzelura cream but doesn't notice significant benefit.  ?dishydrotic eczema

## 2022-10-24 NOTE — Assessment & Plan Note (Addendum)
Reasonable to try chiropractor, but if any worsening neurological symptoms, advised to stop right away. Also provided with information on integrative therapies physical therapy for her to look into.

## 2022-10-24 NOTE — Telephone Encounter (Signed)
Noted.  Faxed records request to [facility] at [phone].

## 2022-10-24 NOTE — Assessment & Plan Note (Signed)
Chronic issue.  Planning to try motegrity through local GI but there's currently a shortage of this medication.  Pending [facility] evaluation.

## 2022-10-24 NOTE — Assessment & Plan Note (Signed)
Levels high on daily b12, so she stopped supplementation. On latest b12 levels, down to 400s.  She wants to start intermittent B12 dosing to see effect on fatigue/low energy - reasonable to try.

## 2022-10-24 NOTE — Assessment & Plan Note (Addendum)
She's seen hematology and gastroenterology with unrevealing evaluations. She continues zinc supplementation.  Pending evaluation by tertiary academic center [facility]) - pending GI and neurology appointments for this. Will appreciate their input.

## 2022-10-24 NOTE — Telephone Encounter (Signed)
Spoke to patient and was advised that she saw a doctor at [facility] 09/11/22 . Patient stated that she can not remember his name.

## 2022-11-07 ENCOUNTER — Encounter: Payer: Self-pay | Admitting: Cardiovascular Disease

## 2022-11-07 ENCOUNTER — Telehealth: Payer: Self-pay | Admitting: Cardiovascular Disease

## 2022-11-07 ENCOUNTER — Ambulatory Visit (INDEPENDENT_AMBULATORY_CARE_PROVIDER_SITE_OTHER): Payer: BC Managed Care – PPO

## 2022-11-07 ENCOUNTER — Ambulatory Visit: Payer: BC Managed Care – PPO | Attending: Cardiovascular Disease | Admitting: Cardiovascular Disease

## 2022-11-07 VITALS — BP 110/68 | HR 86 | Ht 67.0 in | Wt 154.3 lb

## 2022-11-07 DIAGNOSIS — I479 Paroxysmal tachycardia, unspecified: Secondary | ICD-10-CM | POA: Diagnosis not present

## 2022-11-07 DIAGNOSIS — R5382 Chronic fatigue, unspecified: Secondary | ICD-10-CM | POA: Diagnosis not present

## 2022-11-07 NOTE — Telephone Encounter (Signed)
I spoke with the patient.  She advised that she is: - SOB - tachycardiac, but better than a few weeks ago - having "gut" issues - nausea/ dry heaves - has to give presentations at work and at the 40 minute mark she is feeling pre-syncopal  She is post [place] in 08/2022 followed by a course of prednisone.  I inquired when she last saw her PCP and she advised this was on 10/23/22.  She states her PCP has referred her to neurology and GI, but they are out for ~ 6 months per the patient.  She advised that she is feeling palpitations in her back and is scared at this point.   She does not know what to do or who to go to.  I advised that I feel she would be best served by seeing the doctor as I cannot differentiate her symptoms over the phone, although I do think she would be best served with her PCP. She advised she has a hard time getting in with Primary Care.  I have offered her an appointment today with Dr. Rockey Situ at 3:20 pm for him to evaluate her symptoms and she if metoprolol adjustments are needed.  The patient voices understanding and is agreeable.

## 2022-11-07 NOTE — Progress Notes (Signed)
Cardiology Office Note  Date:  11/07/2022   ID:  Caroline [name], DOB 25-Mar-1991, MRN [id number]  PCP:  [name], MD   Chief Complaint  Patient presents with   Tachycardia    Patient c/o tachycardia, shortness of breath, chest pain that radiates into her back, dizziness and lightheaded. Patient is unable to function. Medications reviewed by the patient verbally.     HPI:  Caroline [name] is a 31 y.o. female with past medical history of Colonic inertia Chronic fatigue Dizziness with difficulty with balance and gait, heat intolerance and fatigability.  remote history of seizures with her last in 2007  Who presents for follow-up of her tachycardia, weakness  Last seen by myself July 2022 Seen by one of our providers October 2023 positive COVID test in September 2023 and recent 7-day course of steroids.  Since then she has had a difficult time, legs tired, having worsening back pain Episodes of feeling diaphoretic, has to sit down and recover Feels short of breath with exertion, visual changes at times Finding it difficult to do her job as she is unable to walk very far Heart rate seems to run fast with minimal exertion, we will slowly come down to baseline at rest  Orthostatics checked today, drop in pressure 662 systolic down to 947 with standing heart rate 74 up to 99  Prior cardiac studies reviewed echo Doppler study from December 03, 2019 which showed EF of 60 to 65% with normal wall motion.   EKG personally reviewed by myself on todays visit Shows normal sinus rhythm rate 86 bpm no significant ST-T wave changes   PMH:   has a past medical history of Colonic inertia (2007), COVID-19, Dysplastic nevus (11/19/2019), History of chicken pox, Influenza B (11/19/2018), and Seasonal allergic rhinitis.  PSH:    Past Surgical History:  Procedure Laterality Date   COLONOSCOPY  2010   WNL [name])   EXCISION MORTON'S NEUROMA Left 2007   WISDOM TOOTH EXTRACTION  2010     Current Outpatient Medications  Medication Sig Dispense Refill   cetirizine (ZYRTEC) 10 MG tablet Take 10 mg by mouth daily as needed for allergies.     Cholecalciferol (VITAMIN D3) 25 MCG (1000 UT) CAPS Take 1 capsule by mouth daily.     diphenhydrAMINE (BENADRYL) 25 mg capsule Take 50 mg by mouth every 6 (six) hours as needed for allergies or itching.     ketoconazole (NIZORAL) 2 % shampoo apply three times per week, massage into scalp and leave in for 10 minutes before rinsing out (Patient taking differently: as needed. apply three times per week, massage into scalp and leave in for 10 minutes before rinsing out) 120 mL 2   LORazepam (ATIVAN) 0.5 MG tablet Take 0.5-1 tablets (0.25-0.5 mg total) by mouth at bedtime as needed for anxiety or sleep. 20 tablet 0   metoprolol tartrate (LOPRESSOR) 25 MG tablet Take 0.5 tablet (12.5 mg) by mouth twice daily as needed for tachycardia 30 tablet 1   mometasone (ELOCON) 0.1 % cream Apply once to twice daily to itchy rash on chest, shoulders, back until improved. Avoid face, groin, underarms. (Patient taking differently: as needed. Apply once to twice daily to itchy rash on chest, shoulders, back until improved. Avoid face, groin, underarms.) 45 g 1   Multiple Vitamin (MULTIVITAMIN ADULT) TABS Take 1 tablet by mouth daily. Nature Made MVI for Her     Oxymetazoline HCl (RHOFADE) 1 % CREA Apply thin layer to face in am.  30 g 2   pimecrolimus (ELIDEL) 1 % cream Apply topically 2 (two) times daily. For rash anywhere on the body. 30 g 2   polyethylene glycol (MIRALAX / GLYCOLAX) packet Take 17 g by mouth daily. As needed     Ruxolitinib Phosphate (OPZELURA) 1.5 % CREA Apply to affected skin once a day 60 g 2   TRI-LO-MILI 0.18/0.215/0.25 MG-25 MCG tab Take 1 tablet by mouth daily.     triamcinolone (NASACORT) 55 MCG/ACT AERO nasal inhaler Place 2 sprays into the nose daily. 1 Inhaler    norgestimate-ethinyl estradiol (MILI) 0.25-35 MG-MCG tablet Take 1 tablet  by mouth daily. .'30mg'$  (Patient not taking: Reported on 11/07/2022)     predniSONE (DELTASONE) 20 MG tablet 3 tabs by mouth daily x 3 days, then 2 tabs by mouth daily x 2 days then 1 tab by mouth daily x 2 days (Patient not taking: Reported on 11/07/2022) 15 tablet 0   Prucalopride Succinate (MOTEGRITY) 2 MG TABS Take 1 tablet (2 mg total) by mouth daily. (Patient not taking: Reported on 10/23/2022) 30 tablet 2   No current facility-administered medications for this visit.    Allergies:   Amoxicillin, Doxycycline, Linzess [linaclotide], Neomycin, Nickel, and Other   Social History:  The patient  reports that she has never smoked. She has never used smokeless tobacco. She reports current alcohol use. She reports that she does not use drugs.   Family History:   family history includes Atrial fibrillation in her father; COPD in her father; Cancer in her father and paternal grandmother; Cirrhosis in her father; Colon cancer in her father and paternal aunt; Dementia in her maternal grandmother; Diabetes in her father; Heart failure in her maternal grandmother; Hypertension in her father, maternal grandmother, mother, and paternal grandmother; Hypothyroidism in her mother; Liver disease in her father; Stroke in her maternal grandmother.    Review of Systems: Review of Systems  Constitutional:  Positive for malaise/fatigue.  HENT: Negative.    Respiratory:  Positive for shortness of breath.   Cardiovascular:  Positive for palpitations.       Tachycardia   Gastrointestinal: Negative.   Musculoskeletal: Negative.   Neurological: Negative.   Psychiatric/Behavioral: Negative.    All other systems reviewed and are negative.    PHYSICAL EXAM: VS:  BP 110/68 (BP Location: Left Arm, Patient Position: Sitting, Cuff Size: Normal)   Pulse 86   Ht '5\' 7"'$  (1.702 m)   Wt 154 lb 5 oz (70 kg)   LMP 10/15/2022 (Exact Date)   SpO2 98%   BMI 24.17 kg/m  , BMI Body mass index is 24.17  kg/m. Constitutional:  oriented to person, place, and time. No distress.  HENT:  Head: Grossly normal Eyes:  no discharge. No scleral icterus.  Neck: No JVD, no carotid bruits  Cardiovascular: Regular rate and rhythm, no murmurs appreciated Pulmonary/Chest: Clear to auscultation bilaterally, no wheezes or rails Abdominal: Soft.  no distension.  no tenderness.  Musculoskeletal: Normal range of motion Neurological:  normal muscle tone. Coordination normal. No atrophy Skin: Skin warm and dry Psychiatric: normal affect, pleasant  Recent Labs: 12/30/2021: TSH 2.570    Lipid Panel Lab Results  Component Value Date   CHOL 189 06/29/2022   HDL 51 06/29/2022   LDLCALC 84 06/29/2022   TRIG 331 (H) 06/29/2022      Wt Readings from Last 3 Encounters:  11/07/22 154 lb 5 oz (70 kg)  10/23/22 153 lb 4 oz (69.5 kg)  10/17/22 152 lb  9.6 oz (69.2 kg)     ASSESSMENT AND PLAN:  Problem List Items Addressed This Visit       Cardiology Problems   Paroxysmal tachycardia ([place]) - Primary   Relevant Orders   EKG 12-Lead   ECHOCARDIOGRAM COMPLETE   LONG TERM MONITOR (3-14 DAYS)     Other   Chronic fatigue   Relevant Orders   EKG 12-Lead  Sinus tachycardia Long history of sinus tachycardia, may have been exacerbated recently following COVID Recommend repeat Zio monitor Given shortness of breath, weakness, echocardiogram ordered to rule out effects from [place] she stay rested, hydrated, low-dose metoprolol for rate control Reports that she has consultation is pending with allergy, neurology, GI    Total encounter time more than 30 minutes  Greater than 50% was spent in counseling and coordination of care with the patient     Signed, Esmond Plants, M.D., Ph.D. [place], [place]

## 2022-11-07 NOTE — Telephone Encounter (Signed)
STAT if HR is under 50 or over 120 (normal HR is 60-100 beats per minute)  What is your heart rate?   HR 98  Do you have a log of your heart rate readings (document readings)?  Yes - on FitBit   Do you have any other symptoms?  SOB, back palpitations on left side, dry heaving and super nauseous  Patient stated she can't get control of her symptoms and is concerned her metoprolol is not working.

## 2022-11-07 NOTE — Patient Instructions (Addendum)
Medication Instructions:  No changes  If you need a refill on your cardiac medications before your next appointment, please call your pharmacy.   Lab work: No new labs needed  Testing/Procedures:  Echocardiogram  Your physician has requested that you have an echocardiogram. Echocardiography is a painless test that uses sound waves to create images of your heart. It provides your doctor with information about the size and shape of your heart and how well your heart's chambers and valves are working. This procedure takes approximately one hour. There are no restrictions for this procedure. Please note; depending on visual quality an IV may need to be placed.   2. Your physician has recommended that you wear a Zio monitor.   This monitor is a medical device that records the heart's electrical activity. Doctors most often use these monitors to diagnose arrhythmias. Arrhythmias are problems with the speed or rhythm of the heartbeat. The monitor is a small device applied to your chest. You can wear one while you do your normal daily activities. While wearing this monitor if you have any symptoms to push the button and record what you felt. Once you have worn this monitor for the period of time provider prescribed (Usually 14 days), you will return the monitor device in the postage paid box. Once it is returned they will download the data collected and provide [place] with a report which the provider will then review and we will call you with those results. Important tips:  Avoid showering during the first 24 hours of wearing the monitor. Avoid excessive sweating to help maximize wear time. Do not submerge the device, no hot tubs, and no swimming pools. Keep any lotions or oils away from the patch. After 24 hours you may shower with the patch on. Take brief showers with your back facing the shower head.  Do not remove patch once it has been placed because that will interrupt data and decrease adhesive  wear time. Push the button when you have any symptoms and write down what you were feeling. Once you have completed wearing your monitor, remove and place into box which has postage paid and place in your outgoing mailbox.  If for some reason you have misplaced your box then call our office and we can provide another box and/or mail it off for you.      Follow-Up: At [facility], you and your health needs are our priority.  As part of our continuing mission to provide you with exceptional heart care, we have created designated Provider Care Teams.  These Care Teams include your primary Cardiologist (physician) and Advanced Practice Providers (APPs -  Physician Assistants and Nurse Practitioners) who all work together to provide you with the care you need, when you need it.  You will need a follow up appointment as needed  Providers on your designated Care Team:   Murray Hodgkins, NP Christell Faith, PA-C Cadence Kathlen Mody, [place]  COVID-19 Vaccine Information can be found at: [email or web address] For questions related to vaccine distribution or appointments, please email [email or web address] or call [phone].  \

## 2022-11-09 ENCOUNTER — Other Ambulatory Visit: Payer: Self-pay | Admitting: Cardiovascular Disease

## 2022-11-09 ENCOUNTER — Telehealth: Payer: Self-pay | Admitting: Family Medicine

## 2022-11-09 ENCOUNTER — Telehealth: Payer: Self-pay | Admitting: Cardiovascular Disease

## 2022-11-09 DIAGNOSIS — R0602 Shortness of breath: Secondary | ICD-10-CM | POA: Diagnosis not present

## 2022-11-09 NOTE — Telephone Encounter (Signed)
[facility] - Client TELEPHONE ADVICE RECORD AccessNurse Patient Name: Natayah COFFE Y Gender: Female DOB: [birth year] Age: 31 Y 11 M 7 D Return Phone Number: [phone] (Primary) Address: City/ State/ Zip: [place]  [address] Client [facility] - Client Client Site [place] Provider [name] - MD Contact Type Call Who Is Calling Patient / Member / Family / Caregiver Call Type Triage / Clinical Relationship To Patient Self Return Phone Number [phone] (Primary) Chief Complaint BREATHING - shortness of breath or sounds breathless Reason for Call Symptomatic / Request for Health Information Initial Comment Caller states c/o shortness of breath, can't walk or move without feeling out of breathe. [place] Not Listed EMs to decide. Translation No Nurse Assessment Nurse: Raenette Rover, RN, Zella Ball Date/Time (Eastern Time): 11/09/2022 11:39:51 AM Confirm and document reason for call. If symptomatic, describe symptoms. ---Caller states c/o shortness of breath, can't walk or move without feeling out of breathe. Saw a cardiologist Tuesday and put on heart med for tachycardia and on metroprolol and when she takes a deep breath feels sob. Does the patient have any new or worsening symptoms? ---Yes Will a triage be completed? ---Yes Related visit to physician within the last 2 weeks? ---Yes Does the PT have any chronic conditions? (i.e. diabetes, asthma, this includes High risk factors for pregnancy, etc.) ---No Is the patient pregnant or possibly pregnant? (Ask all females between the ages of 65-55) ---No Is this a behavioral health or substance abuse call? ---No Guidelines Guideline Title Affirmed Question Affirmed Notes Nurse Date/Time (Eastern Time) Chest Pain SEVERE difficulty breathing (e.g., struggling for each breath, speaks in single words) Raenette Rover, RN, Zella Ball  11/09/2022 11:42:36 AM PLEASE NOTE: All timestamps contained within this report are represented as [place] Standard Time. CONFIDENTIALTY NOTICE: This fax transmission is intended only for the addressee. It contains information that is legally privileged, confidential or otherwise protected from use or disclosure. If you are not the intended recipient, you are strictly prohibited from reviewing, disclosing, copying using or disseminating any of this information or taking any action in reliance on or regarding this information. If you have received this fax in error, please notify us immediately by telephone so that we can arrange for its return to [place]. Phone: [phone], Toll-Free: [phone], Fax: [phone] Page: 2 of 2 Call Id: [id number] [place]. Time Eilene Ghazi Time) Disposition Final User 11/09/2022 11:38:21 AM Send to Urgent Queue [facility] 11/09/2022 11:42:57 AM Call EMS 911 Now Yes Raenette Rover, RN, [facility] 11/09/2022 11:50:25 AM Reader, RN, Zella Ball Reason: Moms driving. Final Disposition 11/09/2022 11:42:57 AM Call EMS 911 Now Yes Raenette Rover, RN, [facility]er Disagree/Comply Comply Caller Understands Yes PreDisposition InappropriateToAsk Care Advice Given Per Guideline CALL EMS 911 NOW: CARE ADVICE given per Chest Pain (Adult) guideline. Comments User: Wilson Singer, RN Date/Time Eilene Ghazi Time): 11/09/2022 11:47:20 AM Correction caller states when she takes a deep breath feels like passing out. She is sob and breathing shallow on the phone. User: Wilson Singer, RN Date/Time Eilene Ghazi Time): 11/09/2022 11:47:37 AM Also having tightness in her chest. User: Wilson Singer, RN Date/Time Eilene Ghazi Time): 11/09/2022 11:48:04 AM At first was going to have her mom drive her to the ER and now has decided to call EMS. Referrals GO TO FACILITY OTHER - [facility]

## 2022-11-09 NOTE — Telephone Encounter (Signed)
Secure chat message received from Dr. Rockey Situ:  We could do a d-dimer, will likely be elevated (she has other inflammatory issues going). will likely lead to a chest CTA I suspect. echo would be helpful. symptoms are all over the place

## 2022-11-09 NOTE — Telephone Encounter (Signed)
Will await ER evaluation. Plz call tomorrow for update.

## 2022-11-09 NOTE — Telephone Encounter (Signed)
Patient called back to follow-up on her next steps regarding SOB.

## 2022-11-09 NOTE — Telephone Encounter (Signed)
Dr. Rockey Situ- please advise.  With her history of COVID and SOB do you think she needs to have a d-dimer?

## 2022-11-09 NOTE — Telephone Encounter (Signed)
Noted. Thanks.

## 2022-11-09 NOTE — Telephone Encounter (Signed)
Per access nurse note pt agreed to go by EMS to ED. Sending note to Dr Darnell Level who is out of office and Dr Damita Dunnings who is in office and [place] pool.

## 2022-11-09 NOTE — Telephone Encounter (Signed)
  Pt c/o Shortness Of Breath: STAT if SOB developed within the last 24 hours or pt is noticeably SOB on the phone  1. Are you currently SOB (can you hear that pt is SOB on the phone)? No   2. How long have you been experiencing SOB? Since she saw Dr. Rockey Situ   3. Are you SOB when sitting or when up moving around? Moving around and laying down   4. Are you currently experiencing any other symptoms? Pt said, her SOB is getting worst specially when laying down. She said she can't move around much as well. She ask if there's an prescription or any she can do while waiting for all of the test she needs to get

## 2022-11-09 NOTE — Telephone Encounter (Signed)
Called patient back and informed her of Dr. Donivan Scull recommendation as documented below. Patient requested that the lab order be faxed to [facility] on [address] in [place]. (Faxed to [phone]).   Patient also asked "what should she do in the meantime"? I asked her to clarify, she stated that she is having a hard time breathing while just walking in her house and she is having a hard time moving around. I advised that if she is having trouble walking because of being SOB, she should go to the ED to be evaluated.   Patient verbalized understanding and agreed with plan.

## 2022-11-09 NOTE — Telephone Encounter (Signed)
Noted  

## 2022-11-09 NOTE — Telephone Encounter (Signed)
Patient called in stating that she has experiencing SOB for some time and now having difficulty breathing. She stated that she went to see her Cardiologist and they stated her lungs are clear. She said when she take deep breathes she black out and she isn't able to move and walk without SOB. Sent over to access nurse.

## 2022-11-10 ENCOUNTER — Other Ambulatory Visit: Payer: Self-pay | Admitting: Cardiovascular Disease

## 2022-11-10 DIAGNOSIS — I479 Paroxysmal tachycardia, unspecified: Secondary | ICD-10-CM

## 2022-11-10 DIAGNOSIS — R0602 Shortness of breath: Secondary | ICD-10-CM

## 2022-11-10 LAB — D-DIMER, QUANTITATIVE: D-DIMER: 0.21 mg/L FEU (ref 0.00–0.49)

## 2022-11-10 NOTE — Telephone Encounter (Addendum)
I called pt to speak with her re: below - but unable to reach.

## 2022-11-10 NOTE — Telephone Encounter (Signed)
Left message on voicemail for patient to call the office back. 

## 2022-11-10 NOTE — Telephone Encounter (Signed)
Pt called back returning Regina's missed call. Pt  stated she didn't go to the ER, instead she spoke with her cardiologist & had a  "d dimer test" performed, results came back that everything was okay. Pt still feels out of breath & is requesting advice on what to do. Pt is scheduled to see Dr. on [address] [place], 11/14/22. Call back # [phone]

## 2022-11-10 NOTE — Telephone Encounter (Signed)
D dimer returned 0.21.  If unable to move due to shortness of breath, recommend ER evaluation as previously recommended.  I don't see that cardiology added any new medication besides the low dose metoprolol PRN.  She does have further evaluation planned for December ie echocardiogram, repeat zio patch through cardiology however if severe activity limiting, shortness of breath needs urgent evaluation.

## 2022-11-13 NOTE — Telephone Encounter (Signed)
Patient scheduled an appointment tomorrow 11/14/22 with Dr. [name].at 9:30. Dr. [name] aware.

## 2022-11-14 ENCOUNTER — Ambulatory Visit: Payer: BC Managed Care – PPO | Admitting: Family Medicine

## 2022-11-14 ENCOUNTER — Encounter: Payer: Self-pay | Admitting: Family Medicine

## 2022-11-14 ENCOUNTER — Ambulatory Visit (INDEPENDENT_AMBULATORY_CARE_PROVIDER_SITE_OTHER)
Admission: RE | Admit: 2022-11-14 | Discharge: 2022-11-14 | Disposition: A | Discharge status: Home / Self Care | Payer: BC Managed Care – PPO | Source: Ambulatory Visit | Attending: Family Medicine | Admitting: Family Medicine

## 2022-11-14 VITALS — BP 112/90 | HR 107 | Temp 97.5°F | Ht 67.0 in | Wt 155.4 lb

## 2022-11-14 DIAGNOSIS — R29898 Other symptoms and signs involving the musculoskeletal system: Secondary | ICD-10-CM

## 2022-11-14 DIAGNOSIS — R0602 Shortness of breath: Secondary | ICD-10-CM

## 2022-11-14 DIAGNOSIS — R5382 Chronic fatigue, unspecified: Secondary | ICD-10-CM

## 2022-11-14 DIAGNOSIS — I951 Orthostatic hypotension: Secondary | ICD-10-CM

## 2022-11-14 DIAGNOSIS — H539 Unspecified visual disturbance: Secondary | ICD-10-CM | POA: Diagnosis not present

## 2022-11-14 DIAGNOSIS — U099 Post covid-19 condition, unspecified: Secondary | ICD-10-CM

## 2022-11-14 DIAGNOSIS — E348 Other specified endocrine disorders: Secondary | ICD-10-CM

## 2022-11-14 DIAGNOSIS — R7879 Finding of abnormal level of heavy metals in blood: Secondary | ICD-10-CM

## 2022-11-14 DIAGNOSIS — R202 Paresthesia of skin: Secondary | ICD-10-CM | POA: Diagnosis not present

## 2022-11-14 NOTE — Patient Instructions (Addendum)
Ambulatory pulse ox today.  Chest xray today  Good to see you today.  Try hydroxyzine 1/2-1 tab as needed for shortness of breath, racing heart.  Labs today.  We will refer you back to Dr [name] for repeat evaluation.

## 2022-11-14 NOTE — Progress Notes (Signed)
Patient ID: Caroline [name], female    DOB: [birth year], 31 y.o.   MRN: [id number]  This visit was conducted in person.  BP (!) 112/90   Pulse (!) 107   Temp (!) 97.5 F (36.4 C) (Temporal)   Ht _0  (1.702 m)   Wt 155 lb 6.4 oz (70.5 kg)   LMP 10/15/2022 (Exact Date)   SpO2 100%   BMI 24.34 kg/m   BP Readings from Last 3 Encounters:  11/14/22 (!) 112/90  11/07/22 110/68  10/23/22 120/80   Pulse Readings from Last 3 Encounters:  11/14/22 (!) 107  11/07/22 86  10/23/22 78   Maintains oxygenation during ambulatory pulse ox.   CC: shortness of breath Subjective:   HPI: Caroline [name] is a 31 y.o. female presenting on 11/14/2022 for Shortness of Breath (Inability to drive, vision problems, nausea and vomiting has a heart monitor on now. )   See prior notes for details.  [place] has a past medical history of Chronic fatigue (06/26/2018), Colonic inertia (2007), CONSTIPATION, CHRONIC (05/30/2010), COVID-19, COVID-19 virus infection (10/24/2022), Dysplastic nevus (11/19/2019), High blood copper level (10/07/2021), History of chicken pox, Hives (08/16/2021), Influenza B (11/19/2018), Orthostatic intolerance (06/20/2019), Paroxysmal tachycardia ([place]) (05/12/2021), Seasonal allergic rhinitis, and White matter abnormality on MRI of brain (01/31/2016).  COVID infection 08/2022. Symptoms acutely worsened after this.   Recently saw cardiology, note reviewed.  Reassuring EKG.  Recent D Dimer test through cardiology was normal (0.21).  Known paroxysmal tachycardia followed by cardiology on metoprolol 12.40m BID PRN.  She currently has Zio patch on, and has scheduled echocardiogram for next month for further evaluation of current symptoms. ?are symptoms residual of recent COVID infection.   She's felt very bad recently: 1. Constant floaters.  2. Numbness/tingling to head.  3. Constant exhaustion/fatigue.  4. Dyspnea with exertion and with talking.  5. Presyncopal even when sitting for  prolonged period of time.  6. Dizzy and unsteady - doesn't feel safe driving or even with prolonged walking.  7. Legs feel weak, give out when she walks short distances.  8. Alternating fast and slow heart rates.  9. Ear aches and sinus congestion, nausea, tinnitus.  10. Tremors to hands and legs   Stress levels are actually great. New job starting mid December - she is very excited about new job at [employer] Worried about being able to do this job. Hasn't noted any benefit of trial of lorazepam.  Continues daily antihistamine + nasacort.   Currently on OCP Tri-Lo-Mili 0.18/0.215/0.248mdaily.  ____ She's had rheumatology, gastroenterology, neurology, allergy/immunology and dermatology evaluations in the community. Now pending evaluation at [facility] (GI and neurology). Hasn't heard about results of [facility] allergy testing from 08/2022. Reviewing chart, she had normal ANA screen,normal serum tryptase, alpha gal IgE, immunoglobulin levels.   LMP - 10/15/2022.      Relevant past medical, surgical, family and social history reviewed and updated as indicated. Interim medical history since our last visit reviewed. Allergies and medications reviewed and updated. Outpatient Medications Prior to Visit  Medication Sig Dispense Refill   cetirizine (ZYRTEC) 10 MG tablet Take 10 mg by mouth daily as needed for allergies.     Cholecalciferol (VITAMIN D3) 25 MCG (1000 UT) CAPS Take 1 capsule by mouth daily.     diphenhydrAMINE (BENADRYL) 25 mg capsule Take 50 mg by mouth every 6 (six) hours as needed for allergies or itching.     ketoconazole (NIZORAL) 2 % shampoo apply three  times per week, massage into scalp and leave in for 10 minutes before rinsing out (Patient taking differently: as needed. apply three times per week, massage into scalp and leave in for 10 minutes before rinsing out) 120 mL 2   LORazepam (ATIVAN) 0.5 MG tablet Take 0.5-1 tablets (0.25-0.5 mg total) by mouth at bedtime as  needed for anxiety or sleep. 20 tablet 0   metoprolol tartrate (LOPRESSOR) 25 MG tablet Take 0.5 tablet (12.5 mg) by mouth twice daily as needed for tachycardia 30 tablet 1   mometasone (ELOCON) 0.1 % cream Apply once to twice daily to itchy rash on chest, shoulders, back until improved. Avoid face, groin, underarms. (Patient taking differently: as needed. Apply once to twice daily to itchy rash on chest, shoulders, back until improved. Avoid face, groin, underarms.) 45 g 1   Multiple Vitamin (MULTIVITAMIN ADULT) TABS Take 1 tablet by mouth daily. Nature Made MVI for Her     Oxymetazoline HCl (RHOFADE) 1 % CREA Apply thin layer to face in am. 30 g 2   pimecrolimus (ELIDEL) 1 % cream Apply topically 2 (two) times daily. For rash anywhere on the body. 30 g 2   polyethylene glycol (MIRALAX / GLYCOLAX) packet Take 17 g by mouth daily. As needed     Prucalopride Succinate (MOTEGRITY) 2 MG TABS Take 1 tablet (2 mg total) by mouth daily. 30 tablet 2   Ruxolitinib Phosphate (OPZELURA) 1.5 % CREA Apply to affected skin once a day 60 g 2   TRI-LO-MILI 0.18/0.215/0.25 MG-25 MCG tab Take 1 tablet by mouth daily.     triamcinolone (NASACORT) 55 MCG/ACT AERO nasal inhaler Place 2 sprays into the nose daily. 1 Inhaler    norgestimate-ethinyl estradiol (MILI) 0.25-35 MG-MCG tablet Take 1 tablet by mouth daily. .35m     predniSONE (DELTASONE) 20 MG tablet 3 tabs by mouth daily x 3 days, then 2 tabs by mouth daily x 2 days then 1 tab by mouth daily x 2 days 15 tablet 0   No facility-administered medications prior to visit.     Per HPI unless specifically indicated in ROS section below Review of Systems  Objective:  BP (!) 112/90   Pulse (!) 107   Temp (!) 97.5 F (36.4 C) (Temporal)   Ht _0  (1.702 m)   Wt 155 lb 6.4 oz (70.5 kg)   LMP 10/15/2022 (Exact Date)   SpO2 100%   BMI 24.34 kg/m   Wt Readings from Last 3 Encounters:  11/14/22 155 lb 6.4 oz (70.5 kg)  11/07/22 154 lb 5 oz (70 kg)  10/23/22  153 lb 4 oz (69.5 kg)      Physical Exam Vitals and nursing note reviewed.  Constitutional:      Appearance: Normal appearance. She is not ill-appearing.  HENT:     Head: Normocephalic and atraumatic.     Nose: Nose normal.     Mouth/Throat:     Mouth: Mucous membranes are moist.     Pharynx: Oropharynx is clear. No oropharyngeal exudate or posterior oropharyngeal erythema.  Eyes:     Extraocular Movements: Extraocular movements intact.     Conjunctiva/sclera: Conjunctivae normal.     Pupils: Pupils are equal, round, and reactive to light.  Neck:     Thyroid: No thyroid mass or thyromegaly.     Vascular: No carotid bruit.  Cardiovascular:     Rate and Rhythm: Normal rate and regular rhythm.     Pulses: Normal pulses.  Heart sounds: Normal heart sounds. No murmur heard. Pulmonary:     Effort: Pulmonary effort is normal. No respiratory distress.     Breath sounds: Normal breath sounds. No wheezing, rhonchi or rales.  Abdominal:     General: Bowel sounds are normal. There is no distension.     Palpations: Abdomen is soft. There is no mass.     Tenderness: There is no abdominal tenderness. There is no guarding or rebound.     Hernia: No hernia is present.  Musculoskeletal:     Cervical back: Normal range of motion and neck supple.     Right lower leg: No edema.     Left lower leg: No edema.  Skin:    General: Skin is warm and dry.     Findings: No rash.  Neurological:     Mental Status: She is alert.     Sensory: Sensation is intact.     Motor: Motor function is intact.     Coordination: Coordination is intact.     Gait: Gait is intact.     Deep Tendon Reflexes:     Reflex Scores:      Bicep reflexes are 2+ on the right side and 2+ on the left side.      Brachioradialis reflexes are 2+ on the right side and 2+ on the left side.      Patellar reflexes are 2+ on the right side and 2+ on the left side.      Achilles reflexes are 2+ on the right side and 2+ on the left  side.    Comments:  CN grossly intact 5/5 strength BUE, BLE 2+ DTRs  Psychiatric:        Mood and Affect: Mood normal.        Behavior: Behavior normal.       Results for orders placed or performed in visit on 11/14/22  Comprehensive metabolic panel  Result Value Ref Range   Glucose 86 70 - 99 mg/dL   BUN 7 6 - 20 mg/dL   Creatinine, Ser 0.61 0.57 - 1.00 mg/dL   eGFR 123 >59 mL/min/1.73   BUN/Creatinine Ratio 11 9 - 23   Sodium 140 134 - 144 mmol/L   Potassium 4.3 3.5 - 5.2 mmol/L   Chloride 101 96 - 106 mmol/L   CO2 24 20 - 29 mmol/L   Calcium 9.8 8.7 - 10.2 mg/dL   Total Protein 6.8 6.0 - 8.5 g/dL   Albumin 4.4 4.0 - 5.0 g/dL   Globulin, Total 2.4 1.5 - 4.5 g/dL   Albumin/Globulin Ratio 1.8 1.2 - 2.2   Bilirubin Total 0.2 0.0 - 1.2 mg/dL   Alkaline Phosphatase 62 44 - 121 IU/L   AST 18 0 - 40 IU/L   ALT 7 0 - 32 IU/L  CBC with Differential/Platelet  Result Value Ref Range   WBC 7.6 3.4 - 10.8 x10E3/uL   RBC 4.87 3.77 - 5.28 x10E6/uL   Hemoglobin 13.8 11.1 - 15.9 g/dL   Hematocrit 41.9 34.0 - 46.6 %   MCV 86 79 - 97 fL   MCH 28.3 26.6 - 33.0 pg   MCHC 32.9 31.5 - 35.7 g/dL   RDW 12.4 11.7 - 15.4 %   Platelets 283 150 - 450 x10E3/uL   Neutrophils 66 Not Estab. %   Lymphs 28 Not Estab. %   Monocytes 5 Not Estab. %   Eos 1 Not Estab. %   Basos 0 Not Estab. %  Neutrophils Absolute 5.0 1.4 - 7.0 x10E3/uL   Lymphocytes Absolute 2.1 0.7 - 3.1 x10E3/uL   Monocytes Absolute 0.4 0.1 - 0.9 x10E3/uL   EOS (ABSOLUTE) 0.0 0.0 - 0.4 x10E3/uL   Basophils Absolute 0.0 0.0 - 0.2 x10E3/uL   Immature Granulocytes 0 Not Estab. %   Immature Grans (Abs) 0.0 0.0 - 0.1 x10E3/uL  TSH  Result Value Ref Range   TSH 2.730 0.450 - 4.500 uIU/mL  CK  Result Value Ref Range   Total CK 29 (L) 32 - 182 U/L  Magnesium  Result Value Ref Range   Magnesium 2.1 1.6 - 2.3 mg/dL  Copper, Serum  Result Value Ref Range   Copper WILL FOLLOW       11/14/2022    9:31 AM 06/20/2019   11:35 AM   Depression screen PHQ 2/9  Decreased Interest 0 0  Down, Depressed, Hopeless 0 0  PHQ - 2 Score 0 0    DG Chest 2 View CLINICAL DATA:  Shortness of breath.  EXAM: CHEST - 2 VIEW  COMPARISON:  Chest radiograph September 04, 2019  FINDINGS: The heart size and mediastinal contours are within normal limits. Both lungs are clear. The visualized skeletal structures are unremarkable. Loop recorder.  IMPRESSION: No active cardiopulmonary disease.  Electronically Signed   By: Lovey Newcomer M.D.   On: 11/15/2022 13:13   Assessment & Plan:  Ambulatory pulse ox today - normal  Problem List Items Addressed This Visit       Unprioritized   Pineal gland cyst   Relevant Orders   Ambulatory referral to Neurology   Chronic fatigue   Relevant Orders   Comprehensive metabolic panel (Completed)   CBC with Differential/Platelet (Completed)   TSH (Completed)   Magnesium (Completed)   Orthostatic intolerance    Ongoing fluctuations in blood pressures and pulse, presyncope even with sitting, unsteadiness and leg weakness with standing/walking, tremor, paresthesias.  Pending cardiology evaluation with zio patch and echo.  ?post-COVID sequelae.  Will refer back to neurology for re-evaluation.       Relevant Orders   CBC with Differential/Platelet (Completed)   Ambulatory referral to Neurology   Paresthesias    Notes paresthesias to head, hands, legs as well as extremity tremors and leg weakness with minimal exertion.  Having difficulty scheduling appointment with [facility], hasn't heard back.  Update labwork today, consider return to local neurologist for re-evaluation while awaiting tertiary care evaluation.  Given neurological symptoms that are acutely worse post-COVID, will recommend neurologist evaluation prior to proceeding with influenza vaccine. Will write letter accordingly for upcoming new [facility] employment as they require flu shot prior to starting employment.        Relevant Orders   TSH (Completed)   Ambulatory referral to Neurology   High blood copper level   Relevant Orders   Copper, Serum (Completed)   Shortness of breath - Primary    Of unclear etiology, acutely worse since COVID infection 08/2022 - ?post-COVID symptoms. Normal D Dimer pointing against acute PE. No lower extremity unilateral swelling noted.  She is currently undergoing cardiac evaluation with Zio patch and echocadrdiogram.  Will update CXR.  Reassuring ambulatory pulse ox today.  Will await cardiology evaluation.       Relevant Orders   DG Chest 2 View (Completed)   Comprehensive metabolic panel (Completed)   CBC with Differential/Platelet (Completed)   TSH (Completed)   Vision changes    Notes worsening floaters. She is followed by eye doctor ([facility]) with recent exam - I am awaiting records.       Leg weakness, bilateral    Bilateral subjective weakness with ambulation.  Update CPK.       Relevant Orders   TSH (Completed)   CK (Completed)   Post covid-19 condition, unspecified    ?current worsened symptoms due to post-COVID sequelae.  Await cardiology evaluation.         No orders of the defined types were placed in this encounter.  Orders Placed This Encounter  Procedures   DG Chest 2 View    Standing Status:   Future    Number of Occurrences:   1    Standing Expiration Date:   11/15/2023    Order Specific Question:   Reason for Exam (SYMPTOM  OR DIAGNOSIS REQUIRED)    Answer:   dyspnea    Order Specific Question:   Is patient pregnant?    Answer:   No    Order Specific Question:   Preferred imaging location?    Answer:   [facility]   Comprehensive metabolic panel   CBC with Differential/Platelet   TSH   CK   Magnesium   Copper, Serum   Ambulatory referral to Neurology    Referral Priority:   Routine    Referral Type:   Consultation    Referral Reason:   Specialty Services Required    Requested Specialty:   Neurology     Number of Visits Requested:   1     Patient Instructions  Ambulatory pulse ox today.  Chest xray today  Good to see you today.  Try hydroxyzine 1/2-1 tab as needed for shortness of breath, racing heart.  Labs today.  We will refer you back to Dr [name] for repeat evaluation.   Follow up plan: Return if symptoms worsen or fail to improve.  [name], MD

## 2022-11-18 ENCOUNTER — Encounter: Payer: Self-pay | Admitting: Family Medicine

## 2022-11-18 DIAGNOSIS — R0602 Shortness of breath: Secondary | ICD-10-CM | POA: Insufficient documentation

## 2022-11-18 DIAGNOSIS — H539 Unspecified visual disturbance: Secondary | ICD-10-CM | POA: Insufficient documentation

## 2022-11-18 DIAGNOSIS — U099 Post covid-19 condition, unspecified: Secondary | ICD-10-CM | POA: Insufficient documentation

## 2022-11-18 DIAGNOSIS — R29898 Other symptoms and signs involving the musculoskeletal system: Secondary | ICD-10-CM | POA: Insufficient documentation

## 2022-11-18 NOTE — Assessment & Plan Note (Addendum)
Ongoing fluctuations in blood pressures and pulse, presyncope even with sitting, unsteadiness and leg weakness with standing/walking, tremor, paresthesias.  Pending cardiology evaluation with zio patch and echo.  ?post-COVID sequelae.  Will refer back to neurology for re-evaluation.

## 2022-11-18 NOTE — Assessment & Plan Note (Deleted)
?  current worsened symptoms due to post-COVID sequelae.  Await cardiology evaluation.

## 2022-11-18 NOTE — Assessment & Plan Note (Signed)
Notes worsening floaters. She is followed by eye doctor ([facility]) with recent exam - I am awaiting records.

## 2022-11-18 NOTE — Assessment & Plan Note (Signed)
?  current worsened symptoms due to post-COVID sequelae.  Await cardiology evaluation.

## 2022-11-18 NOTE — Assessment & Plan Note (Addendum)
Notes paresthesias to head, hands, legs as well as extremity tremors and leg weakness with minimal exertion.  Having difficulty scheduling appointment with [facility], hasn't heard back.  Update labwork today, consider return to local neurologist for re-evaluation while awaiting tertiary care evaluation.  Given neurological symptoms that are acutely worse post-COVID, will recommend neurologist evaluation prior to proceeding with influenza vaccine. Will write letter accordingly for upcoming new [facility] employment as they require flu shot prior to starting employment.

## 2022-11-18 NOTE — Assessment & Plan Note (Addendum)
Of unclear etiology, acutely worse since COVID infection 08/2022 - ?post-COVID symptoms. Normal D Dimer pointing against acute PE. No lower extremity unilateral swelling noted.  She is currently undergoing cardiac evaluation with Zio patch and echocadrdiogram.  Will update CXR.  Reassuring ambulatory pulse ox today.  Will await cardiology evaluation.

## 2022-11-18 NOTE — Assessment & Plan Note (Signed)
Bilateral subjective weakness with ambulation.  Update CPK.

## 2022-11-20 ENCOUNTER — Other Ambulatory Visit: Payer: Self-pay

## 2022-11-20 DIAGNOSIS — R5382 Chronic fatigue, unspecified: Secondary | ICD-10-CM

## 2022-11-23 ENCOUNTER — Emergency Department
Admission: EM | Admit: 2022-11-23 | Discharge: 2022-11-23 | Disposition: A | Discharge status: Home / Self Care | Payer: BC Managed Care – PPO | Attending: Emergency Medicine | Admitting: Emergency Medicine

## 2022-11-23 ENCOUNTER — Encounter: Payer: Self-pay | Admitting: Emergency Medicine

## 2022-11-23 ENCOUNTER — Other Ambulatory Visit: Payer: Self-pay

## 2022-11-23 DIAGNOSIS — R55 Syncope and collapse: Secondary | ICD-10-CM | POA: Insufficient documentation

## 2022-11-23 DIAGNOSIS — R29898 Other symptoms and signs involving the musculoskeletal system: Secondary | ICD-10-CM

## 2022-11-23 DIAGNOSIS — R251 Tremor, unspecified: Secondary | ICD-10-CM | POA: Diagnosis not present

## 2022-11-23 DIAGNOSIS — Z8616 Personal history of COVID-19: Secondary | ICD-10-CM | POA: Insufficient documentation

## 2022-11-23 DIAGNOSIS — R0602 Shortness of breath: Secondary | ICD-10-CM | POA: Diagnosis not present

## 2022-11-23 DIAGNOSIS — R531 Weakness: Secondary | ICD-10-CM | POA: Diagnosis not present

## 2022-11-23 LAB — TSH: TSH: 2.163 u[IU]/mL (ref 0.350–4.500)

## 2022-11-23 LAB — COMPREHENSIVE METABOLIC PANEL
ALT: 7 U/L (ref 0–44)
AST: 20 U/L (ref 15–41)
Albumin: 3.8 g/dL (ref 3.5–5.0)
Alkaline Phosphatase: 48 U/L (ref 38–126)
Anion gap: 7 (ref 5–15)
BUN: 8 mg/dL (ref 6–20)
CO2: 27 mmol/L (ref 22–32)
Calcium: 9.4 mg/dL (ref 8.9–10.3)
Chloride: 104 mmol/L (ref 98–111)
Creatinine, Ser: 0.79 mg/dL (ref 0.44–1.00)
GFR, Estimated: >60 mL/min (ref >60)
Glucose, Bld: 119 mg/dL — ABNORMAL HIGH (ref 70–99)
Potassium: 3.9 mmol/L (ref 3.5–5.1)
Sodium: 138 mmol/L (ref 135–145)
Total Bilirubin: 0.6 mg/dL (ref 0.3–1.2)
Total Protein: 7.4 g/dL (ref 6.5–8.1)

## 2022-11-23 LAB — URINALYSIS, ROUTINE W REFLEX MICROSCOPIC
Bacteria, UA: NONE SEEN
Bilirubin Urine: NEGATIVE
Glucose, UA: NEGATIVE mg/dL
Ketones, ur: NEGATIVE mg/dL
Leukocytes,Ua: NEGATIVE
Nitrite: NEGATIVE
Protein, ur: NEGATIVE mg/dL
Specific Gravity, Urine: 1.001 — ABNORMAL LOW (ref 1.005–1.030)
pH: 7 (ref 5.0–8.0)

## 2022-11-23 LAB — CBC
HCT: 40.4 % (ref 36.0–46.0)
Hemoglobin: 13.4 g/dL (ref 12.0–15.0)
MCH: 29.1 pg (ref 26.0–34.0)
MCHC: 33.2 g/dL (ref 30.0–36.0)
MCV: 87.6 fL (ref 80.0–100.0)
Platelets: 294 10*3/uL (ref 150–400)
RBC: 4.61 MIL/uL (ref 3.87–5.11)
RDW: 12.7 % (ref 11.5–15.5)
WBC: 8 10*3/uL (ref 4.0–10.5)
nRBC: 0 % (ref 0.0–0.2)

## 2022-11-23 LAB — LIPASE, BLOOD: Lipase: 42 U/L (ref 11–51)

## 2022-11-23 LAB — POC URINE PREG, ED: Preg Test, Ur: NEGATIVE

## 2022-11-23 LAB — MAGNESIUM: Magnesium: 2.5 mg/dL — ABNORMAL HIGH (ref 1.7–2.4)

## 2022-11-23 LAB — TROPONIN I (HIGH SENSITIVITY): Troponin I (High Sensitivity): <2 ng/L (ref <18)

## 2022-11-23 NOTE — ED Provider Notes (Signed)
[facility] Provider Note    Event Date/Time   First MD Initiated Contact with Patient 11/23/22 1723     (approximate)   History   Chief Complaint Shortness of Breath, Tremors, and Abdominal Pain   HPI  Caroline [name] is a 31 y.o. female with past medical history of paroxysmal tachycardia, anxiety, and chronic fatigue syndrome who presents to the ED complaining of weakness.  Patient reports that she was diagnosed with COVID-19 towards the end of September of this year.  She states she was prescribed prednisone for her symptoms, developed hives in her feet due to this.  Hives have since resolved but she reports dealing with occasional swelling in her feet as well as difficulty with her legs giving out on her.  She states that if she stands for multiple minutes at a time, she begins to have difficulty staying upright and feels like her legs will give out.  She will occasionally feel lightheaded and dizzy, like she is going to pass out.  She also reports intermittent sharp stabbing pain in her chest with some difficulty breathing over the past month, but denies any currently.  She has been seen by cardiology for the symptoms and is currently wearing a Holter monitor.  She has also been referred to neurology for this but does not have an appointment until the end of January.  She denies any fevers, cough, dysuria, flank pain, or abdominal pain.     Physical Exam   Triage Vital Signs: ED Triage Vitals  Enc Vitals Group     BP 11/23/22 1341 (!) 147/93     Pulse Rate 11/23/22 1341 (!) 101     Resp 11/23/22 1341 18     Temp 11/23/22 1341 98.2 F (36.8 C)     Temp Source 11/23/22 1341 Oral     SpO2 11/23/22 1341 96 %     Weight 11/23/22 1343 154 lb 5.2 oz (70 kg)     Height 11/23/22 1343 '5\' 7"'$  (1.702 m)     Head Circumference --      Peak Flow --      Pain Score --      Pain Loc --      Pain Edu? --      Excl. in [place]? --     Most recent vital signs: Vitals:    11/23/22 1657 11/23/22 1918  BP: (!) 136/91 120/73  Pulse: 84 90  Resp: 16 16  Temp: 98.2 F (36.8 C) 98.2 F (36.8 C)  SpO2: 100% 100%    Constitutional: Alert and oriented. Eyes: Conjunctivae are normal. Head: Atraumatic. Nose: No congestion/rhinnorhea. Mouth/Throat: Mucous membranes are moist.  Cardiovascular: Normal rate, regular rhythm. Grossly normal heart sounds.  2+ radial pulses bilaterally. Respiratory: Normal respiratory effort.  No retractions. Lungs CTAB. Gastrointestinal: Soft and nontender. No distention. Musculoskeletal: No lower extremity tenderness nor edema.  Neurologic:  Normal speech and language. No gross focal neurologic deficits are appreciated.    ED Results / Procedures / Treatments   Labs (all labs ordered are listed, but only abnormal results are displayed) Labs Reviewed  COMPREHENSIVE METABOLIC PANEL - Abnormal; Notable for the following components:      Result Value   Glucose, Bld 119 (*)    All other components within normal limits  URINALYSIS, ROUTINE W REFLEX MICROSCOPIC - Abnormal; Notable for the following components:   Color, Urine STRAW (*)    APPearance CLEAR (*)    Specific Gravity, Urine  1.001 (*)    Hgb urine dipstick LARGE (*)    All other components within normal limits  MAGNESIUM - Abnormal; Notable for the following components:   Magnesium 2.5 (*)    All other components within normal limits  LIPASE, BLOOD  CBC  TSH  POC URINE PREG, ED  TROPONIN I (HIGH SENSITIVITY)     EKG  ED ECG REPORT I, Blake Divine, the attending physician, personally viewed and interpreted this ECG.   Date: 11/23/2022  EKG Time: 13:44  Rate: 92  Rhythm: normal sinus rhythm, sinus arrhythmia  Axis: Normal  Intervals:none  ST&T Change: None  PROCEDURES:  Critical Care performed: No  Procedures   MEDICATIONS ORDERED IN ED: Medications - No data to display   IMPRESSION / MDM / [place] / ED COURSE  I reviewed the  triage vital signs and the nursing notes.                              31 y.o. female with past medical history of paroxysmal tachycardia, chronic fatigue syndrome, and anxiety who presents to the ED with greater than 1 month of her legs giving out on her when she stands for an extended period of time along with intermittent chest pain, shortness of breath, dizziness, and lightheadedness.  Patient's presentation is most consistent with acute presentation with potential threat to life or bodily function.  Differential diagnosis includes, but is not limited to, arrhythmia, ACS, PE, pneumonia, pneumothorax, electrolyte abnormality, AKI, hypothyroidism, UTI.  Patient well-appearing and in no acute distress, vital signs are unremarkable.  She has no focal neurologic deficits on exam, is young and healthy without risk factors for stroke, describes bilateral lower extremity weakness.  She is able to ambulate here in the ED without difficulty, states it takes a couple of minutes for her legs to give out on her.  EKG shows no evidence of arrhythmia or ischemia and I have low suspicion for cardiac etiology for her symptoms.  She is currently wearing a Holter monitor, will screen troponin but with no chest pain or shortness of breath currently I, I doubt PE.  Labs thus far are reassuring with no significant anemia, leukocytosis, lecture abnormality, or AKI.  LFTs and lipase are also unremarkable.  Pregnancy testing is negative and urinalysis shows no signs of infection.  We will check TSH and magnesium level, but if these are unremarkable patient would be appropriate for outpatient follow-up with neurology in addition to cardiology.  TSH and magnesium levels are unremarkable, troponin within normal limits.  Given reassuring workup, patient is appropriate for discharge home with outpatient neurology follow-up.  Was provided referral for neurologist here in [place] in case she would be able to see them sooner.  She  was counseled to return to the ED for new or worsening symptoms, patient agrees with plan.      FINAL CLINICAL IMPRESSION(S) / ED DIAGNOSES   Final diagnoses:  Near syncope  Leg weakness, bilateral     Rx / DC Orders   ED Discharge Orders     None        Note:  This document was prepared using Dragon voice recognition software and may include unintentional dictation errors.   Blake Divine, MD 11/23/22 2004

## 2022-11-23 NOTE — ED Triage Notes (Signed)
Patient arrives by POV c/o intermittent shortness of breath, tremors- uncontrollable movements of her arms and legs, and blurred vision. Symptoms started 11/21. States she has a heart monitor in place that will come off tmw. Has echo scheduled for next month. Unable to see neurologist until February 22nd. Also has appts with GI stating she is concerned with a gut infection. Patient reports having these brown patches on her fingertips. Patient also reports intermittent hives to bottom of feet. Was started on metoprolol by cardiology as well.

## 2022-11-23 NOTE — ED Notes (Signed)
Dr Charna Archer at bedside for assessment.

## 2022-11-23 NOTE — ED Notes (Signed)
MD walking pt---gait steady and no distress noted.

## 2022-11-27 ENCOUNTER — Encounter: Payer: Self-pay | Admitting: *Deleted

## 2022-11-27 ENCOUNTER — Telehealth: Payer: Self-pay | Admitting: *Deleted

## 2022-11-27 ENCOUNTER — Encounter: Payer: Self-pay | Admitting: Family Medicine

## 2022-11-27 ENCOUNTER — Ambulatory Visit: Payer: BC Managed Care – PPO | Admitting: Family Medicine

## 2022-11-27 VITALS — BP 124/70 | HR 78 | Temp 97.2°F | Ht 67.0 in | Wt 156.2 lb

## 2022-11-27 DIAGNOSIS — U099 Post covid-19 condition, unspecified: Secondary | ICD-10-CM

## 2022-11-27 DIAGNOSIS — M546 Pain in thoracic spine: Secondary | ICD-10-CM | POA: Diagnosis not present

## 2022-11-27 DIAGNOSIS — R0602 Shortness of breath: Secondary | ICD-10-CM

## 2022-11-27 DIAGNOSIS — R7879 Finding of abnormal level of heavy metals in blood: Secondary | ICD-10-CM | POA: Diagnosis not present

## 2022-11-27 DIAGNOSIS — H539 Unspecified visual disturbance: Secondary | ICD-10-CM | POA: Diagnosis not present

## 2022-11-27 DIAGNOSIS — R5382 Chronic fatigue, unspecified: Secondary | ICD-10-CM | POA: Diagnosis not present

## 2022-11-27 DIAGNOSIS — G8929 Other chronic pain: Secondary | ICD-10-CM

## 2022-11-27 DIAGNOSIS — I951 Orthostatic hypotension: Secondary | ICD-10-CM

## 2022-11-27 DIAGNOSIS — I479 Paroxysmal tachycardia, unspecified: Secondary | ICD-10-CM

## 2022-11-27 DIAGNOSIS — R29898 Other symptoms and signs involving the musculoskeletal system: Secondary | ICD-10-CM

## 2022-11-27 LAB — COMPREHENSIVE METABOLIC PANEL
ALT: 7 IU/L (ref 0–32)
AST: 18 IU/L (ref 0–40)
Albumin/Globulin Ratio: 1.8 (ref 1.2–2.2)
Albumin: 4.4 g/dL (ref 4.0–5.0)
Alkaline Phosphatase: 62 IU/L (ref 44–121)
BUN/Creatinine Ratio: 11 (ref 9–23)
BUN: 7 mg/dL (ref 6–20)
Bilirubin Total: 0.2 mg/dL (ref 0.0–1.2)
CO2: 24 mmol/L (ref 20–29)
Calcium: 9.8 mg/dL (ref 8.7–10.2)
Chloride: 101 mmol/L (ref 96–106)
Creatinine, Ser: 0.61 mg/dL (ref 0.57–1.00)
Globulin, Total: 2.4 g/dL (ref 1.5–4.5)
Glucose: 86 mg/dL (ref 70–99)
Potassium: 4.3 mmol/L (ref 3.5–5.2)
Sodium: 140 mmol/L (ref 134–144)
Total Protein: 6.8 g/dL (ref 6.0–8.5)
eGFR: 123 mL/min/{1.73_m2} (ref >59)

## 2022-11-27 LAB — CBC WITH DIFFERENTIAL/PLATELET
Basophils Absolute: 0 10*3/uL (ref 0.0–0.2)
Basos: 0 %
EOS (ABSOLUTE): 0 10*3/uL (ref 0.0–0.4)
Eos: 1 %
Hematocrit: 41.9 % (ref 34.0–46.6)
Hemoglobin: 13.8 g/dL (ref 11.1–15.9)
Immature Grans (Abs): 0 10*3/uL (ref 0.0–0.1)
Immature Granulocytes: 0 %
Lymphocytes Absolute: 2.1 10*3/uL (ref 0.7–3.1)
Lymphs: 28 %
MCH: 28.3 pg (ref 26.6–33.0)
MCHC: 32.9 g/dL (ref 31.5–35.7)
MCV: 86 fL (ref 79–97)
Monocytes Absolute: 0.4 10*3/uL (ref 0.1–0.9)
Monocytes: 5 %
Neutrophils Absolute: 5 10*3/uL (ref 1.4–7.0)
Neutrophils: 66 %
Platelets: 283 10*3/uL (ref 150–450)
RBC: 4.87 x10E6/uL (ref 3.77–5.28)
RDW: 12.4 % (ref 11.7–15.4)
WBC: 7.6 10*3/uL (ref 3.4–10.8)

## 2022-11-27 LAB — SPECIMEN STATUS REPORT

## 2022-11-27 LAB — TSH: TSH: 2.73 u[IU]/mL (ref 0.450–4.500)

## 2022-11-27 LAB — MAGNESIUM: Magnesium: 2.1 mg/dL (ref 1.6–2.3)

## 2022-11-27 LAB — COPPER, SERUM: Copper: 257 ug/dL — ABNORMAL HIGH (ref 80–158)

## 2022-11-27 LAB — CK: Total CK: 29 U/L — ABNORMAL LOW (ref 32–182)

## 2022-11-27 LAB — ANA W/REFLEX: Anti Nuclear Antibody (ANA): NEGATIVE

## 2022-11-27 NOTE — Patient Outreach (Signed)
Care Coordination [facility] Note Transition Care Management Follow-up Telephone Call Date of discharge and from where: ED Visit 11/23/22- 11/24/22, [place], near syncope, lightheadedness, bilateral leg weakness; ED EMMI Red Alert: "No scheduled follow up" How have you been since you were released from the hospital? "I saw Dr. [name] this morning and am basically just playing a waiting game with the neurology doctors.... but I do have an initial visit scheduled with one, and am trying to call the other to see if I can get in sooner.... so far, they have not called me back.  My mom and sister are helping me with anything I need, but I am able to work because I work remotely in an Engineer, technical sales job.  Sometimes I need assistance with getting dressed, but I am usually able to do about everything on my own.  I had a question about which nasal spray I should be using with all of these medications I am taking-- I will do as you have advised and call my pharmacist for specific instructions" Any questions or concerns? No  Items Reviewed: Did the pt receive and understand the discharge instructions provided? Yes  Medications obtained and verified? Yes  Other? No  Any new allergies since your discharge? No  Dietary orders reviewed? Yes Do you have support at home? Yes - mother and sister providing assistance with care needs as indicated/ needed  Home Care and Equipment/Supplies: Were home health services ordered? no If so, what is the name of the agency? N/A  Has the agency set up a time to come to the patient's home? not applicable Were any new equipment or medical supplies ordered?  No What is the name of the medical supply agency? N/A Were you able to get the supplies/equipment? not applicable Do you have any questions related to the use of the equipment or supplies? No N/A  Functional Questionnaire: (I = Independent and D = Dependent) ADLs: I  mother and sister providing assistance with care needs as  indicated  Bathing/Dressing- I  mother and sister providing assistance with care needs as indicated  Meal Prep- I  mother and sister providing assistance with care needs as indicated  Eating- I  Maintaining continence- I  Transferring/Ambulation- I  mother and sister providing assistance with care needs as indicated  Managing Meds- I  Follow up appointments reviewed:  PCP Hospital f/u appt confirmed? Yes  Scheduled to see PCP on today 11/27/22 @ verified patient attended post-ED PCP appointment as scheduled; reviewed visit with patient Specialist Hospital f/u appt confirmed? No  Scheduled to see - on - @ - verified patient has initial neurology provider office visit scheduled at end of January 2024- verified she has alternate provider contact information and that she has initiated call to see if earlier appointment available with alternate provider Are transportation arrangements needed? No  If their condition worsens, is the pt aware to call PCP or go to the Emergency Dept.? Yes Was the patient provided with contact information for the PCP's office or ED? No- patient declined; verifies she has contact information for all providers Was to pt encouraged to call back with questions or concerns? Yes  SDOH assessments and interventions completed:   Yes SDOH Interventions Today    Flowsheet Row Most Recent Value  SDOH Interventions   Food Insecurity Interventions Intervention Not Indicated  Transportation Interventions Intervention Not Indicated  [family currently providing transportation]      Care Coordination Interventions:  Provided education around need to contact outpatient  pharmacy to have her specific questions answered about which nasal sprays she should be using in setting of antihypertensive medications    Encounter Outcome:  Pt. Visit Completed    Oneta Rack, RN, BSN, CCRN Alumnus RN CM Care Coordination/ Transition of [place] Management [phone]:  direct office

## 2022-11-27 NOTE — Progress Notes (Unsigned)
Patient ID: Caroline [name], female    DOB: [birth year], 31 y.o.   MRN: [id number]  This visit was conducted in person.  BP 124/70   Pulse 78   Temp (!) 97.2 F (36.2 C) (Temporal)   Ht '5\' 7"'$  (1.702 m)   Wt 156 lb 3.2 oz (70.9 kg)   LMP 11/23/2022   SpO2 98%   BMI 24.46 kg/m    CC: form completion  Subjective:   HPI: Caroline [name] is a 31 y.o. female presenting on 11/27/2022 for Form Completion (Needs forms completed for new job. )   See prior note for details.  Seen at ER on 11/23/2022 for presyncope and bilateral leg weakness, records reviewed. Labwork unrevealing. UA with large blood - was on her period. Upreg neg, TnI normal.   Known paroxysmal tachycardia with orthostatic intolerance - has been seeing cardiology, on metoprolol 12.'5mg'$  BID with holter monitor Zio patch in place.   Notes ongoing leg weakness with short walks as well as extremity tremors  Requests Flu shot exemption form filled out.  She is still awaiting [facility] and neurology evaluation.  She has scheduled [facility] follow up with Dr [name] for 01/15/2023. Last saw neurology 12/2020.  Symptoms have seemed to worsen since COVID infection 09/12/2022.   She notes NSAID - motrin 400-'800mg'$  significantly helps several symptoms - including foot pain/swelling leading to unsteadiness, headache improves. Tremors were better over the past 3 days however today they recurred after standing at our front counter - both hands and feet.   2 nights ago had complete R eye vision loss that lasted 30 minutes. She could see well with the lights on, but couldn't see out of the R eye when light was dimmed. L eye vision was normal throughout. After 30 min felt a pop and was able to see better but saw orange hue to right eye vision. Eyes have been dry and burning since. Last eye exam - 08/2022 [facility])     Relevant past medical, surgical, family and social history reviewed and updated as indicated. Interim medical history since our  last visit reviewed. Allergies and medications reviewed and updated. Outpatient Medications Prior to Visit  Medication Sig Dispense Refill   cetirizine (ZYRTEC) 10 MG tablet Take 10 mg by mouth daily as needed for allergies.     Cholecalciferol (VITAMIN D3) 25 MCG (1000 UT) CAPS Take 1 capsule by mouth daily.     cyanocobalamin (VITAMIN B12) 1000 MCG tablet Take 1,000 mcg by mouth daily.     diphenhydrAMINE (BENADRYL) 25 mg capsule Take 50 mg by mouth every 6 (six) hours as needed for allergies or itching.     ketoconazole (NIZORAL) 2 % shampoo apply three times per week, massage into scalp and leave in for 10 minutes before rinsing out (Patient taking differently: as needed. apply three times per week, massage into scalp and leave in for 10 minutes before rinsing out) 120 mL 2   LORazepam (ATIVAN) 0.5 MG tablet Take 0.5-1 tablets (0.25-0.5 mg total) by mouth at bedtime as needed for anxiety or sleep. 20 tablet 0   metoprolol tartrate (LOPRESSOR) 25 MG tablet Take 0.5 tablet (12.5 mg) by mouth twice daily as needed for tachycardia 30 tablet 1   mometasone (ELOCON) 0.1 % cream Apply once to twice daily to itchy rash on chest, shoulders, back until improved. Avoid face, groin, underarms. (Patient taking differently: as needed. Apply once to twice daily to itchy rash on chest, shoulders,  back until improved. Avoid face, groin, underarms.) 45 g 1   Multiple Vitamin (MULTIVITAMIN ADULT) TABS Take 1 tablet by mouth daily. Nature Made MVI for Her     pimecrolimus (ELIDEL) 1 % cream Apply topically 2 (two) times daily. For rash anywhere on the body. 30 g 2   polyethylene glycol (MIRALAX / GLYCOLAX) packet Take 17 g by mouth daily. As needed     Ruxolitinib Phosphate (OPZELURA) 1.5 % CREA Apply to affected skin once a day 60 g 2   TRI-LO-MILI 0.18/0.215/0.25 MG-25 MCG tab Take 1 tablet by mouth daily.     triamcinolone (NASACORT) 55 MCG/ACT AERO nasal inhaler Place 2 sprays into the nose daily. 1 Inhaler     Zinc 50 MG TABS Take by mouth. Takes 2 times weekly     Oxymetazoline HCl (RHOFADE) 1 % CREA Apply thin layer to face in am. (Patient not taking: Reported on 11/27/2022) 30 g 2   Prucalopride Succinate (MOTEGRITY) 2 MG TABS Take 1 tablet (2 mg total) by mouth daily. (Patient not taking: Reported on 11/27/2022) 30 tablet 2   No facility-administered medications prior to visit.     Per HPI unless specifically indicated in ROS section below Review of Systems  Objective:  BP 124/70   Pulse 78   Temp (!) 97.2 F (36.2 C) (Temporal)   Ht '5\' 7"'$  (1.702 m)   Wt 156 lb 3.2 oz (70.9 kg)   LMP 11/23/2022   SpO2 98%   BMI 24.46 kg/m   Wt Readings from Last 3 Encounters:  11/27/22 156 lb 3.2 oz (70.9 kg)  11/23/22 154 lb 5.2 oz (70 kg)  11/14/22 155 lb 6.4 oz (70.5 kg)      Physical Exam Vitals and nursing note reviewed.  Constitutional:      Appearance: Normal appearance. She is not ill-appearing.  HENT:     Head: Normocephalic and atraumatic.     Nose: Nose normal.     Mouth/Throat:     Mouth: Mucous membranes are moist.     Pharynx: Oropharynx is clear. No oropharyngeal exudate or posterior oropharyngeal erythema.  Eyes:     Extraocular Movements: Extraocular movements intact.     Pupils: Pupils are equal, round, and reactive to light.  Cardiovascular:     Rate and Rhythm: Normal rate and regular rhythm.     Pulses: Normal pulses.     Heart sounds: Normal heart sounds. No murmur heard. Pulmonary:     Effort: Pulmonary effort is normal. No respiratory distress.     Breath sounds: Normal breath sounds. No wheezing, rhonchi or rales.  Musculoskeletal:     Cervical back: Normal range of motion and neck supple.     Right lower leg: No edema.     Left lower leg: No edema.  Lymphadenopathy:     Cervical: No cervical adenopathy.  Skin:    General: Skin is warm and dry.     Capillary Refill: Capillary refill takes less than 2 seconds.  Neurological:     General: No focal deficit  present.     Mental Status: She is alert.     Cranial Nerves: Cranial nerves 2-12 are intact.     Sensory: Sensation is intact.     Motor: Motor function is intact.     Coordination: Coordination is intact.     Gait: Gait is intact.     Deep Tendon Reflexes:     Reflex Scores:      Bicep reflexes are  2+ on the right side and 2+ on the left side.      Brachioradialis reflexes are 2+ on the right side and 2+ on the left side.      Patellar reflexes are 2+ on the right side and 2+ on the left side.      Achilles reflexes are 2+ on the right side and 2+ on the left side.    Comments:  CN 2-12 intact FTN intact EOMI  5/5 strength BUE, BLE Sensation intact  Psychiatric:        Mood and Affect: Mood normal.        Behavior: Behavior normal.       Results for orders placed or performed during the hospital encounter of 11/23/22  Lipase, blood  Result Value Ref Range   Lipase 42 11 - 51 U/L  Comprehensive metabolic panel  Result Value Ref Range   Sodium 138 135 - 145 mmol/L   Potassium 3.9 3.5 - 5.1 mmol/L   Chloride 104 98 - 111 mmol/L   CO2 27 22 - 32 mmol/L   Glucose, Bld 119 (H) 70 - 99 mg/dL   BUN 8 6 - 20 mg/dL   Creatinine, Ser 0.79 0.44 - 1.00 mg/dL   Calcium 9.4 8.9 - 10.3 mg/dL   Total Protein 7.4 6.5 - 8.1 g/dL   Albumin 3.8 3.5 - 5.0 g/dL   AST 20 15 - 41 U/L   ALT 7 0 - 44 U/L   Alkaline Phosphatase 48 38 - 126 U/L   Total Bilirubin 0.6 0.3 - 1.2 mg/dL   GFR, Estimated >60 >60 mL/min   Anion gap 7 5 - 15  CBC  Result Value Ref Range   WBC 8.0 4.0 - 10.5 K/uL   RBC 4.61 3.87 - 5.11 MIL/uL   Hemoglobin 13.4 12.0 - 15.0 g/dL   HCT 40.4 36.0 - 46.0 %   MCV 87.6 80.0 - 100.0 fL   MCH 29.1 26.0 - 34.0 pg   MCHC 33.2 30.0 - 36.0 g/dL   RDW 12.7 11.5 - 15.5 %   Platelets 294 150 - 400 K/uL   nRBC 0.0 0.0 - 0.2 %  Urinalysis, Routine w reflex microscopic  Result Value Ref Range   Color, Urine STRAW (A) YELLOW   APPearance CLEAR (A) CLEAR   Specific Gravity,  Urine 1.001 (L) 1.005 - 1.030   pH 7.0 5.0 - 8.0   Glucose, UA NEGATIVE NEGATIVE mg/dL   Hgb urine dipstick LARGE (A) NEGATIVE   Bilirubin Urine NEGATIVE NEGATIVE   Ketones, ur NEGATIVE NEGATIVE mg/dL   Protein, ur NEGATIVE NEGATIVE mg/dL   Nitrite NEGATIVE NEGATIVE   Leukocytes,Ua NEGATIVE NEGATIVE   RBC / HPF 0-5 0 - 5 RBC/hpf   WBC, UA 0-5 0 - 5 WBC/hpf   Bacteria, UA NONE SEEN NONE SEEN   Squamous Epithelial / LPF 0-5 0 - 5  Magnesium  Result Value Ref Range   Magnesium 2.5 (H) 1.7 - 2.4 mg/dL  TSH  Result Value Ref Range   TSH 2.163 0.350 - 4.500 uIU/mL  POC urine preg, ED  Result Value Ref Range   Preg Test, Ur NEGATIVE NEGATIVE  Troponin I (High Sensitivity)  Result Value Ref Range   Troponin I (High Sensitivity) <2 <18 ng/L   Lab Results  Component Value Date   CKTOTAL 29 (L) 11/14/2022   Assessment & Plan:   Problem List Items Addressed This Visit       Unprioritized  Chronic fatigue   Orthostatic intolerance   Paroxysmal tachycardia ([place])    Has Zio patch in place - recording still in progress.       High blood copper level    Levels higher since she stopped zinc - advised restart zinc '50mg'$  daily. Pending [facility] eval.       Chronic back pain    Asks about return to chiropractor - discussed ok to try but if worsening symptoms to stop.       Shortness of breath    Workup to date unrevealing. She notes NSAID helps symptoms - ok to continue PRN      Vision changes - Primary    Describes sudden episode of right eye vision loss 2 nights ago that lasted about 30 minutes. She only noted symptoms in a dark room - when she turned on the lights this resolved. However she describes significant vision loss of right eye that was noticeable. I cannot explain these symptoms and recommended she have an expedited eye exam. New referral placed today.       Relevant Orders   Ambulatory referral to Ophthalmology   Leg weakness, bilateral    Bilateral subjective  weakness.  CPK levels normal.  Reassuring neurological exam.  I did recommend holding on influenza vaccine until neurology evaluation - earliest appt she could get was late 12/2022 with local neurologist.      Post covid-19 condition, unspecified    Acute worsening of symptoms post-COVID ?PASC.         No orders of the defined types were placed in this encounter.  Orders Placed This Encounter  Procedures   Ambulatory referral to Ophthalmology    Referral Priority:   Urgent    Referral Type:   Consultation    Referral Reason:   Specialty Services Required    Requested Specialty:   Ophthalmology    Number of Visits Requested:   1     Patient Instructions  We will refer you back to eye doctor for eye exam.  Ok to continue motrin '400mg'$  daily with a meal.  Restart zinc '50mg'$  daily.  Good to see you today   Follow up plan: Return in about 3 months (around 02/26/2023), or if symptoms worsen or fail to improve.  [name], MD

## 2022-11-27 NOTE — Patient Instructions (Addendum)
We will refer you back to eye doctor for eye exam.  Ok to continue motrin '400mg'$  daily with a meal.  Restart zinc '50mg'$  daily.  Good to see you today

## 2022-11-28 DIAGNOSIS — H04123 Dry eye syndrome of bilateral lacrimal glands: Secondary | ICD-10-CM | POA: Diagnosis not present

## 2022-11-28 DIAGNOSIS — Z0100 Encounter for examination of eyes and vision without abnormal findings: Secondary | ICD-10-CM | POA: Diagnosis not present

## 2022-11-28 NOTE — Assessment & Plan Note (Signed)
Bilateral subjective weakness.  CPK levels normal.  Reassuring neurological exam.  I did recommend holding on influenza vaccine until neurology evaluation - earliest appt she could get was late 12/2022 with local neurologist.

## 2022-11-28 NOTE — Assessment & Plan Note (Signed)
Acute worsening of symptoms post-COVID ?PASC.

## 2022-11-28 NOTE — Assessment & Plan Note (Signed)
Asks about return to chiropractor - discussed ok to try but if worsening symptoms to stop.

## 2022-11-28 NOTE — Assessment & Plan Note (Signed)
Has Zio patch in place - recording still in progress.

## 2022-11-28 NOTE — Assessment & Plan Note (Signed)
Describes sudden episode of right eye vision loss 2 nights ago that lasted about 30 minutes. She only noted symptoms in a dark room - when she turned on the lights this resolved. However she describes significant vision loss of right eye that was noticeable. I cannot explain these symptoms and recommended she have an expedited eye exam. New referral placed today.

## 2022-11-28 NOTE — Assessment & Plan Note (Signed)
Workup to date unrevealing. She notes NSAID helps symptoms - ok to continue PRN

## 2022-11-28 NOTE — Assessment & Plan Note (Addendum)
Levels higher since she stopped zinc - advised restart zinc '50mg'$  daily. Pending [facility] eval.

## 2022-11-30 DIAGNOSIS — I479 Paroxysmal tachycardia, unspecified: Secondary | ICD-10-CM | POA: Diagnosis not present

## 2022-12-01 ENCOUNTER — Encounter: Payer: Self-pay | Admitting: Neurology

## 2022-12-01 ENCOUNTER — Ambulatory Visit: Payer: BC Managed Care – PPO | Admitting: Neurology

## 2022-12-01 ENCOUNTER — Telehealth: Payer: Self-pay | Admitting: Neurology

## 2022-12-01 VITALS — BP 112/60 | Ht 67.0 in | Wt 156.0 lb

## 2022-12-01 DIAGNOSIS — G47419 Narcolepsy without cataplexy: Secondary | ICD-10-CM

## 2022-12-01 DIAGNOSIS — M79604 Pain in right leg: Secondary | ICD-10-CM

## 2022-12-01 DIAGNOSIS — R42 Dizziness and giddiness: Secondary | ICD-10-CM

## 2022-12-01 DIAGNOSIS — M79605 Pain in left leg: Secondary | ICD-10-CM

## 2022-12-01 DIAGNOSIS — R21 Rash and other nonspecific skin eruption: Secondary | ICD-10-CM

## 2022-12-01 DIAGNOSIS — E538 Deficiency of other specified B group vitamins: Secondary | ICD-10-CM | POA: Diagnosis not present

## 2022-12-01 DIAGNOSIS — R253 Fasciculation: Secondary | ICD-10-CM

## 2022-12-01 DIAGNOSIS — H9190 Unspecified hearing loss, unspecified ear: Secondary | ICD-10-CM

## 2022-12-01 DIAGNOSIS — R7309 Other abnormal glucose: Secondary | ICD-10-CM | POA: Diagnosis not present

## 2022-12-01 DIAGNOSIS — H04123 Dry eye syndrome of bilateral lacrimal glands: Secondary | ICD-10-CM

## 2022-12-01 DIAGNOSIS — D801 Nonfamilial hypogammaglobulinemia: Secondary | ICD-10-CM

## 2022-12-01 DIAGNOSIS — H5460 Unqualified visual loss, one eye, unspecified: Secondary | ICD-10-CM

## 2022-12-01 DIAGNOSIS — R202 Paresthesia of skin: Secondary | ICD-10-CM

## 2022-12-01 DIAGNOSIS — R61 Generalized hyperhidrosis: Secondary | ICD-10-CM

## 2022-12-01 DIAGNOSIS — M779 Enthesopathy, unspecified: Secondary | ICD-10-CM

## 2022-12-01 DIAGNOSIS — D510 Vitamin B12 deficiency anemia due to intrinsic factor deficiency: Secondary | ICD-10-CM | POA: Diagnosis not present

## 2022-12-01 DIAGNOSIS — R29898 Other symptoms and signs involving the musculoskeletal system: Secondary | ICD-10-CM

## 2022-12-01 NOTE — Telephone Encounter (Signed)
Pt is calling. Stated for insurance purpose she have to have MRI done [place] on  [address].

## 2022-12-01 NOTE — Progress Notes (Addendum)
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     Past Medical History:  Diagnosis Date   Chronic fatigue 06/26/2018   Colonic inertia 2007   Caroline [name] ([facility])   CONSTIPATION, CHRONIC 05/30/2010   H/o colonic inertia   COVID-19    COVID-19 virus infection 10/24/2022   Dysplastic nevus 11/19/2019   Left upper abdomen. Mild atypia.   High blood copper level 10/07/2021   Unclear cause.   She had home water tested with normal results.   Ceruloplasmin when checked was not low.   24 hour urine copper levels were normal.   Managing with zinc and low copper diet.   Saw heme - ?GI issue, planned GI f/u.    History of chicken pox    Hives 08/16/2021   Urticaria to OxyCide cleaner   Influenza B 11/19/2018   Orthostatic intolerance 06/20/2019   Paroxysmal tachycardia (HCC) 05/12/2021   S/p cardiac eval ([facility] Martinique, Caroline Rockey Situ)   Seasonal allergic rhinitis    White matter abnormality on MRI of brain 01/31/2016   Small T2/FLAIR hyperintense focus in the deep white matter of the right frontal lobe, unchanged in appearance when compared to the 12/14/2015 MRI. This is a nonspecific finding and is unlikely to be of clinical significance in isolation. 34m (11/2016)    Past Surgical History:  Procedure Laterality Date   COLONOSCOPY  2010   WNL ([name]   EXCISION MORTON'S NEUROMA Left 2007   WISDOM TOOTH  EXTRACTION  2010    Current Outpatient Medications  Medication Sig Dispense Refill   cetirizine (ZYRTEC) 10 MG tablet Take 10  mg by mouth daily as needed for allergies.     Cholecalciferol (VITAMIN D3) 25 MCG (1000 UT) CAPS Take 1 capsule by mouth daily.     cyanocobalamin (VITAMIN B12) 1000 MCG tablet Take 1,000 mcg by mouth 2 (two) times daily.     diphenhydrAMINE (BENADRYL) 25 mg capsule Take 50 mg by mouth every 6 (six) hours as needed for allergies or itching.     fluticasone (FLONASE) 50 MCG/ACT nasal spray Place into both nostrils daily.     ketoconazole (NIZORAL) 2 % shampoo apply three times per week, massage into scalp and leave in for 10 minutes before rinsing out (Patient taking differently: as needed. apply three times per week, massage into scalp and leave in for 10 minutes before rinsing out) 120 mL 2   LORazepam (ATIVAN) 0.5 MG tablet Take 0.5-1 tablets (0.25-0.5 mg total) by mouth at bedtime as needed for anxiety or sleep. 20 tablet 0   metoprolol tartrate (LOPRESSOR) 25 MG tablet Take 0.5 tablet (12.5 mg) by mouth twice daily as needed for tachycardia 30 tablet 1   mometasone (ELOCON) 0.1 % cream Apply once to twice daily to itchy rash on chest, shoulders, back until improved. Avoid face, groin, underarms. (Patient taking differently: as needed. Apply once to twice daily to itchy rash on chest, shoulders, back until improved. Avoid face, groin, underarms.) 45 g 1   Multiple Vitamin (MULTIVITAMIN ADULT) TABS Take 1 tablet by mouth daily. Nature Made MVI for Her     neomycin-polymyxin-dexamethasone (MAXITROL) 0.1 % ophthalmic suspension 1 drop 4 (four) times daily.     Oxymetazoline HCl 1 % CREA Apply topically.     pimecrolimus (ELIDEL) 1 % cream Apply topically 2 (two) times daily. For rash anywhere on the body. 30 g 2   polyethylene glycol (MIRALAX / GLYCOLAX) packet Take 17 g by mouth daily. As needed     Ruxolitinib Phosphate (OPZELURA) 1.5 % CREA Apply to affected skin once a  day 60 g 2   TRI-LO-MILI 0.18/0.215/0.25 MG-25 MCG tab Take 1 tablet by mouth daily.     triamcinolone (NASACORT) 55 MCG/ACT AERO nasal inhaler Place 2 sprays into the nose daily. 1 Inhaler    Zinc 50 MG TABS Take by mouth. Takes 2 times weekly     No current facility-administered medications for this visit.    Allergies as of 12/01/2022 - Review Complete 12/01/2022  Allergen Reaction Noted   Amoxicillin Other (See Comments) 02/18/2020   Doxycycline Nausea And Vomiting 12/08/2015   Linzess [linaclotide] Other (See Comments) 05/17/2017   Neomycin  06/23/2022   Nickel Itching 08/30/2022   Other Rash 05/29/2022    Vitals: BP 112/60   Ht _0  (1.702 m)   Wt 156 lb (70.8 kg)   LMP 11/23/2022   BMI 24.43 kg/m  Last Weight:  Wt Readings from Last 1 Encounters:  12/01/22 156 lb (70.8 kg)   Last Height:   Ht Readings from Last 1 Encounters:  12/01/22 _1  (1.702 m)  Physical exam: Exam: Gen: NAD, conversant, well nourised, obese, well groomed                     CV: RRR, no MRG. No Carotid Bruits. No peripheral edema, warm, nontender Eyes: Conjunctivae clear without exudates or hemorrhage  Neuro: Detailed Neurologic Exam  Speech:    Speech is normal; fluent and spontaneous with normal comprehension.  Cognition:    The patient is oriented to person, place, and time;  recent and remote memory intact;     language fluent;     normal attention, concentration,     fund of knowledge Cranial Nerves:    The pupils are equal, round, and reactive to light. The fundi are normal and spontaneous venous pulsations are present. Visual fields are full to finger confrontation. Extraocular movements are intact. Trigeminal sensation is intact and the muscles of mastication are normal. The face is symmetric. The palate elevates in the midline. Hearing intact. Voice is normal. Shoulder shrug is normal. The tongue has normal motion without fasciculations.   Coordination:    Normal finger  to nose and heel to shin. Normal rapid alternating movements.   Gait:    Heel-toe and tandem gait are normal.   Motor Observation:    No asymmetry, no atrophy, and no involuntary movements noted. Tone:    Normal muscle tone.    Posture:    Posture is normal. normal erect    Strength:    Strength is V/V in the upper and lower limbs.      Sensation: intact to LT, romber neg     Reflex Exam:  DTR's:    Deep tendon reflexes in the upper and lower extremities are normal bilaterally.   Toes:    The toes are downgoing bilaterally.   Clonus:    2 beats clonus   Assessment/Plan:   Very nice patient that we saw in the past for what sounded like  vaso vagal vs orthostatic hypotension (feels dizzy, hot, vision changes, then feels weak, sitting and putting head in knees helps). Today she returns with a plethora of symptoms and has seen multiple specialists including rheumatology, cardiology, gastroenterology, hematology, ophthalmology, allergist, pending [facility] neurology; I'm not quite sure what else to do for patient but she reports vision problems, tachycardia, orthostatic hypotension, dizziness, monocular vision loss, B12 deficiency, copper abnormalities, shortness of breath, rashes, swelling in her hands and bottom of the feet, lesions on the fingers, dry eyes and dry skin, dysautonomia, leg pain and leg weakness, urinary frequency, numbness and tingling in the head, hearing issues with tinnitus, presyncope, abdominal pain, neck pain, dizziness, twitching, excessive daytime somnolence.  She has had extensive imaging and lab testing.  As we spoke there were few other labs that I thought of we could try but she has had such extensive labwork I can't think of anything else to test her for and she has had multiple MRIs of the rest of the neuro axis through the years.  We can repeat MRI of the brain given her new symptoms especially the concerning ones of new acute vision loss:   MRI brain due to  concerning symptoms as above  to look for space occupying mass, chiari or intracranial hypertension (pseudotumor), strokes, malignancies, vasculidities, demyelination(multiple sclerosis) or other   Orders Placed This Encounter  Procedures   MR BRAIN W WO CONTRAST   Intrinsic Factor Antibodies   Anti-parietal antibody   RPR   Sjogren's syndrome antibods(ssa + ssb)   Autoimmune Neurology Ab   Hemoglobin A1c   NARCOLEPSY EVALUATION   Sedimentation rate   C-reactive protein   IgG, IgA, IgM   HIV antibody (with reflex)   NARCOLEPSY EVALUATION   Hemoglobin A1c   Anti-parietal antibody   Intrinsic Factor Antibodies   HIV Antibody (routine testing w rflx)   Sedimentation rate   C-reactive protein   Sarina Ill, MD  [facility] [address] [place], [place] [address]  Phone [phone] Fax  [phone]  I spent over 90 minutes of face-to-face and non-face-to-face time with patient on the  1. Dizziness   2. B12 deficiency   3. Pernicious anemia   4. Rash of both feet   5. Dry eyes   6. Night sweats   7. Elevated glucose   8. Sleep attack   9. Inflammation around joint   10. Monocular vision loss   11. Hearing loss, unspecified hearing loss type, unspecified laterality   12. Weakness of both lower extremities   13. Paresthesias   14. Pain in both lower extremities   15. Muscle twitch   16. Hypogammaglobulinemia ([place])    diagnosis.  This included previsit chart review, lab review, study review, order entry, electronic health record documentation, patient education on the different diagnostic and therapeutic options, counseling and coordination of care, risks and benefits of management, compliance, or risk factor reduction

## 2022-12-01 NOTE — Patient Instructions (Addendum)
MRI brain and orbits w/wo contrast Tiro MRI lumbar spine  Blood work   Orders Placed This Encounter  Procedures   Intrinsic Factor Antibodies   Anti-parietal antibody   RPR   Sjogren's syndrome antibods(ssa + ssb)   Autoimmune Neurology Ab   Hemoglobin A1c   NARCOLEPSY EVALUATION   Sedimentation rate   C-reactive protein   We will call for next steps based on results

## 2022-12-04 ENCOUNTER — Encounter: Payer: Self-pay | Admitting: Neurology

## 2022-12-07 ENCOUNTER — Telehealth: Payer: Self-pay | Admitting: Cardiovascular Disease

## 2022-12-07 ENCOUNTER — Ambulatory Visit: Payer: BC Managed Care – PPO | Attending: Cardiovascular Disease

## 2022-12-07 DIAGNOSIS — I479 Paroxysmal tachycardia, unspecified: Secondary | ICD-10-CM | POA: Diagnosis not present

## 2022-12-07 LAB — ECHOCARDIOGRAM COMPLETE
AR max vel: 2.68 cm2
AV Area VTI: 2.49 cm2
AV Area mean vel: 2.25 cm2
AV Mean grad: 3 mmHg
AV Peak grad: 3.7 mmHg
Ao pk vel: 0.97 m/s
Area-P 1/2: 3.93 cm2
S' Lateral: 2.5 cm
Single Plane A4C EF: 74.8 %

## 2022-12-07 MED ORDER — METOPROLOL TARTRATE 25 MG PO TABS: ORAL_TABLET | ORAL | 1 refills | Status: DC

## 2022-12-07 NOTE — Telephone Encounter (Signed)
*  STAT* If patient is at the pharmacy, call can be transferred to refill team.   1. Which medications need to be refilled? (please list name of each medication and dose if known)   metoprolol tartrate (LOPRESSOR) 25 MG tablet    2. Which pharmacy/location (including street and city if local pharmacy) is medication to be sent to?  [place], [place]    3. Do they need a 30 day or 90 day supply? [place]

## 2022-12-07 NOTE — Telephone Encounter (Signed)
Pt checking on status of MRI.

## 2022-12-08 LAB — AUTOIMMUNE NEUROLOGY AB

## 2022-12-08 LAB — HEMOGLOBIN A1C
Est. average glucose Bld gHb Est-mCnc: 103 mg/dL
Hgb A1c MFr Bld: 5.2 % (ref 4.8–5.6)

## 2022-12-08 LAB — INTRINSIC FACTOR ANTIBODIES: Intrinsic Factor Abs, Serum: 0.9 AU/mL (ref 0.0–1.1)

## 2022-12-08 LAB — SJOGREN'S SYNDROME ANTIBODS(SSA + SSB)
ENA SSA (RO) Ab: <0.2 AI (ref 0.0–0.9)
ENA SSB (LA) Ab: <0.2 AI (ref 0.0–0.9)

## 2022-12-08 LAB — HIV ANTIBODY (ROUTINE TESTING W REFLEX): HIV Screen 4th Generation wRfx: NONREACTIVE

## 2022-12-08 LAB — NARCOLEPSY EVALUATION
DQA1*01:02: NEGATIVE
DQB1*06:02: NEGATIVE

## 2022-12-08 LAB — SEDIMENTATION RATE: Sed Rate: 7 mm/hr (ref 0–32)

## 2022-12-08 LAB — C-REACTIVE PROTEIN: CRP: 7 mg/L (ref 0–10)

## 2022-12-08 LAB — RPR: RPR Ser Ql: NONREACTIVE

## 2022-12-08 LAB — ANTI-PARIETAL ANTIBODY: Parietal Cell Ab: 1.8 Units (ref 0.0–20.0)

## 2022-12-12 ENCOUNTER — Ambulatory Visit: Payer: BC Managed Care – PPO | Admitting: Internal Medicine

## 2022-12-15 ENCOUNTER — Ambulatory Visit: Payer: BC Managed Care – PPO | Admitting: Internal Medicine

## 2022-12-15 ENCOUNTER — Ambulatory Visit (INDEPENDENT_AMBULATORY_CARE_PROVIDER_SITE_OTHER)
Admission: RE | Admit: 2022-12-15 | Discharge: 2022-12-15 | Disposition: A | Discharge status: Home / Self Care | Payer: BC Managed Care – PPO | Source: Ambulatory Visit | Attending: Internal Medicine | Admitting: Internal Medicine

## 2022-12-15 ENCOUNTER — Encounter: Payer: Self-pay | Admitting: Internal Medicine

## 2022-12-15 VITALS — BP 98/62 | HR 102 | Ht 67.0 in | Wt 158.0 lb

## 2022-12-15 DIAGNOSIS — R109 Unspecified abdominal pain: Secondary | ICD-10-CM | POA: Diagnosis not present

## 2022-12-15 DIAGNOSIS — K5901 Slow transit constipation: Secondary | ICD-10-CM

## 2022-12-15 DIAGNOSIS — K599 Functional intestinal disorder, unspecified: Secondary | ICD-10-CM | POA: Diagnosis not present

## 2022-12-15 DIAGNOSIS — R790 Abnormal level of blood mineral: Secondary | ICD-10-CM

## 2022-12-15 NOTE — Patient Instructions (Signed)
Please go to the x-ray department in the basement today.   Due to recent changes in healthcare laws, you may see the results of your imaging and laboratory studies on MyChart before your provider has had a chance to review them.  We understand that in some cases there may be results that are confusing or concerning to you. Not all laboratory results come back in the same time frame and the provider may be waiting for multiple results in order to interpret others.  Please give [place] 48 hours in order for your provider to thoroughly review all the results before contacting the office for clarification of your results.    I appreciate the opportunity to care for you. Silvano Rusk, MD

## 2022-12-15 NOTE — Progress Notes (Signed)
Caroline [name] 31 y.o. 06-08-1991 [id number]  Assessment & Plan:   Encounter Diagnoses  Name Primary?   Colonic inertia Yes   Slow transit constipation    Abnormal blood level of copper    These are occurring in the background of a multitude of neurologic and cardiac symptoms without structural or metabolic or laboratory abnormalities to explain except for perhaps the ophthalmologic exam.  I do not have an explanation for all of these problems, and with respect to the gut she has had colonic inertia.  This can be neuropathic.  She had not tried [employer].  I had her do a 2 view abdomen and there was slightly increased stool.  I have advised a MiraLAX purge and then for her to try to [place] to help with her motility.  I still think a colonoscopy would be low yield.  We may need to do one and given the multitude of symptoms she has probably in the [name] setting as opposed to our ambulatory endoscopy center attached to the office.  Would also consider a SIBO test though would wait several weeks after the MiraLAX purge.  Most importantly I would like to see what her MRI of the brain shows [place] first as I have asked her to let me know when she completes that.  Tertiary evaluation would be helpful, it seems.  I have explained that it takes a long time to get into tertiary centers.  I have recommended she consider the [name].  I have no idea why her copper level was elevated.  Please see my previous note for comments about that.  To the best of my knowledge that is not clinically relevant at this point.  CC: Caroline Bush, MD    Subjective:   Chief Complaint: Follow-up of constipation and colonic inertia  HPI 31 year old white woman with a history of colonic inertia, diagnosed [name] years ago and previously followed by [name] and I had seen her in 2022 and then more recently earlier this year because of an elevated copper level.  She has a history of chronic  fatigue, disequilibrium, iron deficiency and allergic rhinitis.  She had a colonoscopy that was unrevealing in 2010 she has a family history of colon cancer but she is not really yet due for a colonoscopy related to that.  When last seen it was my understanding she had referrals to [name] neurology and GI regarding the elevated copper level and other issues and she says she is still waiting to hear.  She was prescribed Motegrity and we did have some discussion about possible SIBO testing.  She has not taken the [place].  She has had worsening of constipation and is having to take multiple doses of MiraLAX intermittently she may go up to 5 days without defecation.  She is also describing frequent nausea, palpitations and tachycardia, dry heaves and sometimes presyncope.  She is waiting on an MRI of the brain ordered by neurology because of a prior history of pineal cyst and abnormal white matter of unclear significance and all of her symptoms.  Sometimes she will have very urgent defecation and little warning though I do not think she is having significant incontinence.  She feels like "I have inflammation all over my body".  Serologic inflammatory markers have been negative.  Review of systems positive for those things mentioned above, she had a transient loss of vision in the right eye recently and is now on eyedrops for inflammation but is  on her third different treatment because of intolerance to the drops.  An echocardiogram was performed by [name] and was okay.  Sometimes her blood pressure is low she thinks related to medications for tachycardia.  Though she has had good issues and problems before everything has gotten worse since she had COVID.  Labs ordered by [name] from neurology recently with normal CRP sed rate HIV, intrinsic factor and antiparietal antibodies hemoglobin A1c RPR, Sjogren's syndrome antibodies, autoimmune neurology antibody panel, narcolepsy HLA testing, all normal.  HPI from  10/03/2022 visit 31 year old white woman with a history of colonic inertia diagnosed at [name], last seen by me July 12, 2021 and she was doing well.  Previously seen by [name] in this practice.  She also has a history of chronic fatigue, disequilibrium iron deficiency and allergic rhinitis.   When I saw her last year she had a reported that she was managing her colonic inertia well with MiraLAX.  In the interim she has been having problems with cramps and bloating and irregular bowel habits and sometimes urgent defecation which requires her to run to the bathroom and she has had some incontinence.  She is working from home so this is tolerated but her quality of life is poor and not acceptable with this.  She reports having had Linzess treatment in the past but it was not helpful.  Bowel habit pattern these days is cramps pain without defecation and then 1 day during the week and cycle she will have multiple loose stools she will be okay for a few days and then the cycle repeats.  It may be worse during menses.  She has an ovarian cyst and some breakthrough bleeding so recently had her oral contraceptive dose increased.  There is no rectal bleeding.  She uses MiraLAX 2-3 times a week as taking it daily seems to cause too much nausea and diarrhea.   In 2020 she had some sort of insect bite and has developed heart rate variability palpitations and presyncopal episodes and there has been a question of POTS.   In October 2022 due to some neuropathic symptoms she had B1 and copper levels checked and her copper returned at 245.  There was no known excess copper ingestion and no KF rings seen on eye exam and she did eventually see an ophthalmologist that she apparently has some sort of "spots" but no report of KF rings.  A ceruloplasmin was high at 52.6 and her urinary copper excretion was normal on a 24-hour study in November 2022.  Zinc supplementation was started in an effort to reduce copper levels.  The  levels of copper in her water were negative.  There is no family history of copper toxicity syndromes.  A recheck of copper level showed it still to be high at 228 in July 2023.  Zinc level was normal.  88.       She saw oncology 07/19/2022 regarding the elevated copper levels.  Etiology was not clear and they recommended she return to GI.   She saw allergy of [name] August 30, 2022 for evaluation of a slightly low IgA level.  She has previously been tested for celiac disease and is negative and alpha gal test was negative as well.  Immunoglobulins all normal except IgE was low.  Serum tryptase normal.  Leukotriene E4 24-hour urine normal.  ANA negative.    Allergies  Allergen Reactions   Amoxicillin Other (See Comments)    Does not  tolerate larger doses of amoxicillin, tolerates 59m TID well   Doxycycline Nausea And Vomiting   Linzess [Linaclotide] Other (See Comments)    Bloating, abd pain   Neomycin     Tested weakly positive by patch test   Nickel Itching   Other Rash    Oxycide - cleaner used at [name]. Caused rash all over.    Current Meds  Medication Sig   cetirizine (ZYRTEC) 10 MG tablet Take 10 mg by mouth daily as needed for allergies.   Cholecalciferol (VITAMIN D3) 25 MCG (1000 UT) CAPS Take 1 capsule by mouth daily.   cyanocobalamin (VITAMIN B12) 1000 MCG tablet Take 1,000 mcg by mouth 2 (two) times daily.   diphenhydrAMINE (BENADRYL) 25 mg capsule Take 50 mg by mouth every 6 (six) hours as needed for allergies or itching.   fluorometholone (FML) 0.1 % ophthalmic suspension Place 1 drop into both eyes 2 (two) times daily.   fluticasone (FLONASE) 50 MCG/ACT nasal spray Place into both nostrils daily.   ketoconazole (NIZORAL) 2 % shampoo apply three times per week, massage into scalp and leave in for 10 minutes before rinsing out (Patient taking differently: as needed. apply three times per week, massage into scalp and leave in for 10 minutes before rinsing out)    LORazepam (ATIVAN) 0.5 MG tablet Take 0.5-1 tablets (0.25-0.5 mg total) by mouth at bedtime as needed for anxiety or sleep.   metoprolol tartrate (LOPRESSOR) 25 MG tablet Take 0.5 tablet (12.5 mg) by mouth twice daily as needed for tachycardia   mometasone (ELOCON) 0.1 % cream Apply once to twice daily to itchy rash on chest, shoulders, back until improved. Avoid face, groin, underarms. (Patient taking differently: as needed. Apply once to twice daily to itchy rash on chest, shoulders, back until improved. Avoid face, groin, underarms.)   Multiple Vitamin (MULTIVITAMIN ADULT) TABS Take 1 tablet by mouth daily. Nature Made MVI for Her   neomycin-polymyxin-dexamethasone (MAXITROL) 0.1 % ophthalmic suspension 1 drop 4 (four) times daily.   Oxymetazoline HCl 1 % CREA Apply topically.   pimecrolimus (ELIDEL) 1 % cream Apply topically 2 (two) times daily. For rash anywhere on the body.   polyethylene glycol (MIRALAX / GLYCOLAX) packet Take 17 g by mouth daily. As needed   Ruxolitinib Phosphate (OPZELURA) 1.5 % CREA Apply to affected skin once a day   TRI-LO-MILI 0.18/0.215/0.25 MG-25 MCG tab Take 1 tablet by mouth daily.   triamcinolone (NASACORT) 55 MCG/ACT AERO nasal inhaler Place 2 sprays into the nose daily.   Zinc 50 MG TABS Take by mouth. Takes 2 times weekly   Past Medical History:  Diagnosis Date   Chronic fatigue 06/26/2018   Colonic inertia 2007   [name]   CONSTIPATION, CHRONIC 05/30/2010   H/o colonic inertia   COVID-19    COVID-19 virus infection 10/24/2022   Dysplastic nevus 11/19/2019   Left upper abdomen. Mild atypia.   High blood copper level 10/07/2021   Unclear cause.   She had home water tested with normal results.   Ceruloplasmin when checked was not low.   24 hour urine copper levels were normal.   Managing with zinc and low copper diet.   Saw heme - ?GI issue, planned GI f/u.    History of chicken pox    Hives 08/16/2021   Urticaria to OxyCide cleaner    Influenza B 11/19/2018   Orthostatic intolerance 06/20/2019   Paroxysmal tachycardia (HCC) 05/12/2021   S/p cardiac eval (Dr  Martinique, Dr Rockey [name])   Seasonal allergic rhinitis    White matter abnormality on MRI of brain 01/31/2016   Small T2/FLAIR hyperintense focus in the deep white matter of the right frontal lobe, unchanged in appearance when compared to the 12/14/2015 MRI. This is a nonspecific finding and is unlikely to be of clinical significance in isolation. 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   Edu: bScientist, clinical (histocompatibility and immunogenetics)and [employer] visitor  Activity: Walking   Diet: good water, fruits/vegetables daily      family history includes Atrial fibrillation in her father; COPD in her father; Cancer in her father and paternal grandmother; Cirrhosis in her father; Colon cancer in her father and paternal aunt; Dementia in her maternal grandmother; Diabetes in her father; Heart failure in her maternal grandmother; Hypertension in her father, maternal grandmother, mother, and paternal grandmother; Hypothyroidism in her mother; Liver disease in her father; Stroke in her maternal grandmother.   Review of Systems As per HPI  Objective:   Physical Exam BP 98/62   Pulse (!) 102   Ht _0  (1.702 m)   Wt 158 lb (71.7 kg)   LMP 11/23/2022   BMI 24.75 kg/m  WDWN NAD Abd soft, nontender and nondistended

## 2022-12-22 DIAGNOSIS — R61 Generalized hyperhidrosis: Secondary | ICD-10-CM | POA: Diagnosis not present

## 2022-12-26 DIAGNOSIS — M5451 Vertebrogenic low back pain: Secondary | ICD-10-CM | POA: Diagnosis not present

## 2022-12-26 DIAGNOSIS — M5413 Radiculopathy, cervicothoracic region: Secondary | ICD-10-CM | POA: Diagnosis not present

## 2022-12-26 DIAGNOSIS — M9903 Segmental and somatic dysfunction of lumbar region: Secondary | ICD-10-CM | POA: Diagnosis not present

## 2022-12-26 DIAGNOSIS — M9901 Segmental and somatic dysfunction of cervical region: Secondary | ICD-10-CM | POA: Diagnosis not present

## 2022-12-27 ENCOUNTER — Telehealth: Payer: Self-pay | Admitting: *Deleted

## 2022-12-27 NOTE — Telephone Encounter (Signed)
Please advise patient that her recent brain MRI did not show any acute findings and overall stable findings from her prior brain MRI from nearly 2 years ago.

## 2022-12-27 NOTE — Telephone Encounter (Signed)
Received MRI brain report from [facility]. Dr [name] had ordered MRI brain. See screenshot of report below. Sent to Dr Rexene Alberts to review for Dr [name] while she is out of the office.

## 2022-12-28 ENCOUNTER — Ambulatory Visit: Payer: BC Managed Care – PPO | Admitting: Dermatology

## 2022-12-28 ENCOUNTER — Encounter: Payer: Self-pay | Admitting: Internal Medicine

## 2023-01-15 ENCOUNTER — Institutional Professional Consult (permissible substitution): Payer: BC Managed Care – PPO | Admitting: Neurology

## 2023-01-20 NOTE — Progress Notes (Unsigned)
Cardiology Office Note  Date:  01/22/2023   ID:  Caroline [name], DOB April 24, 1991, MRN [id number]  PCP:  [name], MD   Chief Complaint  Patient presents with   3 month follow up     Patient c/o tachycardia and dizziness. Medications reviewed by the patient verbally.     HPI:  Caroline [name] is a 32 y.o. female with past medical history of Colonic inertia Chronic fatigue Dizziness with difficulty with balance and gait, heat intolerance and fatigability.  remote history of seizures with her last in 2007  Who presents for follow-up of her tachycardia, weakness  Last seen by myself November 2023 Seen by one of our providers October 2023 positive COVID test in September 2023, completed short course of steroids.   Reports having chronic leg weakness, Unable to drive as she feels disoriented Shortness of breath on exertion, stamina is poor, gives out  Works in [place], [employer]  On prior visit we started metoprolol to tartrate 12.5 twice daily as needed for tachycardia  On today's visit reports her symptoms have persisted, she is interested in PT for leg weakness, gait training Has a treadmill in her basement, does not use it  Echocardiogram December 07, 2022 Normal study, normal ejection fraction  Zio monitor with no significant arrhythmia, normal sinus rhythm Triggered events associated with normal rhythm  Seen by neurology, normal head MRI Seen by GI, being treated for colonic inertia Started on notegrity per GI, reports having side effects  EKG personally reviewed by myself on todays visit Normal sinus rhythm rate 84 bpm no significant ST-T wave changes  Orthostatics performed today were negative Supine 117/78 heart rate 73  Sitting 119/81 heart rate 83 Standing 132/80 heart rate 98 Standing 3 minutes 120/79 heart rate 107  Prior cardiac studies reviewed echo Doppler study from December 03, 2019 which showed EF of 60 to 65% with normal wall motion.      PMH:   has a past medical history of Chronic fatigue (06/26/2018), Colonic inertia (2007), CONSTIPATION, CHRONIC (05/30/2010), COVID-19, COVID-19 virus infection (10/24/2022), Dysplastic nevus (11/19/2019), High blood copper level (10/07/2021), History of chicken pox, Hives (08/16/2021), Influenza B (11/19/2018), Orthostatic intolerance (06/20/2019), Paroxysmal tachycardia ([place]) (05/12/2021), Seasonal allergic rhinitis, and White matter abnormality on MRI of brain (01/31/2016).  PSH:    Past Surgical History:  Procedure Laterality Date   COLONOSCOPY  2010   WNL [name])   EXCISION MORTON'S NEUROMA Left 2007   WISDOM TOOTH EXTRACTION  2010    Current Outpatient Medications  Medication Sig Dispense Refill   cetirizine (ZYRTEC) 10 MG tablet Take 10 mg by mouth daily as needed for allergies.     Cholecalciferol (VITAMIN D3) 25 MCG (1000 UT) CAPS Take 1 capsule by mouth daily.     cyanocobalamin (VITAMIN B12) 1000 MCG tablet Take 1,000 mcg by mouth 2 (two) times daily.     diphenhydrAMINE (BENADRYL) 25 mg capsule Take 50 mg by mouth every 6 (six) hours as needed for allergies or itching.     fluorometholone (FML) 0.1 % ophthalmic suspension Place 1 drop into both eyes 2 (two) times daily.     fluticasone (FLONASE) 50 MCG/ACT nasal spray Place into both nostrils daily.     ketoconazole (NIZORAL) 2 % shampoo apply three times per week, massage into scalp and leave in for 10 minutes before rinsing out (Patient taking differently: as needed. apply three times per week, massage into scalp and leave in for 10 minutes before rinsing  out) 120 mL 2   LORazepam (ATIVAN) 0.5 MG tablet Take 0.5-1 tablets (0.25-0.5 mg total) by mouth at bedtime as needed for anxiety or sleep. 20 tablet 0   metoprolol tartrate (LOPRESSOR) 25 MG tablet Take 0.5 tablet (12.5 mg) by mouth twice daily as needed for tachycardia 90 tablet 1   mometasone (ELOCON) 0.1 % cream Apply once to twice daily to itchy rash on chest,  shoulders, back until improved. Avoid face, groin, underarms. (Patient taking differently: as needed. Apply once to twice daily to itchy rash on chest, shoulders, back until improved. Avoid face, groin, underarms.) 45 g 1   Multiple Vitamin (MULTIVITAMIN ADULT) TABS Take 1 tablet by mouth daily. Nature Made MVI for Her     neomycin-polymyxin-dexamethasone (MAXITROL) 0.1 % ophthalmic suspension 1 drop 4 (four) times daily.     Oxymetazoline HCl 1 % CREA Apply topically.     pimecrolimus (ELIDEL) 1 % cream Apply topically 2 (two) times daily. For rash anywhere on the body. 30 g 2   polyethylene glycol (MIRALAX / GLYCOLAX) packet Take 17 g by mouth daily. As needed     Prucalopride Succinate (MOTEGRITY) 2 MG TABS Take 2 mg by mouth daily.     Ruxolitinib Phosphate (OPZELURA) 1.5 % CREA Apply to affected skin once a day 60 g 2   TRI-LO-MILI 0.18/0.215/0.25 MG-25 MCG tab Take 1 tablet by mouth daily.     triamcinolone (NASACORT) 55 MCG/ACT AERO nasal inhaler Place 2 sprays into the nose daily. 1 Inhaler    Zinc 50 MG TABS Take by mouth. Takes 2 times weekly     No current facility-administered medications for this visit.    Allergies:   Amoxicillin, Doxycycline, Linzess [linaclotide], Neomycin, Nickel, and Other   Social History:  The patient  reports that she has never smoked. She has never used smokeless tobacco. She reports current alcohol use. She reports that she does not use drugs.   Family History:   family history includes Atrial fibrillation in her father; COPD in her father; Cancer in her father and paternal grandmother; Cirrhosis in her father; Colon cancer in her father and paternal aunt; Dementia in her maternal grandmother; Diabetes in her father; Heart failure in her maternal grandmother; Hypertension in her father, maternal grandmother, mother, and paternal grandmother; Hypothyroidism in her mother; Liver disease in her father; Stroke in her maternal grandmother.    Review of  Systems: Review of Systems  Constitutional:  Positive for malaise/fatigue.  HENT: Negative.    Respiratory:  Positive for shortness of breath.   Cardiovascular:  Positive for palpitations.       Tachycardia   Gastrointestinal: Negative.   Musculoskeletal: Negative.        Leg weakness  Neurological: Negative.   Psychiatric/Behavioral: Negative.    All other systems reviewed and are negative.    PHYSICAL EXAM: VS:  BP 110/78 (BP Location: Right Arm, Patient Position: Sitting, Cuff Size: Normal)   Pulse 84   Ht '5\' 7"'$  (1.702 m)   Wt 161 lb (73 kg)   SpO2 98%   BMI 25.22 kg/m  , BMI Body mass index is 25.22 kg/m. Constitutional:  oriented to person, place, and time. No distress.  HENT:  Head: Grossly normal Eyes:  no discharge. No scleral icterus.  Neck:  no carotid bruits  Cardiovascular: Regular rate and rhythm, no murmurs appreciated Pulmonary/Chest: Clear to auscultation bilaterally, no wheezes or rails Abdominal: Soft.  no distension.   Musculoskeletal: Normal range of motion Neurological:  normal muscle tone. Coordination normal. No atrophy Skin: Skin warm and dry Psychiatric: normal affect, pleasant   Recent Labs: 11/23/2022: ALT 7; BUN 8; Creatinine, Ser 0.79; Hemoglobin 13.4; Magnesium 2.5; Platelets 294; Potassium 3.9; Sodium 138; TSH 2.163    Lipid Panel Lab Results  Component Value Date   CHOL 189 06/29/2022   HDL 51 06/29/2022   LDLCALC 84 06/29/2022   TRIG 331 (H) 06/29/2022      Wt Readings from Last 3 Encounters:  01/22/23 161 lb (73 kg)  12/15/22 158 lb (71.7 kg)  12/01/22 156 lb (70.8 kg)     ASSESSMENT AND PLAN:  Problem List Items Addressed This Visit       Cardiology Problems   Paroxysmal tachycardia ([place]) - Primary     Other   Post covid-19 condition, unspecified  Sinus tachycardia Long history of sinus tachycardia,  Likely exacerbated end of 2023 with COVID repeat Zio monitor with no significant arrhythmia apart from sinus  tachycardia Triggered events on Zio monitor associated with normal sinus rhythm Normal echocardiogram She would prefer to take metoprolol tartrate as needed Reports symptoms worse overnight No further cardiac testing needed  Leg weakness, gait instability She is requesting physical therapy Reports order previously placed for physical therapy but she was not able to completed Continued issues with ambulating secondary to feeling she is going to give out Order provided, she would like to do this at a facility called [facility] near where she lives  Colonic inertia Followed by GI reports having side effects on new medication   Total encounter time more than 30 minutes  Greater than 50% was spent in counseling and coordination of care with the patient     Signed, Esmond Plants, M.D., Ph.D. [place], [place]

## 2023-01-22 ENCOUNTER — Ambulatory Visit: Payer: BC Managed Care – PPO | Attending: Cardiovascular Disease | Admitting: Cardiovascular Disease

## 2023-01-22 ENCOUNTER — Encounter: Payer: Self-pay | Admitting: Cardiovascular Disease

## 2023-01-22 VITALS — BP 110/78 | HR 84 | Ht 67.0 in | Wt 161.0 lb

## 2023-01-22 DIAGNOSIS — U099 Post covid-19 condition, unspecified: Secondary | ICD-10-CM | POA: Diagnosis not present

## 2023-01-22 DIAGNOSIS — I479 Paroxysmal tachycardia, unspecified: Secondary | ICD-10-CM | POA: Diagnosis not present

## 2023-01-22 DIAGNOSIS — R29898 Other symptoms and signs involving the musculoskeletal system: Secondary | ICD-10-CM

## 2023-01-22 MED ORDER — METOPROLOL TARTRATE 25 MG PO TABS: ORAL_TABLET | ORAL | 3 refills | Status: DC

## 2023-01-22 NOTE — Patient Instructions (Addendum)
PT order for gait training, leg weakness    Medication Instructions:  Your physician recommends that you continue on your current medications as directed. Please refer to the Current Medication list given to you today.  Metoprolol as needed for tachycardia  If you need a refill on your cardiac medications before your next appointment, please call your pharmacy.   Lab work: No new labs needed  Testing/Procedures: No new testing needed  Follow-Up: At [facility] [place], you and your health needs are our priority.  As part of our continuing mission to provide you with exceptional heart care, we have created designated Provider Care Teams.  These Care Teams include your primary Cardiologist (physician) and Advanced Practice Providers (APPs -  Physician Assistants and Nurse Practitioners) who all work together to provide you with the care you need, when you need it.  You will need a follow up appointment as needed  Providers on your designated Care Team:   Murray Hodgkins, NP Christell Faith, PA-C Cadence Kathlen Mody, [place]  COVID-19 Vaccine Information can be found at: [email or web address] For questions related to vaccine distribution or appointments, please email [email or web address] or call [phone].

## 2023-01-25 DIAGNOSIS — R0609 Other forms of dyspnea: Secondary | ICD-10-CM | POA: Diagnosis not present

## 2023-01-26 ENCOUNTER — Telehealth: Payer: Self-pay | Admitting: Cardiovascular Disease

## 2023-01-26 NOTE — Telephone Encounter (Signed)
Returned the call to the patient. She stated that she recently saw her Allergist that has recommended she reach out to her cardiologist due to increased fatigue, weakness and shortness of breath when exerting herself. Her allergist feels like she could benefit from having a tilt table test to evaluate for POTS. The patient also inquired about getting a stress test completed.

## 2023-01-26 NOTE — Telephone Encounter (Signed)
Patient's allergy dr says that she needs a Tilt Table test dont. She was told she need to get in contact with our office to have it done. Please advise

## 2023-01-29 NOTE — Telephone Encounter (Signed)
Spoke with patient and informed her of Dr. Donivan Scull recommendations Would recommend she establish at tertiary center such as [facility] or [facility] if she is interested in tilt table as they are not completed at [facility].Symptoms most likely not related to heart rate variability or POTS. That would not explain leg weakness, lack of stamina. Stress testing also would be low yield, walking on a treadmill would not give [place] additional useful information apart from confirm lack of conditioning. Would recommend she work with PT on conditioning, use treadmill daily at home [employer]

## 2023-01-30 NOTE — Addendum Note (Signed)
Addended by: Michel Santee on: 01/30/2023 10:37 AM   Modules accepted: Orders

## 2023-02-06 ENCOUNTER — Ambulatory Visit: Payer: BC Managed Care – PPO | Admitting: Family Medicine

## 2023-02-08 DIAGNOSIS — H04123 Dry eye syndrome of bilateral lacrimal glands: Secondary | ICD-10-CM | POA: Diagnosis not present

## 2023-02-12 ENCOUNTER — Ambulatory Visit: Payer: BC Managed Care – PPO | Admitting: Family Medicine

## 2023-02-12 ENCOUNTER — Encounter: Payer: Self-pay | Admitting: Family Medicine

## 2023-02-12 VITALS — BP 122/84 | HR 79 | Temp 97.7°F | Ht 67.0 in | Wt 165.0 lb

## 2023-02-12 DIAGNOSIS — Z888 Allergy status to other drugs, medicaments and biological substances status: Secondary | ICD-10-CM | POA: Diagnosis not present

## 2023-02-12 DIAGNOSIS — K5909 Other constipation: Secondary | ICD-10-CM

## 2023-02-12 DIAGNOSIS — R635 Abnormal weight gain: Secondary | ICD-10-CM

## 2023-02-12 DIAGNOSIS — J019 Acute sinusitis, unspecified: Secondary | ICD-10-CM

## 2023-02-12 DIAGNOSIS — K599 Functional intestinal disorder, unspecified: Secondary | ICD-10-CM | POA: Diagnosis not present

## 2023-02-12 DIAGNOSIS — R350 Frequency of micturition: Secondary | ICD-10-CM | POA: Diagnosis not present

## 2023-02-12 DIAGNOSIS — I479 Paroxysmal tachycardia, unspecified: Secondary | ICD-10-CM

## 2023-02-12 DIAGNOSIS — R7879 Finding of abnormal level of heavy metals in blood: Secondary | ICD-10-CM

## 2023-02-12 DIAGNOSIS — H539 Unspecified visual disturbance: Secondary | ICD-10-CM

## 2023-02-12 DIAGNOSIS — I951 Orthostatic hypotension: Secondary | ICD-10-CM

## 2023-02-12 DIAGNOSIS — U099 Post covid-19 condition, unspecified: Secondary | ICD-10-CM

## 2023-02-12 LAB — POC URINALSYSI DIPSTICK (AUTOMATED)
Bilirubin, UA: NEGATIVE
Blood, UA: NEGATIVE
Glucose, UA: NEGATIVE
Ketones, UA: NEGATIVE
Leukocytes, UA: NEGATIVE
Nitrite, UA: NEGATIVE
Protein, UA: NEGATIVE
Spec Grav, UA: <=1.005 — AB (ref 1.010–1.025)
Urobilinogen, UA: 0.2 E.U./dL
pH, UA: 6.5 (ref 5.0–8.0)

## 2023-02-12 NOTE — Progress Notes (Unsigned)
Patient ID: Caroline [name], female    DOB: October 23, 1991, 32 y.o.   MRN: [id number]  This visit was conducted in person.  BP 122/84   Pulse 79   Temp 97.7 F (36.5 C) (Temporal)   Ht 5' 7"$  (1.702 m)   Wt 165 lb (74.8 kg)   LMP 01/18/2023   SpO2 97%   BMI 25.84 kg/m    CC: f/u long COVID syndrome  Subjective:   HPI: Caroline [name] is a 32 y.o. female presenting on 02/12/2023 for Medical Management of Chronic Issues (Here for long Covid f/u. Also, c/o head congestion, cough, nasal drainage, teeth pain and ear pain. Sxs started 02/07/23. Neg home Covid tests [2x- 2/17/, 2/18]. )   5d h/o head congestion, mild cough, PNDrainage, R>L tooth and ear pain. Initial hoarseness getting better. She notes symptoms are improving.  Negative COVID tests at home x2.  Mother was sick last weekend.  Treating with motrin.   Known paroxysmal tachycardia with orthostatic intolerance - has been seeing cardiology, on metoprolol 12.86m BID with holter monitor Zio patch in place.   New job at [employer]- she loves it!   Symptoms worse since COVID 09/12/2022.  NSAID helps symptoms.   Saw [facility] Dr AJaynee Eagles12/2023 - MR brain and orbits reassuring.   Saw [facility] Dr GCarlean Purl12/2023 - s/p miralax purge (did very well with this) then started Motegrity - she was having regular daily bowel movements, but sometimes too frequent and it significantly increased appetite, possible UTI she treated with macrobid. Notes Pending [facility] eval for chronically high blood copper levels.   Saw cardiology Dr GRockey Situ1/2024 - parox tachycardia exacerbated by COVID end of 2023 on metoprolol, zio patch unrevealing, echocardiogram normal.  Has been able to drop metoprolol to PRN.   Using ativan 0.548m1/2 tab PRN with benefit.   Saw ophthalmology - tear issues - was prescribed Tyrvaya by ophthalmologist but ended up not trying - insurance issues. Was suggested desensitization therapy with car driving.   Weight gain noted - using new birth  control Tri-low-mili.      Relevant past medical, surgical, family and social history reviewed and updated as indicated. Interim medical history since our last visit reviewed. Allergies and medications reviewed and updated. Outpatient Medications Prior to Visit  Medication Sig Dispense Refill   cetirizine (ZYRTEC) 10 MG tablet Take 10 mg by mouth daily as needed for allergies.     Cholecalciferol (VITAMIN D3) 25 MCG (1000 UT) CAPS Take 1 capsule by mouth daily.     cyanocobalamin (VITAMIN B12) 1000 MCG tablet Take 1,000 mcg by mouth 2 (two) times daily.     diphenhydrAMINE (BENADRYL) 25 mg capsule Take 50 mg by mouth every 6 (six) hours as needed for allergies or itching.     fluorometholone (FML) 0.1 % ophthalmic suspension Place 1 drop into both eyes 2 (two) times daily.     fluticasone (FLONASE) 50 MCG/ACT nasal spray Place into both nostrils daily.     ketoconazole (NIZORAL) 2 % shampoo apply three times per week, massage into scalp and leave in for 10 minutes before rinsing out (Patient taking differently: as needed. apply three times per week, massage into scalp and leave in for 10 minutes before rinsing out) 120 mL 2   LORazepam (ATIVAN) 0.5 MG tablet Take 0.5-1 tablets (0.25-0.5 mg total) by mouth at bedtime as needed for anxiety or sleep. 20 tablet 0   metoprolol tartrate (LOPRESSOR) 25 MG tablet Take  0.5 tablet (12.5 mg) by mouth twice daily as needed for tachycardia 60 tablet 3   mometasone (ELOCON) 0.1 % cream Apply once to twice daily to itchy rash on chest, shoulders, back until improved. Avoid face, groin, underarms. (Patient taking differently: as needed. Apply once to twice daily to itchy rash on chest, shoulders, back until improved. Avoid face, groin, underarms.) 45 g 1   Multiple Vitamin (MULTIVITAMIN ADULT) TABS Take 1 tablet by mouth daily. Nature Made MVI for Her     neomycin-polymyxin-dexamethasone (MAXITROL) 0.1 % ophthalmic suspension 1 drop 4 (four) times daily.      Oxymetazoline HCl 1 % CREA Apply topically.     pimecrolimus (ELIDEL) 1 % cream Apply topically 2 (two) times daily. For rash anywhere on the body. 30 g 2   polyethylene glycol (MIRALAX / GLYCOLAX) packet Take 17 g by mouth daily. As needed     Ruxolitinib Phosphate (OPZELURA) 1.5 % CREA Apply to affected skin once a day 60 g 2   TRI-LO-MILI 0.18/0.215/0.25 MG-25 MCG tab Take 1 tablet by mouth daily.     triamcinolone (NASACORT) 55 MCG/ACT AERO nasal inhaler Place 2 sprays into the nose daily. 1 Inhaler    Zinc 50 MG TABS Take by mouth. Takes 2 times weekly     Prucalopride Succinate (MOTEGRITY) 2 MG TABS Take 2 mg by mouth daily. (Patient not taking: Reported on 02/12/2023)     No facility-administered medications prior to visit.     Per HPI unless specifically indicated in ROS section below Review of Systems  Objective:  BP 122/84   Pulse 79   Temp 97.7 F (36.5 C) (Temporal)   Ht 5' 7"$  (1.702 m)   Wt 165 lb (74.8 kg)   LMP 01/18/2023   SpO2 97%   BMI 25.84 kg/m   Wt Readings from Last 3 Encounters:  02/12/23 165 lb (74.8 kg)  01/22/23 161 lb (73 kg)  12/15/22 158 lb (71.7 kg)      Physical Exam Vitals and nursing note reviewed.  Constitutional:      Appearance: Normal appearance. She is not ill-appearing.  HENT:     Head: Normocephalic and atraumatic.     Right Ear: Tympanic membrane, ear canal and external ear normal. There is no impacted cerumen.     Left Ear: Tympanic membrane, ear canal and external ear normal. There is no impacted cerumen.     Nose: Mucosal edema and congestion present. No rhinorrhea.     Right Turbinates: Not enlarged, swollen or pale.     Left Turbinates: Not enlarged, swollen or pale.     Right Sinus: No maxillary sinus tenderness or frontal sinus tenderness.     Left Sinus: No maxillary sinus tenderness or frontal sinus tenderness.     Comments: Raw nasal mucosa bilaterally    Mouth/Throat:     Mouth: Mucous membranes are moist.     Pharynx:  Oropharynx is clear. No oropharyngeal exudate or posterior oropharyngeal erythema.  Eyes:     Extraocular Movements: Extraocular movements intact.     Conjunctiva/sclera: Conjunctivae normal.     Pupils: Pupils are equal, round, and reactive to light.  Cardiovascular:     Rate and Rhythm: Normal rate and regular rhythm.     Pulses: Normal pulses.     Heart sounds: Normal heart sounds. No murmur heard. Pulmonary:     Effort: Pulmonary effort is normal. No respiratory distress.     Breath sounds: Normal breath sounds. No wheezing, rhonchi  or rales.  Lymphadenopathy:     Head:     Right side of head: No submental, submandibular, tonsillar, preauricular or posterior auricular adenopathy.     Left side of head: No submental, submandibular, tonsillar, preauricular or posterior auricular adenopathy.     Cervical: No cervical adenopathy.     Right cervical: No superficial cervical adenopathy.    Left cervical: No superficial cervical adenopathy.     Upper Body:     Right upper body: No supraclavicular adenopathy.     Left upper body: No supraclavicular adenopathy.  Skin:    Findings: No rash.  Neurological:     Mental Status: She is alert.  Psychiatric:        Mood and Affect: Mood normal.        Behavior: Behavior normal.       Results for orders placed or performed in visit on 12/07/22  ECHOCARDIOGRAM COMPLETE  Result Value Ref Range   AR max vel 2.68 cm2   AV Peak grad 3.7 mmHg   Ao pk vel 0.97 m/s   S' Lateral 2.50 cm   Area-P 1/2 3.93 cm2   AV Area VTI 2.49 cm2   AV Mean grad 3.0 mmHg   Single Plane A4C EF 74.8 %   AV Area mean vel 2.25 cm2    Assessment & Plan:   Problem List Items Addressed This Visit     Acute sinusitis    Ongoing over the past 5 days - anticipate viral sinusitis.  Supportive measures reviewed including continued nasal saline irrigation, nasal steroid, add guaifenesin.  Update if ongoing symptoms past 7-10 days to Rx antibiotic course to cover  bacterial sinusitis.       Other Visit Diagnoses     Urinary frequency    -  Primary   Relevant Orders   POCT Urinalysis Dipstick (Automated)        No orders of the defined types were placed in this encounter.   Orders Placed This Encounter  Procedures   POCT Urinalysis Dipstick (Automated)    Patient Instructions  Urinalysis today.  Continue supportive care as up to now.  You have a sinus infection, likely viral. Push fluids and plenty of rest. Nasal saline irrigation or neti pot to help drain sinuses. May use plain mucinex (fast relief guaifenesin) with plenty of fluid to help mobilize mucous. Please let us know if fever >101.5, trouble opening/closing mouth, difficulty swallowing, or worsening instead of improving as expected.  Let us know if symptoms ongoing past 7-10 days for antibiotic course.   Follow up plan: Return if symptoms worsen or fail to improve.  [name], MD

## 2023-02-12 NOTE — Assessment & Plan Note (Signed)
Ongoing over the past 5 days - anticipate viral sinusitis.  Supportive measures reviewed including continued nasal saline irrigation, nasal steroid, add guaifenesin.  Update if ongoing symptoms past 7-10 days to Rx antibiotic course to cover bacterial sinusitis.

## 2023-02-12 NOTE — Patient Instructions (Addendum)
Urinalysis today.  Continue supportive care as up to now.  You have a sinus infection, likely viral. Push fluids and plenty of rest. Nasal saline irrigation or neti pot to help drain sinuses. May use plain mucinex (fast relief guaifenesin) with plenty of fluid to help mobilize mucous. Please let us know if fever >101.5, trouble opening/closing mouth, difficulty swallowing, or worsening instead of improving as expected.  Let us know if symptoms ongoing past 7-10 days for antibiotic course.

## 2023-02-13 DIAGNOSIS — R635 Abnormal weight gain: Secondary | ICD-10-CM | POA: Insufficient documentation

## 2023-02-13 NOTE — Assessment & Plan Note (Signed)
Cardiology didn't think this was consistent with POTS.

## 2023-02-13 NOTE — Assessment & Plan Note (Addendum)
She notes ~15lb weight gain over the past 6 months in setting of decreased activity due to multiple issues. As she's started feeling better she has slowly increased activity and is up to 2500 steps/day. Will continue to monitor. She also wonders about effect of new OCP Tri-lo-mili - will f/u with OBGYN.

## 2023-02-13 NOTE — Assessment & Plan Note (Signed)
Now on Motegrity with PRN miralax - wonders if motegrity may have contributed to UTI.

## 2023-02-13 NOTE — Assessment & Plan Note (Signed)
Ongoing post-COVID fatigue and shortness of breath.

## 2023-02-13 NOTE — Assessment & Plan Note (Signed)
Now sees [facility] allergist.

## 2023-02-13 NOTE — Assessment & Plan Note (Signed)
Followed by ophthalmology.   

## 2023-02-13 NOTE — Assessment & Plan Note (Addendum)
Recent Zio patch and echocardiogram results reassuring.  Has been able to change metoprolol to PRN.

## 2023-02-13 NOTE — Assessment & Plan Note (Addendum)
Did well with miralax purge, now on Motegrity with benefit - will f/u with GI. Pending [facility] eval.

## 2023-02-13 NOTE — Assessment & Plan Note (Signed)
Unrevealing evaluation Pending [facility] eval

## 2023-02-15 ENCOUNTER — Encounter: Payer: Self-pay | Admitting: Internal Medicine

## 2023-02-22 DIAGNOSIS — N921 Excessive and frequent menstruation with irregular cycle: Secondary | ICD-10-CM | POA: Diagnosis not present

## 2023-02-23 ENCOUNTER — Other Ambulatory Visit: Payer: Self-pay | Admitting: Internal Medicine

## 2023-02-26 ENCOUNTER — Other Ambulatory Visit: Payer: Self-pay | Admitting: Internal Medicine

## 2023-02-26 MED ORDER — MOTEGRITY 1 MG PO TABS: 1.0000 mg | ORAL_TABLET | Freq: Every day | ORAL | 0 refills | Status: DC

## 2023-03-05 ENCOUNTER — Ambulatory Visit: Payer: BC Managed Care – PPO | Admitting: Neurology

## 2023-03-05 ENCOUNTER — Telehealth: Payer: Self-pay | Admitting: Neurology

## 2023-03-05 ENCOUNTER — Encounter: Payer: Self-pay | Admitting: Neurology

## 2023-03-05 VITALS — BP 123/67 | HR 93 | Ht 67.0 in | Wt 164.0 lb

## 2023-03-05 DIAGNOSIS — R202 Paresthesia of skin: Secondary | ICD-10-CM

## 2023-03-05 DIAGNOSIS — C8599 Non-Hodgkin lymphoma, unspecified, extranodal and solid organ sites: Secondary | ICD-10-CM

## 2023-03-05 DIAGNOSIS — R21 Rash and other nonspecific skin eruption: Secondary | ICD-10-CM

## 2023-03-05 DIAGNOSIS — E530 Riboflavin deficiency: Secondary | ICD-10-CM

## 2023-03-05 DIAGNOSIS — E531 Pyridoxine deficiency: Secondary | ICD-10-CM

## 2023-03-05 DIAGNOSIS — K909 Intestinal malabsorption, unspecified: Secondary | ICD-10-CM

## 2023-03-05 DIAGNOSIS — E519 Thiamine deficiency, unspecified: Secondary | ICD-10-CM

## 2023-03-05 DIAGNOSIS — E559 Vitamin D deficiency, unspecified: Secondary | ICD-10-CM

## 2023-03-05 DIAGNOSIS — E52 Niacin deficiency [pellagra]: Secondary | ICD-10-CM

## 2023-03-05 DIAGNOSIS — D509 Iron deficiency anemia, unspecified: Secondary | ICD-10-CM

## 2023-03-05 DIAGNOSIS — G901 Familial dysautonomia [Riley-Day]: Secondary | ICD-10-CM | POA: Diagnosis not present

## 2023-03-05 DIAGNOSIS — Z800 Family history of malignant neoplasm of digestive organs: Secondary | ICD-10-CM

## 2023-03-05 DIAGNOSIS — H547 Unspecified visual loss: Secondary | ICD-10-CM

## 2023-03-05 DIAGNOSIS — D759 Disease of blood and blood-forming organs, unspecified: Secondary | ICD-10-CM

## 2023-03-05 DIAGNOSIS — M79609 Pain in unspecified limb: Secondary | ICD-10-CM

## 2023-03-05 DIAGNOSIS — E538 Deficiency of other specified B group vitamins: Secondary | ICD-10-CM | POA: Diagnosis not present

## 2023-03-05 DIAGNOSIS — R531 Weakness: Secondary | ICD-10-CM

## 2023-03-05 NOTE — Progress Notes (Signed)
[facility]    Provider:  Dr [name] Referring Provider: [name], MD Primary Care Physician:  [name], MD  CC: New request for lightheadedness, multiple neurologic and other symptoms  03/05/2023;  Very nice patient that we saw in the past for what sounded like  vaso vagal vs orthostatic hypotension (feels dizzy, hot, vision changes, then feels weak, sitting and putting head in knees helps). Today she returns with a plethora of symptoms and has seen multiple specialists including rheumatology, cardiology, gastroenterology, hematology, ophthalmology, allergist, pending [facility]; I'm not quite sure what else to do for patient but she [name] vision problems, tachycardia, orthostatic hypotension, dizziness, monocular vision loss, B12 deficiency, copper abnormalities, shortness of breath, rashes, swelling in her hands and bottom of the feet, lesions on the fingers, dry eyes and dry skin, dysautonomia, leg pain and leg weakness, urinary frequency, numbness and tingling in the head, hearing issues with tinnitus, presyncope, abdominal pain, neck pain, dizziness, twitching, excessive daytime somnolence.  She has had extensive imaging and lab testing.  As we spoke there were few other labs that I thought of we could try but she has had such extensive labwork I can't think of anything else to test her for and she has had multiple MRIs of the rest of the neuro axis through the years.  We can repeat MRI of the brain given her new symptoms especially the concerning ones of new acute vision loss:   Her asthma and allergist thinks she has dysautonomoia and POTS and recommended a tilt table. She is going to [facility] but sounds like she needs [facility]  12/22/2022: 1.  No acute intracranial pathology identified.  2.  Signal foci of high T2/FLAIR signal abnormality within the right corona radiata unchanged from prior.  Electronically Signed by: Ritta Slot, MD on 12/26/2022 4:17 PM Narrative  This result has an attachment that is not available. MRI brain without and with contrast:  INDICATION:  Night sweats  TECHNIQUE: Imaging of the brain was performed in 3 planes utilizing a combination of T1, FLAIR, and T2 weighting. Diffusion images were performed. In addition, sagittal and axial T1-weighted sequences were performed after intravenous injection of 7 mL Gadavist.  COMPARISON: MRI January 27, 2021  FINDINGS: #  Brain parenchyma: There is single foci of high T2/FLAIR signal abnormality within the right corona radiata unchanged from prior.  #   Diffusion-weighted images are normal indicating no acute infarct.  #   No evidence mass or hemorrhage.  No abnormal enhancement. #   #  Ventricles: Normal without dilatation, mass effect, or midline shift.  #  Sella and parasellar region: Normal. #  Craniovertebral junction: Normal.  #  Mastoids and paranasal sinuses: Clear. #  Orbits: Normal  #  Major cerebral vessels and dural sinuses: Patent. #  Bony structures and scalp: Unremarkable. Procedure Note  Kerby Nora, DO - 12/26/2022 Formatting of this note might be different from the original. MRI brain without and with contrast:  INDICATION:  Night sweats  TECHNIQUE: Imaging of the brain was performed in 3 planes utilizing a combination of T1, FLAIR, and T2 weighting. Diffusion images were performed. In addition, sagittal and axial T1-weighted sequences were performed after intravenous injection of 7 mL Gadavist.  COMPARISON: MRI January 27, 2021  FINDINGS: #  Brain parenchyma: There is single foci of high T2/FLAIR signal abnormality within the right corona radiata unchanged from prior.  #   Diffusion-weighted images are normal indicating no  acute infarct.  #   No evidence mass or hemorrhage.  No abnormal enhancement. #   #  Ventricles: Normal without dilatation, mass effect, or midline shift.  #  Sella and parasellar  region: Normal. #  Craniovertebral junction: Normal.  #  Mastoids and paranasal sinuses: Clear. #  Orbits: Normal  #  Major cerebral vessels and dural sinuses: Patent. #  Bony structures and scalp: Unremarkable.   IMPRESSION:  1.  No acute intracranial pathology identified.  2.  Signal foci of high T2/FLAIR signal abnormality within the right corona radiata unchanged from prior.   Patient complains of symptoms per HPI as well as the following symptoms: risges on her nails . Pertinent negatives and positives per HPI. All others negative   12/01/2022:Had covid in September and she cannot drive because everything she does her vision randomly goes out, her heart rate is through the roof, she has orthostatic hypotension. she is in a workup with cardiology with heart monitor and echo, she is very dizzy, saw eye doctor and macular degeneration at her age (she said it is starting), she has ophthalmology. [facility] [name], she lost vision in one eye over the weekend right eye completely black no vision whatsoever 30 minutes later she saw an orange tint and woke up with resolution of symptoms at the time and color was affected. Her copper was elevated but she has had some testing for Wilson's disease. B12 has been as low as 222 and she took supplements.  Below is a list of concerns:  July she saw hematologist for her metabolic blood abnormalities like low ferritin, copper, b12  She saw gastroenterology for copper level and absorption issues Worsened with covid, in September, once she started testing negative she was getting SOB and couldn't breath she got an inhaler, she was given prednisone and started having problems in her feet with swelling on the bottom and rashes She saw an allergy specialist for multiple reactions like rash on the bottom of the feet.  Her hands are puffy and she has rashes on the bottom of her feet and maybe developing lesions on a finger. She has dry skin  and one finger gets white and has lesions, dry eyes She has a referral to [facility] and gastro, is waiting for appointments She did well on the prednisone symptoms improved and her heart symptoms improved,  But she had side effects to the prednisone possibly the rash on the feet or unsure if it was the covid Oct 6 - 12 prednisone treatment Had hives on the 14th on her feet benadryl helped with the rash on the bottom of the feet(she showed me pictures, generalized redness and swelling with some elevated nodules then became lacy) She has dysautonomia her heart rate was up to 145 and labile - she was started on metoprolol and she is seeing cardiology; She has labile blood pressure with orthostatic hypotension She has lots of bad pain and legs want to give out, urinary frequency, numbness and tingling in the head (points to the parietal area), sinus congestion, night sweats, Ear ache hearing loss with tinnitus, Presyncope, Belly atacks, pain lower right abdomen, dry heaving, Puffy feet and rash, Hands have issues too Always has neck pain Had multiple mris stable Had mri cervical spine at [facility] unremarkable [facility]: MRI head w/wo 01/2021, c-spine, mri head 02/2020 and 11/2016 also 2016 had mri c spine and l spine Dizziness, floaters, vision randomly just "leaves" Feels terrible driving she doen't  feel safe to drive.she has twitching in her toes.  Exhaustion Dry lips She has a facial rash (she was told it is rosacea) Drinking enough  No seizure like activity Episodes of having to go to sleep immediately  Patient complains of symptoms per HPI as well as the following symptoms: none that are not covered above . Pertinent negatives and positives per HPI. All others negative    HPI January 05, 2021: Patient is here as a new request for evaluation.  She has been seen in the past for multiple neurologic complaints including dizziness with no neurologic etiology, seen by ENT and multiple doctors for  her dizziness and other neurologic/somatic issues.  MRI in the past showed stable incidental pineal cyst and 1 stable incidental T2 hyperintensity.  In the past lightheadedness/dizziness sounded more orthostatic in nature.  I reviewed Dr. Bosie Clos notes, patient continues to have ongoing lightheadedness, she remains worried about incidental finding of pineal cyst, now with increasing insomnia requested return to [place] for recheck.  I reviewed labs TSH and B12 normal.  She was advised to try melatonin, advised on sleep hygiene for her insomnia, benzodiazepine sparingly.  She developed insomnia in August. She also developed tremors (none visualized today), she developed twitches in the hands and legs, taking vitamins helps, more "tremors" in the feet which she describes more as twitching and "swayey" when she stands. She denies any inciting factors. She has tried to decrease her workload. When she tries to go to sleep she has fear and anxiety for absolutely no  Reason and all of a sudden, she has turn the TV on and calm herself down and then it is fine, she can't figure out a trigger and it is odd, she wears a fit bit at night, sleep quality is poor her fit bit says she wakes up all the time. She is a very restless sleeper. She doesn't want a sleep study now. She has these episodes episodically, this week she had it twice, she has episodes of vision changes, problems with gait. Father is in the hospital he has multiple medical conditions.   Recent labs: B12 472, vitamin D 29.7, folate nml, tsh nml, sed rate  MRI brain 12/08/2016: IMPRESSION:  This MRI of the brain with and without contrast shows the following: 1.   Small T2/FLAIR hyperintense focus in the deep white matter of the right frontal lobe, unchanged in appearance when compared to the 12/14/2015 MRI. This is a nonspecific finding and is unlikely to be of clinical significance in isolation. 2.    8-9 mm benign-appearing pineal cyst, unchanged when  compared to the 2016 MRI.  Personally reviewed imaging and agree with the above  hief Complaint Patient presents with Other Had covid for a 2nd time on sept 19th - was having trouble breathing - went to her pcp - not her pcp Was put on prednisone on 10/6. Finished with the prednisone Very shaky - at times feels like she is going to have syncopal episode - Her feet (bottoms) turned red and hives Did see eye doctor - has spots - and was told she had macular degeneration Saw allergy-had testing done - has not gotten the results yet - had to go back on antihistamines Ankles have been terrible Heart rate is racing also recently changes birth control so doesn't know what if That has anything to do with some of these symptoms  History of present illness:  Caroline [name] is a 32 y.o.female who presents today for follow  up of copper overload. She was last seen 08/15/2022 and was doing somewhat poorly with eye and sinus issues and general atopy but without evidence of rheumatologic autoimmune disease. We obtained labs to rule out ANCA vasculitis or sjogren's syndrome but recommended GI evaluation for copper overload.  Today She is doing worse than previously following infection with COVID in September, following which she was given prednisone which seemed to provoke hives on the soles of her feet. The infection worsened her GI symptoms, caused increased presyncopal episodes and disequilibrium, and increased joint pains including but not limited to her MCPs.  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 She is a never smoker. She drinks an average of 0 servings of alcohol weekly. Caroline [name] a family history of osteoarthritis in her grandmother, thyroid disease on her mother's side but denies any family history of known autoimmune disease otherwise. She is utilizing OCPs for contraception.  Review of Systems ROS was negative except as noted above.  Patient Active  Problem List Diagnosis Date Noted Vitamin D deficiency 03/19/2020 Dizziness of unknown cause 02/18/2020 Chronic fatigue 02/18/2020 Vitamin D deficiency 02/18/2020 Disequilibrium 02/18/2020 History of vitamin D deficiency 04/20/2016  Past Medical History: Diagnosis Date Acute otitis media Allergic rhinitis Colonic inertia Vitamin D deficiency disease  Past Surgical History: Procedure Laterality Date left foot neuroma removal wisdom teeth extraction  Social History  Socioeconomic History Marital status: Single Tobacco Use Smoking status: Never Smokeless tobacco: Never Vaping Use Vaping Use: Never used Substance and Sexual Activity Alcohol use: Yes Comment: rarely Drug use: No Sexual activity: Never Social History Narrative works at [employer] (remote). She lives in [place] with her parents. She does not have kids. Occasional alcohol use, she has never smoked.  Family History Problem Relation Age of Onset Thyroid disease Mother Abnormal EKG Mother High blood pressure (Hypertension) Father High blood pressure (Hypertension) Maternal Grandmother Alzheimer's disease Maternal Grandmother Thyroid disease Maternal Grandmother Coronary Artery Disease (Blocked arteries around heart) Maternal Grandmother Abnormal EKG Maternal Grandmother Arthritis Maternal Grandmother Coronary Artery Disease (Blocked arteries around heart) Maternal Grandfather Coronary Artery Disease (Blocked arteries around heart) Paternal Grandmother Colon cancer Paternal Grandmother rare colon cancer Coronary Artery Disease (Blocked arteries around heart) Paternal Grandfather  Physical Exam: BP 108/70 (BP Location: Left upper arm, Patient Position: Sitting, BP Cuff Size: Small Adult)  Temp 36.9 C (98.5 F) (Oral)  Wt 69.9 kg (154 lb)  BMI 24.11 kg/m General: Pleasant white woman sitting in chair. Well groomed, no acute distress. Non-toxic appearance. HEENT: Conjunctivae normal. Pulmonary:  Normal effort of breathing. Symmetric chest expansion. Musculoskeletal: Tenderness to palpation of MCPs. No synovitis. Fist formation unimpaired bilaterally. Grip strength appropriate and equal. Negative squeeze tests to hands and feet. No other tender, synovitic, warm, erythematous, or deformed joints. FROM all joints except as above. Skin: Skin warm and dry. No rashes appreciable. Neurologic: Oriented to time, person, place, and situation. Normal gait. Psychological: Normal behavior, thought content, and judgment  Records were reviewed. Office Visit on 08/30/2022 Component Date Value Ref Range Status IgG 08/30/2022 913 588 - 1,573 mg/dL Final IgM 08/30/2022 91 57 - 237 mg/dL Final IgA 08/30/2022 55 46 - 287 mg/dL Final IgE 08/30/2022 <5 4 - 269 IU/mL Final GalactoseAlpha1,3GalactoseCapE - V* 08/30/2022 <0.10 <0.10 kU/L Final [facility] Test Results 09/28/2022 See Comments Final Tryptase 08/30/2022 4.3 <11.5 ng/mL Final Anti-Nuclear Antibody Screen 08/30/2022 Negative Negative Final Orders Only on 08/15/2022 Component Date Value Ref Range Status White Blood Cell Count - [facility]  08/15/2022 7.5 3.4 - 10.8 x10E3/uL Final Red Blood Cell Count - [facility] 08/15/2022 4.46 3.77 - 5.28 x10E6/uL Final Hemoglobin - [facility] 08/15/2022 12.8 11.1 - 15.9 g/dL Final Hematocrit - [facility] 08/15/2022 38.9 34.0 - 46.6 % Final MCV - [facility] 08/15/2022 87 79 - 97 fL Final MCH - [facility] 08/15/2022 28.7 26.6 - 33.0 pg Final MCHC - [facility] 08/15/2022 32.9 31.5 - 35.7 g/dL Final RDW - [facility] 08/15/2022 12.3 11.7 - 15.4 % Final Platelets - [facility] 08/15/2022 271 150 - 450 x10E3/uL Final Neutrophils - [facility] 08/15/2022 60 Not Estab. % Final LYMPHS - [facility] 08/15/2022 32 Not Estab. % Final Monocytes - [facility] 08/15/2022 6 Not Estab. % Final Eos - [facility] 08/15/2022 1 Not Estab. % Final Basos - [facility] 08/15/2022 1 Not Estab. % Final Neutrophils (Absolute) - [facility] 08/15/2022 4.5 1.4 - 7.0 x10E3/uL Final Lymphs  (Absolute) - [facility] 08/15/2022 2.4 0.7 - 3.1 x10E3/uL Final Monocytes(Absolute) - [facility] 08/15/2022 0.4 0.1 - 0.9 x10E3/uL Final Eos (Absolute) - [facility] 08/15/2022 0.1 0.0 - 0.4 x10E3/uL Final Baso (Absolute) - [facility] 08/15/2022 0.0 0.0 - 0.2 x10E3/uL Final Immature Granulocytes - [facility] 08/15/2022 0 Not Estab. % Final Immature Grans (Abs) - [facility] 08/15/2022 0.0 0.0 - 0.1 x10E3/uL Final Protein Total - [facility] 08/15/2022 6.3 6.0 - 8.5 g/dL Final Albumin - [facility] 08/15/2022 4.1 4.0 - 5.0 g/dL Final Bilirubin Total - [facility] 08/15/2022 <0.2 0.0 - 1.2 mg/dL Final Bilirubin, Direct - [facility] 08/15/2022 <0.10 0.00 - 0.40 mg/dL Final Alkaline Phosphatase - [facility] 08/15/2022 59 44 - 121 IU/L Final AST (SGOT) - [facility] 08/15/2022 13 0 - 40 IU/L Final ALT (SGPT) - [facility] 08/15/2022 7 0 - 32 IU/L Final Anti-MPO Antibodies - [facility] 08/15/2022 <0.2 0.0 - 0.9 units Final Anti-PR3 Antibodies - [facility] 08/15/2022 <0.2 0.0 - 0.9 units Final Gytoplasmic (C-ANCA) - [facility] 08/15/2022 <1:20 Neg:<1:20 titer Final Perinuclear (P-ANCA) - [facility] 08/15/2022 <1:20 Neg:<1:20 titer Final Atypical pANCA - [facility] 08/15/2022 <1:20 Neg:<1:20 titer Final Sjogren's Anti-SS-A - [facility] 08/15/2022 <0.2 0.0 - 0.9 AI Final Sjogren's Anti-SS-B - [facility] 08/15/2022 <0.2 0.0 - 0.9 AI Final Creatinine - [facility] 08/15/2022 0.70 0.57 - 1.00 mg/dL Final EGFR (CKD-EPI 2021) - [facility] 08/15/2022 119 >59 mL/min/1.73 Final Sed Rate - [facility] 08/15/2022 23 0 - 32 mm/hr Final C Reactive Protein - [facility] 08/15/2022 9 0 - 10 mg/L Final  Assessment and Plan: Polyarthralgia (primary encounter diagnosis) Plan: Ambulatory Referral to Neurology, Ambulatory Referral to Gastroenterology, Actin (Smooth Muscle) Antibody - [facility], Ferritin, Serum - [facility], Iron and TIBC - [facility], Mitochondrial (M2) Antibody - [facility], Vitamin B12 - [facility], Vitamin D, 25-Hydroxy - [facility], SeroNeg RAx4 Profile - [facility], CANCELED: Actin  (Smooth Muscle) Antibody - [facility], CANCELED: Mitochondrial (M2) Antibody - [facility], CANCELED: Vitamin D, 25-Hydroxy - [facility], CANCELED: Iron and TIBC - [facility], CANCELED: Ferritin, Serum - [facility], CANCELED: Vitamin B12 - [facility]  Abnormal blood level of copper Plan: Ambulatory Referral to Neurology, Ambulatory Referral to Gastroenterology, Actin (Smooth Muscle) Antibody - [facility], Ferritin, Serum - [facility], Iron and TIBC - [facility], Mitochondrial (M2) Antibody - [facility], Vitamin B12 - [facility], Vitamin D, 25-Hydroxy - [facility], CANCELED: Actin (Smooth Muscle) Antibody - [facility], CANCELED: Mitochondrial (M2) Antibody - [facility], CANCELED: Vitamin D, 25-Hydroxy - [facility], CANCELED: Iron and TIBC - [facility], CANCELED: Ferritin, Serum - [facility], CANCELED: Vitamin B12 - [facility]  Disequilibrium Plan: Ambulatory Referral to Neurology, Ambulatory Referral to Gastroenterology, Actin (Smooth Muscle) Antibody - [facility], Ferritin, Serum - [facility], Iron and TIBC - [facility], Mitochondrial (M2) Antibody - [facility], Vitamin  B12 - [facility], Vitamin D, 25-Hydroxy - [facility], CANCELED: Actin (Smooth Muscle) Antibody - [facility], CANCELED: Mitochondrial (M2) Antibody - [facility], CANCELED: Vitamin D, 25-Hydroxy - [facility], CANCELED: Iron and TIBC - [facility], CANCELED: Ferritin, Serum - [facility], CANCELED: Vitamin B12 - [facility]  Muscle twitch Plan: Ambulatory Referral to Neurology, Ambulatory Referral to Gastroenterology  Diarrhea, unspecified type Plan: Ambulatory Referral to Neurology, Ambulatory Referral to Gastroenterology  Constipation, unspecified constipation type Plan: Ambulatory Referral to Neurology, Ambulatory Referral to Gastroenterology  History and physical remain inconsistent with any rheumatologic diagnosis I know of. She does have some MCP pains today but without synovitis or other joint pains that would suggest a rheumatoid pattern, and RF and anti-CCP are negative. Given her  multiorgan symptoms including fasciculations, joint pains, diarrhea, constipation, disequilibrium, presyncopal episodes, hives, and notably elevated blood copper levels that thus far have been elusive even after rheumatology, gastroenterology, neurology, allergy/immunology, and dermatology evaluations in the community setting she now merits evaluation by academic medical specialists. After discussion with patient we will start with academic GI and neurology evaluations and defer to them regarding whether additional academic specialists will be necessary.   HPI 11/15/2016: Patient is here as a new request.  New request from Dr. [name] for evaluation.  She has been seen in the past for multiple neurologic complaints including dizziness (last > 3 years ago) with no neurologic etiology, seen by ENT and multiple doctors for her dizziness and other neurologic/somatic. MRI has shown stable incidental pineal cyst and one stable incidental t2 hyperintensity, not concerning( Small 66m right frontal periventricular focus of non-specific gliosis). She is having the same symptoms she described when I last saw her, weakness of legs, overheating, dizziness, since then the symptoms resolved and she was very active and asymptomatic.  In June she was helping renovate a house, she was bit by something, started as a pin prick, the next day was bigger. Not a ring lesion and 3 days later it was bigger and red with streaking up the arm and kept increasing. She saw Dr. GDalbert Mayotte After that she started "slipping", had an episode of feeling dizzy, lightheaded, like when you stand up too fast, this is before she was treated for the bite after that she was treated with antibiotics. Symptoms better with her period. Now she has issues with the same symptoms, difficulty walking long distances, standing in place, she feels she gets weak, tunnel vision, light-headeness, dizziness, like she is going to pass out, she will sit down and feels  better, putting head between knees better. No fevers, chills, no rash, no symptoms currently.  No loss of consciousness, no seizure-like activity, no altered mental status.  No other focal neurologic deficits, associated symptoms, inciting events or modifiable factors.  Reviewed notes: Labs neg cbc, b12, lyme,rmsf,ck,ana,cmp,tsh.  I reviewed PCP notes, patient was seen for multiple concerns including dizziness, heat intolerance, fatigue, unsteadiness, muscle twitching, headache, motion sickness in car, insomnia and generalized malaise, noted weight loss over last several months, largely unclear cause, endorses remote history of seizure episodes, not postictal, no bladder incontinence or tongue biting, she was requested to be seen back to me for multiple neurologic concerns.  He did an evaluation with several labs and check for tickborne illness.  Interval history 01/2016:  Caroline HARGREAVESis a 32y.o. female here as a referral from Dr. GDanise Minafor multiple neurologic complaints. She is here to review MRi results which did not show etiology for any of her symptoms.   Normal labs: CK,  TSH, HIV, ANA, B12, RPR, Hep C, Celiac Antibodies, RF, CMP, heavy metals, hgba1c.  IMPRESSION:   Equivocal MRI brain (with and without) demonstrating: 1. Small 41m right frontal periventricular focus of non-specific gliosis. No abnormal lesions are seen on post contrast views.    2. Incidental pineal cyst noted (9106m. If clinically indicated, may consider repeat scan in 6-12 months to ensure stability.    CC:  Dizziness, fatigue, weight loss, nausea, headaches, unsteadiness, muscle weakness, tingling  HPI 11/2015:  Caroline [name] a 89.o. female here as a referral from Dr. PhHardin Negusor multiple neurologic complaints. Started in October with Dizziness, fatigue, weight loss, nausea, headaches, unsteadiness, muscle weakness, tingling. She was treated with Levaquin and Prednisone. She has been seen by ENT and multiple  primary cares. She was tested for Lyme disease. When she eats, everything gets worse. Her legs get "twichy" her toes twitch without her, from the kness down, kt starts in the ankles and her ankles get weak and then she gets numb from the feet going up the tops up. The symptoms are episodic. When she stands and walks her symptoms are worse. She is "achy" ;ike she did a workup and she is full of lactic acid. Mostly in her lower back and mid back. Her muscles have been very tight. Symptoms are continuous. When she stands up, she feels unsteady, she gets dizzy and lightheaded and feels like she is going to pass out, her legs feel weak, she feels lightheaded and feels like she is going to pass out. She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

## 2023-03-05 NOTE — Telephone Encounter (Signed)
Referral for cardiology fax to [facility]. Phone:L [phone], Fax: [phone].

## 2023-03-05 NOTE — Patient Instructions (Addendum)
Orders Placed This Encounter  Procedures   MR LIVER W WO CONTRAST   CT VIRTUAL COLONOSCOPY SCREENING   CBC with Differential/Platelets   Iron, TIBC and Ferritin Panel   Transferrin   Soluble transferrin receptor   Rheumatoid factor   CYCLIC CITRUL PEPTIDE ANTIBODY, IGG/IGA   Copper, serum   Ceruloplasmin   Comprehensive metabolic panel   Celiac Panel 10   Vitamin B6   Vitamin C   Vitamin E   Vitamin B2, Whole Blood   Vitamin B5   Vitamin A   Vitamin B1   Comprehensive metabolic panel   Vitamin B2, Whole Blood   Vitamin B3   B12 and Folate Panel   Methylmalonic acid, serum   Vitamin D, 25-hydroxy   Multiple Myeloma Panel (SPEP&IFE w/QIG)   Magnesium   Ambulatory referral to Cardiology   Ambulatory Referral to DSME/T   NCV with EMG(electromyography)        Non-Drug Treatment for Low Blood Pressure  1. Changing Postures:  Change posture slowly when getting up, especially in the morning  Hold on to something during the first few minutes after standing up. Do not start walking as soon as you get up from the chair  Avoid prolonged recumbency or lying down  Raise the head of the bed by 10 to 20 degrees  2. Exercise:  Perform Isotonic exercise, e.g.recumbent bike, pedaling movements while sitting in a chair  Avoid exercises where you have to strain   3. Avoid Pooling of blood in legs:  Wear custom-fitted elastic stockings. The ones which extend to the abdomen work even better. Consider wearing an abdominal binder.  Perform physical counter-maneuvers, such as crossing legs and tensing leg muscles.  4. Eating and Drinking:  Small meals are recommended. Avoid large meals. Avoid standing suddenly after a large meal  Avoid alcohol  Increase intake of fluids and regular salt. A daily intake of up to 10 grams of sodium per day and a fluid intake of 2.0 to 2.5 liters per day (8 to 10 glasses of water) is recommended.   Rapid (over 3 minutes) ingestion of approximately 0.5  liter (2 glasses of water) of tap water, raises blood pressure within 5 to 15 minutes and lasts for an hour.  5. Other Tips:  Avoid hot baths. Instead take warm baths  Maintain a BP record standing and lying down  If you are only any BP lowering drugs (antihypertensives, diuretics, antidepressants, drugs for prostate, etc) ask your doctor to revisit the need to keep you on these drugs  If all non-drug therapy fails, ask your doctor about drug therapy

## 2023-03-06 LAB — COMPREHENSIVE METABOLIC PANEL
ALT: 15 IU/L (ref 0–32)
AST: 28 IU/L (ref 0–40)
Albumin/Globulin Ratio: 1.9 (ref 1.2–2.2)
Albumin: 4.6 g/dL (ref 3.9–4.9)
Alkaline Phosphatase: 85 IU/L (ref 44–121)
BUN/Creatinine Ratio: 9 (ref 9–23)
BUN: 6 mg/dL (ref 6–20)
Bilirubin Total: 0.3 mg/dL (ref 0.0–1.2)
CO2: 25 mmol/L (ref 20–29)
Calcium: 9.7 mg/dL (ref 8.7–10.2)
Chloride: 104 mmol/L (ref 96–106)
Creatinine, Ser: 0.64 mg/dL (ref 0.57–1.00)
Globulin, Total: 2.4 g/dL (ref 1.5–4.5)
Glucose: 88 mg/dL (ref 70–99)
Potassium: 3.9 mmol/L (ref 3.5–5.2)
Sodium: 142 mmol/L (ref 134–144)
Total Protein: 7 g/dL (ref 6.0–8.5)
eGFR: 121 mL/min/{1.73_m2} (ref >59)

## 2023-03-08 LAB — VITAMIN B2, WHOLE BLOOD

## 2023-03-14 ENCOUNTER — Telehealth: Payer: Self-pay | Admitting: Neurology

## 2023-03-14 LAB — MULTIPLE MYELOMA PANEL, SERUM
Albumin SerPl Elph-Mcnc: 4.2 g/dL (ref 2.9–4.4)
Albumin/Glob SerPl: 1.5 (ref 0.7–1.7)
Alpha 1: 0.3 g/dL (ref 0.0–0.4)
Alpha2 Glob SerPl Elph-Mcnc: 0.8 g/dL (ref 0.4–1.0)
B-Globulin SerPl Elph-Mcnc: 0.9 g/dL (ref 0.7–1.3)
Gamma Glob SerPl Elph-Mcnc: 0.9 g/dL (ref 0.4–1.8)
Globulin, Total: 2.9 g/dL (ref 2.2–3.9)
IgG (Immunoglobin G), Serum: 855 mg/dL (ref 586–1602)
IgM (Immunoglobulin M), Srm: 106 mg/dL (ref 26–217)

## 2023-03-14 LAB — CBC WITH DIFFERENTIAL/PLATELET
Basophils Absolute: 0 10*3/uL (ref 0.0–0.2)
Basos: 0 %
EOS (ABSOLUTE): 0.1 10*3/uL (ref 0.0–0.4)
Eos: 1 %
Hematocrit: 40.7 % (ref 34.0–46.6)
Hemoglobin: 13.6 g/dL (ref 11.1–15.9)
Immature Grans (Abs): 0 10*3/uL (ref 0.0–0.1)
Immature Granulocytes: 0 %
Lymphocytes Absolute: 2.7 10*3/uL (ref 0.7–3.1)
Lymphs: 37 %
MCH: 29.2 pg (ref 26.6–33.0)
MCHC: 33.4 g/dL (ref 31.5–35.7)
MCV: 88 fL (ref 79–97)
Monocytes Absolute: 0.5 10*3/uL (ref 0.1–0.9)
Monocytes: 7 %
Neutrophils Absolute: 3.8 10*3/uL (ref 1.4–7.0)
Neutrophils: 55 %
Platelets: 282 10*3/uL (ref 150–450)
RBC: 4.65 x10E6/uL (ref 3.77–5.28)
RDW: 12.3 % (ref 11.7–15.4)
WBC: 7.1 10*3/uL (ref 3.4–10.8)

## 2023-03-14 LAB — IRON,TIBC AND FERRITIN PANEL
Ferritin: 32 ng/mL (ref 15–150)
Iron Saturation: 20 % (ref 15–55)
Iron: 80 ug/dL (ref 27–159)
Total Iron Binding Capacity: 395 ug/dL (ref 250–450)
UIBC: 315 ug/dL (ref 131–425)

## 2023-03-14 LAB — COMPREHENSIVE METABOLIC PANEL
ALT: 13 IU/L (ref 0–32)
AST: 26 IU/L (ref 0–40)
Albumin/Globulin Ratio: 2 (ref 1.2–2.2)
Albumin: 4.7 g/dL (ref 3.9–4.9)
Alkaline Phosphatase: 85 IU/L (ref 44–121)
BUN/Creatinine Ratio: 11 (ref 9–23)
BUN: 6 mg/dL (ref 6–20)
Bilirubin Total: 0.3 mg/dL (ref 0.0–1.2)
CO2: 24 mmol/L (ref 20–29)
Calcium: 9.6 mg/dL (ref 8.7–10.2)
Chloride: 103 mmol/L (ref 96–106)
Creatinine, Ser: 0.57 mg/dL (ref 0.57–1.00)
Globulin, Total: 2.4 g/dL (ref 1.5–4.5)
Glucose: 84 mg/dL (ref 70–99)
Potassium: 3.8 mmol/L (ref 3.5–5.2)
Sodium: 141 mmol/L (ref 134–144)
Total Protein: 7.1 g/dL (ref 6.0–8.5)
eGFR: 125 mL/min/{1.73_m2} (ref >59)

## 2023-03-14 LAB — B12 AND FOLATE PANEL
Folate: 18.5 ng/mL (ref >3.0)
Vitamin B-12: 838 pg/mL (ref 232–1245)

## 2023-03-14 LAB — CELIAC PANEL 10
Antigliadin Abs, IgA: 2 units (ref 0–19)
Endomysial IgA: NEGATIVE
Gliadin IgG: 4 units (ref 0–19)
IgA/Immunoglobulin A, Serum: 56 mg/dL — ABNORMAL LOW (ref 87–352)
Tissue Transglut Ab: <2 U/mL (ref 0–5)
Transglutaminase IgA: <2 U/mL (ref 0–3)

## 2023-03-14 LAB — VITAMIN B3
Nicotinamide: 15.7 ng/mL (ref 5.2–72.1)
Nicotinic Acid: <5 ng/mL (ref 0.0–5.0)

## 2023-03-14 LAB — RHEUMATOID FACTOR: Rheumatoid fact SerPl-aCnc: 10.1 IU/mL (ref <14.0)

## 2023-03-14 LAB — COPPER, SERUM: Copper: 166 ug/dL — ABNORMAL HIGH (ref 80–158)

## 2023-03-14 LAB — MAGNESIUM: Magnesium: 2.3 mg/dL (ref 1.6–2.3)

## 2023-03-14 LAB — VITAMIN E
Vitamin E (Alpha Tocopherol): 14.1 mg/L (ref 5.9–19.4)
Vitamin E(Gamma Tocopherol): 0.9 mg/L (ref 0.7–4.9)

## 2023-03-14 LAB — VITAMIN B1: Thiamine: 118.9 nmol/L (ref 66.5–200.0)

## 2023-03-14 LAB — VITAMIN B2, WHOLE BLOOD: Vitamin B2, Whole Blood: 198 ug/L (ref 137–370)

## 2023-03-14 LAB — SOLUBLE TRANSFERRIN RECEPTOR: Transferrin Receptor: 11.6 nmol/L — ABNORMAL LOW (ref 12.2–27.3)

## 2023-03-14 LAB — VITAMIN A: Vitamin A: 45.1 ug/dL (ref 18.9–57.3)

## 2023-03-14 LAB — METHYLMALONIC ACID, SERUM: Methylmalonic Acid: 102 nmol/L (ref 0–378)

## 2023-03-14 LAB — CERULOPLASMIN: Ceruloplasmin: 38.8 mg/dL (ref 19.0–39.0)

## 2023-03-14 LAB — VITAMIN D 25 HYDROXY (VIT D DEFICIENCY, FRACTURES): Vit D, 25-Hydroxy: 44.6 ng/mL (ref 30.0–100.0)

## 2023-03-14 LAB — VITAMIN B6: Vitamin B6: 16.3 ug/L (ref 3.4–65.2)

## 2023-03-14 LAB — VITAMIN B5: Vitamin B5: 38.8 ng/mL (ref 12.9–253.1)

## 2023-03-14 LAB — TRANSFERRIN: Transferrin: 330 mg/dL (ref 192–364)

## 2023-03-14 LAB — VITAMIN C: Vitamin C: 1.4 mg/dL (ref 0.4–2.0)

## 2023-03-14 LAB — CYCLIC CITRUL PEPTIDE ANTIBODY, IGG/IGA: Cyclic Citrullin Peptide Ab: 4 units (ref 0–19)

## 2023-03-14 NOTE — Telephone Encounter (Signed)
sent to [facility] [phone]

## 2023-03-15 ENCOUNTER — Telehealth: Payer: BC Managed Care – PPO | Admitting: Physician Assistant

## 2023-03-15 ENCOUNTER — Telehealth: Payer: Self-pay | Admitting: Cardiovascular Disease

## 2023-03-15 DIAGNOSIS — B9689 Other specified bacterial agents as the cause of diseases classified elsewhere: Secondary | ICD-10-CM | POA: Diagnosis not present

## 2023-03-15 DIAGNOSIS — J019 Acute sinusitis, unspecified: Secondary | ICD-10-CM | POA: Diagnosis not present

## 2023-03-15 MED ORDER — CEFDINIR 300 MG PO CAPS: 300.0000 mg | ORAL_CAPSULE | Freq: Two times a day (BID) | ORAL | 0 refills | Status: DC

## 2023-03-15 NOTE — Progress Notes (Signed)
Virtual Visit Consent   Caroline [name], you are scheduled for a virtual visit with a [facility] provider today. Just as with appointments in the office, your consent must be obtained to participate. Your consent will be active for this visit and any virtual visit you may have with one of our providers in the next 365 days. If you have a MyChart account, a copy of this consent can be sent to you electronically.  As this is a virtual visit, video technology does not allow for your provider to perform a traditional examination. This may limit your provider's ability to fully assess your condition. If your provider identifies any concerns that need to be evaluated in person or the need to arrange testing (such as labs, EKG, etc.), we will make arrangements to do so. Although advances in technology are sophisticated, we cannot ensure that it will always work on either your end or our end. If the connection with a video visit is poor, the visit may have to be switched to a telephone visit. With either a video or telephone visit, we are not always able to ensure that we have a secure connection.  By engaging in this virtual visit, you consent to the provision of healthcare and authorize for your insurance to be billed (if applicable) for the services provided during this visit. Depending on your insurance coverage, you may receive a charge related to this service.  I need to obtain your verbal consent now. Are you willing to proceed with your visit today? Caroline [name] has provided verbal consent on 03/15/2023 for a virtual visit (video or telephone). Leeanne Rio, [place]  Date: 03/15/2023 2:10 PM  Virtual Visit via Video Note   I, Leeanne Rio, [name]  ([id number], 1991-02-10) on 03/15/23 at  1:45 PM EDT by a video-enabled telemedicine application and verified that I am speaking with the correct person using two identifiers.  Location: Patient: Virtual Visit Location  Patient: Home Provider: Virtual Visit Location Provider: Home Office   I discussed the limitations of evaluation and management by telemedicine and the availability of in person appointments. The patient expressed understanding and agreed to proceed.    History of Present Illness: Caroline [name] is a 32 y.o. who identifies as a female who was assigned female at birth, and is being seen today for 5 days of increased nasal congestion with sinus pressure, ear pressure which is affecting her chronic dizziness (episodic since COVID-19 infection). Denies fever or chills. Denies SOB. Notes increased heart rate (has chronic tachycardia and dysautonomia after COVID-19 as well) but controlled with her metoprolol. Has been using OTC Motrin, Nasacort, Zyrtec, Allegra, Simply Saline, sipping on electrolyte water.  Stopped Flonase nasal spray due to a nose bleed.    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   Bee sting 07/25/2022   History of iron deficiency 07/19/2022   Allergy status to other drugs, medicaments and biological substances 06/24/2022   Hypertriglyceridemia 03/02/2022   Low iron stores 03/02/2022   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   High blood copper level 10/07/2021   Vitamin B12 deficiency 10/07/2021  Hand swelling 09/29/2021   Hives 08/16/2021   Paroxysmal tachycardia (HCC) 05/12/2021   Insomnia 10/20/2020   Paresthesias 09/25/2020   Vasovagal near-syncope 09/17/2019   Orthostatic intolerance 06/20/2019   Chronic fatigue 06/26/2018   Acute sinusitis 07/23/2017   Seasonal allergic rhinitis    Pineal gland cyst 01/31/2016   White matter  abnormality on MRI of brain 01/31/2016   Vitamin D deficiency 05/30/2010   CONSTIPATION, CHRONIC 05/30/2010   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 Caused rash all over.    Medications:  Current Outpatient Medications:    cefdinir (OMNICEF) 300 MG capsule, Take 1 capsule (300 mg total) by mouth 2 (two) times daily., Disp: 20 capsule, Rfl: 0   cetirizine (ZYRTEC) 10 MG tablet, Take 10 mg by mouth daily as needed for allergies., Disp: , Rfl:    Cholecalciferol (VITAMIN D3) 25 MCG (1000 UT) CAPS, Take 1 capsule by mouth daily., Disp: , Rfl:    co-enzyme Q-10 30 MG capsule, Take 100 mg by mouth daily. URQuinol., Disp: , Rfl:    cyanocobalamin (VITAMIN B12) 1000 MCG tablet, Take 1,000 mcg by mouth 2 (two) times daily., Disp: , Rfl:    diphenhydrAMINE (BENADRYL) 25 mg capsule, Take 50 mg by mouth every 6 (six) hours as needed for allergies or itching., Disp: , Rfl:    fluticasone (FLONASE) 50 MCG/ACT nasal spray, Place into both nostrils daily., Disp: , Rfl:    ketoconazole (NIZORAL) 2 % shampoo, apply three times per week, massage into scalp and leave in for 10 minutes before rinsing out (Patient taking differently: as needed. apply three times per week, massage into scalp and leave in for 10 minutes before rinsing out), Disp: 120 mL, Rfl: 2   LORazepam (ATIVAN) 0.5 MG tablet, Take 0.5-1 tablets (0.25-0.5 mg total) by mouth at bedtime as needed for anxiety or sleep., Disp: 20 tablet, Rfl: 0   metoprolol tartrate (LOPRESSOR) 25 MG tablet, Take 0.5 tablet (12.5 mg) by mouth twice daily as needed for tachycardia (Patient not taking:  Reported on 03/05/2023), Disp: 60 tablet, Rfl: 3   mometasone (ELOCON) 0.1 % cream, Apply once to twice daily to itchy rash on chest, shoulders, back until improved. Avoid face, groin, underarms. (Patient taking differently: as needed. Apply once to twice daily to itchy rash on chest, shoulders, back until improved. Avoid face, groin, underarms.), Disp: 45 g, Rfl: 1   Multiple Vitamin (MULTIVITAMIN ADULT) TABS, Take 1 tablet by mouth daily. Nature Made MVI for Her, Disp: , Rfl:    Oxymetazoline HCl 1 % CREA, Apply topically., Disp: , Rfl:    pimecrolimus (ELIDEL) 1 % cream, Apply topically 2 (two) times daily. For rash anywhere on the body., Disp: 30 g, Rfl: 2   polyethylene glycol (MIRALAX / GLYCOLAX) packet, Take 17 g by mouth daily. As needed, Disp: , Rfl:    Prucalopride Succinate (MOTEGRITY) 1 MG TABS, Take 1 mg by mouth daily., Disp: 90 tablet, Rfl: 0   Ruxolitinib Phosphate (OPZELURA) 1.5 % CREA, Apply to affected skin once a day, Disp: 60 g, Rfl:  2   triamcinolone (NASACORT) 55 MCG/ACT AERO nasal inhaler, Place 2 sprays into the nose daily., Disp: 1 Inhaler, Rfl:    Zinc 50 MG TABS, Take by mouth. Takes 2 times weekly, Disp: , Rfl:   Observations/Objective: Patient is well-developed, well-nourished in no acute distress.  Resting comfortably at home.  Head is normocephalic, atraumatic.  No labored breathing. Speech is clear and coherent with logical content.  Patient is alert and oriented at baseline.   Assessment and Plan: 1. Acute bacterial sinusitis - cefdinir (OMNICEF) 300 MG capsule; Take 1 capsule (300 mg total) by mouth 2 (two) times daily.  Dispense: 20 capsule; Refill: 0  Rx Cefdinir (due to allergies/intolerances).  Increase fluids.  Rest.  Saline nasal spray.  Probiotic.  Mucinex as directed.  Humidifier in bedroom. Ok to continue antihistamine and saline nasal rinses. She needs to keep follow-up with her multiple specialists for her ongoing issues and ENT appt scheduled for  4/1.  Call or return to clinic if symptoms are not improving.   Follow Up Instructions: I discussed the assessment and treatment plan with the patient. The patient was provided an opportunity to ask questions and all were answered. The patient agreed with the plan and demonstrated an understanding of the instructions.  A copy of instructions were sent to the patient via MyChart unless otherwise noted below.   The patient was advised to call back or seek an in-person evaluation if the symptoms worsen or if the condition fails to improve as anticipated.  Time:  I spent 10 minutes with the patient via telehealth technology discussing the above problems/concerns.    Leeanne Rio, PA-C

## 2023-03-15 NOTE — Patient Instructions (Signed)
[facility], thank you for joining Leeanne Rio, PA-C for today's virtual visit.  While this provider is not your primary care provider (PCP), if your PCP is located in our provider database this encounter information will be shared with them immediately following your visit.   [facility] account gives you access to today's visit and all your visits, tests, and labs performed at [facility] " click here if you don't have a [facility] account or go to [email or web address]  Consent: (Patient) Caroline [name] provided verbal consent for this virtual visit at the beginning of the encounter.  Current Medications:  Current Outpatient Medications:    cefdinir (OMNICEF) 300 MG capsule, Take 1 capsule (300 mg total) by mouth 2 (two) times daily., Disp: 20 capsule, Rfl: 0   cetirizine (ZYRTEC) 10 MG tablet, Take 10 mg by mouth daily as needed for allergies., Disp: , Rfl:    Cholecalciferol (VITAMIN D3) 25 MCG (1000 UT) CAPS, Take 1 capsule by mouth daily., Disp: , Rfl:    co-enzyme Q-10 30 MG capsule, Take 100 mg by mouth daily. URQuinol., Disp: , Rfl:    cyanocobalamin (VITAMIN B12) 1000 MCG tablet, Take 1,000 mcg by mouth 2 (two) times daily., Disp: , Rfl:    diphenhydrAMINE (BENADRYL) 25 mg capsule, Take 50 mg by mouth every 6 (six) hours as needed for allergies or itching., Disp: , Rfl:    fluticasone (FLONASE) 50 MCG/ACT nasal spray, Place into both nostrils daily., Disp: , Rfl:    ketoconazole (NIZORAL) 2 % shampoo, apply three times per week, massage into scalp and leave in for 10 minutes before rinsing out (Patient taking differently: as needed. apply three times per week, massage into scalp and leave in for 10 minutes before rinsing out), Disp: 120 mL, Rfl: 2   LORazepam (ATIVAN) 0.5 MG tablet, Take 0.5-1 tablets (0.25-0.5 mg total) by mouth at bedtime as needed for anxiety or sleep., Disp: 20 tablet, Rfl: 0   metoprolol tartrate (LOPRESSOR) 25 MG  tablet, Take 0.5 tablet (12.5 mg) by mouth twice daily as needed for tachycardia (Patient not taking: Reported on 03/05/2023), Disp: 60 tablet, Rfl: 3   mometasone (ELOCON) 0.1 % cream, Apply once to twice daily to itchy rash on chest, shoulders, back until improved. Avoid face, groin, underarms. (Patient taking differently: as needed. Apply once to twice daily to itchy rash on chest, shoulders, back until improved. Avoid face, groin, underarms.), Disp: 45 g, Rfl: 1   Multiple Vitamin (MULTIVITAMIN ADULT) TABS, Take 1 tablet by mouth daily. Nature Made MVI for Her, Disp: , Rfl:    Oxymetazoline HCl 1 % CREA, Apply topically., Disp: , Rfl:    pimecrolimus (ELIDEL) 1 % cream, Apply topically 2 (two) times daily. For rash anywhere on the body., Disp: 30 g, Rfl: 2   polyethylene glycol (MIRALAX / GLYCOLAX) packet, Take 17 g by mouth daily. As needed, Disp: , Rfl:    Prucalopride Succinate (MOTEGRITY) 1 MG TABS, Take 1 mg by mouth daily., Disp: 90 tablet, Rfl: 0   Ruxolitinib Phosphate (OPZELURA) 1.5 % CREA, Apply to affected skin once a day, Disp: 60 g, Rfl: 2   triamcinolone (NASACORT) 55 MCG/ACT AERO nasal inhaler, Place 2 sprays into the nose daily., Disp: 1 Inhaler, Rfl:    Zinc 50 MG TABS, Take by mouth. Takes 2 times weekly, Disp: , Rfl:    Medications ordered in this encounter:  Meds ordered this encounter  Medications   cefdinir (OMNICEF)  300 MG capsule    Sig: Take 1 capsule (300 mg total) by mouth 2 (two) times daily.    Dispense:  20 capsule    Refill:  0    Order Specific Question:   Supervising Provider    Answer:   Chase Picket [id number]     *If you need refills on other medications prior to your next appointment, please contact your pharmacy*  Follow-Up: Call back or seek an in-person evaluation if the symptoms worsen or if the condition fails to improve as anticipated.  [facility] [phone]  Other Instructions Please take antibiotic as directed.   Increase fluid intake.  Use Saline nasal spray.  Take a daily multivitamin. Ok to use plain Mucinex. Keep up with antihistamine.  Place a humidifier in the bedroom.  Please follow-up with your multiple specialists and PCP as scheduled.   Sinusitis Sinusitis is redness, soreness, and swelling (inflammation) of the paranasal sinuses. Paranasal sinuses are air pockets within the bones of your face (beneath the eyes, the middle of the forehead, or above the eyes). In healthy paranasal sinuses, mucus is able to drain out, and air is able to circulate through them by way of your nose. However, when your paranasal sinuses are inflamed, mucus and air can become trapped. This can allow bacteria and other germs to grow and cause infection. Sinusitis can develop quickly and last only a short time (acute) or continue over a long period (chronic). Sinusitis that lasts for more than 12 weeks is considered chronic.  CAUSES  Causes of sinusitis include: Allergies. Structural abnormalities, such as displacement of the cartilage that separates your nostrils (deviated septum), which can decrease the air flow through your nose and sinuses and affect sinus drainage. Functional abnormalities, such as when the small hairs (cilia) that line your sinuses and help remove mucus do not work properly or are not present. SYMPTOMS  Symptoms of acute and chronic sinusitis are the same. The primary symptoms are pain and pressure around the affected sinuses. Other symptoms include: Upper toothache. Earache. Headache. Bad breath. Decreased sense of smell and taste. A cough, which worsens when you are lying flat. Fatigue. Fever. Thick drainage from your nose, which often is green and may contain pus (purulent). Swelling and warmth over the affected sinuses. DIAGNOSIS  Your caregiver will perform a physical exam. During the exam, your caregiver may: Look in your nose for signs of abnormal growths in your nostrils (nasal  polyps). Tap over the affected sinus to check for signs of infection. View the inside of your sinuses (endoscopy) with a special imaging device with a light attached (endoscope), which is inserted into your sinuses. If your caregiver suspects that you have chronic sinusitis, one or more of the following tests may be recommended: Allergy tests. Nasal culture A sample of mucus is taken from your nose and sent to a lab and screened for bacteria. Nasal cytology A sample of mucus is taken from your nose and examined by your caregiver to determine if your sinusitis is related to an allergy. TREATMENT  Most cases of acute sinusitis are related to a viral infection and will resolve on their own within 10 days. Sometimes medicines are prescribed to help relieve symptoms (pain medicine, decongestants, nasal steroid sprays, or saline sprays).  However, for sinusitis related to a bacterial infection, your caregiver will prescribe antibiotic medicines. These are medicines that will help kill the bacteria causing the infection.  Rarely, sinusitis is caused by a  fungal infection. In theses cases, your caregiver will prescribe antifungal medicine. For some cases of chronic sinusitis, surgery is needed. Generally, these are cases in which sinusitis recurs more than 3 times per year, despite other treatments. HOME CARE INSTRUCTIONS  Drink plenty of water. Water helps thin the mucus so your sinuses can drain more easily. Use a humidifier. Inhale steam 3 to 4 times a day (for example, sit in the bathroom with the shower running). Apply a warm, moist washcloth to your face 3 to 4 times a day, or as directed by your caregiver. Use saline nasal sprays to help moisten and clean your sinuses. Take over-the-counter or prescription medicines for pain, discomfort, or fever only as directed by your caregiver. SEEK IMMEDIATE MEDICAL CARE IF: You have increasing pain or severe headaches. You have nausea, vomiting, or  drowsiness. You have swelling around your face. You have vision problems. You have a stiff neck. You have difficulty breathing. MAKE SURE YOU:  Understand these instructions. Will watch your condition. Will get help right away if you are not doing well or get worse. Document Released: 12/11/2005 Document Revised: 03/04/2012 Document Reviewed: 12/26/2011 [facility] Patient Information 2014 [place], [place].    If you have been instructed to have an in-person evaluation today at a local Urgent Care facility, please use the link below. It will take you to a list of all of our available [facility], including address, phone number and hours of operation. Please do not delay care.  [facility]  If you or a family member do not have a primary care provider, use the link below to schedule a visit and establish care. When you choose a [facility] primary care physician or advanced practice provider, you gain a long-term partner in health. Find a Primary Care Provider  Learn more about [facility]'s in-office and virtual care options: [facility]

## 2023-03-15 NOTE — Telephone Encounter (Signed)
Pt c/o medication issue:  1. Name of Medication: metoprolol tartrate (LOPRESSOR) 25 MG tablet   2. How are you currently taking this medication (dosage and times per day)? Take 0.5 tablet (12.5 mg) by mouth twice daily as needed   3. Are you having a reaction (difficulty breathing--STAT)? No  4. What is your medication issue? Pt went to see her PCP and they recommended that she started back on the medication due to the pt's HR going up. Pt stated her HR was going in the high 70's and 80's while resting and then her HR would go in the 120's and 130's while moving. Pt wanted to make Dr. Rockey Situ aware of her starting the medication again

## 2023-03-16 DIAGNOSIS — M9901 Segmental and somatic dysfunction of cervical region: Secondary | ICD-10-CM | POA: Diagnosis not present

## 2023-03-16 DIAGNOSIS — M5413 Radiculopathy, cervicothoracic region: Secondary | ICD-10-CM | POA: Diagnosis not present

## 2023-03-16 DIAGNOSIS — M9903 Segmental and somatic dysfunction of lumbar region: Secondary | ICD-10-CM | POA: Diagnosis not present

## 2023-03-16 DIAGNOSIS — M5451 Vertebrogenic low back pain: Secondary | ICD-10-CM | POA: Diagnosis not present

## 2023-03-19 NOTE — Telephone Encounter (Signed)
Pt is asking that the order for her virtual colonoscopy be sent to [facility], she'd like to know if [facility] does open MRI as well, please call pt to discuss.

## 2023-03-20 NOTE — Telephone Encounter (Signed)
I sent the CT order to GI their phone number is [phone].

## 2023-03-27 DIAGNOSIS — J301 Allergic rhinitis due to pollen: Secondary | ICD-10-CM | POA: Diagnosis not present

## 2023-03-27 DIAGNOSIS — R42 Dizziness and giddiness: Secondary | ICD-10-CM | POA: Diagnosis not present

## 2023-03-27 DIAGNOSIS — J302 Other seasonal allergic rhinitis: Secondary | ICD-10-CM | POA: Diagnosis not present

## 2023-03-28 DIAGNOSIS — M2550 Pain in unspecified joint: Secondary | ICD-10-CM | POA: Diagnosis not present

## 2023-03-28 DIAGNOSIS — R253 Fasciculation: Secondary | ICD-10-CM | POA: Diagnosis not present

## 2023-03-28 DIAGNOSIS — R790 Abnormal level of blood mineral: Secondary | ICD-10-CM | POA: Diagnosis not present

## 2023-03-28 DIAGNOSIS — R42 Dizziness and giddiness: Secondary | ICD-10-CM | POA: Diagnosis not present

## 2023-04-06 DIAGNOSIS — M2550 Pain in unspecified joint: Secondary | ICD-10-CM | POA: Diagnosis not present

## 2023-04-12 DIAGNOSIS — M25512 Pain in left shoulder: Secondary | ICD-10-CM | POA: Diagnosis not present

## 2023-04-12 DIAGNOSIS — H8111 Benign paroxysmal vertigo, right ear: Secondary | ICD-10-CM | POA: Diagnosis not present

## 2023-04-12 DIAGNOSIS — M25511 Pain in right shoulder: Secondary | ICD-10-CM | POA: Diagnosis not present

## 2023-04-12 DIAGNOSIS — M546 Pain in thoracic spine: Secondary | ICD-10-CM | POA: Diagnosis not present

## 2023-04-20 DIAGNOSIS — M25511 Pain in right shoulder: Secondary | ICD-10-CM | POA: Diagnosis not present

## 2023-04-20 DIAGNOSIS — M546 Pain in thoracic spine: Secondary | ICD-10-CM | POA: Diagnosis not present

## 2023-04-20 DIAGNOSIS — M25512 Pain in left shoulder: Secondary | ICD-10-CM | POA: Diagnosis not present

## 2023-04-20 DIAGNOSIS — H8111 Benign paroxysmal vertigo, right ear: Secondary | ICD-10-CM | POA: Diagnosis not present

## 2023-04-23 ENCOUNTER — Encounter: Payer: Self-pay | Admitting: Dietician

## 2023-04-23 ENCOUNTER — Encounter: Payer: BC Managed Care – PPO | Attending: Neurology | Admitting: Dietician

## 2023-04-23 VITALS — Ht 67.0 in | Wt 162.2 lb

## 2023-04-23 DIAGNOSIS — U099 Post covid-19 condition, unspecified: Secondary | ICD-10-CM

## 2023-04-23 DIAGNOSIS — R531 Weakness: Secondary | ICD-10-CM | POA: Diagnosis not present

## 2023-04-23 DIAGNOSIS — K5909 Other constipation: Secondary | ICD-10-CM | POA: Diagnosis not present

## 2023-04-23 NOTE — Progress Notes (Signed)
Medical Nutrition Therapy: Visit start time: 1030  end time: 1130  Assessment:   Referral Diagnosis: weakness, vision loss Other medical history/ diagnoses: dysautonomia, colonic idiopathic constipation, copper toxicity, presyncope, B12 and D3 deficiencies, long COVID with tachycardia, taste changes Psychosocial issues/ stress concerns: none  Medications, supplements: reconciled list in medical record   Preferred learning method:  Visual Hands-on    Current weight: 162.2lbs with shoes Height: 5'7" BMI: 25.4 Patient's personal weight goal: 150lbs  Progress and evaluation:  Patient reports being diagnosed with malabsorption syndrome, idiopathic constipation, and diagnosis of colonic inertia at age 32 Reports frequent nausea esp after eating meats but is able to eat small amounts of meats; does do well with yogurt.  Reports weight gain of 10lbs+ since 10/2022 since starting birth control, has lost 2-3lbs since stopping several weeks ago   Developed long COVID symptoms after having second bout, including food aversions Has been diagnosed with dysautonomia, and possibly POTS On waiting list for dysaut clinic, soonest avail is 2025. Possibly has POTS and awaiting further testing.  Food allergies: no allergies known; aversion to fish Special diet practices: none Patient seeks help with healthy meal options Next PCP appt is 05/10/23, PCP 05/2023   Dietary Intake:  Usual eating pattern includes 2-3 meals and 2 snacks per day. Dining out frequency: 8 meals per week. Who plans meals/ buys groceries? sister Who prepares meals? 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   Intervention:   Nutrition Care Education:   Basic nutrition: basic food groups; appropriate nutrient balance, 1500kcal sample meal pattern for adequate nutrition and weight control; appropriate meal and snack schedule; general nutrition guidelines    Dysautonomia: possible need for higher sodium intake and suitable options, instructed on goal of at least 3000mg  per [facility] guidelines and advised gradual increase; importance of adequate/ increased fluids, small, frequent meals and snacks, avoiding high fat foods and large portions of meats especially red meats to promote digestion,  Other: encouraged Mediterranean eating pattern for overall health value per patient question   Other intervention notes: Patient has been working to ensure adequate fluid and sodium intake Low sodium diet followed by other family members in the home adds challenge for patient to achieve adequate sodium intake for her condition. Established goals with input from patient.  No follow up scheduled at this time; patient will schedule if needed after next medical appointment.   Nutritional Diagnosis:  [place]-2.1 Inpaired nutrition utilization As related to dysautonomia, chronic constipation.  As evidenced by symptoms of weakness, tachycardia, nausea. [place]-1.4 Altered GI function As related to idiopathic constipation.  As evidenced by patient reported symptoms improved with Motegrity.   Education Materials given:  Designer, industrial/product with food lists, sample meal pattern Recipes Sample menus Visit summary with goals/ instructions to be viewed via patient portal   Learner/ who was taught:  Patient   Level of understanding: Verbalizes/ demonstrates competency  Demonstrated degree of understanding via:   Teach back Learning barriers: None  Willingness  to  learn/ readiness for change: Eager, change in progress  Monitoring and Evaluation:  Dietary intake, exercise, GI symptoms, stamina and BP control, and body weight      follow up: prn

## 2023-04-23 NOTE — Progress Notes (Deleted)
.  armcmnt

## 2023-04-23 NOTE — Patient Instructions (Signed)
Continue to focus on small(ish) meals and include 2-3 snacks daily to ensure adequate nutrition and avoid digestive  Check [email or web address] for healthy recipes; yummly app [facility] recommends 3000-10,000mg  sodium daily; check with cardiologist.

## 2023-04-26 ENCOUNTER — Telehealth: Payer: Self-pay | Admitting: Cardiovascular Disease

## 2023-04-26 NOTE — Telephone Encounter (Signed)
Patient is returning call. Requesting return call.  

## 2023-04-26 NOTE — Telephone Encounter (Signed)
Patient is requesting call back to discuss tests she has had or will possibly need to have in the future for the issues she has had in the past with syncope. Please advise.

## 2023-04-26 NOTE — Telephone Encounter (Signed)
Attempted to call the patient. No answer- I left a message to please call back.  

## 2023-04-26 NOTE — Telephone Encounter (Signed)
Left a message for the patient to call back.  

## 2023-04-27 DIAGNOSIS — J45998 Other asthma: Secondary | ICD-10-CM | POA: Diagnosis not present

## 2023-04-27 DIAGNOSIS — U099 Post covid-19 condition, unspecified: Secondary | ICD-10-CM | POA: Diagnosis not present

## 2023-04-27 NOTE — Telephone Encounter (Signed)
Attempted to call patient. No answer. Left message to call back

## 2023-04-27 NOTE — Telephone Encounter (Signed)
Called and spoke with patient. Patient complaining of black out spells when she lays on her abdomen and turns her head to the right. Patient was recommended to call cardiology for recommendations for further testing. Forwarding to Dr. Mariah Milling for review.

## 2023-04-27 NOTE — Telephone Encounter (Signed)
Patient returned RN's call. 

## 2023-04-30 NOTE — Telephone Encounter (Signed)
Spoke to patient and informed her Dr. Windell Hummingbird recommendation(s) as follows: "Etiology of her symptoms is unclear, less likely cardiac in etiology. She was seen by neurology and appears consult was placed to [facility]. I think that would be the best bet to follow-up with that appointment."  Patient understood with read back

## 2023-05-03 ENCOUNTER — Encounter: Payer: Self-pay | Admitting: Dermatology

## 2023-05-03 ENCOUNTER — Ambulatory Visit: Payer: BC Managed Care – PPO | Admitting: Dermatology

## 2023-05-03 ENCOUNTER — Encounter: Payer: Self-pay | Admitting: Internal Medicine

## 2023-05-03 VITALS — BP 113/77 | HR 103

## 2023-05-03 DIAGNOSIS — Z1283 Encounter for screening for malignant neoplasm of skin: Secondary | ICD-10-CM | POA: Diagnosis not present

## 2023-05-03 DIAGNOSIS — Z86018 Personal history of other benign neoplasm: Secondary | ICD-10-CM

## 2023-05-03 DIAGNOSIS — D2262 Melanocytic nevi of left upper limb, including shoulder: Secondary | ICD-10-CM

## 2023-05-03 DIAGNOSIS — L739 Follicular disorder, unspecified: Secondary | ICD-10-CM | POA: Diagnosis not present

## 2023-05-03 DIAGNOSIS — L821 Other seborrheic keratosis: Secondary | ICD-10-CM

## 2023-05-03 DIAGNOSIS — L719 Rosacea, unspecified: Secondary | ICD-10-CM

## 2023-05-03 DIAGNOSIS — W908XXA Exposure to other nonionizing radiation, initial encounter: Secondary | ICD-10-CM

## 2023-05-03 DIAGNOSIS — D492 Neoplasm of unspecified behavior of bone, soft tissue, and skin: Secondary | ICD-10-CM | POA: Diagnosis not present

## 2023-05-03 DIAGNOSIS — D1801 Hemangioma of skin and subcutaneous tissue: Secondary | ICD-10-CM

## 2023-05-03 DIAGNOSIS — L814 Other melanin hyperpigmentation: Secondary | ICD-10-CM

## 2023-05-03 DIAGNOSIS — X32XXXA Exposure to sunlight, initial encounter: Secondary | ICD-10-CM

## 2023-05-03 DIAGNOSIS — L858 Other specified epidermal thickening: Secondary | ICD-10-CM

## 2023-05-03 DIAGNOSIS — L578 Other skin changes due to chronic exposure to nonionizing radiation: Secondary | ICD-10-CM

## 2023-05-03 MED ORDER — IVERMECTIN 1 % EX CREA: TOPICAL_CREAM | CUTANEOUS | 3 refills | Status: DC

## 2023-05-03 MED ORDER — CEPHALEXIN 500 MG PO CAPS: 500.0000 mg | ORAL_CAPSULE | Freq: Three times a day (TID) | ORAL | 0 refills | Status: AC

## 2023-05-03 MED ORDER — OXYMETAZOLINE HCL 1 % EX CREA: TOPICAL_CREAM | CUTANEOUS | 3 refills | Status: DC

## 2023-05-03 MED ORDER — PIMECROLIMUS 1 % EX CREA: TOPICAL_CREAM | CUTANEOUS | 3 refills | Status: DC

## 2023-05-03 NOTE — Patient Instructions (Addendum)
Wound Care Instructions  Cleanse wound gently with soap and water once a day then pat dry with clean gauze. Apply a thin coat of Petrolatum (petroleum jelly, "Vaseline") over the wound (unless you have an allergy to this). We recommend that you use a new, sterile tube of Vaseline. Do not pick or remove scabs. Do not remove the yellow or white "healing tissue" from the base of the wound.  Cover the wound with fresh, clean, nonstick gauze and secure with paper tape. You may use Band-Aids in place of gauze and tape if the wound is small enough, but would recommend trimming much of the tape off as there is often too much. Sometimes Band-Aids can irritate the skin.  You should call the office for your biopsy report after 1 week if you have not already been contacted.  If you experience any problems, such as abnormal amounts of bleeding, swelling, significant bruising, significant pain, or evidence of infection, please call the office immediately.  FOR ADULT SURGERY PATIENTS: If you need something for pain relief you may take 1 extra strength Tylenol (acetaminophen) AND 2 Ibuprofen (200mg  each) together every 4 hours as needed for pain. (do not take these if you are allergic to them or if you have a reason you should not take them.) Typically, you may only need pain medication for 1 to 3 days.   Rosacea:  Start Soolantra cream once daily to face. Start Rhofade every morning to face.   Recommend Ocusoft wash or wipes.       Recommend Lipikar line of La Roche-Posay    Folliculitis:  Start Cephalexin 500 mg 3 times a day for 7 days  Recommend [facility] Hypochlorous Spray (found in the wound care section) OR Cln brand Acne or Sports wash. The [facility] Hypochlorous Spray can be sprayed on daily and left on. The Cln wash should be applied to the affected area daily for at least 30 seconds and then rinsed off. If you are using clindamycin solution or lotion or another topical antibiotic to treat  acne, using a hypochlorous product may help lower the risk of antibiotic resistant bacteria.  Can use Chlorhexidine wash in shower to affected area. Avoid getting in eyes or vaginal area.   Recommend taking Heliocare sun protection supplement daily in sunny weather for additional sun protection. For maximum protection on the sunniest days, you can take up to 2 capsules of regular Heliocare OR take 1 capsule of Heliocare Ultra. For prolonged exposure (such as a full day in the sun), you can repeat your dose of the supplement 4 hours after your first dose. Heliocare can be purchased at [employer], at some [facility] or at [email or web address].     Recommend daily broad spectrum sunscreen SPF 30+ to sun-exposed areas, reapply every 2 hours as needed. Call for new or changing lesions.  Staying in the shade or wearing long sleeves, sun glasses (UVA+UVB protection) and wide brim hats (4-inch brim around the entire circumference of the hat) are also recommended for sun protection.    Melanoma ABCDEs  Melanoma is the most dangerous type of skin cancer, and is the leading cause of death from skin disease.  You are more likely to develop melanoma if you: Have light-colored skin, light-colored eyes, or red or blond hair Spend a lot of time in the sun Tan regularly, either outdoors or in a tanning bed Have had blistering sunburns, especially during childhood Have a close family member who has had a melanoma Have atypical  moles or large birthmarks  Early detection of melanoma is key since treatment is typically straightforward and cure rates are extremely high if we catch it early.   The first sign of melanoma is often a change in a mole or a new dark spot.  The ABCDE system is a way of remembering the signs of melanoma.  A for asymmetry:  The two halves do not match. B for border:  The edges of the growth are irregular. C for color:  A mixture of colors are present instead of an even brown  color. D for diameter:  Melanomas are usually (but not always) greater than 6mm - the size of a pencil eraser. E for evolution:  The spot keeps changing in size, shape, and color.  Please check your skin once per month between visits. You can use a small mirror in front and a large mirror behind you to keep an eye on the back side or your body.   If you see any new or changing lesions before your next follow-up, please call to schedule a visit.  Please continue daily skin protection including broad spectrum sunscreen SPF 30+ to sun-exposed areas, reapplying every 2 hours as needed when you're outdoors.   Staying in the shade or wearing long sleeves, sun glasses (UVA+UVB protection) and wide brim hats (4-inch brim around the entire circumference of the hat) are also recommended for sun protection.     Due to recent changes in healthcare laws, you may see results of your pathology and/or laboratory studies on MyChart before the doctors have had a chance to review them. We understand that in some cases there may be results that are confusing or concerning to you. Please understand that not all results are received at the same time and often the doctors may need to interpret multiple results in order to provide you with the best plan of care or course of treatment. Therefore, we ask that you please give [place] 2 business days to thoroughly review all your results before contacting the office for clarification. Should we see a critical lab result, you will be contacted sooner.   If You Need Anything After Your Visit  If you have any questions or concerns for your doctor, please call our main line at [phone] and press option 4 to reach your doctor's medical assistant. If no one answers, please leave a voicemail as directed and we will return your call as soon as possible. Messages left after 4 pm will be answered the following business day.   You may also send [place] a message via MyChart. We typically  respond to MyChart messages within 1-2 business days.  For prescription refills, please ask your pharmacy to contact our office. Our fax number is [phone].  If you have an urgent issue when the clinic is closed that cannot wait until the next business day, you can page your doctor at the number below.    Please note that while we do our best to be available for urgent issues outside of office hours, we are not available 24/7.   If you have an urgent issue and are unable to reach [place], you may choose to seek medical care at your doctor's office, retail clinic, urgent care center, or emergency room.  If you have a medical emergency, please immediately call 911 or go to the emergency department.  Pager Numbers  - Dr. Gwen Pounds: [phone]  - Dr. Neale Burly: [phone]  - Dr. Roseanne Reno: [phone]  In the event  of inclement weather, please call our main line at [phone] for an update on the status of any delays or closures.  Dermatology Medication Tips: Please keep the boxes that topical medications come in in order to help keep track of the instructions about where and how to use these. Pharmacies typically print the medication instructions only on the boxes and not directly on the medication tubes.   If your medication is too expensive, please contact our office at [phone] option 4 or send [place] a message through MyChart.   We are unable to tell what your co-pay for medications will be in advance as this is different depending on your insurance coverage. However, we may be able to find a substitute medication at lower cost or fill out paperwork to get insurance to cover a needed medication.   If a prior authorization is required to get your medication covered by your insurance company, please allow [place] 1-2 business days to complete this process.  Drug prices often vary depending on where the prescription is filled and some pharmacies may offer cheaper prices.  The website  www.goodrx.com contains coupons for medications through different pharmacies. The prices here do not account for what the cost may be with help from insurance (it may be cheaper with your insurance), but the website can give you the price if you did not use any insurance.  - You can print the associated coupon and take it with your prescription to the pharmacy.  - You may also stop by our office during regular business hours and pick up a GoodRx coupon card.  - If you need your prescription sent electronically to a different pharmacy, notify our office through [facility] or by phone at [phone] option 4.     Si Usted Necesita Algo Despus de Su Visita  Tambin puede enviarnos un mensaje a travs de Clinical cytogeneticist. Por lo general respondemos a los mensajes de MyChart en el transcurso de 1 a 2 das hbiles.  Para renovar recetas, por favor pida a su farmacia que se ponga en contacto con nuestra oficina. Annie Sable de fax es [place] [phone].  Si tiene un asunto urgente cuando la clnica est cerrada y que no puede esperar hasta el siguiente da hbil, puede llamar/localizar a su doctor(a) al nmero que aparece a continuacin.   Por favor, tenga en cuenta que aunque hacemos todo lo posible para estar disponibles para asuntos urgentes fuera del horario de [place], no estamos disponibles las 24 horas del da, los 7 [address]  [address] [place].   Si tiene un problema urgente y no puede comunicarse con nosotros, puede optar por buscar atencin mdica  en el consultorio de su doctor(a), en una clnica privada, en un centro de atencin urgente o en una sala de emergencias.  Si tiene Engineer, drilling, por favor llame inmediatamente al 911 o vaya a la sala de emergencias.  Nmeros de bper  - Dr. Gwen Pounds: [phone]  - Dra. Moye: [phone]  - Dra. Roseanne Reno: [phone]  En caso de inclemencias del [place], por favor llame a Lacy Duverney principal al [phone] para una actualizacin  sobre el [place] de cualquier retraso o cierre.  Consejos para la medicacin en dermatologa: Por favor, guarde las cajas en las que vienen los medicamentos de uso tpico para ayudarle a seguir las instrucciones sobre dnde y cmo usarlos. Las farmacias generalmente imprimen las instrucciones del medicamento slo en las cajas y no directamente en los tubos del [place].   Si su medicamento  es muy caro, por favor, pngase en contacto con Rolm Gala llamando al [phone] y presione la opcin 4 o envenos un mensaje a travs de Clinical cytogeneticist.   No podemos decirle cul ser su copago por los medicamentos por adelantado ya que esto es diferente dependiendo de la cobertura de su seguro. Sin embargo, es posible que podamos encontrar un medicamento sustituto a Audiological scientist un formulario para que el seguro cubra el medicamento que se considera necesario.   Si se requiere una autorizacin previa para que su compaa de seguros [place] su medicamento, por favor permtanos de 1 a 2 das hbiles para completar [address]  Los precios de los medicamentos varan con frecuencia dependiendo del Environmental consultant de dnde se surte la receta y alguna farmacias pueden ofrecer precios ms baratos.  El sitio web www.goodrx.com tiene cupones para medicamentos de Health and safety inspector. Los precios aqu no tienen en cuenta lo que podra costar con la ayuda del seguro (puede ser ms barato con su seguro), pero el sitio web puede darle el precio si no utiliz Tourist information centre manager.  - Puede imprimir el cupn correspondiente y llevarlo con su receta a la farmacia.  - Tambin puede pasar por nuestra oficina durante el horario de atencin regular y Education officer, museum una tarjeta de cupones de GoodRx.  - Si necesita que su receta se enve electrnicamente a una farmacia diferente, informe a nuestra oficina a travs de MyChart de  o por telfono llamando al [phone] y presione la opcin 4.

## 2023-05-03 NOTE — Progress Notes (Signed)
Follow-Up Visit   Subjective  Caroline [name] is a 32 y.o. female who presents for the following: Skin Cancer Screening and Full Body Skin Exam  She has an irritated spot at her left arm  She is bothered by roughness and irritation at her face  She has a rash at her buttocks.  The patient presents for Total-Body Skin Exam (TBSE) for skin cancer screening and mole check. The patient has spots, moles and lesions to be evaluated, some may be new or changing and the patient has concerns that these could be cancer.    The following portions of the chart were reviewed this encounter and updated as appropriate: medications, allergies, medical history  Review of Systems:  No other skin or systemic complaints except as noted in HPI or Assessment and Plan.  Objective  Well appearing patient in no apparent distress; mood and affect are within normal limits.  A full examination was performed including scalp, head, eyes, ears, nose, lips, neck, chest, axillae, abdomen, back, buttocks, bilateral upper extremities, bilateral lower extremities, hands, feet, fingers, toes, fingernails, and toenails. All findings within normal limits unless otherwise noted below.   Relevant physical exam findings are noted in the Assessment and Plan.  Left Antecubital Fossa 0.3 cm erythematous brown papule       Assessment & Plan   HISTORY OF DYSPLASTIC NEVUS. Left upper abdomen, mild atypia. 11/19/2019. No evidence of recurrence today Recommend regular full body skin exams Recommend daily broad spectrum sunscreen SPF 30+ to sun-exposed areas, reapply every 2 hours as needed.  Call if any new or changing lesions are noted between office visits   LENTIGINES, SEBORRHEIC KERATOSES, HEMANGIOMAS - Benign normal skin lesions - Benign-appearing - Call for any changes  MELANOCYTIC NEVI - Tan-brown and/or pink-flesh-colored symmetric macules and papules - Benign appearing on exam today - Observation - Call  clinic for new or changing moles - Recommend daily use of broad spectrum spf 30+ sunscreen to sun-exposed areas.   ACTINIC DAMAGE - Chronic condition, secondary to cumulative UV/sun exposure - diffuse scaly erythematous macules with underlying dyspigmentation - Recommend daily broad spectrum sunscreen SPF 30+ to sun-exposed areas, reapply every 2 hours as needed.  - Staying in the shade or wearing long sleeves, sun glasses (UVA+UVB protection) and wide brim hats (4-inch brim around the entire circumference of the hat) are also recommended for sun protection.  - Call for new or changing lesions.  SKIN CANCER SCREENING PERFORMED TODAY.  FOLLICULITIS Exam: Perifollicular erythematous papules and pustules at buttocks  Treatment Plan: Start Cephalexin 500 mg 3 times a day for 7 days. She is allergic to doxycycline.  Recommend [facility] Hypochlorous Spray (found in the wound care section) OR Cln brand Acne or Sports wash. The [facility] Hypochlorous Spray can be sprayed on daily and left on. The Cln wash should be applied to the affected area daily for at least 30 seconds and then rinsed off. If you are using clindamycin solution or lotion or another topical antibiotic to treat acne, using a hypochlorous product may help lower the risk of antibiotic resistant bacteria.   Can use Chlorhexidine wash in shower to affected area. Avoid getting in eyes or vaginal area.  Bacterial C&S performed today  ROSACEA Exam Mid face erythema with telangiectasias   Chronic and persistent condition with duration or expected duration over one year. Condition is symptomatic/ bothersome to patient. Not currently at goal.  Rosacea is a chronic progressive skin condition usually affecting the face of adults,  causing redness and/or acne bumps. It is treatable but not curable. It sometimes affects the eyes (ocular rosacea) as well. It may respond to topical and/or systemic medication and can flare with stress, sun  exposure, alcohol, exercise, topical steroids (including hydrocortisone/cortisone 10) and some foods.  Daily application of broad spectrum spf 30+ sunscreen to face is recommended to reduce flares.  Patient states has grittiness of the eyes since COVID in 09/2022.  Treatment Plan Start Soolantra cream once daily to face. Start Rhofade every morning to face.   Recommend Ocusoft wash or wipes. Can apply soolantra cream to eyelids, avoiding getting right at the edge or in the eyes.  Recommend Lipikar line of [place] posay   KERATOSIS PILARIS - Inflamed.  Chronic and persistent condition with duration or expected duration over one year. Condition is symptomatic/ bothersome to patient. Not currently at goal.  Tiny follicular keratotic papules Benign. Genetic in nature. No cure.  Treatment:  Start Pimecrolimus cream twice daily to arms.   If desired, patient can use an emollient (moisturizer) containing ammonium lactate (AmLactin), urea or salicylic acid once a day to smooth the area  Recommend starting moisturizer with exfoliant (Urea, Salicylic acid, or Lactic acid) one to two times daily to help smooth rough and bumpy skin.  OTC options include Cetaphil Rough and Bumpy lotion (Urea), Eucerin Roughness Relief lotion or spot treatment cream (Urea), CeraVe SA lotion/cream for Rough and Bumpy skin (Sal Acid), Gold Bond Rough and Bumpy cream (Sal Acid), and AmLactin 12% lotion/cream (Lactic Acid).  If applying in morning, also apply sunscreen to sun-exposed areas, since these exfoliating moisturizers can increase sensitivity to sun.    Neoplasm of skin Left Antecubital Fossa  Epidermal / dermal shaving  Lesion diameter (cm):  0.3 Informed consent: discussed and consent obtained   Patient was prepped and draped in usual sterile fashion: Area prepped with alcohol. Anesthesia: the lesion was anesthetized in a standard fashion   Anesthetic:  1% lidocaine w/ epinephrine 1-100,000 buffered w/  8.4% NaHCO3 Instrument used: flexible razor blade   Hemostasis achieved with: pressure, aluminum chloride and electrodesiccation   Outcome: patient tolerated procedure well   Post-procedure details: wound care instructions given   Post-procedure details comment:  Ointment and small bandage applied  Anatomic Pathology Report   Return in about 1 year (around 05/02/2024) for TBSE.  I, Lawson Radar, CMA, am acting as scribe for Darden Dates, MD.   Documentation: I have reviewed the above documentation for accuracy and completeness, and I agree with the above.  Darden Dates, MD

## 2023-05-03 NOTE — Addendum Note (Signed)
Addended by: Sharin Altidor on: 05/03/2023 10:29 AM   Modules accepted: Level of Service  

## 2023-05-04 DIAGNOSIS — R2689 Other abnormalities of gait and mobility: Secondary | ICD-10-CM | POA: Diagnosis not present

## 2023-05-04 DIAGNOSIS — G9332 Myalgic encephalomyelitis/chronic fatigue syndrome: Secondary | ICD-10-CM | POA: Diagnosis not present

## 2023-05-04 DIAGNOSIS — U099 Post covid-19 condition, unspecified: Secondary | ICD-10-CM | POA: Diagnosis not present

## 2023-05-04 DIAGNOSIS — R42 Dizziness and giddiness: Secondary | ICD-10-CM | POA: Diagnosis not present

## 2023-05-08 ENCOUNTER — Other Ambulatory Visit: Payer: Self-pay | Admitting: Internal Medicine

## 2023-05-09 ENCOUNTER — Telehealth: Payer: Self-pay

## 2023-05-09 DIAGNOSIS — M25512 Pain in left shoulder: Secondary | ICD-10-CM | POA: Diagnosis not present

## 2023-05-09 DIAGNOSIS — M546 Pain in thoracic spine: Secondary | ICD-10-CM | POA: Diagnosis not present

## 2023-05-09 DIAGNOSIS — M25511 Pain in right shoulder: Secondary | ICD-10-CM | POA: Diagnosis not present

## 2023-05-09 DIAGNOSIS — H8111 Benign paroxysmal vertigo, right ear: Secondary | ICD-10-CM | POA: Diagnosis not present

## 2023-05-09 LAB — ANATOMIC PATHOLOGY REPORT

## 2023-05-09 LAB — ANAEROBIC AND AEROBIC CULTURE

## 2023-05-09 NOTE — Telephone Encounter (Signed)
-----   Message from Sandi Mealy, MD sent at 05/09/2023  4:54 PM EDT ----- Culture showed no bacterial growth. No additional treatment needed as long as rash is doing well.   MAs please call. Thank you!

## 2023-05-09 NOTE — Telephone Encounter (Signed)
Discussed C&S results. Patient reports rash is doing much better since switching to CLN wash.

## 2023-05-09 NOTE — Telephone Encounter (Signed)
-----   Message from Sandi Mealy, MD sent at 05/09/2023  8:14 AM EDT ----- Part A-left antecubital fossa ,Skin Biopsy: COMPOUND NEVUS.  This is a NORMAL MOLE. No additional treatment is needed. If you notice any new or changing spots or have other skin concerns in future, please call our office at [phone].      MAs please call. Thank you!

## 2023-05-09 NOTE — Telephone Encounter (Signed)
Discussed pathology results. Patient voiced understanding.  

## 2023-05-10 ENCOUNTER — Ambulatory Visit: Payer: BC Managed Care – PPO | Admitting: Neurology

## 2023-05-10 VITALS — BP 124/78 | HR 95 | Ht 67.0 in | Wt 164.0 lb

## 2023-05-10 DIAGNOSIS — R42 Dizziness and giddiness: Secondary | ICD-10-CM

## 2023-05-10 DIAGNOSIS — H547 Unspecified visual loss: Secondary | ICD-10-CM | POA: Diagnosis not present

## 2023-05-10 DIAGNOSIS — R531 Weakness: Secondary | ICD-10-CM

## 2023-05-10 DIAGNOSIS — H93A3 Pulsatile tinnitus, bilateral: Secondary | ICD-10-CM | POA: Diagnosis not present

## 2023-05-10 DIAGNOSIS — R55 Syncope and collapse: Secondary | ICD-10-CM

## 2023-05-10 NOTE — Progress Notes (Signed)
Full Name: Caroline [name] Gender: Female MRN #: [id number] Date of Birth: [birth year]    Visit Date: 05/10/2023 09:35 Age: 32 Years Examining Physician: Dr. Naomie Dean Referring Physician: Dr. Naomie Dean Height: 5 feet 7 inch     History: Patient reports multiple symptoms of bilateral leg pain and leg weakness, urinary frequency, numbness and tingling in the whole body, neck pain, arm pain, dizziness   Summary: EMG/NCS: performed on the upper and lower limbs. All nerves and muscles (as indicated in the following tables) were within normal limits.    Conclusion: This a normal 4-limb study. No suggestion of large-fiber polyneuropathy, mononeuropathy, radiculopathy of neurogenic muscle disease.  Naomie Dean, M.D.  [facility] [address], [address] [place], [place] [address] Tel: [phone] Fax: [phone]  Verbal informed consent was obtained from the patient, patient was informed of potential risk of procedure, including bruising, bleeding, hematoma formation, infection, muscle weakness, muscle pain, numbness, among others.        MNC    Nerve / Sites Muscle Latency Ref. Amplitude Ref. Rel Amp Segments Distance Velocity Ref. Area    ms ms mV mV %  cm m/s m/s mVms  L Median - APB     Wrist APB 3.0 ?4.4 12.5 ?4.0 100 Wrist - APB 7   47.9     Upper arm APB 7.5  9.4  74.9 Upper arm - Wrist 28 63 ?49 34.8  R Median - APB     Wrist APB 3.3 ?4.4 8.0 ?4.0 100 Wrist - APB 7   25.1     Upper arm APB 8.0  6.8  85.5 Upper arm - Wrist 27 57 ?49 22.9  L Ulnar - ADM     Wrist ADM 2.8 ?3.3 8.3 ?6.0 100 Wrist - ADM 7   34.7     B.Elbow ADM 5.0  7.1  85.5 B.Elbow - Wrist 14 63 ?49 31.7     A.Elbow ADM 7.5  7.3  103 A.Elbow - B.Elbow 14 58 ?49 32.0  R Ulnar - ADM     Wrist ADM 2.8 ?3.3 8.8 ?6.0 100 Wrist - ADM 7   33.4     B.Elbow ADM 4.7  8.4  96.1 B.Elbow - Wrist 12.6 64 ?49 33.3     A.Elbow ADM 7.4  8.7  104 A.Elbow - B.Elbow 16 59 ?49 33.7  L Peroneal -  EDB     Ankle EDB 3.7 ?6.5 7.9 ?2.0 100 Ankle - EDB 9   23.7     Fib head EDB 9.4  7.4  93.9 Fib head - Ankle 28 49 ?44 22.5     Pop fossa EDB 11.3  7.7  104 Pop fossa - Fib head 10 53 ?44 23.5         Pop fossa - Ankle      R Peroneal - EDB     Ankle EDB 4.0 ?6.5 6.1 ?2.0 100 Ankle - EDB 9   21.7     Fib head EDB 9.6  5.9  95.7 Fib head - Ankle 25 45 ?44 22.3     Pop fossa EDB 11.8  4.3  73.4 Pop fossa - Fib head 11 49 ?44 17.1         Pop fossa - Ankle      L Tibial - AH     Ankle AH 3.3 ?5.8 17.7 ?4.0 100 Ankle - AH 9   31.4  Pop fossa AH 11.3  14.0  79.5 Pop fossa - Ankle 37 46 ?41 29.3  R Tibial - AH     Ankle AH 3.5 ?5.8 20.7 ?4.0 100 Ankle - AH 9   38.8     Pop fossa AH 11.7  16.2  78.2 Pop fossa - Ankle 37 45 ?41 37.1                     SNC    Nerve / Sites Rec. Site Peak Lat Ref.  Amp Ref. Segments Distance Peak Diff Ref.    ms ms V V  cm ms ms  L Radial - Anatomical snuff box (Forearm)     Forearm Wrist 2.4 ?2.9 46 ?15 Forearm - Wrist 10    R Radial - Anatomical snuff box (Forearm)     Forearm Wrist 2.4 ?2.9 71 ?15 Forearm - Wrist 10    L Sural - Ankle (Calf)     Calf Ankle 2.9 ?4.4 10 ?6 Calf - Ankle 14    R Sural - Ankle (Calf)     Calf Ankle 3.1 ?4.4 30 ?6 Calf - Ankle 14    L Superficial peroneal - Ankle     Lat leg Ankle 3.7 ?4.4 9 ?6 Lat leg - Ankle 14    R Superficial peroneal - Ankle     Lat leg Ankle 4.1 ?4.4 7 ?6 Lat leg - Ankle 14    L Median, Ulnar - Transcarpal comparison     Median Palm Wrist 1.9 ?2.2 41 ?35 Median Palm - Wrist 8       Ulnar Palm Wrist 1.9 ?2.2 33 ?12 Ulnar Palm - Wrist 8          Median Palm - Ulnar Palm  -0.0 ?0.4  R Median, Ulnar - Transcarpal comparison     Median Palm Wrist 2.0 ?2.2 76 ?35 Median Palm - Wrist 8       Ulnar Palm Wrist 1.9 ?2.2 40 ?12 Ulnar Palm - Wrist 8          Median Palm - Ulnar Palm  0.1 ?0.4  L Median - Orthodromic (Dig II, Mid palm)     Dig II Wrist 2.8 ?3.4 35 ?10 Dig II - Wrist 13    R Median -  Orthodromic (Dig II, Mid palm)     Dig II Wrist 2.9 ?3.4 32 ?10 Dig II - Wrist 13    L Ulnar - Orthodromic, (Dig V, Mid palm)     Dig V Wrist 2.9 ?3.1 21 ?5 Dig V - Wrist 11    R Ulnar - Orthodromic, (Dig V, Mid palm)     Dig V Wrist 2.8 ?3.1 21 ?5 Dig V - Wrist 18                               F  Wave    Nerve F Lat Ref.   ms ms  L Tibial - AH 45.2 ?56.0  L Ulnar - ADM 25.5 ?32.0  R Tibial - AH 47.3 ?56.0  R Ulnar - ADM 26.3 ?32.0             EMG Summary Table    Spontaneous MUAP Recruitment  Muscle IA Fib PSW Fasc Other Amp Dur. Poly Pattern  R. Cervical paraspinals (low) Normal None None None _______ Normal Normal Normal Normal  R. Deltoid Normal None None None _______ Normal  Normal Normal Normal  R. Triceps brachii Normal None None None _______ Normal Normal Normal Normal  R. Biceps brachii Normal None None None _______ Normal Normal Normal Normal  R. Pronator teres Normal None None None _______ Normal Normal Normal Normal  R. First dorsal interosseous Normal None None None _______ Normal Normal Normal Normal  R. Opponens pollicis Normal None None None _______ Normal Normal Normal Normal  R. Lumbar paraspinals (low) Normal None None None _______ Normal Normal Normal Normal  R. Iliopsoas Normal None None None _______ Normal Normal Normal Normal  R. Vastus medialis Normal None None None _______ Normal Normal Normal Normal  R. Tibialis anterior Normal None None None _______ Normal Normal Normal Normal  R. Gastrocnemius (Medial head) Normal None None None _______ Normal Normal Normal Normal  R. Extensor hallucis longus Normal None None None _______ Normal Normal Normal Normal  R. Abductor hallucis Normal None None None _______ Normal Normal Normal Normal  R. Biceps femoris (long head) Normal None None None _______ Normal Normal Normal Normal  R. Gluteus maximus Normal None None None _______ Normal Normal Normal Normal  R. Gluteus medius Normal None None None _______ Normal Normal  Normal Normal  L. Thoracic paraspinals (mid) Normal None None None _______ Normal Normal Normal Normal  R. Thoracic paraspinals (mid) Normal None None None _______ Normal Normal Normal Normal  L. Cervical paraspinals (low) Normal None None None _______ Normal Normal Normal Normal  L. Lumbar paraspinals (low) Normal None None None _______ Normal Normal Normal Normal

## 2023-05-11 DIAGNOSIS — M25512 Pain in left shoulder: Secondary | ICD-10-CM | POA: Diagnosis not present

## 2023-05-11 DIAGNOSIS — M25511 Pain in right shoulder: Secondary | ICD-10-CM | POA: Diagnosis not present

## 2023-05-11 DIAGNOSIS — H8111 Benign paroxysmal vertigo, right ear: Secondary | ICD-10-CM | POA: Diagnosis not present

## 2023-05-11 DIAGNOSIS — M546 Pain in thoracic spine: Secondary | ICD-10-CM | POA: Diagnosis not present

## 2023-05-14 ENCOUNTER — Encounter: Payer: Self-pay | Admitting: Neurology

## 2023-05-14 NOTE — Procedures (Signed)
Full Name: Caroline [name] Gender: Female MRN #: [id number] Date of Birth: [birth year]    Visit Date: 05/10/2023 09:35 Age: 32 Years Examining Physician: Dr. Naomie Dean Referring Physician: Dr. Naomie Dean Height: 5 feet 7 inch     History: Patient reports multiple symptoms of bilateral leg pain and leg weakness, urinary frequency, numbness and tingling in the whole body, neck pain, arm pain, dizziness   Summary: EMG/NCS: performed on the upper and lower limbs. All nerves and muscles (as indicated in the following tables) were within normal limits.    Conclusion: This a normal 4-limb study. No suggestion of large-fiber polyneuropathy, mononeuropathy, radiculopathy of neurogenic muscle disease.  Naomie Dean, M.D.  [facility] [address], [address] [place], [place] [address] Tel: [phone] Fax: [phone]  Verbal informed consent was obtained from the patient, patient was informed of potential risk of procedure, including bruising, bleeding, hematoma formation, infection, muscle weakness, muscle pain, numbness, among others.        MNC    Nerve / Sites Muscle Latency Ref. Amplitude Ref. Rel Amp Segments Distance Velocity Ref. Area    ms ms mV mV %  cm m/s m/s mVms  L Median - APB     Wrist APB 3.0 ?4.4 12.5 ?4.0 100 Wrist - APB 7   47.9     Upper arm APB 7.5  9.4  74.9 Upper arm - Wrist 28 63 ?49 34.8  R Median - APB     Wrist APB 3.3 ?4.4 8.0 ?4.0 100 Wrist - APB 7   25.1     Upper arm APB 8.0  6.8  85.5 Upper arm - Wrist 27 57 ?49 22.9  L Ulnar - ADM     Wrist ADM 2.8 ?3.3 8.3 ?6.0 100 Wrist - ADM 7   34.7     B.Elbow ADM 5.0  7.1  85.5 B.Elbow - Wrist 14 63 ?49 31.7     A.Elbow ADM 7.5  7.3  103 A.Elbow - B.Elbow 14 58 ?49 32.0  R Ulnar - ADM     Wrist ADM 2.8 ?3.3 8.8 ?6.0 100 Wrist - ADM 7   33.4     B.Elbow ADM 4.7  8.4  96.1 B.Elbow - Wrist 12.6 64 ?49 33.3     A.Elbow ADM 7.4  8.7  104 A.Elbow - B.Elbow 16 59 ?49 33.7  L Peroneal -  EDB     Ankle EDB 3.7 ?6.5 7.9 ?2.0 100 Ankle - EDB 9   23.7     Fib head EDB 9.4  7.4  93.9 Fib head - Ankle 28 49 ?44 22.5     Pop fossa EDB 11.3  7.7  104 Pop fossa - Fib head 10 53 ?44 23.5         Pop fossa - Ankle      R Peroneal - EDB     Ankle EDB 4.0 ?6.5 6.1 ?2.0 100 Ankle - EDB 9   21.7     Fib head EDB 9.6  5.9  95.7 Fib head - Ankle 25 45 ?44 22.3     Pop fossa EDB 11.8  4.3  73.4 Pop fossa - Fib head 11 49 ?44 17.1         Pop fossa - Ankle      L Tibial - AH     Ankle AH 3.3 ?5.8 17.7 ?4.0 100 Ankle - AH 9   31.4  Pop fossa AH 11.3  14.0  79.5 Pop fossa - Ankle 37 46 ?41 29.3  R Tibial - AH     Ankle AH 3.5 ?5.8 20.7 ?4.0 100 Ankle - AH 9   38.8     Pop fossa AH 11.7  16.2  78.2 Pop fossa - Ankle 37 45 ?41 37.1                     SNC    Nerve / Sites Rec. Site Peak Lat Ref.  Amp Ref. Segments Distance Peak Diff Ref.    ms ms V V  cm ms ms  L Radial - Anatomical snuff box (Forearm)     Forearm Wrist 2.4 ?2.9 46 ?15 Forearm - Wrist 10    R Radial - Anatomical snuff box (Forearm)     Forearm Wrist 2.4 ?2.9 71 ?15 Forearm - Wrist 10    L Sural - Ankle (Calf)     Calf Ankle 2.9 ?4.4 10 ?6 Calf - Ankle 14    R Sural - Ankle (Calf)     Calf Ankle 3.1 ?4.4 30 ?6 Calf - Ankle 14    L Superficial peroneal - Ankle     Lat leg Ankle 3.7 ?4.4 9 ?6 Lat leg - Ankle 14    R Superficial peroneal - Ankle     Lat leg Ankle 4.1 ?4.4 7 ?6 Lat leg - Ankle 14    L Median, Ulnar - Transcarpal comparison     Median Palm Wrist 1.9 ?2.2 41 ?35 Median Palm - Wrist 8       Ulnar Palm Wrist 1.9 ?2.2 33 ?12 Ulnar Palm - Wrist 8          Median Palm - Ulnar Palm  -0.0 ?0.4  R Median, Ulnar - Transcarpal comparison     Median Palm Wrist 2.0 ?2.2 76 ?35 Median Palm - Wrist 8       Ulnar Palm Wrist 1.9 ?2.2 40 ?12 Ulnar Palm - Wrist 8          Median Palm - Ulnar Palm  0.1 ?0.4  L Median - Orthodromic (Dig II, Mid palm)     Dig II Wrist 2.8 ?3.4 35 ?10 Dig II - Wrist 13    R Median -  Orthodromic (Dig II, Mid palm)     Dig II Wrist 2.9 ?3.4 32 ?10 Dig II - Wrist 13    L Ulnar - Orthodromic, (Dig V, Mid palm)     Dig V Wrist 2.9 ?3.1 21 ?5 Dig V - Wrist 11    R Ulnar - Orthodromic, (Dig V, Mid palm)     Dig V Wrist 2.8 ?3.1 21 ?5 Dig V - Wrist 18                               F  Wave    Nerve F Lat Ref.   ms ms  L Tibial - AH 45.2 ?56.0  L Ulnar - ADM 25.5 ?32.0  R Tibial - AH 47.3 ?56.0  R Ulnar - ADM 26.3 ?32.0             EMG Summary Table    Spontaneous MUAP Recruitment  Muscle IA Fib PSW Fasc Other Amp Dur. Poly Pattern  R. Cervical paraspinals (low) Normal None None None _______ Normal Normal Normal Normal  R. Deltoid Normal None None None _______ Normal  Normal Normal Normal  R. Triceps brachii Normal None None None _______ Normal Normal Normal Normal  R. Biceps brachii Normal None None None _______ Normal Normal Normal Normal  R. Pronator teres Normal None None None _______ Normal Normal Normal Normal  R. First dorsal interosseous Normal None None None _______ Normal Normal Normal Normal  R. Opponens pollicis Normal None None None _______ Normal Normal Normal Normal  R. Lumbar paraspinals (low) Normal None None None _______ Normal Normal Normal Normal  R. Iliopsoas Normal None None None _______ Normal Normal Normal Normal  R. Vastus medialis Normal None None None _______ Normal Normal Normal Normal  R. Tibialis anterior Normal None None None _______ Normal Normal Normal Normal  R. Gastrocnemius (Medial head) Normal None None None _______ Normal Normal Normal Normal  R. Extensor hallucis longus Normal None None None _______ Normal Normal Normal Normal  R. Abductor hallucis Normal None None None _______ Normal Normal Normal Normal  R. Biceps femoris (long head) Normal None None None _______ Normal Normal Normal Normal  R. Gluteus maximus Normal None None None _______ Normal Normal Normal Normal  R. Gluteus medius Normal None None None _______ Normal Normal  Normal Normal  L. Thoracic paraspinals (mid) Normal None None None _______ Normal Normal Normal Normal  R. Thoracic paraspinals (mid) Normal None None None _______ Normal Normal Normal Normal  L. Cervical paraspinals (low) Normal None None None _______ Normal Normal Normal Normal  L. Lumbar paraspinals (low) Normal None None None _______ Normal Normal Normal Normal

## 2023-05-14 NOTE — Progress Notes (Signed)
Patient is here for emg/ncs. While performing test, she reports new symptoms. Has new insurance in June. Episoes of vision loss, pre-syncope, pulsatile tinnitus in the ears,positionl vision loss, dizziness worse with positionand head movements, Needs to go to [facility] for CTA/H&N.we discussed symptoms, never before had her arteries images, will order in June.  I spent 20 minutes of face-to-face and non-face-to-face time with patient on the  1. Vision loss   2. Weakness   3. Pre-syncope   4. Pulsatile tinnitus of both ears   5. Dizziness after extension of neck    diagnosis.  This included previsit chart review, lab review, study review, order entry, electronic health record documentation, patient education on the different diagnostic and therapeutic options, counseling and coordination of care, risks and benefits of management, compliance, or risk factor reduction. This does not include time spent on emg/ncs procedure, neuro exam non focal,  Exam: NAD, pleasant                  Speech:    Speech is normal; fluent and spontaneous with normal comprehension.  Cognition:    The patient is oriented to person, place, and time;     recent and remote memory intact;     language fluent;    Cranial Nerves:    The pupils are equal, round, and reactive to light.Trigeminal sensation is intact and the muscles of mastication are normal. The face is symmetric. The palate elevates in the midline. Hearing intact. Voice is normal. Shoulder shrug is normal. The tongue has normal motion without fasciculations.   Coordination:  No dysmetria  Motor Observation:    No asymmetry, no atrophy, and no involuntary movements noted. Tone:    Normal muscle tone.     Strength:    Strength is V/V in the upper and lower limbs.      Sensation: intact to LT

## 2023-05-17 DIAGNOSIS — R42 Dizziness and giddiness: Secondary | ICD-10-CM | POA: Diagnosis not present

## 2023-05-28 NOTE — Telephone Encounter (Signed)
Updated insurance info into chart and scanned in card.

## 2023-05-28 NOTE — Telephone Encounter (Signed)
[employer] Berkley Harvey: [id number] exp. 05/28/23-06/26/23 sent to GI [phone]

## 2023-06-05 ENCOUNTER — Other Ambulatory Visit: Payer: Self-pay

## 2023-06-05 MED ORDER — MOTEGRITY 1 MG PO TABS: 1.0000 mg | ORAL_TABLET | Freq: Every day | ORAL | 0 refills | Status: DC

## 2023-06-05 NOTE — Telephone Encounter (Signed)
I have sent a refill for her Motegrity to [facility] per patient request. It said it was not on her formulary so when they kick it back we will forward to our prior authorization team to work on it.

## 2023-06-08 ENCOUNTER — Ambulatory Visit
Admission: RE | Admit: 2023-06-08 | Discharge: 2023-06-08 | Disposition: A | Discharge status: Home / Self Care | Payer: BC Managed Care – PPO | Source: Ambulatory Visit | Attending: Neurology | Admitting: Neurology

## 2023-06-08 ENCOUNTER — Other Ambulatory Visit: Payer: BC Managed Care – PPO

## 2023-06-08 DIAGNOSIS — R531 Weakness: Secondary | ICD-10-CM

## 2023-06-08 DIAGNOSIS — R55 Syncope and collapse: Secondary | ICD-10-CM

## 2023-06-08 DIAGNOSIS — H547 Unspecified visual loss: Secondary | ICD-10-CM | POA: Diagnosis not present

## 2023-06-08 DIAGNOSIS — H93A3 Pulsatile tinnitus, bilateral: Secondary | ICD-10-CM

## 2023-06-08 DIAGNOSIS — R42 Dizziness and giddiness: Secondary | ICD-10-CM

## 2023-06-08 DIAGNOSIS — H93A9 Pulsatile tinnitus, unspecified ear: Secondary | ICD-10-CM | POA: Diagnosis not present

## 2023-06-08 MED ORDER — IOPAMIDOL (ISOVUE-370) INJECTION 76%
75.0000 mL | Freq: Once | INTRAVENOUS | Status: AC | PRN
  Administered 2023-06-08: 75 mL via INTRAVENOUS

## 2023-06-08 MED ORDER — MOTEGRITY 1 MG PO TABS: 1.0000 mg | ORAL_TABLET | Freq: Every day | ORAL | 0 refills | Status: DC

## 2023-06-08 NOTE — Addendum Note (Signed)
Addended by: [place], Nabeel Gladson E on: 06/08/2023 09:20 AM   Modules accepted: Orders

## 2023-06-08 NOTE — Telephone Encounter (Signed)
PT has been trying to get her Motegrity refilled. [facility] needs a peer to peer. She wont be able to get her RX until a later date and will need a 30day supply sent to [facility]. [employer] [address].  In [place].  Please contact the following regarding peer to peer: Prior Authorization # [id number] [phone] Doctor Clarification line 1 [phone]

## 2023-06-08 NOTE — Telephone Encounter (Signed)
I called and left Caroline [name] a voicemail message that we got her message and will work on this as soon as we can. I sent in her 30 day supply of Motegrity to the [facility] per her request.

## 2023-06-13 ENCOUNTER — Telehealth: Payer: Self-pay | Admitting: Internal Medicine

## 2023-06-13 NOTE — Telephone Encounter (Signed)
Inbound call from patient stating she recently started new insurance and is now requiring a prior authorization for Motegrity medication. States the phone number to call for prior auth is [phone]. Also stated they told her to advise [place] to place it as urgent. States she is taking her last pill today. Requesting a call back once authorized. Please advise, thank you.

## 2023-06-14 ENCOUNTER — Other Ambulatory Visit (HOSPITAL_COMMUNITY): Payer: Self-pay

## 2023-06-14 NOTE — Telephone Encounter (Signed)
I called - a fax is coming today and we will have to fill out and send notes  Get it to me

## 2023-06-14 NOTE — Telephone Encounter (Signed)
Touching base on this peer to peer case Sir.

## 2023-06-14 NOTE — Telephone Encounter (Signed)
PA was already submitted by another source and status is pending. Unable to see updated in CoverMyMeds.

## 2023-06-14 NOTE — Telephone Encounter (Signed)
Dr Leone Payor has filled out the form and it has been faxed back to [facility] at [phone].

## 2023-06-14 NOTE — Telephone Encounter (Signed)
Inbound call from patient stating she has been trying to get into contact with [place] regarding prior authorization. States she ran out of medication yesterday. Requesting a urgent call back regarding medication. Please advise, thank you.

## 2023-06-14 NOTE — Telephone Encounter (Signed)
Caroline [name] is calling today to check on this request.

## 2023-06-14 NOTE — Telephone Encounter (Signed)
I spoke with Caroline [name] and told her we have faxed the forms to [facility] to try and get her Motegrity covered. She said today she just went and got the 30 days worth because she can't do without it. It cost her over $500 with a coupon from the pharmacy.

## 2023-06-15 DIAGNOSIS — R32 Unspecified urinary incontinence: Secondary | ICD-10-CM | POA: Diagnosis not present

## 2023-06-15 DIAGNOSIS — B3731 Acute candidiasis of vulva and vagina: Secondary | ICD-10-CM | POA: Diagnosis not present

## 2023-06-15 NOTE — Telephone Encounter (Signed)
I spoke to Caroline [name] last night and told her Dr Leone Payor has filled out paperwork and it was faxed back yesterday to try and get her motegrity. She went yesterday and paid over $500 for a 30 day supply because she can't be without it.

## 2023-06-16 ENCOUNTER — Encounter: Payer: Self-pay | Admitting: Neurology

## 2023-06-18 ENCOUNTER — Other Ambulatory Visit: Payer: Self-pay | Admitting: Neurology

## 2023-06-18 DIAGNOSIS — H04123 Dry eye syndrome of bilateral lacrimal glands: Secondary | ICD-10-CM | POA: Diagnosis not present

## 2023-06-18 DIAGNOSIS — I671 Cerebral aneurysm, nonruptured: Secondary | ICD-10-CM

## 2023-06-21 ENCOUNTER — Ambulatory Visit: Payer: BC Managed Care – PPO

## 2023-06-21 NOTE — Telephone Encounter (Signed)
I spoke with Caroline [name] and she said she got a voicemail message yesterday and it said her rx had been denied. She is going to call them and see what they say. I told her I haven't seen a fax about this. She is thinking about setting up an account thru [place].

## 2023-06-21 NOTE — Telephone Encounter (Signed)
Patient called stating that she would like a call back to discuss the next steps for her medication. Patient stated that she would like to know more details about the [place] pharmacy. Please advise.

## 2023-06-21 NOTE — Telephone Encounter (Signed)
See 06/13/2023 medication refill note on epic for more information.

## 2023-06-22 ENCOUNTER — Encounter: Payer: Self-pay | Admitting: Internal Medicine

## 2023-06-22 NOTE — Telephone Encounter (Signed)
I spoke with Tresa Endo at  [employer]  (phone # [phone]) appeal department and we have started an appeal case # [id number]. I will ask Dr Leone Payor to provide a letter for me to fax to them at fax # [phone].   I called Brynda to update her that they said she has to have tried /failed [employer] and [employer]. She said that her and Dr Leone Payor talked about trying that but didn't because she had such an adverse reaction to the Linzess (abdominal pain and bloating).  It is a 15 day turn around [place] at [employer] said. Matrice is going to call me back next Wednesday for an update.

## 2023-06-22 NOTE — Telephone Encounter (Signed)
Appeal letter faxed.

## 2023-06-22 NOTE — Telephone Encounter (Signed)
As of today we are working on an appeal for this through [employer].

## 2023-06-22 NOTE — Telephone Encounter (Signed)
Appeal letter faxed and we got confirmation it went through.

## 2023-06-22 NOTE — Telephone Encounter (Signed)
She spoke with [facility] they advised her she has not tried Linzess in which she states she has but that is where the denial is coming from. So she would like for you to possible let them know she has tried the medication for a while. Please advise.

## 2023-06-25 ENCOUNTER — Telehealth: Payer: Self-pay | Admitting: Neurology

## 2023-06-25 NOTE — Telephone Encounter (Signed)
I placed a referral to IR for this patient. Who is doing referrals now? I think they have to contact Karle Barr his PA when I place a referral?

## 2023-06-26 ENCOUNTER — Telehealth (HOSPITAL_COMMUNITY): Payer: Self-pay

## 2023-06-26 ENCOUNTER — Ambulatory Visit: Payer: BC Managed Care – PPO

## 2023-06-26 ENCOUNTER — Other Ambulatory Visit (HOSPITAL_COMMUNITY): Payer: Self-pay | Admitting: Interventional Radiology

## 2023-06-26 DIAGNOSIS — H93A9 Pulsatile tinnitus, unspecified ear: Secondary | ICD-10-CM

## 2023-06-26 DIAGNOSIS — R531 Weakness: Secondary | ICD-10-CM

## 2023-06-26 DIAGNOSIS — R42 Dizziness and giddiness: Secondary | ICD-10-CM

## 2023-06-26 NOTE — Telephone Encounter (Signed)
I sent a message to [facility] regarding referral.

## 2023-06-26 NOTE — Telephone Encounter (Signed)
Called to schedule consult, no answer, left vm. AB  

## 2023-06-27 ENCOUNTER — Telehealth: Payer: Self-pay

## 2023-06-27 DIAGNOSIS — R32 Unspecified urinary incontinence: Secondary | ICD-10-CM

## 2023-06-27 NOTE — Telephone Encounter (Signed)
We received a fax that her Motegrity 1mg  tablets have been approved thru 06/26/2024. I called and informed her. She nor I know the cost.

## 2023-06-27 NOTE — Telephone Encounter (Signed)
I called Jovi to let her know that her Motegrity 1mg  has been approved thru 06/26/2024. While on the phone she asked that I ask Dr Leone Payor if Motegrity can cause urinary incontinence. It has been going on for at least 2 weeks. She is having to wear a panty liner. She saw her GYN who ruled out a UTI but gave her a short course of antibiotics. She said the Motegrity really helps her and she wants to know if it is causing these issues what can she do so she can continue the Motegrity. Please advise Sir.

## 2023-06-27 NOTE — Telephone Encounter (Signed)
Motegrity approved thru 06/26/2024. Lilias aware.

## 2023-06-29 ENCOUNTER — Ambulatory Visit (HOSPITAL_COMMUNITY)
Admission: RE | Admit: 2023-06-29 | Discharge: 2023-06-29 | Disposition: A | Discharge status: Home / Self Care | Payer: BC Managed Care – PPO | Source: Ambulatory Visit | Attending: Interventional Radiology | Admitting: Interventional Radiology

## 2023-06-29 DIAGNOSIS — R531 Weakness: Secondary | ICD-10-CM

## 2023-06-29 DIAGNOSIS — I671 Cerebral aneurysm, nonruptured: Secondary | ICD-10-CM | POA: Diagnosis not present

## 2023-06-29 DIAGNOSIS — R42 Dizziness and giddiness: Secondary | ICD-10-CM

## 2023-06-29 DIAGNOSIS — H93A9 Pulsatile tinnitus, unspecified ear: Secondary | ICD-10-CM

## 2023-06-29 NOTE — Telephone Encounter (Signed)
Spoke to patient.  Motegrity causes increased bladder motility as well with frequent urination so that could be driving the minor incontinence she is having.  #1 she will try every other day dosing to see if that reduces the incontinence but adequately treats her constipation  #2 please refer to [facility] urogynecology-woman provider (I think they are all women) regarding urinary incontinence

## 2023-07-02 HISTORY — PX: IR RADIOLOGIST EVAL & MGMT: IMG5224

## 2023-07-02 NOTE — Telephone Encounter (Signed)
I have placed the referral to [facility]. I spoke to them at [phone] and she said to fax them her information and they will contact her. I also placed the order in epic. Their fax # is [phone]. I put on the cover sheet that she request a female provider.

## 2023-07-03 NOTE — Telephone Encounter (Signed)
I spoke with Caroline [name] and she has an appointment set up for the [facility] location to see Dr Clelia Croft on August 8th.

## 2023-07-04 ENCOUNTER — Telehealth (HOSPITAL_COMMUNITY): Payer: Self-pay

## 2023-07-04 NOTE — Telephone Encounter (Signed)
Called to schedule angiogram, no answer, left vm. AB 

## 2023-07-05 ENCOUNTER — Ambulatory Visit: Payer: BC Managed Care – PPO

## 2023-07-09 ENCOUNTER — Telehealth (HOSPITAL_COMMUNITY): Payer: Self-pay

## 2023-07-09 NOTE — Telephone Encounter (Signed)
Pt will discuss angiogram with her family and call back if she decides to schedule. AB

## 2023-07-12 ENCOUNTER — Ambulatory Visit: Payer: BC Managed Care – PPO

## 2023-07-17 ENCOUNTER — Telehealth (HOSPITAL_COMMUNITY): Payer: Self-pay

## 2023-07-17 ENCOUNTER — Other Ambulatory Visit (HOSPITAL_COMMUNITY): Payer: Self-pay | Admitting: Interventional Radiology

## 2023-07-17 DIAGNOSIS — R42 Dizziness and giddiness: Secondary | ICD-10-CM

## 2023-07-17 DIAGNOSIS — H93A9 Pulsatile tinnitus, unspecified ear: Secondary | ICD-10-CM

## 2023-07-17 DIAGNOSIS — R531 Weakness: Secondary | ICD-10-CM

## 2023-07-17 NOTE — Telephone Encounter (Signed)
Called to schedule angiogram, no answer, left vm. AB 

## 2023-07-19 ENCOUNTER — Ambulatory Visit: Payer: BC Managed Care – PPO

## 2023-07-26 ENCOUNTER — Ambulatory Visit: Payer: BC Managed Care – PPO

## 2023-08-02 ENCOUNTER — Ambulatory Visit: Payer: BC Managed Care – PPO

## 2023-08-02 ENCOUNTER — Other Ambulatory Visit: Payer: Self-pay | Admitting: Radiology

## 2023-08-02 DIAGNOSIS — H93A9 Pulsatile tinnitus, unspecified ear: Secondary | ICD-10-CM

## 2023-08-03 ENCOUNTER — Ambulatory Visit (HOSPITAL_COMMUNITY)
Admission: RE | Admit: 2023-08-03 | Discharge: 2023-08-03 | Disposition: A | Discharge status: Home / Self Care | Payer: BC Managed Care – PPO | Source: Ambulatory Visit | Attending: Interventional Radiology | Admitting: Interventional Radiology

## 2023-08-03 ENCOUNTER — Other Ambulatory Visit: Payer: Self-pay

## 2023-08-03 ENCOUNTER — Other Ambulatory Visit (HOSPITAL_COMMUNITY): Payer: Self-pay | Admitting: Interventional Radiology

## 2023-08-03 ENCOUNTER — Encounter (HOSPITAL_COMMUNITY): Payer: Self-pay

## 2023-08-03 VITALS — BP 118/69 | HR 87 | Temp 97.8°F | Resp 13 | Ht 67.0 in | Wt 153.0 lb

## 2023-08-03 DIAGNOSIS — R531 Weakness: Secondary | ICD-10-CM | POA: Diagnosis not present

## 2023-08-03 DIAGNOSIS — R42 Dizziness and giddiness: Secondary | ICD-10-CM

## 2023-08-03 DIAGNOSIS — H93A9 Pulsatile tinnitus, unspecified ear: Secondary | ICD-10-CM | POA: Diagnosis not present

## 2023-08-03 DIAGNOSIS — Z9889 Other specified postprocedural states: Secondary | ICD-10-CM

## 2023-08-03 DIAGNOSIS — R930 Abnormal findings on diagnostic imaging of skull and head, not elsewhere classified: Secondary | ICD-10-CM | POA: Diagnosis not present

## 2023-08-03 HISTORY — PX: IR 3D INDEPENDENT WKST: IMG2385

## 2023-08-03 HISTORY — PX: IR ANGIO INTRA EXTRACRAN SEL COM CAROTID INNOMINATE BILAT MOD SED: IMG5360

## 2023-08-03 HISTORY — PX: IR ANGIO VERTEBRAL SEL VERTEBRAL BILAT MOD SED: IMG5369

## 2023-08-03 HISTORY — PX: IR US GUIDE VASC ACCESS RIGHT: IMG2390

## 2023-08-03 LAB — PROTIME-INR
INR: 1 (ref 0.8–1.2)
Prothrombin Time: 13 seconds (ref 11.4–15.2)

## 2023-08-03 LAB — BASIC METABOLIC PANEL
Anion gap: 10 (ref 5–15)
BUN: 10 mg/dL (ref 6–20)
CO2: 23 mmol/L (ref 22–32)
Calcium: 9 mg/dL (ref 8.9–10.3)
Chloride: 104 mmol/L (ref 98–111)
Creatinine, Ser: 0.67 mg/dL (ref 0.44–1.00)
GFR, Estimated: >60 mL/min (ref >60)
Glucose, Bld: 91 mg/dL (ref 70–99)
Potassium: 3.5 mmol/L (ref 3.5–5.1)
Sodium: 137 mmol/L (ref 135–145)

## 2023-08-03 LAB — CBC
HCT: 35.7 % — ABNORMAL LOW (ref 36.0–46.0)
Hemoglobin: 11.7 g/dL — ABNORMAL LOW (ref 12.0–15.0)
MCH: 27.5 pg (ref 26.0–34.0)
MCHC: 32.8 g/dL (ref 30.0–36.0)
MCV: 83.8 fL (ref 80.0–100.0)
Platelets: 247 10*3/uL (ref 150–400)
RBC: 4.26 MIL/uL (ref 3.87–5.11)
RDW: 12.7 % (ref 11.5–15.5)
WBC: 6.1 10*3/uL (ref 4.0–10.5)
nRBC: 0 % (ref 0.0–0.2)

## 2023-08-03 LAB — PREGNANCY, URINE: Preg Test, Ur: NEGATIVE

## 2023-08-03 MED ORDER — NITROGLYCERIN 1 MG/10 ML FOR IR/CATH LAB
INTRA_ARTERIAL | Status: AC
  Filled 2023-08-03: qty 10

## 2023-08-03 MED ORDER — SODIUM CHLORIDE 0.9 % IV SOLN: INTRAVENOUS | Status: AC

## 2023-08-03 MED ORDER — FENTANYL CITRATE (PF) 100 MCG/2ML IJ SOLN
INTRAMUSCULAR | Status: AC
  Filled 2023-08-03: qty 2

## 2023-08-03 MED ORDER — MIDAZOLAM HCL 2 MG/2ML IJ SOLN
INTRAMUSCULAR | Status: AC | PRN
  Administered 2023-08-03: 1 mg via INTRAVENOUS

## 2023-08-03 MED ORDER — FENTANYL CITRATE (PF) 100 MCG/2ML IJ SOLN
INTRAMUSCULAR | Status: AC | PRN
  Administered 2023-08-03 (×3): 12.5 ug via INTRAVENOUS

## 2023-08-03 MED ORDER — NITROGLYCERIN 1 MG/10 ML FOR IR/CATH LAB
INTRA_ARTERIAL | Status: AC | PRN
  Administered 2023-08-03 (×2): 200 ug via INTRA_ARTERIAL

## 2023-08-03 MED ORDER — IOHEXOL 300 MG/ML  SOLN
150.0000 mL | Freq: Once | INTRAMUSCULAR | Status: AC | PRN
  Administered 2023-08-03: 80 mL via INTRA_ARTERIAL

## 2023-08-03 MED ORDER — LIDOCAINE HCL 1 % IJ SOLN
INTRAMUSCULAR | Status: AC
  Filled 2023-08-03: qty 20

## 2023-08-03 MED ORDER — SODIUM CHLORIDE 0.9 % IV SOLN: Freq: Once | INTRAVENOUS | Status: AC

## 2023-08-03 MED ORDER — HEPARIN SODIUM (PORCINE) 1000 UNIT/ML IJ SOLN
INTRAMUSCULAR | Status: AC
  Filled 2023-08-03: qty 10

## 2023-08-03 MED ORDER — SODIUM CHLORIDE 0.9 % IV SOLN
INTRAVENOUS | Status: AC | PRN
  Administered 2023-08-03: 250 mL via INTRAVENOUS

## 2023-08-03 MED ORDER — VERAPAMIL HCL 2.5 MG/ML IV SOLN
INTRAVENOUS | Status: AC
  Filled 2023-08-03: qty 2

## 2023-08-03 MED ORDER — MIDAZOLAM HCL 2 MG/2ML IJ SOLN
INTRAMUSCULAR | Status: AC
  Filled 2023-08-03: qty 2

## 2023-08-03 MED ORDER — IOHEXOL 300 MG/ML  SOLN
100.0000 mL | Freq: Once | INTRAMUSCULAR | Status: AC | PRN
  Administered 2023-08-03: 35 mL via INTRA_ARTERIAL

## 2023-08-03 MED ORDER — VERAPAMIL HCL 2.5 MG/ML IV SOLN
INTRA_ARTERIAL | Status: AC | PRN
  Administered 2023-08-03: 6 mL via INTRA_ARTERIAL

## 2023-08-03 MED ORDER — LIDOCAINE HCL 1 % IJ SOLN
20.0000 mL | Freq: Once | INTRAMUSCULAR | Status: AC
  Administered 2023-08-03: 4 mL via INTRADERMAL

## 2023-08-03 NOTE — H&P (Signed)
Chief Complaint: Patient was seen in consultation today for Cerebral arteriogram to evaluate tinnitus; vertigo at the request of Dr Azell Der  Referring Physician(s): Julieanne Cotton  Supervising Physician: Julieanne Cotton  Patient Status: [facility] - Out-pt  History of Present Illness: Caroline [name] is a 32 y.o. female   FULL Code status per pt Pt has had a myriad of symptoms since Covid infection October 2023 Dizziness; Bilateral tinnitus; vision changes Fatigue; vertigo; occasional shortness of breath Was referred to Dr Corliss Skains after Neuro MD had ordered CTA which was abnormal 06/15/23: IMPRESSION: 1. No evidence of acute intracranial abnormality. 2. No large vessel occlusion, significant stenosis, or dissection in the head and neck. 3. 2 mm aneurysm versus infundibulum at the left AICA origin.  Noted was possible aneurysm Dr Corliss Skains consult 06/29/23: The patient is aware of a 2 mm outpouching of the basilar artery at the level of the anterior-inferior cerebellar artery. It is unclear whether this is an infundibulum, a vascular loop, or a true aneurysm. For more accurate analysis to verify whether this does represent a brain aneurysm, a formal diagnostic catheter arteriogram is recommended. The procedure was described in detail. This would be under conscious sedation as an outpatient. During the procedure either via trans radial or transfemoral route, a diagnostic catheter will be advanced and positioned at the origins of the vertebral arteries and the common carotid arteries followed by injection of contrast.  Scheduled now for Cerebral arteriogram    Past Medical History:  Diagnosis Date   Chronic fatigue 06/26/2018   Colonic inertia 2007   Dr Arvid Right (GI [facility])   CONSTIPATION, CHRONIC 05/30/2010   H/o colonic inertia   COVID-19    COVID-19 virus infection 10/24/2022   Dysplastic nevus 11/19/2019   Left upper abdomen. Mild atypia.   High blood copper  level 10/07/2021   Unclear cause.   She had home water tested with normal results.   Ceruloplasmin when checked was not low.   24 hour urine copper levels were normal.   Managing with zinc and low copper diet.   Saw heme - ?GI issue, planned GI f/u.    History of chicken pox    Hives 08/16/2021   Urticaria to OxyCide cleaner   Influenza B 11/19/2018   Orthostatic intolerance 06/20/2019   Paroxysmal tachycardia (HCC) 05/12/2021   S/p cardiac eval (Dr [place], Dr Mariah Milling)   Seasonal allergic rhinitis    White matter abnormality on MRI of brain 01/31/2016   Small T2/FLAIR hyperintense focus in the deep white matter of the right frontal lobe, unchanged in appearance when compared to the 12/14/2015 MRI. This is a nonspecific finding and is unlikely to be of clinical significance in isolation. 2mm (11/2016)    Past Surgical History:  Procedure Laterality Date   COLONOSCOPY  2010   WNL Leone Payor)   EXCISION MORTON'S NEUROMA Left 2007   IR RADIOLOGIST EVAL & MGMT  07/02/2023   WISDOM TOOTH EXTRACTION  2010    Allergies: Amoxicillin, Doxycycline, Linzess [linaclotide], Neomycin, Nickel, and Other  Medications: Prior to Admission medications   Medication Sig Start Date End Date Taking? Authorizing Provider  cetirizine (ZYRTEC) 10 MG tablet Take 10 mg by mouth daily as needed for allergies.   Yes [provider]  Cholecalciferol (VITAMIN D3) 25 MCG (1000 UT) CAPS Take 1 capsule by mouth daily.   Yes [provider]  co-enzyme Q-10 30 MG capsule Take 100 mg by mouth daily. URQuinol.   Yes [provider]  cyanocobalamin (VITAMIN B12) 1000 MCG tablet Take 1,000 mcg by mouth 2 (two) times daily.   Yes [provider]  fexofenadine (ALLEGRA) 180 MG tablet Take by mouth.   Yes [provider]  fluticasone (FLONASE) 50 MCG/ACT nasal spray Place into both nostrils daily.   Yes [provider]  Ivermectin (SOOLANTRA) 1 % CREA Apply to face every  morning. 05/03/23  Yes Moye, [place], MD  Multiple Vitamin (MULTIVITAMIN ADULT) TABS Take 1 tablet by mouth daily. Ashby Dawes Made MVI for Her 12/28/21  Yes Eustaquio Boyden, MD  Oxymetazoline HCl 1 % CREA Apply to affected areas on face every morning. 05/03/23  Yes Moye, [place], MD  pimecrolimus (ELIDEL) 1 % cream Apply twice daily to affected body areas with KP 05/03/23  Yes Moye, [place], MD  Prucalopride Succinate (MOTEGRITY) 1 MG TABS Take 1 mg by mouth daily. 06/08/23  Yes Iva Boop, MD  Zinc 50 MG TABS Take by mouth. Takes 2 times weekly   Yes [provider]  diphenhydrAMINE (BENADRYL) 25 mg capsule Take 50 mg by mouth every 6 (six) hours as needed for allergies or itching.    [provider]  ibuprofen (ADVIL) 200 MG tablet Take by mouth.    [provider]  ketoconazole (NIZORAL) 2 % shampoo apply three times per week, massage into scalp and leave in for 10 minutes before rinsing out Patient taking differently: as needed. apply three times per week, massage into scalp and leave in for 10 minutes before rinsing out 07/18/22   Moye, [place], MD  LORazepam (ATIVAN) 0.5 MG tablet Take 0.5-1 tablets (0.25-0.5 mg total) by mouth at bedtime as needed for anxiety or sleep. 06/23/22   Eustaquio Boyden, MD  metoprolol tartrate (LOPRESSOR) 25 MG tablet Take 0.5 tablet (12.5 mg) by mouth twice daily as needed for tachycardia 01/22/23   Gollan, Tollie Pizza, MD  mometasone (ELOCON) 0.1 % cream Apply once to twice daily to itchy rash on chest, shoulders, back until improved. Avoid face, groin, underarms. Patient taking differently: as needed. Apply once to twice daily to itchy rash on chest, shoulders, back until improved. Avoid face, groin, underarms. 04/26/22   Willeen Niece, MD  polyethylene glycol [facility] / Ethelene Hal) packet Take 17 g by mouth daily. As needed    [provider]  Ruxolitinib Phosphate (OPZELURA) 1.5 % CREA Apply to affected skin once a day 06/19/22    Willeen Niece, MD  triamcinolone (NASACORT) 55 MCG/ACT AERO nasal inhaler Place 2 sprays into the nose daily. 06/26/18   Eustaquio Boyden, MD     Family History  Problem Relation Age of Onset   Hypothyroidism Mother    Hypertension Mother    Liver disease Father    Colon cancer Father    Hypertension Father    Cancer Father        pancreas   Cirrhosis Father    COPD Father    Atrial fibrillation Father    Diabetes Father    Hypertension Maternal Grandmother    Heart failure Maternal Grandmother    Dementia Maternal Grandmother    Stroke Maternal Grandmother    Hypertension Paternal Grandmother    Cancer Paternal Grandmother        rare colon cancer   Colon cancer Paternal Aunt     Social History   Socioeconomic History   Marital status: Single    Spouse name: Not on file   Number of children: 0   Years of education: 20   Highest  education level: Bachelor's degree (e.g., BA, AB, BS)  Occupational History   Occupation: IT   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   Occupation: Primary school teacher: [employer]  Tobacco Use   Smoking status: Never   Smokeless tobacco: Never  Vaping Use   Vaping status: Never Used  Substance and Sexual Activity   Alcohol use: Yes    Comment: on holidays   Drug use: No   Sexual activity: Yes    Birth control/protection: None  Other Topics Concern   Not on file  Social History Narrative   Caffeine use: 1 cup coffee/day and some tea   Lives at home with mother, father, and sister and pets (horses, ducks, Programme researcher, broadcasting/film/video, fish, wild [place])   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, 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   Edu: Environmental manager and Market researcher   Activity: Walking   Diet: good water, fruits/vegetables daily      Social Determinants of Corporate investment banker Strain: Not on file  Food Insecurity: No Food Insecurity (11/27/2022)   Hunger Vital Sign    Worried About Running Out of Food in the Last Year: Never true    Ran  Out of Food in the Last Year: Never true  Transportation Needs: No Transportation Needs (11/27/2022)   PRAPARE - Administrator, Civil Service (Medical): No    Lack of Transportation (Non-Medical): No  Physical Activity: Not on file  Stress: Not on file  Social Connections: Unknown (04/26/2022)   [facility]   Social Network    Social Network: Not on file    Review of Systems: A 12 point ROS discussed and pertinent positives are indicated in the HPI above.  All other systems are negative.  Review of Systems  Constitutional:  Positive for activity change and fatigue. Negative for appetite change and fever.  HENT:  Positive for tinnitus. Negative for trouble swallowing.   Respiratory:  Negative for cough and shortness of breath.   Gastrointestinal:  Negative for abdominal pain.  Musculoskeletal:  Negative for back pain and gait problem.  Neurological:  Negative for weakness.  Psychiatric/Behavioral:  Negative for behavioral problems and confusion.     Vital Signs: BP 122/77   Pulse 91   Temp 97.8 F (36.6 C) (Temporal)   Resp 16   Ht 5\' 7"  (1.702 m)   Wt 153 lb (69.4 kg)   SpO2 96%   BMI 23.96 kg/m     Physical Exam Vitals reviewed.  HENT:     Mouth/Throat:     Mouth: Mucous membranes are moist.  Cardiovascular:     Rate and Rhythm: Normal rate and regular rhythm.     Heart sounds: Normal heart sounds.  Pulmonary:     Effort: Pulmonary effort is normal.     Breath sounds: Normal breath sounds. No wheezing.  Abdominal:     Palpations: Abdomen is soft.     Tenderness: There is no abdominal tenderness.  Musculoskeletal:        General: Normal range of motion.     Right lower leg: No edema.     Left lower leg: No edema.  Skin:    General: Skin is warm.  Neurological:     Mental Status: She is alert and oriented to person, place, and time.  Psychiatric:        Mood and Affect: Mood normal.        Behavior: Behavior normal.  Thought Content: Thought content normal.        Judgment: Judgment normal.     Imaging: No results found.  Labs:  CBC: Recent Labs    11/14/22 1102 11/23/22 1359 03/05/23 1035 08/03/23 0828  WBC 7.6 8.0 7.1 6.1  HGB 13.8 13.4 13.6 11.7*  HCT 41.9 40.4 40.7 35.7*  PLT 283 294 282 247    COAGS: Recent Labs    08/03/23 0828  INR 1.0    BMP: Recent Labs    11/14/22 1102 11/23/22 1359 03/05/23 1035 03/05/23 1037  NA 140 138 141 142  K 4.3 3.9 3.8 3.9  CL 101 104 103 104  CO2 24 27 24 25   GLUCOSE 86 119* 84 88  BUN 7 8 6 6   CALCIUM 9.8 9.4 9.6 9.7  CREATININE 0.61 0.79 0.57 0.64  GFRNONAA  --  >60  --   --     LIVER FUNCTION TESTS: Recent Labs    11/14/22 1102 11/23/22 1359 03/05/23 1035 03/05/23 1037  BILITOT 0.2 0.6 0.3 0.3  AST 18 20 26 28   ALT 7 7 13 15   ALKPHOS 62 48 85 85  PROT 6.8 7.4 7.1 7.0  ALBUMIN 4.4 3.8 4.7 4.6    TUMOR MARKERS: No results for input(s): "AFPTM", "CEA", "CA199", "CHROMGRNA" in the last 8760 hours.  Assessment and Plan:  Scheduled for cerebral arteriogram Risks and benefits of cerebral angiogram with intervention were discussed with the patient including, but not limited to bleeding, infection, vascular injury, contrast induced renal failure, stroke or even death.  This interventional procedure involves the use of X-rays and because of the nature of the planned procedure, it is possible that we will have prolonged use of X-ray fluoroscopy.  Potential radiation risks to you include (but are not limited to) the following: - A slightly elevated risk for cancer  several years later in life. This risk is typically less than 0.5% percent. This risk is low in comparison to the normal incidence of human cancer, which is 33% for women and 50% for men according to the American Cancer Society. - Radiation induced injury can include skin redness, resembling a rash, tissue breakdown / ulcers and hair loss (which can be temporary  or permanent).   The likelihood of either of these occurring depends on the difficulty of the procedure and whether you are sensitive to radiation due to previous procedures, disease, or genetic conditions.   IF your procedure requires a prolonged use of radiation, you will be notified and given written instructions for further action.  It is your responsibility to monitor the irradiated area for the 2 weeks following the procedure and to notify your physician if you are concerned that you have suffered a radiation induced injury.    All of the patient's questions were answered, patient is agreeable to proceed.  Consent signed and in chart.  Thank you for this interesting consult.  I greatly enjoyed meeting Caroline [name] and look forward to participating in their care.  A copy of this report was sent to the requesting provider on this date.  Electronically Signed: Robet Leu, PA-C 08/03/2023, 8:51 AM   I spent a total of  30 Minutes   in face to face in clinical consultation, greater than 50% of which was counseling/coordinating care for cerebral arteriogram

## 2023-08-03 NOTE — Procedures (Signed)
INR.  S/P 4 vessel cerebral arteriogram. RT rad approach. Findings  Neg for aneurysm . Full report to follow. S. MD

## 2023-08-07 ENCOUNTER — Other Ambulatory Visit (HOSPITAL_COMMUNITY): Payer: Self-pay | Admitting: Interventional Radiology

## 2023-08-07 DIAGNOSIS — H93A9 Pulsatile tinnitus, unspecified ear: Secondary | ICD-10-CM

## 2023-08-08 ENCOUNTER — Ambulatory Visit: Payer: BC Managed Care – PPO | Admitting: Family Medicine

## 2023-08-08 ENCOUNTER — Encounter: Payer: Self-pay | Admitting: Family Medicine

## 2023-08-08 VITALS — BP 120/82 | HR 102 | Temp 97.2°F | Ht 67.0 in | Wt 161.4 lb

## 2023-08-08 DIAGNOSIS — E559 Vitamin D deficiency, unspecified: Secondary | ICD-10-CM | POA: Diagnosis not present

## 2023-08-08 DIAGNOSIS — I479 Paroxysmal tachycardia, unspecified: Secondary | ICD-10-CM

## 2023-08-08 DIAGNOSIS — Q281 Other malformations of precerebral vessels: Secondary | ICD-10-CM

## 2023-08-08 DIAGNOSIS — K599 Functional intestinal disorder, unspecified: Secondary | ICD-10-CM

## 2023-08-08 DIAGNOSIS — R7879 Finding of abnormal level of heavy metals in blood: Secondary | ICD-10-CM

## 2023-08-08 DIAGNOSIS — K5909 Other constipation: Secondary | ICD-10-CM

## 2023-08-08 DIAGNOSIS — S40021A Contusion of right upper arm, initial encounter: Secondary | ICD-10-CM

## 2023-08-08 DIAGNOSIS — E781 Pure hyperglyceridemia: Secondary | ICD-10-CM

## 2023-08-08 DIAGNOSIS — U099 Post covid-19 condition, unspecified: Secondary | ICD-10-CM

## 2023-08-08 DIAGNOSIS — I951 Orthostatic hypotension: Secondary | ICD-10-CM | POA: Diagnosis not present

## 2023-08-08 DIAGNOSIS — R5382 Chronic fatigue, unspecified: Secondary | ICD-10-CM

## 2023-08-08 DIAGNOSIS — J45909 Unspecified asthma, uncomplicated: Secondary | ICD-10-CM

## 2023-08-08 DIAGNOSIS — R790 Abnormal level of blood mineral: Secondary | ICD-10-CM

## 2023-08-08 DIAGNOSIS — E538 Deficiency of other specified B group vitamins: Secondary | ICD-10-CM

## 2023-08-08 NOTE — Progress Notes (Unsigned)
Ph: [phone] Fax: [phone]   Patient ID: Caroline [name], female    DOB: 1991/12/19, 32 y.o.   MRN: [id number]  This visit was conducted in person.  BP 120/82   Pulse (!) 102   Temp (!) 97.2 F (36.2 C) (Temporal)   Ht 5\' 7"  (1.702 m)   Wt 161 lb 6 oz (73.2 kg)   LMP 07/30/2023   SpO2 98%   BMI 25.27 kg/m    CC: wound check  Subjective:   HPI: Caroline [name] is a 32 y.o. female presenting on 08/08/2023 for Wound Check (Wants to have R arm checked post procedure. Also, requests yearly labs. )   Established with [facility] long COVID clinic, seen 04/2023.   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 Started on Symbicort - has not tried this yet.   Started vestibular therapy through [facility].   Completed CT angiogram 05/2023 - 2mm outpouching of basilar artery at anterior-inferior cerebellar artery ?infundibulum vs vascular loop vs aneurysm. Proceeded with diagnostic catheter angiogram last week 07/2023 - mild eccentric fusiform dilation of basilar artery proximal to L anterior-inferior cerebellar aa without aneurysm. Suggest rpt 1 yr.   Having ongoing R wrist pain at site of angiography.   Normal 4 limb EMG/NCS 04/2023 Lucia Gaskins).   Mild anemia noted on recent labs.      Relevant past medical, surgical, family and social history reviewed and updated as indicated. Interim medical history since our last visit reviewed. Allergies and medications reviewed and updated. Outpatient Medications Prior to Visit  Medication Sig Dispense Refill   cetirizine (ZYRTEC) 10 MG tablet Take 10 mg by mouth daily as needed for allergies.     Cholecalciferol (VITAMIN D3) 25 MCG (1000 UT) CAPS Take 1 capsule by mouth daily.     co-enzyme Q-10 30 MG capsule Take 100 mg by mouth daily. URQuinol.     cyanocobalamin (VITAMIN B12) 1000 MCG tablet Take 1,000 mcg by mouth 2 (two) times daily.     diphenhydrAMINE (BENADRYL) 25 mg capsule Take 50 mg by mouth every 6 (six) hours as needed for  allergies or itching.     fexofenadine (ALLEGRA) 180 MG tablet Take by mouth.     fluticasone (FLONASE) 50 MCG/ACT nasal spray Place into both nostrils daily.     ibuprofen (ADVIL) 200 MG tablet Take by mouth.     ketoconazole (NIZORAL) 2 % shampoo apply three times per week, massage into scalp and leave in for 10 minutes before rinsing out (Patient taking differently: as needed. apply three times per week, massage into scalp and leave in for 10 minutes before rinsing out) 120 mL 2   LORazepam (ATIVAN) 0.5 MG tablet Take 0.5-1 tablets (0.25-0.5 mg total) by mouth at bedtime as needed for anxiety or sleep. 20 tablet 0   metoprolol tartrate (LOPRESSOR) 25 MG tablet Take 0.5 tablet (12.5 mg) by mouth twice daily as needed for tachycardia 60 tablet 3   mometasone (ELOCON) 0.1 % cream Apply once to twice daily to itchy rash on chest, shoulders, back until improved. Avoid face, groin, underarms. (Patient taking differently: as needed. Apply once to twice daily to itchy rash on chest, shoulders, back until improved. Avoid face, groin, underarms.) 45 g 1   Multiple Vitamin (MULTIVITAMIN ADULT) TABS Take 1 tablet by mouth daily. [employer] Made MVI for Her     Oxymetazoline HCl 1 % CREA Apply to affected areas on face every morning. 30 g 3   pimecrolimus (ELIDEL)  1 % cream Apply twice daily to affected body areas with KP 60 g 3   polyethylene glycol (MIRALAX / GLYCOLAX) packet Take 17 g by mouth daily. As needed     Prucalopride Succinate (MOTEGRITY) 1 MG TABS Take 1 mg by mouth daily. 30 tablet 0   Ruxolitinib Phosphate (OPZELURA) 1.5 % CREA Apply to affected skin once a day 60 g 2   triamcinolone (NASACORT) 55 MCG/ACT AERO nasal inhaler Place 2 sprays into the nose daily. 1 Inhaler    Zinc 50 MG TABS Take by mouth. Takes 2 times weekly     Ivermectin (SOOLANTRA) 1 % CREA Apply to face every morning. (Patient not taking: Reported on 08/08/2023) 30 g 3   No facility-administered medications prior to visit.      Per HPI unless specifically indicated in ROS section below Review of Systems  Objective:  BP 120/82   Pulse (!) 102   Temp (!) 97.2 F (36.2 C) (Temporal)   Ht 5\' 7"  (1.702 m)   Wt 161 lb 6 oz (73.2 kg)   LMP 07/30/2023   SpO2 98%   BMI 25.27 kg/m   Wt Readings from Last 3 Encounters:  08/08/23 161 lb 6 oz (73.2 kg)  08/03/23 153 lb (69.4 kg)  05/10/23 164 lb (74.4 kg)      Physical Exam    Results for orders placed or performed during the hospital encounter of 08/03/23  CBC  Result Value Ref Range   WBC 6.1 4.0 - 10.5 K/uL   RBC 4.26 3.87 - 5.11 MIL/uL   Hemoglobin 11.7 (L) 12.0 - 15.0 g/dL   HCT 40.9 (L) 81.1 - 91.4 %   MCV 83.8 80.0 - 100.0 fL   MCH 27.5 26.0 - 34.0 pg   MCHC 32.8 30.0 - 36.0 g/dL   RDW 78.2 95.6 - 21.3 %   Platelets 247 150 - 400 K/uL   nRBC 0.0 0.0 - 0.2 %  Protime-INR  Result Value Ref Range   Prothrombin Time 13.0 11.4 - 15.2 seconds   INR 1.0 0.8 - 1.2  Basic metabolic panel  Result Value Ref Range   Sodium 137 135 - 145 mmol/L   Potassium 3.5 3.5 - 5.1 mmol/L   Chloride 104 98 - 111 mmol/L   CO2 23 22 - 32 mmol/L   Glucose, Bld 91 70 - 99 mg/dL   BUN 10 6 - 20 mg/dL   Creatinine, Ser 0.86 0.44 - 1.00 mg/dL   Calcium 9.0 8.9 - 57.8 mg/dL   GFR, Estimated >46 >96 mL/min   Anion gap 10 5 - 15  Pregnancy, urine  Result Value Ref Range   Preg Test, Ur NEGATIVE NEGATIVE   Lab Results  Component Value Date   CHOL 189 06/29/2022   HDL 51 06/29/2022   LDLCALC 84 06/29/2022   TRIG 331 (H) 06/29/2022   CHOLHDL 3.7 06/29/2022    Lab Results  Component Value Date   NA 137 08/03/2023   CL 104 08/03/2023   K 3.5 08/03/2023   CO2 23 08/03/2023   BUN 10 08/03/2023   CREATININE 0.67 08/03/2023   GFRNONAA >60 08/03/2023   CALCIUM 9.0 08/03/2023   ALBUMIN 4.6 03/05/2023   GLUCOSE 91 08/03/2023    Lab Results  Component Value Date   WBC 6.1 08/03/2023   HGB 11.7 (L) 08/03/2023   HCT 35.7 (L) 08/03/2023   MCV 83.8 08/03/2023    PLT 247 08/03/2023    Lab Results  Component  Value Date   ALT 15 03/05/2023   AST 28 03/05/2023   ALKPHOS 85 03/05/2023   BILITOT 0.3 03/05/2023    Assessment & Plan:   Problem List Items Addressed This Visit     Post covid-19 condition, unspecified - Primary   Relevant Orders   Vitamin B12   VITAMIN D 25 Hydroxy (Vit-D Deficiency, Fractures)   TSH   Zinc   Copper, Serum   CBC with Differential/Platelet   Basic metabolic panel   Lipid panel   Iron, TIBC and Ferritin Panel     No orders of the defined types were placed in this encounter.   Orders Placed This Encounter  Procedures   Vitamin B12    Standing Status:   Future    Number of Occurrences:   1    Standing Expiration Date:   08/07/2024   VITAMIN D 25 Hydroxy (Vit-D Deficiency, Fractures)    Standing Status:   Future    Number of Occurrences:   1    Standing Expiration Date:   08/07/2024   TSH    Standing Status:   Future    Number of Occurrences:   1    Standing Expiration Date:   08/07/2024   Zinc    Standing Status:   Future    Number of Occurrences:   1    Standing Expiration Date:   08/07/2024   Copper, Serum    Standing Status:   Future    Number of Occurrences:   1    Standing Expiration Date:   08/07/2024   CBC with Differential/Platelet    Standing Status:   Future    Number of Occurrences:   1    Standing Expiration Date:   08/07/2024   Basic metabolic panel    Standing Status:   Future    Number of Occurrences:   1    Standing Expiration Date:   08/07/2024   Lipid panel    Standing Status:   Future    Number of Occurrences:   1    Standing Expiration Date:   08/07/2024   Iron, TIBC and Ferritin Panel    Standing Status:   Future    Number of Occurrences:   1    Standing Expiration Date:   08/07/2024    Patient Instructions  Get fasting labs at [facility] at your convenience. Start with acetaminophen 1000mg  three times daily for pain. If breakthrough pain despite this, may take ibuprofen  200-400mg  at a time.  Keep arm elevated  Let us know if not getting better.   Follow up plan: Return if symptoms worsen or fail to improve.  Eustaquio Boyden, MD

## 2023-08-08 NOTE — Patient Instructions (Signed)
Get fasting labs at [facility] at your convenience. Start with acetaminophen 1000mg  three times daily for pain. If breakthrough pain despite this, may take ibuprofen 200-400mg  at a time.  Keep arm elevated  Let us know if not getting better.

## 2023-08-09 ENCOUNTER — Ambulatory Visit: Payer: BC Managed Care – PPO

## 2023-08-09 DIAGNOSIS — S40021A Contusion of right upper arm, initial encounter: Secondary | ICD-10-CM | POA: Insufficient documentation

## 2023-08-09 DIAGNOSIS — M25531 Pain in right wrist: Secondary | ICD-10-CM | POA: Insufficient documentation

## 2023-08-09 DIAGNOSIS — Q281 Other malformations of precerebral vessels: Secondary | ICD-10-CM | POA: Insufficient documentation

## 2023-08-09 DIAGNOSIS — M25431 Effusion, right wrist: Secondary | ICD-10-CM | POA: Insufficient documentation

## 2023-08-09 NOTE — Assessment & Plan Note (Signed)
Update levels on 1000 international units  daily.

## 2023-08-09 NOTE — Assessment & Plan Note (Signed)
Has seen cardiology and neurology, pending [facility] evaluation - wait list for 2025 appt.

## 2023-08-09 NOTE — Assessment & Plan Note (Signed)
Cerebral catheter angiogram 07/2023 through IR - mild eccentric fusiform dilation of basilar artery proximal to L anterior-inferior cerebellar artery without aneurysm - rec f/u 1 year.

## 2023-08-09 NOTE — Assessment & Plan Note (Signed)
Saw [facility] post-COVID clinic.

## 2023-08-09 NOTE — Assessment & Plan Note (Signed)
?  post-viral RAD.  Has not yet started symbicort.  Has hot needed albuterol rescue inhaler recently.

## 2023-08-09 NOTE — Assessment & Plan Note (Signed)
Pending [facility] dysautonomia clinic 2025

## 2023-08-09 NOTE — Assessment & Plan Note (Addendum)
We have been unable to find a cause for this. Update zinc and copper levels.  She notes she's started using Brita filter and also recently bought a new refrigerator.

## 2023-08-09 NOTE — Assessment & Plan Note (Signed)
Bruising/ecchymosis and tenderness persists at R radial artery site of recent cerebral angiography 5 days ago. Home care reviewed, supportive measures recommended including arm elevation, ice, tylenol, with sparing ibuprofen use. Area dressed with fluffy kerlex and coban. Update if not improving with time. No signs of complication, infection, hematoma.

## 2023-08-09 NOTE — Assessment & Plan Note (Signed)
Saw heme last year - oral iron stopped.  Update levels off oral replacement.

## 2023-08-09 NOTE — Assessment & Plan Note (Addendum)
Appreciate GI care continues Motegrity with benefit - pending eval by [facility]

## 2023-08-09 NOTE — Assessment & Plan Note (Signed)
Update FLP when fasting.

## 2023-08-09 NOTE — Assessment & Plan Note (Addendum)
Update levels on vit B12 twice daily.

## 2023-08-16 ENCOUNTER — Ambulatory Visit: Payer: BC Managed Care – PPO | Admitting: Family Medicine

## 2023-08-16 ENCOUNTER — Encounter: Payer: Self-pay | Admitting: Family Medicine

## 2023-08-16 VITALS — BP 124/72 | HR 83 | Temp 98.0°F | Ht 67.0 in | Wt 164.2 lb

## 2023-08-16 DIAGNOSIS — S40021D Contusion of right upper arm, subsequent encounter: Secondary | ICD-10-CM

## 2023-08-16 DIAGNOSIS — M7989 Other specified soft tissue disorders: Secondary | ICD-10-CM

## 2023-08-16 MED ORDER — PREDNISONE 10 MG PO TABS: ORAL_TABLET | ORAL | 0 refills | Status: DC

## 2023-08-16 NOTE — Progress Notes (Signed)
Patient ID: Caroline [name], female    DOB: Jun 17, 1991, 32 y.o.   MRN: [id number]  This visit was conducted in person.  BP 124/72 (BP Location: Right Arm, Patient Position: Sitting, Cuff Size: Normal)   Pulse 83   Temp 98 F (36.7 C) (Temporal)   Ht 5\' 7"  (1.702 m)   Wt 164 lb 4 oz (74.5 kg)   LMP 07/30/2023   SpO2 97%   BMI 25.73 kg/m    CC:  Chief Complaint  Patient presents with   1 week arm check f/u    Right arm    Subjective:   HPI: Caroline [name] is a 32 y.o. female patient of Dr. Sharen Hones of presenting on 08/16/2023 for 1 week arm check f/u (Right arm)  Reviewed last office visit from August 16, 2023 from Dr. Arizona Constable  Reviewed procedure notes: August 03, 2023 interventional radiology vertebral artery angiogram performed for history of tinnitus, vertigo and suspicious of abnormality in left anterior/inferior sella barrel artery seen on CT angiogram of head and neck. Right radial puncture site. 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 This probably represents a developmental variation. Suggest follow-up CT angiogram of the head and neck in about a year to evaluate for stability.   PLAN: Suggest follow-up CT angiogram of the head and neck in 1 year to evaluate for any changes in the basilar artery at the level of the anterior-inferior cerebellar arteries.  She had 1 week follow up with PCP.. had  swelling and bruising.  Today she presents for evaluation of her right arm following this procedure.  Sh feels swelling has improved. Pain does worse after use typing at work. She states she has noted continued bruising, pain with moving wrist and turning doornobs.   Not able to grasp things given pain.  Feeling  some pain in right elbow as well medially.  Icing forearm. Elevating arm some.    Using ibuprofen 400 mg every 6 hours   Having some warmth  and tingling in fingers off and on positionally.  Relevant past medical, surgical, family and social history reviewed and updated as indicated. Interim medical history since our last visit reviewed. Allergies and medications reviewed and updated. Outpatient Medications Prior to Visit  Medication Sig Dispense Refill   cetirizine (ZYRTEC) 10 MG tablet Take 10 mg by mouth daily as needed for allergies.     Cholecalciferol (VITAMIN D3) 25 MCG (1000 UT) CAPS Take 1 capsule by mouth daily.     co-enzyme Q-10 30 MG capsule Take 100 mg by mouth daily. URQuinol.     cyanocobalamin (VITAMIN B12) 1000 MCG tablet Take 1,000 mcg by mouth 2 (two) times daily.     diphenhydrAMINE (BENADRYL) 25 mg capsule Take 50 mg by mouth every 6 (six) hours as needed for allergies or itching.     fexofenadine (ALLEGRA) 180 MG tablet Take by mouth.     fluticasone (FLONASE) 50 MCG/ACT nasal spray Place into both nostrils daily.     ibuprofen (ADVIL) 200 MG tablet Take by mouth.     ketoconazole (NIZORAL) 2 % shampoo apply three times per week, massage into scalp and leave in for 10 minutes before rinsing out (Patient taking differently: as needed. apply three times per week, massage into scalp and leave in for 10 minutes before rinsing out) 120 mL 2   metoprolol tartrate (LOPRESSOR) 25 MG tablet Take 0.5  tablet (12.5 mg) by mouth twice daily as needed for tachycardia 60 tablet 3   mometasone (ELOCON) 0.1 % cream Apply once to twice daily to itchy rash on chest, shoulders, back until improved. Avoid face, groin, underarms. (Patient taking differently: as needed. Apply once to twice daily to itchy rash on chest, shoulders, back until improved. Avoid face, groin, underarms.) 45 g 1   Multiple Vitamin (MULTIVITAMIN ADULT) TABS Take 1 tablet by mouth daily. Nature Made MVI for Her     Oxymetazoline HCl 1 % CREA Apply to affected areas on face every morning. 30 g 3   pimecrolimus (ELIDEL) 1 % cream Apply twice daily to affected body  areas with KP 60 g 3   polyethylene glycol (MIRALAX / GLYCOLAX) packet Take 17 g by mouth daily. As needed     Prucalopride Succinate (MOTEGRITY) 1 MG TABS Take 1 mg by mouth daily. 30 tablet 0   Ruxolitinib Phosphate (OPZELURA) 1.5 % CREA Apply to affected skin once a day 60 g 2   triamcinolone (NASACORT) 55 MCG/ACT AERO nasal inhaler Place 2 sprays into the nose daily. 1 Inhaler    Zinc 50 MG TABS Take by mouth. Takes 2 times weekly     LORazepam (ATIVAN) 0.5 MG tablet Take 0.5-1 tablets (0.25-0.5 mg total) by mouth at bedtime as needed for anxiety or sleep. (Patient not taking: Reported on 08/16/2023) 20 tablet 0   Ivermectin (SOOLANTRA) 1 % CREA Apply to face every morning. (Patient not taking: Reported on 08/08/2023) 30 g 3   No facility-administered medications prior to visit.     Per HPI unless specifically indicated in ROS section below Review of Systems  Constitutional:  Negative for fatigue and fever.  HENT:  Negative for congestion.   Eyes:  Negative for pain.  Respiratory:  Negative for cough and shortness of breath.   Cardiovascular:  Negative for chest pain, palpitations and leg swelling.  Gastrointestinal:  Negative for abdominal pain.  Genitourinary:  Negative for dysuria and vaginal bleeding.  Musculoskeletal:  Negative for back pain.  Neurological:  Negative for syncope, light-headedness and headaches.  Psychiatric/Behavioral:  Negative for dysphoric mood.    Objective:  BP 124/72 (BP Location: Right Arm, Patient Position: Sitting, Cuff Size: Normal)   Pulse 83   Temp 98 F (36.7 C) (Temporal)   Ht 5\' 7"  (1.702 m)   Wt 164 lb 4 oz (74.5 kg)   LMP 07/30/2023   SpO2 97%   BMI 25.73 kg/m   Wt Readings from Last 3 Encounters:  08/16/23 164 lb 4 oz (74.5 kg)  08/08/23 161 lb 6 oz (73.2 kg)  08/03/23 153 lb (69.4 kg)      Physical Exam Constitutional:      General: She is not in acute distress.    Appearance: Normal appearance. She is well-developed. She is not  ill-appearing or toxic-appearing.  HENT:     Head: Normocephalic.     Right Ear: Hearing, tympanic membrane, ear canal and external ear normal. Tympanic membrane is not erythematous, retracted or bulging.     Left Ear: Hearing, tympanic membrane, ear canal and external ear normal. Tympanic membrane is not erythematous, retracted or bulging.     Nose: No mucosal edema or rhinorrhea.     Right Sinus: No maxillary sinus tenderness or frontal sinus tenderness.     Left Sinus: No maxillary sinus tenderness or frontal sinus tenderness.     Mouth/Throat:     Mouth: Oropharynx is clear and moist  and mucous membranes are normal.     Pharynx: Uvula midline.  Eyes:     General: Lids are normal. Lids are everted, no foreign bodies appreciated.     Extraocular Movements: EOM normal.     Conjunctiva/sclera: Conjunctivae normal.     Pupils: Pupils are equal, round, and reactive to light.  Neck:     Thyroid: No thyroid mass or thyromegaly.     Vascular: No carotid bruit.     Trachea: Trachea normal.  Cardiovascular:     Rate and Rhythm: Normal rate and regular rhythm.     Pulses: Normal pulses.     Heart sounds: Normal heart sounds, S1 normal and S2 normal. No murmur heard.    No friction rub. No gallop.  Pulmonary:     Effort: Pulmonary effort is normal. No tachypnea or respiratory distress.     Breath sounds: Normal breath sounds. No decreased breath sounds, wheezing, rhonchi or rales.  Abdominal:     General: Bowel sounds are normal.     Palpations: Abdomen is soft.     Tenderness: There is no abdominal tenderness.  Musculoskeletal:     Right shoulder: Normal. No swelling, tenderness or bony tenderness. Normal range of motion.     Right upper arm: Normal. No swelling or deformity.     Right elbow: Decreased range of motion. No tenderness.     Right forearm: Swelling, tenderness and bony tenderness present.     Right wrist: Swelling, tenderness and bony tenderness present. Decreased range of  motion.     Right hand: Swelling present. Decreased range of motion. Decreased strength.     Cervical back: Normal, normal range of motion and neck supple. No spasms, tenderness or bony tenderness. Normal range of motion.     Comments: No focal tenderness at elbow over medial or lateral epicondyle tendon insertion  Skin:    General: Skin is warm, dry and intact.     Findings: No rash.  Neurological:     Mental Status: She is alert.  Psychiatric:        Mood and Affect: Mood is not anxious or depressed.        Speech: Speech normal.        Behavior: Behavior normal. Behavior is cooperative.        Thought Content: Thought content normal.        Cognition and Memory: Cognition and memory normal.        Judgment: Judgment normal.            Results for orders placed or performed during the hospital encounter of 08/03/23  CBC  Result Value Ref Range   WBC 6.1 4.0 - 10.5 K/uL   RBC 4.26 3.87 - 5.11 MIL/uL   Hemoglobin 11.7 (L) 12.0 - 15.0 g/dL   HCT 09.6 (L) 04.5 - 40.9 %   MCV 83.8 80.0 - 100.0 fL   MCH 27.5 26.0 - 34.0 pg   MCHC 32.8 30.0 - 36.0 g/dL   RDW 81.1 91.4 - 78.2 %   Platelets 247 150 - 400 K/uL   nRBC 0.0 0.0 - 0.2 %  Protime-INR  Result Value Ref Range   Prothrombin Time 13.0 11.4 - 15.2 seconds   INR 1.0 0.8 - 1.2  Basic metabolic panel  Result Value Ref Range   Sodium 137 135 - 145 mmol/L   Potassium 3.5 3.5 - 5.1 mmol/L   Chloride 104 98 - 111 mmol/L   CO2 23 22 -  32 mmol/L   Glucose, Bld 91 70 - 99 mg/dL   BUN 10 6 - 20 mg/dL   Creatinine, Ser 1.61 0.44 - 1.00 mg/dL   Calcium 9.0 8.9 - 09.6 mg/dL   GFR, Estimated >04 >54 mL/min   Anion gap 10 5 - 15  Pregnancy, urine  Result Value Ref Range   Preg Test, Ur NEGATIVE NEGATIVE    Assessment and Plan Postprocedural swelling, pain and bruising in right arm following angiogram with radial insertion. Arm bruise, right, subsequent encounter  Hand swelling  Other orders -     predniSONE; 3 tabs by  mouth daily x 2 days, then 2 tabs by mouth daily x 2 days then 1 tab by mouth daily x 2 days then 1/2 tab for 2 days  Dispense: 13 tablet; Refill: 0  She continues to have significant pain although she reports that it seems to be gradually improving.  Swelling is not constant and seems to be more with use. There is no fever and no redness suggesting any infection. No red flags for imaging or admission.  No clear evidence of compression syndrome. Recommend elevating above her heart, continuing ice.  We will try a low-dose slow taper of prednisone (she did have some possible side effects or reaction to this in the past not while she was on it but when she came off of it but these issues may have been instead secondary to COVID/post-COVID syndrome) Encouraged gradual range of motion exercises I have encouraged her to contact interventional radiology for additional recommendations.  Return and ER precautions provided.  No follow-ups on file.   Kerby Nora, MD

## 2023-08-16 NOTE — Patient Instructions (Signed)
Let IR know of status of arm for any recommendations.  Start prednisone low dose slow taper.   Elevate arm above head.  Ice arm  as needed.

## 2023-08-22 DIAGNOSIS — J302 Other seasonal allergic rhinitis: Secondary | ICD-10-CM | POA: Diagnosis not present

## 2023-08-22 DIAGNOSIS — R232 Flushing: Secondary | ICD-10-CM | POA: Diagnosis not present

## 2023-08-22 DIAGNOSIS — J4530 Mild persistent asthma, uncomplicated: Secondary | ICD-10-CM | POA: Diagnosis not present

## 2023-08-22 DIAGNOSIS — R42 Dizziness and giddiness: Secondary | ICD-10-CM | POA: Diagnosis not present

## 2023-08-28 ENCOUNTER — Telehealth (HOSPITAL_COMMUNITY): Payer: Self-pay

## 2023-08-28 ENCOUNTER — Telehealth: Payer: Self-pay | Admitting: Student

## 2023-08-28 NOTE — Telephone Encounter (Signed)
Patient contact NIR service regarding wrist pain and swelling following angiogram with Dr Corliss Skains on 08/03/23. Patient has been seen twice by her primary care office and was most recently prescribed prednisone on 08/16/23. The patient states that this did improve the swelling she had previously been experiencing. The patient reports intact sensation, denies any coolness to the right hand, and still has motor function, but states she feels like she has less tolerance for activity due to pain. She denies fever and chills. No other concern for compartment syndrome at this time. Patient advised to present to ED in case of loss of motor function, loss of sensation, or if her right hands becomes cool to the touch. Patient voiced her understanding. Patient to be scheduled to be seen in person by Dr Corliss Skains on 09/06/23. Kennieth Francois, PA-C 08/28/2023

## 2023-08-28 NOTE — Telephone Encounter (Signed)
Pt called with complaints of wrist swelling since her angiogram. She has seen her PCP twice with no relief. I've sent a message to our PA Molli Hazard to give her a call. AB Dr. Corliss Skains is out this week

## 2023-08-28 NOTE — Telephone Encounter (Signed)
Called to schedule consult, no answer, left vm. AB  

## 2023-08-29 DIAGNOSIS — M6289 Other specified disorders of muscle: Secondary | ICD-10-CM | POA: Diagnosis not present

## 2023-08-29 DIAGNOSIS — N3281 Overactive bladder: Secondary | ICD-10-CM | POA: Diagnosis not present

## 2023-08-29 DIAGNOSIS — N390 Urinary tract infection, site not specified: Secondary | ICD-10-CM | POA: Diagnosis not present

## 2023-08-29 DIAGNOSIS — R8290 Unspecified abnormal findings in urine: Secondary | ICD-10-CM | POA: Diagnosis not present

## 2023-09-06 ENCOUNTER — Ambulatory Visit (HOSPITAL_COMMUNITY)
Admission: RE | Admit: 2023-09-06 | Discharge: 2023-09-06 | Disposition: A | Discharge status: Home / Self Care | Payer: BC Managed Care – PPO | Source: Ambulatory Visit | Attending: Interventional Radiology | Admitting: Interventional Radiology

## 2023-09-06 DIAGNOSIS — H93A9 Pulsatile tinnitus, unspecified ear: Secondary | ICD-10-CM

## 2023-09-06 DIAGNOSIS — I671 Cerebral aneurysm, nonruptured: Secondary | ICD-10-CM | POA: Diagnosis not present

## 2023-09-10 HISTORY — PX: IR RADIOLOGIST EVAL & MGMT: IMG5224

## 2023-09-14 DIAGNOSIS — E781 Pure hyperglyceridemia: Secondary | ICD-10-CM | POA: Diagnosis not present

## 2023-09-14 DIAGNOSIS — E559 Vitamin D deficiency, unspecified: Secondary | ICD-10-CM | POA: Diagnosis not present

## 2023-09-14 DIAGNOSIS — R5382 Chronic fatigue, unspecified: Secondary | ICD-10-CM | POA: Diagnosis not present

## 2023-09-14 DIAGNOSIS — R7879 Finding of abnormal level of heavy metals in blood: Secondary | ICD-10-CM | POA: Diagnosis not present

## 2023-09-14 DIAGNOSIS — U099 Post covid-19 condition, unspecified: Secondary | ICD-10-CM | POA: Diagnosis not present

## 2023-09-14 DIAGNOSIS — E538 Deficiency of other specified B group vitamins: Secondary | ICD-10-CM | POA: Diagnosis not present

## 2023-09-14 DIAGNOSIS — R790 Abnormal level of blood mineral: Secondary | ICD-10-CM | POA: Diagnosis not present

## 2023-09-14 DIAGNOSIS — I951 Orthostatic hypotension: Secondary | ICD-10-CM | POA: Diagnosis not present

## 2023-09-14 DIAGNOSIS — R202 Paresthesia of skin: Secondary | ICD-10-CM | POA: Diagnosis not present

## 2023-09-18 DIAGNOSIS — K5904 Chronic idiopathic constipation: Secondary | ICD-10-CM | POA: Diagnosis not present

## 2023-09-18 DIAGNOSIS — B3731 Acute candidiasis of vulva and vagina: Secondary | ICD-10-CM | POA: Diagnosis not present

## 2023-09-18 DIAGNOSIS — R32 Unspecified urinary incontinence: Secondary | ICD-10-CM | POA: Diagnosis not present

## 2023-09-19 LAB — IRON,TIBC AND FERRITIN PANEL
Ferritin: 16 ng/mL (ref 15–150)
Iron Saturation: 18 % (ref 15–55)
Iron: 69 ug/dL (ref 27–159)
Total Iron Binding Capacity: 378 ug/dL (ref 250–450)
UIBC: 309 ug/dL (ref 131–425)

## 2023-09-19 LAB — VITAMIN D 25 HYDROXY (VIT D DEFICIENCY, FRACTURES): Vit D, 25-Hydroxy: 30.2 ng/mL (ref 30.0–100.0)

## 2023-09-19 LAB — LIPID PANEL
Chol/HDL Ratio: 2.4 ratio (ref 0.0–4.4)
Cholesterol, Total: 161 mg/dL (ref 100–199)
HDL: 68 mg/dL (ref >39)
LDL Chol Calc (NIH): 76 mg/dL (ref 0–99)
Triglycerides: 92 mg/dL (ref 0–149)
VLDL Cholesterol Cal: 17 mg/dL (ref 5–40)

## 2023-09-19 LAB — CBC WITH DIFFERENTIAL/PLATELET
Basophils Absolute: 0 10*3/uL (ref 0.0–0.2)
Basos: 0 %
EOS (ABSOLUTE): 0.1 10*3/uL (ref 0.0–0.4)
Eos: 1 %
Hematocrit: 40.1 % (ref 34.0–46.6)
Hemoglobin: 12.5 g/dL (ref 11.1–15.9)
Immature Grans (Abs): 0 10*3/uL (ref 0.0–0.1)
Immature Granulocytes: 0 %
Lymphocytes Absolute: 2.5 10*3/uL (ref 0.7–3.1)
Lymphs: 34 %
MCH: 27.7 pg (ref 26.6–33.0)
MCHC: 31.2 g/dL — ABNORMAL LOW (ref 31.5–35.7)
MCV: 89 fL (ref 79–97)
Monocytes Absolute: 0.6 10*3/uL (ref 0.1–0.9)
Monocytes: 8 %
Neutrophils Absolute: 4.3 10*3/uL (ref 1.4–7.0)
Neutrophils: 57 %
Platelets: 266 10*3/uL (ref 150–450)
RBC: 4.51 x10E6/uL (ref 3.77–5.28)
RDW: 13 % (ref 11.7–15.4)
WBC: 7.5 10*3/uL (ref 3.4–10.8)

## 2023-09-19 LAB — BASIC METABOLIC PANEL
BUN/Creatinine Ratio: 12 (ref 9–23)
BUN: 8 mg/dL (ref 6–20)
CO2: 22 mmol/L (ref 20–29)
Calcium: 9.5 mg/dL (ref 8.7–10.2)
Chloride: 103 mmol/L (ref 96–106)
Creatinine, Ser: 0.67 mg/dL (ref 0.57–1.00)
Glucose: 77 mg/dL (ref 70–99)
Potassium: 4.2 mmol/L (ref 3.5–5.2)
Sodium: 140 mmol/L (ref 134–144)
eGFR: 120 mL/min/{1.73_m2} (ref >59)

## 2023-09-19 LAB — ZINC: Zinc: 87 ug/dL (ref 44–115)

## 2023-09-19 LAB — VITAMIN B12: Vitamin B-12: 694 pg/mL (ref 232–1245)

## 2023-09-19 LAB — TSH: TSH: 2.37 u[IU]/mL (ref 0.450–4.500)

## 2023-09-19 LAB — COPPER, SERUM: Copper: 137 ug/dL (ref 80–158)

## 2023-09-20 ENCOUNTER — Other Ambulatory Visit: Payer: Self-pay | Admitting: Internal Medicine

## 2023-09-25 ENCOUNTER — Telehealth: Payer: Self-pay | Admitting: Family Medicine

## 2023-09-25 NOTE — Telephone Encounter (Signed)
Pt asked for a call back to discuss what's in the flu shot? Call back # [phone]

## 2023-09-27 ENCOUNTER — Other Ambulatory Visit (HOSPITAL_COMMUNITY): Payer: Self-pay | Admitting: Interventional Radiology

## 2023-09-27 DIAGNOSIS — H93A9 Pulsatile tinnitus, unspecified ear: Secondary | ICD-10-CM

## 2023-10-02 ENCOUNTER — Ambulatory Visit (HOSPITAL_COMMUNITY)
Admission: RE | Admit: 2023-10-02 | Discharge: 2023-10-02 | Disposition: A | Discharge status: Home / Self Care | Payer: BC Managed Care – PPO | Source: Ambulatory Visit | Attending: Interventional Radiology | Admitting: Interventional Radiology

## 2023-10-02 DIAGNOSIS — H93A9 Pulsatile tinnitus, unspecified ear: Secondary | ICD-10-CM

## 2023-10-02 DIAGNOSIS — I671 Cerebral aneurysm, nonruptured: Secondary | ICD-10-CM | POA: Diagnosis not present

## 2023-10-04 HISTORY — PX: IR RADIOLOGIST EVAL & MGMT: IMG5224

## 2023-10-08 ENCOUNTER — Ambulatory Visit: Payer: BC Managed Care – PPO | Admitting: Family Medicine

## 2023-10-08 ENCOUNTER — Encounter: Payer: Self-pay | Admitting: Family Medicine

## 2023-10-08 VITALS — BP 116/70 | HR 90 | Temp 98.3°F | Ht 67.0 in | Wt 163.5 lb

## 2023-10-08 DIAGNOSIS — R5382 Chronic fatigue, unspecified: Secondary | ICD-10-CM

## 2023-10-08 DIAGNOSIS — M25531 Pain in right wrist: Secondary | ICD-10-CM | POA: Diagnosis not present

## 2023-10-08 DIAGNOSIS — M25431 Effusion, right wrist: Secondary | ICD-10-CM

## 2023-10-08 DIAGNOSIS — N3281 Overactive bladder: Secondary | ICD-10-CM

## 2023-10-08 DIAGNOSIS — D802 Selective deficiency of immunoglobulin A [IgA]: Secondary | ICD-10-CM

## 2023-10-08 DIAGNOSIS — R790 Abnormal level of blood mineral: Secondary | ICD-10-CM | POA: Diagnosis not present

## 2023-10-08 DIAGNOSIS — G4700 Insomnia, unspecified: Secondary | ICD-10-CM

## 2023-10-08 MED ORDER — LORAZEPAM 0.5 MG PO TABS
0.2500 mg | ORAL_TABLET | Freq: Every evening | ORAL | 0 refills | Status: DC | PRN
Start: 2023-10-08 — End: 2024-10-06

## 2023-10-08 MED ORDER — FERROUS SULFATE DRIED ER 143 (45 FE) MG PO TBCR: 1.0000 | EXTENDED_RELEASE_TABLET | ORAL | Status: DC

## 2023-10-08 NOTE — Assessment & Plan Note (Signed)
Requests lorazepam refill.

## 2023-10-08 NOTE — Patient Instructions (Addendum)
We will refer you to [facility] physical therapy.  Start low iron over the counter supplement every other day during weeks of period Try plant-based sources of protein, consider protein supplements.  Good to see you today Return in 2-3 months for physical.

## 2023-10-08 NOTE — Assessment & Plan Note (Addendum)
Significant improvement after large bruise with swelling and limitations after vertebral artery angiogram 08/03/2023. Prednisone taper did help with swelling.  Notes persistent limitations when it comes to bearing weight, lifting >5 lbs, or forced wrist extension against resistance.  Will refer for PT course per interventional radiology recommendations.

## 2023-10-08 NOTE — Assessment & Plan Note (Addendum)
Ferritin low - endorses heavier periods since stopping OCP.  Will restart low dose oral iron (low-FE) every other day during period week.  She has previously tolerated low dose well without constipation.

## 2023-10-08 NOTE — Assessment & Plan Note (Signed)
Established with [facility] pending PFPT, to consider PTNS.

## 2023-10-08 NOTE — Assessment & Plan Note (Addendum)
With flushing - saw immunology, tested negative for mast cell activation disorder, rec f/u PRN or if recurrent symptoms.

## 2023-10-08 NOTE — Progress Notes (Signed)
Ph: [phone] Fax: [phone]   Patient ID: Caroline [name], female    DOB: June 04, 1991, 32 y.o.   MRN: [id number]  This visit was conducted in person.  BP 116/70   Pulse 90   Temp 98.3 F (36.8 C) (Temporal)   Ht 5\' 7"  (1.702 m)   Wt 163 lb 8 oz (74.2 kg)   LMP 09/24/2023   SpO2 99%   BMI 25.61 kg/m    CC: R wrist pain  Subjective:   HPI: Caroline [name] is a 32 y.o. female presenting on 10/08/2023 for Wrist Pain (C/o ongoing R wrist/hand pain. Wants to discuss PT. Also, wants to discuss flu shot and iron levels due to fatigue and heavy menses. )   See prior notes.  R radial forearm bruising after vertebral artery angiogram 08/03/2023 with persistent swelling and bruising for weeks. Saw myself and another provider in office x2, treated with short prednisone taper after failing other supportive measures (ibuprofen, ice, elevation) with limited improvement. Saw Dr Corliss Skains in follow up x2 - most recently 10/02/2023 with continued improvement but persistence of restriction of wrist strength testing in palmar and dorsal extension - who recommended PT referral. Notes ongoing pain with wrist extension or with putting weight on it, trouble picking up 9 lb cat with her right hand.  R-handed.   OAB - saw Kateri Mc urogyn NP Antony Contras planned PFPT (pending), to consider PTNS.   [place] - saw [facility]/allergy Dr Ellie Lunch in follow up 07/2023 for evaluation of IgA deficiency and mast cell activation - s/p negative workup. Was referred to [facility] dysautonom[facility] clinic as [facility] referral is 13 months out.   Pending starting vestibular rehab through [facility].   Recent ferritin levels low (16). Previously tolerated Lo-FE.  Heavy periods since discontinuing period.   Notes trouble with red meat - causes nausea. Ongoing for the past year. Doesn't like pork or fish. No hives or angioedema with this. Does eat chicken. Most protein is coming greek yogurt. Discussed other sources of protein.    Requests lorazepam refill for sleep.   Upcoming flu shot this Thursday.      Relevant past medical, surgical, family and social history reviewed and updated as indicated. Interim medical history since our last visit reviewed. Allergies and medications reviewed and updated. Outpatient Medications Prior to Visit  Medication Sig Dispense Refill   cetirizine (ZYRTEC) 10 MG tablet Take 10 mg by mouth daily as needed for allergies.     Cholecalciferol (VITAMIN D3) 25 MCG (1000 UT) CAPS Take 1 capsule by mouth daily.     co-enzyme Q-10 30 MG capsule Take 100 mg by mouth daily. URQuinol.     cyanocobalamin (VITAMIN B12) 1000 MCG tablet Take 1,000 mcg by mouth 2 (two) times daily.     diphenhydrAMINE (BENADRYL) 25 mg capsule Take 50 mg by mouth every 6 (six) hours as needed for allergies or itching.     fexofenadine (ALLEGRA) 180 MG tablet Take by mouth.     fluticasone (FLONASE) 50 MCG/ACT nasal spray Place into both nostrils daily.     ibuprofen (ADVIL) 200 MG tablet Take by mouth.     ketoconazole (NIZORAL) 2 % shampoo apply three times per week, massage into scalp and leave in for 10 minutes before rinsing out (Patient taking differently: as needed. apply three times per week, massage into scalp and leave in for 10 minutes before rinsing out) 120 mL 2   metoprolol tartrate (LOPRESSOR) 25 MG tablet Take 0.5  tablet (12.5 mg) by mouth twice daily as needed for tachycardia 60 tablet 3   mometasone (ELOCON) 0.1 % cream Apply once to twice daily to itchy rash on chest, shoulders, back until improved. Avoid face, groin, underarms. (Patient taking differently: as needed. Apply once to twice daily to itchy rash on chest, shoulders, back until improved. Avoid face, groin, underarms.) 45 g 1   MOTEGRITY 1 MG TABS TAKE 1 TABLET DAILY 90 tablet 3   Multiple Vitamin (MULTIVITAMIN ADULT) TABS Take 1 tablet by mouth daily. Nature Made MVI for Her     Oxymetazoline HCl 1 % CREA Apply to affected areas on face  every morning. 30 g 3   pimecrolimus (ELIDEL) 1 % cream Apply twice daily to affected body areas with KP 60 g 3   polyethylene glycol (MIRALAX / GLYCOLAX) packet Take 17 g by mouth daily. As needed     predniSONE (DELTASONE) 10 MG tablet 3 tabs by mouth daily x 2 days, then 2 tabs by mouth daily x 2 days then 1 tab by mouth daily x 2 days then 1/2 tab for 2 days 13 tablet 0   Ruxolitinib Phosphate (OPZELURA) 1.5 % CREA Apply to affected skin once a day 60 g 2   triamcinolone (NASACORT) 55 MCG/ACT AERO nasal inhaler Place 2 sprays into the nose daily. 1 Inhaler    Zinc 50 MG TABS Take by mouth. Takes 2 times weekly     LORazepam (ATIVAN) 0.5 MG tablet Take 0.5-1 tablets (0.25-0.5 mg total) by mouth at bedtime as needed for anxiety or sleep. 20 tablet 0   No facility-administered medications prior to visit.     Per HPI unless specifically indicated in ROS section below Review of Systems  Objective:  BP 116/70   Pulse 90   Temp 98.3 F (36.8 C) (Temporal)   Ht 5\' 7"  (1.702 m)   Wt 163 lb 8 oz (74.2 kg)   LMP 09/24/2023   SpO2 99%   BMI 25.61 kg/m   Wt Readings from Last 3 Encounters:  10/08/23 163 lb 8 oz (74.2 kg)  08/16/23 164 lb 4 oz (74.5 kg)  08/08/23 161 lb 6 oz (73.2 kg)      Physical Exam Vitals and nursing note reviewed.  Constitutional:      Appearance: Normal appearance. She is not ill-appearing.  HENT:     Head: Normocephalic and atraumatic.     Mouth/Throat:     Mouth: Mucous membranes are moist.     Pharynx: Oropharynx is clear. No oropharyngeal exudate or posterior oropharyngeal erythema.  Eyes:     Extraocular Movements: Extraocular movements intact.     Conjunctiva/sclera: Conjunctivae normal.     Pupils: Pupils are equal, round, and reactive to light.  Cardiovascular:     Rate and Rhythm: Normal rate and regular rhythm.     Pulses: Normal pulses.     Heart sounds: Normal heart sounds. No murmur heard. Pulmonary:     Effort: Pulmonary effort is normal.  No respiratory distress.     Breath sounds: Normal breath sounds. No wheezing, rhonchi or rales.  Musculoskeletal:        General: No swelling or tenderness.     Right lower leg: No edema.     Left lower leg: No edema.     Comments:  2+ rad pulses No residual bruising FROM R wrist, strength testing overall intact   Skin:    General: Skin is warm and dry.  Findings: No rash.  Neurological:     Mental Status: She is alert.  Psychiatric:        Mood and Affect: Mood normal.        Behavior: Behavior normal.       Results for orders placed or performed in visit on 08/08/23  Iron, TIBC and Ferritin Panel  Result Value Ref Range   Total Iron Binding Capacity 378 250 - 450 ug/dL   UIBC 098 119 - 147 ug/dL   Iron 69 27 - 829 ug/dL   Iron Saturation 18 15 - 55 %   Ferritin 16 15 - 150 ng/mL  Lipid panel  Result Value Ref Range   Cholesterol, Total 161 100 - 199 mg/dL   Triglycerides 92 0 - 149 mg/dL   HDL 68 >56 mg/dL   VLDL Cholesterol Cal 17 5 - 40 mg/dL   LDL Chol Calc (NIH) 76 0 - 99 mg/dL   Chol/HDL Ratio 2.4 0.0 - 4.4 ratio  Basic metabolic panel  Result Value Ref Range   Glucose 77 70 - 99 mg/dL   BUN 8 6 - 20 mg/dL   Creatinine, Ser 2.13 0.57 - 1.00 mg/dL   eGFR 086 >57 QI/ONG/2.95   BUN/Creatinine Ratio 12 9 - 23   Sodium 140 134 - 144 mmol/L   Potassium 4.2 3.5 - 5.2 mmol/L   Chloride 103 96 - 106 mmol/L   CO2 22 20 - 29 mmol/L   Calcium 9.5 8.7 - 10.2 mg/dL  CBC with Differential/Platelet  Result Value Ref Range   WBC 7.5 3.4 - 10.8 x10E3/uL   RBC 4.51 3.77 - 5.28 x10E6/uL   Hemoglobin 12.5 11.1 - 15.9 g/dL   Hematocrit 28.4 13.2 - 46.6 %   MCV 89 79 - 97 fL   MCH 27.7 26.6 - 33.0 pg   MCHC 31.2 (L) 31.5 - 35.7 g/dL   RDW 44.0 10.2 - 72.5 %   Platelets 266 150 - 450 x10E3/uL   Neutrophils 57 Not Estab. %   Lymphs 34 Not Estab. %   Monocytes 8 Not Estab. %   Eos 1 Not Estab. %   Basos 0 Not Estab. %   Neutrophils Absolute 4.3 1.4 - 7.0 x10E3/uL    Lymphocytes Absolute 2.5 0.7 - 3.1 x10E3/uL   Monocytes Absolute 0.6 0.1 - 0.9 x10E3/uL   EOS (ABSOLUTE) 0.1 0.0 - 0.4 x10E3/uL   Basophils Absolute 0.0 0.0 - 0.2 x10E3/uL   Immature Granulocytes 0 Not Estab. %   Immature Grans (Abs) 0.0 0.0 - 0.1 x10E3/uL  Copper, Serum  Result Value Ref Range   Copper 137 80 - 158 ug/dL  Zinc  Result Value Ref Range   Zinc 87 44 - 115 ug/dL  TSH  Result Value Ref Range   TSH 2.370 0.450 - 4.500 uIU/mL  VITAMIN D 25 Hydroxy (Vit-D Deficiency, Fractures)  Result Value Ref Range   Vit D, 25-Hydroxy 30.2 30.0 - 100.0 ng/mL  Vitamin B12  Result Value Ref Range   Vitamin B-12 694 232 - 1,245 pg/mL    Assessment & Plan:   Problem List Items Addressed This Visit     Chronic fatigue   Insomnia    Requests lorazepam refill.       Relevant Medications   LORazepam (ATIVAN) 0.5 MG tablet   Low iron stores    Ferritin low - endorses heavier periods since stopping OCP.  Will restart low dose oral iron (low-FE) every other day during period week.  She has previously tolerated low dose well without constipation.       IgA deficiency (HCC)    With [place] - saw immunology, tested negative for mast cell activation disorder, rec f/u PRN or if recurrent symptoms.       Pain and swelling of right wrist - Primary    Significant improvement after large bruise with swelling and limitations after vertebral artery angiogram 08/03/2023. Prednisone taper did help with swelling.  Notes persistent limitations when it comes to bearing weight, lifting >5 lbs, or forced wrist extension against resistance.  Will refer for PT course per interventional radiology recommendations.       Relevant Orders   Ambulatory referral to Physical Therapy   OAB (overactive bladder)    Established with [facility] pending PFPT, to consider PTNS.         Meds ordered this encounter  Medications   LORazepam (ATIVAN) 0.5 MG tablet    Sig: Take 0.5-1 tablets (0.25-0.5 mg  total) by mouth at bedtime as needed for anxiety or sleep.    Dispense:  20 tablet    Refill:  0   Ferrous Sulfate Dried 143 (45 Fe) MG TBCR    Sig: Take 1 tablet by mouth every other day. As needed    Orders Placed This Encounter  Procedures   Ambulatory referral to Physical Therapy    Referral Priority:   Routine    Referral Type:   Physical Medicine    Referral Reason:   Specialty Services Required    Requested Specialty:   Physical Therapy    Number of Visits Requested:   1    Patient Instructions  We will refer you to [facility] physical therapy.  Start low iron over the counter supplement every other day during weeks of period Try plant-based sources of protein, consider protein supplements.  Good to see you today Return in 2-3 months for physical.   Follow up plan: Return in about 2 months (around 12/08/2023), or if symptoms worsen or fail to improve, for annual exam, prior fasting for blood work.  Eustaquio Boyden, MD

## 2023-10-11 ENCOUNTER — Ambulatory Visit (INDEPENDENT_AMBULATORY_CARE_PROVIDER_SITE_OTHER): Payer: BC Managed Care – PPO

## 2023-10-11 DIAGNOSIS — Z23 Encounter for immunization: Secondary | ICD-10-CM

## 2023-10-18 ENCOUNTER — Telehealth: Payer: Self-pay | Admitting: Cardiovascular Disease

## 2023-10-18 DIAGNOSIS — N83201 Unspecified ovarian cyst, right side: Secondary | ICD-10-CM | POA: Diagnosis not present

## 2023-10-18 DIAGNOSIS — Z01419 Encounter for gynecological examination (general) (routine) without abnormal findings: Secondary | ICD-10-CM | POA: Diagnosis not present

## 2023-10-18 DIAGNOSIS — Z1331 Encounter for screening for depression: Secondary | ICD-10-CM | POA: Diagnosis not present

## 2023-10-18 NOTE — Telephone Encounter (Signed)
Called and spoke with patient. Patient states that  when she is motion in a car or elevator she starts losing her vision and her heart rate becomes elevated. Patient reports that she has been cleared by neurology for the symptoms. Patient was told that it is probably POTS or dysautonomia. The patient wants to know if Dr. Mariah Milling has any recommendations for testing for POTS or dysautonomia prior to appointment on 10/29/23. Patient states that if he is not going to be able to test for these that she has a referral to another cardiologist and will follow up with them. Will forward to MD for recommendations.

## 2023-10-18 NOTE — Telephone Encounter (Signed)
Received referral from Gynecologist scheduled patient to see Dr. Mariah Milling on 10/22/23. Has concerns that have not been resolved in pasted visits. Please call to discuss before here next appt.

## 2023-10-19 NOTE — Telephone Encounter (Signed)
Called patient and left message for call back.

## 2023-10-22 ENCOUNTER — Ambulatory Visit: Payer: BC Managed Care – PPO | Admitting: Cardiovascular Disease

## 2023-10-24 NOTE — Telephone Encounter (Signed)
Called and notified patient of the following from Dr. Mariah Milling.  We do not have a Dysautonomia Clinic like they do at [facility],  Would consider eval through the [facility]  I don't have additional testing to perform  Thx  TGollan   Patient verbalizes understanding. Patient requesting an appointment with Dr. Servando Salina at [facility]. Patient states that her OB placed a referral for her to see Dr. Servando Salina. Message sent to scheduler.

## 2023-10-29 ENCOUNTER — Ambulatory Visit: Payer: BC Managed Care – PPO | Admitting: Cardiovascular Disease

## 2023-11-07 ENCOUNTER — Encounter: Payer: Self-pay | Admitting: Family Medicine

## 2023-11-07 DIAGNOSIS — M25431 Effusion, right wrist: Secondary | ICD-10-CM

## 2023-11-07 DIAGNOSIS — R42 Dizziness and giddiness: Secondary | ICD-10-CM

## 2023-11-22 ENCOUNTER — Telehealth: Payer: BC Managed Care – PPO | Admitting: Physician Assistant

## 2023-11-22 DIAGNOSIS — B9689 Other specified bacterial agents as the cause of diseases classified elsewhere: Secondary | ICD-10-CM | POA: Diagnosis not present

## 2023-11-22 DIAGNOSIS — J019 Acute sinusitis, unspecified: Secondary | ICD-10-CM

## 2023-11-22 MED ORDER — FLUCONAZOLE 150 MG PO TABS: ORAL_TABLET | ORAL | 0 refills | Status: DC

## 2023-11-22 MED ORDER — PREDNISONE 20 MG PO TABS
40.0000 mg | ORAL_TABLET | Freq: Every day | ORAL | 0 refills | Status: DC
Start: 2023-11-22 — End: 2023-11-22

## 2023-11-22 MED ORDER — AMOXICILLIN 500 MG PO CAPS
500.0000 mg | ORAL_CAPSULE | Freq: Three times a day (TID) | ORAL | 0 refills | Status: AC
Start: 2023-11-22 — End: 2023-12-02

## 2023-11-22 MED ORDER — PREDNISONE 10 MG PO TABS
ORAL_TABLET | ORAL | 0 refills | Status: DC
Start: 2023-11-22 — End: 2024-01-11

## 2023-11-22 NOTE — Progress Notes (Signed)
Virtual Visit Consent   MELITZA LISIECKI, you are scheduled for a virtual visit with a [facility] provider today. Just as with appointments in the office, your consent must be obtained to participate. Your consent will be active for this visit and any virtual visit you may have with one of our providers in the next 365 days. If you have a MyChart account, a copy of this consent can be sent to you electronically.  As this is a virtual visit, video technology does not allow for your provider to perform a traditional examination. This may limit your provider's ability to fully assess your condition. If your provider identifies any concerns that need to be evaluated in person or the need to arrange testing (such as labs, EKG, etc.), we will make arrangements to do so. Although advances in technology are sophisticated, we cannot ensure that it will always work on either your end or our end. If the connection with a video visit is poor, the visit may have to be switched to a telephone visit. With either a video or telephone visit, we are not always able to ensure that we have a secure connection.  By engaging in this virtual visit, you consent to the provision of healthcare and authorize for your insurance to be billed (if applicable) for the services provided during this visit. Depending on your insurance coverage, you may receive a charge related to this service.  I need to obtain your verbal consent now. Are you willing to proceed with your visit today? ANISTASIA SILVERA has provided verbal consent on 11/22/2023 for a virtual visit (video or telephone). [name], [place]  Date: 11/22/2023 6:08 PM  Virtual Visit via Video Note   I, [name], connected with  ESRAA BRAUTIGAM  ([id number], 07-25-1991) on 11/22/23 at  6:15 PM EST by a video-enabled telemedicine application and verified that I am speaking with the correct person using two identifiers.  Location: Patient: Virtual Visit Location  Patient: Home Provider: Virtual Visit Location Provider: Home Office   I discussed the limitations of evaluation and management by telemedicine and the availability of in person appointments. The patient expressed understanding and agreed to proceed.    History of Present Illness: KESHIA HOEK is a 32 y.o. who identifies as a female who was assigned female at birth, and is being seen today for possible sinusitis. Endorses symptoms starting about a few weeks ago with nasal and head congestion, epistaxis, sinus pressure/pain and pnd with AM sore throat and hoarseness. Initially thought was related to allergies and a cold since mother initially had similar symptoms. Has been taking her allergy medications OTC as directed along with Vitamins and Ibuprofen. As of this morning symptoms continue to progress.  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   Bee sting 07/25/2022   History of iron deficiency 07/19/2022   Allergy status to other drugs, medicaments and biological substances 06/24/2022   Hypertriglyceridemia 03/02/2022   Low iron stores 03/02/2022   Telogen  effluvium 12/29/2021   High blood copper level 10/07/2021   Vitamin B12 deficiency 10/07/2021   Hand swelling 09/29/2021   Hives 08/16/2021   Paroxysmal tachycardia (HCC) 05/12/2021   Insomnia 10/20/2020   Paresthesias 09/25/2020   Vasovagal near-syncope 09/17/2019   Orthostatic intolerance 06/20/2019   Chronic fatigue 06/26/2018   Seasonal allergic rhinitis     Pineal gland cyst 01/31/2016   White matter abnormality on MRI of brain 01/31/2016   Vitamin D deficiency 05/30/2010   CONSTIPATION, CHRONIC 05/30/2010   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 Caused rash all over.    Medications:  Current Outpatient Medications:    amoxicillin (AMOXIL) 500 MG capsule, Take 1 capsule (500 mg total) by mouth 3 (three) times daily for 10 days., Disp: 30 capsule, Rfl: 0   fluconazole (DIFLUCAN) 150 MG tablet, Take 1 tablet PO once. Repeat in 3 days if needed., Disp: 2 tablet, Rfl: 0   predniSONE (DELTASONE) 20 MG tablet, Take 2 tablets (40 mg total) by mouth daily with breakfast., Disp: 10 tablet, Rfl: 0   cetirizine (ZYRTEC) 10 MG tablet, Take 10 mg by mouth daily as needed for allergies., Disp: , Rfl:    Cholecalciferol (VITAMIN D3) 25 MCG (1000 UT) CAPS, Take 1 capsule by mouth daily., Disp: , Rfl:    co-enzyme Q-10 30 MG capsule, Take 100 mg by mouth daily. URQuinol., Disp: , Rfl:    cyanocobalamin (VITAMIN B12) 1000 MCG tablet, Take 1,000 mcg by mouth 2 (two) times daily., Disp: , Rfl:    diphenhydrAMINE (BENADRYL) 25 mg capsule, Take 50 mg by mouth every 6 (six) hours as needed for allergies or itching., Disp: , Rfl:    Ferrous Sulfate Dried 143 (45 Fe) MG TBCR, Take 1 tablet by mouth every other day. As needed, Disp: , Rfl:    fexofenadine (ALLEGRA) 180 MG tablet, Take by mouth., Disp: , Rfl:    fluticasone (FLONASE) 50 MCG/ACT nasal spray, Place into both nostrils daily., Disp: , Rfl:    ibuprofen (ADVIL) 200 MG tablet, Take by mouth., Disp: , Rfl:    ketoconazole (NIZORAL) 2 %  shampoo, apply three times per week, massage into scalp and leave in for 10 minutes before rinsing out (Patient taking differently: as needed. apply three times per week, massage into scalp and leave in for 10 minutes before rinsing out), Disp: 120 mL, Rfl: 2   LORazepam (ATIVAN) 0.5 MG tablet, Take 0.5-1 tablets (0.25-0.5 mg total) by mouth at bedtime as needed for anxiety or sleep., Disp: 20 tablet, Rfl: 0   metoprolol tartrate (LOPRESSOR) 25 MG tablet, Take 0.5 tablet (12.5 mg) by mouth twice daily as needed for tachycardia, Disp: 60 tablet, Rfl: 3   mometasone (ELOCON) 0.1 % cream, Apply once to twice daily to itchy rash on chest, shoulders, back until improved. Avoid face, groin, underarms. (Patient taking differently: as needed. Apply once to twice daily to itchy rash on chest, shoulders, back until improved. Avoid face, groin, underarms.), Disp: 45 g, Rfl: 1   MOTEGRITY 1 MG TABS, TAKE 1 TABLET DAILY,  Disp: 90 tablet, Rfl: 3   Multiple Vitamin (MULTIVITAMIN ADULT) TABS, Take 1 tablet by mouth daily. Nature Made MVI for Her, Disp: , Rfl:    Oxymetazoline HCl 1 % CREA, Apply to affected areas on face every morning., Disp: 30 g, Rfl: 3   pimecrolimus (ELIDEL) 1 % cream, Apply twice daily to affected body areas with KP, Disp: 60 g, Rfl: 3   polyethylene glycol (MIRALAX / GLYCOLAX) packet, Take 17 g by mouth daily. As needed, Disp: , Rfl:    Ruxolitinib Phosphate (OPZELURA) 1.5 % CREA, Apply to affected skin once a day, Disp: 60 g, Rfl: 2   triamcinolone (NASACORT) 55 MCG/ACT AERO nasal inhaler, Place 2 sprays into the nose daily., Disp: 1 Inhaler, Rfl:    Zinc 50 MG TABS, Take by mouth. Takes 2 times weekly, Disp: , Rfl:   Observations/Objective: Patient is well-developed, well-nourished in no acute distress.  Resting comfortably  at home.  Head is normocephalic, atraumatic.  No labored breathing. Speech is clear and coherent with logical content.  Patient is alert and oriented at baseline.    Assessment and Plan: 1. Acute bacterial sinusitis - amoxicillin (AMOXIL) 500 MG capsule; Take 1 capsule (500 mg total) by mouth 3 (three) times daily for 10 days.  Dispense: 30 capsule; Refill: 0 - predniSONE (DELTASONE) 20 MG tablet; Take 2 tablets (40 mg total) by mouth daily with breakfast.  Dispense: 10 tablet; Refill: 0  Rx Amox 500 mg TID x 10 days since she does not tolerate higher doses or combination (augmenting) and due to her other medication intolerances  Increase fluids.  Rest.  Saline nasal spray.  Probiotic.  Mucinex as directed.  Humidifier in bedroom. Continue OTC antihistamine, adding back in Flonase. Giving duration and prior history, will add on a short taper of prednisone.  Call or return to clinic if symptoms are not improving.   Follow Up Instructions: I discussed the assessment and treatment plan with the patient. The patient was provided an opportunity to ask questions and all were answered. The patient agreed with the plan and demonstrated an understanding of the instructions.  A copy of instructions were sent to the patient via MyChart unless otherwise noted below.   The patient was advised to call back or seek an in-person evaluation if the symptoms worsen or if the condition fails to improve as anticipated.    [name], PA-C

## 2023-11-22 NOTE — Patient Instructions (Signed)
[name], thank you for joining [name], PA-C for today's virtual visit.  While this provider is not your primary care provider (PCP), if your PCP is located in our provider database this encounter information will be shared with them immediately following your visit.   A [facility] MyChart account gives you access to today's visit and all your visits, tests, and labs performed at [facility] " click here if you don't have a [facility] MyChart account or go to [email or web address]  Consent: (Patient) Caroline [name] provided verbal consent for this virtual visit at the beginning of the encounter.  Current Medications:  Current Outpatient Medications:    cetirizine (ZYRTEC) 10 MG tablet, Take 10 mg by mouth daily as needed for allergies., Disp: , Rfl:    Cholecalciferol (VITAMIN D3) 25 MCG (1000 UT) CAPS, Take 1 capsule by mouth daily., Disp: , Rfl:    co-enzyme Q-10 30 MG capsule, Take 100 mg by mouth daily. URQuinol., Disp: , Rfl:    cyanocobalamin (VITAMIN B12) 1000 MCG tablet, Take 1,000 mcg by mouth 2 (two) times daily., Disp: , Rfl:    diphenhydrAMINE (BENADRYL) 25 mg capsule, Take 50 mg by mouth every 6 (six) hours as needed for allergies or itching., Disp: , Rfl:    Ferrous Sulfate Dried 143 (45 Fe) MG TBCR, Take 1 tablet by mouth every other day. As needed, Disp: , Rfl:    fexofenadine (ALLEGRA) 180 MG tablet, Take by mouth., Disp: , Rfl:    fluticasone (FLONASE) 50 MCG/ACT nasal spray, Place into both nostrils daily., Disp: , Rfl:    ibuprofen (ADVIL) 200 MG tablet, Take by mouth., Disp: , Rfl:    ketoconazole (NIZORAL) 2 % shampoo, apply three times per week, massage into scalp and leave in for 10 minutes before rinsing out (Patient taking differently: as needed. apply three times per week, massage into scalp and leave in for 10 minutes before rinsing out), Disp: 120 mL, Rfl: 2   LORazepam (ATIVAN) 0.5 MG tablet, Take 0.5-1 tablets (0.25-0.5 mg total)  by mouth at bedtime as needed for anxiety or sleep., Disp: 20 tablet, Rfl: 0   metoprolol tartrate (LOPRESSOR) 25 MG tablet, Take 0.5 tablet (12.5 mg) by mouth twice daily as needed for tachycardia, Disp: 60 tablet, Rfl: 3   mometasone (ELOCON) 0.1 % cream, Apply once to twice daily to itchy rash on chest, shoulders, back until improved. Avoid face, groin, underarms. (Patient taking differently: as needed. Apply once to twice daily to itchy rash on chest, shoulders, back until improved. Avoid face, groin, underarms.), Disp: 45 g, Rfl: 1   MOTEGRITY 1 MG TABS, TAKE 1 TABLET DAILY, Disp: 90 tablet, Rfl: 3   Multiple Vitamin (MULTIVITAMIN ADULT) TABS, Take 1 tablet by mouth daily. Nature Made MVI for Her, Disp: , Rfl:    Oxymetazoline HCl 1 % CREA, Apply to affected areas on face every morning., Disp: 30 g, Rfl: 3   pimecrolimus (ELIDEL) 1 % cream, Apply twice daily to affected body areas with KP, Disp: 60 g, Rfl: 3   polyethylene glycol (MIRALAX / GLYCOLAX) packet, Take 17 g by mouth daily. As needed, Disp: , Rfl:    predniSONE (DELTASONE) 10 MG tablet, 3 tabs by mouth daily x 2 days, then 2 tabs by mouth daily x 2 days then 1 tab by mouth daily x 2 days then 1/2 tab for 2 days, Disp: 13 tablet, Rfl: 0   Ruxolitinib Phosphate (OPZELURA) 1.5 % CREA, Apply to  affected skin once a day, Disp: 60 g, Rfl: 2   triamcinolone (NASACORT) 55 MCG/ACT AERO nasal inhaler, Place 2 sprays into the nose daily., Disp: 1 Inhaler, Rfl:    Zinc 50 MG TABS, Take by mouth. Takes 2 times weekly, Disp: , Rfl:    Medications ordered in this encounter:  No orders of the defined types were placed in this encounter.    *If you need refills on other medications prior to your next appointment, please contact your pharmacy*  Follow-Up: Call back or seek an in-person evaluation if the symptoms worsen or if the condition fails to improve as anticipated.  [facility] [phone]  Other Instructions Please take  antibiotic as directed.  Increase fluid intake.  Use Saline nasal spray.  Take a daily multivitamin. Continue your allergy medications and restart the Flonase.  Place a humidifier in the bedroom.  If no substantial improvement over the next few days, add on the prednisone I have sent in, taking as directed. Please call or return clinic if symptoms are not improving.  Sinusitis Sinusitis is redness, soreness, and swelling (inflammation) of the paranasal sinuses. Paranasal sinuses are air pockets within the bones of your face (beneath the eyes, the middle of the forehead, or above the eyes). In healthy paranasal sinuses, mucus is able to drain out, and air is able to circulate through them by way of your nose. However, when your paranasal sinuses are inflamed, mucus and air can become trapped. This can allow bacteria and other germs to grow and cause infection. Sinusitis can develop quickly and last only a short time (acute) or continue over a long period (chronic). Sinusitis that lasts for more than 12 weeks is considered chronic.  CAUSES  Causes of sinusitis include: Allergies. Structural abnormalities, such as displacement of the cartilage that separates your nostrils (deviated septum), which can decrease the air flow through your nose and sinuses and affect sinus drainage. Functional abnormalities, such as when the small hairs (cilia) that line your sinuses and help remove mucus do not work properly or are not present. SYMPTOMS  Symptoms of acute and chronic sinusitis are the same. The primary symptoms are pain and pressure around the affected sinuses. Other symptoms include: Upper toothache. Earache. Headache. Bad breath. Decreased sense of smell and taste. A cough, which worsens when you are lying flat. Fatigue. Fever. Thick drainage from your nose, which often is green and may contain pus (purulent). Swelling and warmth over the affected sinuses. DIAGNOSIS  Your caregiver will perform a  physical exam. During the exam, your caregiver may: Look in your nose for signs of abnormal growths in your nostrils (nasal polyps). Tap over the affected sinus to check for signs of infection. View the inside of your sinuses (endoscopy) with a special imaging device with a light attached (endoscope), which is inserted into your sinuses. If your caregiver suspects that you have chronic sinusitis, one or more of the following tests may be recommended: Allergy tests. Nasal culture A sample of mucus is taken from your nose and sent to a lab and screened for bacteria. Nasal cytology A sample of mucus is taken from your nose and examined by your caregiver to determine if your sinusitis is related to an allergy. TREATMENT  Most cases of acute sinusitis are related to a viral infection and will resolve on their own within 10 days. Sometimes medicines are prescribed to help relieve symptoms (pain medicine, decongestants, nasal steroid sprays, or saline sprays).  However,  for sinusitis related to a bacterial infection, your caregiver will prescribe antibiotic medicines. These are medicines that will help kill the bacteria causing the infection.  Rarely, sinusitis is caused by a fungal infection. In theses cases, your caregiver will prescribe antifungal medicine. For some cases of chronic sinusitis, surgery is needed. Generally, these are cases in which sinusitis recurs more than 3 times per year, despite other treatments. HOME CARE INSTRUCTIONS  Drink plenty of water. Water helps thin the mucus so your sinuses can drain more easily. Use a humidifier. Inhale steam 3 to 4 times a day (for example, sit in the bathroom with the shower running). Apply a warm, moist washcloth to your face 3 to 4 times a day, or as directed by your caregiver. Use saline nasal sprays to help moisten and clean your sinuses. Take over-the-counter or prescription medicines for pain, discomfort, or fever only as directed by your  caregiver. SEEK IMMEDIATE MEDICAL CARE IF: You have increasing pain or severe headaches. You have nausea, vomiting, or drowsiness. You have swelling around your face. You have vision problems. You have a stiff neck. You have difficulty breathing. MAKE SURE YOU:  Understand these instructions. Will watch your condition. Will get help right away if you are not doing well or get worse. Document Released: 12/11/2005 Document Revised: 03/04/2012 Document Reviewed: 12/26/2011 [facility] Patient Information 2014 [place], [place].    If you have been instructed to have an in-person evaluation today at a local Urgent Care facility, please use the link below. It will take you to a list of all of our available [facility], including address, phone number and hours of operation. Please do not delay care.  [facility]  If you or a family member do not have a primary care provider, use the link below to schedule a visit and establish care. When you choose a [facility] primary care physician or advanced practice provider, you gain a long-term partner in health. Find a Primary Care Provider  Learn more about [facility]'s in-office and virtual care options: [facility] - Get Care Now

## 2023-11-26 DIAGNOSIS — R42 Dizziness and giddiness: Secondary | ICD-10-CM | POA: Diagnosis not present

## 2023-12-03 DIAGNOSIS — R42 Dizziness and giddiness: Secondary | ICD-10-CM | POA: Diagnosis not present

## 2023-12-17 DIAGNOSIS — R42 Dizziness and giddiness: Secondary | ICD-10-CM | POA: Diagnosis not present

## 2023-12-24 DIAGNOSIS — R42 Dizziness and giddiness: Secondary | ICD-10-CM | POA: Diagnosis not present

## 2023-12-27 ENCOUNTER — Telehealth: Payer: Self-pay | Admitting: Family Medicine

## 2023-12-27 NOTE — Telephone Encounter (Signed)
 Copied from CRM [id number]. Topic: Referral - Request for Referral >> Dec 25, 2023  3:40 PM Antonio DEL wrote: Did the patient discuss referral with their provider in the last year? Yes, he referred to ENT in the summer but patient wants a second opinion  (If No - schedule appointment) (If Yes - send message)  Appointment offered? Yes  Type of order/referral and detailed reason for visit: ENT  Preference of office, provider, location: [facility], [facility], [phone]  If referral order, have you been seen by this specialty before? Yes, saw one before in the summer. With [facility]. Same issue  (If Yes, this issue or another issue? When? Where?  Can we respond through MyChart? Yes

## 2023-12-28 NOTE — Telephone Encounter (Signed)
 I have sent a records request to Dr Elenore Rota.

## 2023-12-28 NOTE — Telephone Encounter (Signed)
 Sent MyChart message to pt.

## 2023-12-28 NOTE — Telephone Encounter (Addendum)
 I don't see where I've referred her to ENT in the past.  She's seen ENT Dr. Elenore Rota in the past 03/2023 and prior 2021 - but I don't see records of ENT evaluation in chart. Can we request records?  May recommend OV to review.

## 2023-12-31 DIAGNOSIS — R42 Dizziness and giddiness: Secondary | ICD-10-CM | POA: Diagnosis not present

## 2024-01-01 NOTE — Telephone Encounter (Signed)
 Called patient and lvm for her to cb. Wanted to inform patient that [facility] sent over a medical release form in order to send the recrds over to [place]. Patient can come by office and sign our form or go to [facility] and fill the form out. Thank yoU!

## 2024-01-08 DIAGNOSIS — R42 Dizziness and giddiness: Secondary | ICD-10-CM | POA: Diagnosis not present

## 2024-01-11 ENCOUNTER — Ambulatory Visit (INDEPENDENT_AMBULATORY_CARE_PROVIDER_SITE_OTHER): Payer: BC Managed Care – PPO | Admitting: Family Medicine

## 2024-01-11 VITALS — BP 116/70 | HR 89 | Temp 98.1°F | Ht 67.0 in | Wt 164.0 lb

## 2024-01-11 DIAGNOSIS — E559 Vitamin D deficiency, unspecified: Secondary | ICD-10-CM

## 2024-01-11 DIAGNOSIS — M25531 Pain in right wrist: Secondary | ICD-10-CM

## 2024-01-11 DIAGNOSIS — K5909 Other constipation: Secondary | ICD-10-CM | POA: Diagnosis not present

## 2024-01-11 DIAGNOSIS — R5382 Chronic fatigue, unspecified: Secondary | ICD-10-CM | POA: Diagnosis not present

## 2024-01-11 DIAGNOSIS — I479 Paroxysmal tachycardia, unspecified: Secondary | ICD-10-CM

## 2024-01-11 DIAGNOSIS — R7879 Finding of abnormal level of heavy metals in blood: Secondary | ICD-10-CM

## 2024-01-11 DIAGNOSIS — M25431 Effusion, right wrist: Secondary | ICD-10-CM

## 2024-01-11 DIAGNOSIS — N3281 Overactive bladder: Secondary | ICD-10-CM

## 2024-01-11 DIAGNOSIS — H539 Unspecified visual disturbance: Secondary | ICD-10-CM

## 2024-01-11 DIAGNOSIS — R42 Dizziness and giddiness: Secondary | ICD-10-CM

## 2024-01-11 DIAGNOSIS — D802 Selective deficiency of immunoglobulin A [IgA]: Secondary | ICD-10-CM

## 2024-01-11 DIAGNOSIS — R55 Syncope and collapse: Secondary | ICD-10-CM

## 2024-01-11 DIAGNOSIS — G4700 Insomnia, unspecified: Secondary | ICD-10-CM

## 2024-01-11 DIAGNOSIS — Z0000 Encounter for general adult medical examination without abnormal findings: Secondary | ICD-10-CM | POA: Diagnosis not present

## 2024-01-11 DIAGNOSIS — J329 Chronic sinusitis, unspecified: Secondary | ICD-10-CM

## 2024-01-11 DIAGNOSIS — R790 Abnormal level of blood mineral: Secondary | ICD-10-CM

## 2024-01-11 DIAGNOSIS — K599 Functional intestinal disorder, unspecified: Secondary | ICD-10-CM

## 2024-01-11 DIAGNOSIS — E538 Deficiency of other specified B group vitamins: Secondary | ICD-10-CM

## 2024-01-11 DIAGNOSIS — I951 Orthostatic hypotension: Secondary | ICD-10-CM

## 2024-01-11 NOTE — Patient Instructions (Addendum)
Good to see you today  Labs ordered to [facility] today We will refer you to [facility] for vestibular evaluation  Good to see you today We will request [facility] records for evaluation from 2021 and 2024.

## 2024-01-11 NOTE — Assessment & Plan Note (Signed)
Preventative protocols reviewed and updated unless pt declined. Discussed healthy diet and lifestyle.  

## 2024-01-11 NOTE — Progress Notes (Unsigned)
Ph: [phone] Fax: [phone]   Patient ID: Caroline [name], female    DOB: 1991/10/17, 33 y.o.   MRN: [id number]  This visit was conducted in person.  BP 116/70   Pulse 89   Temp 98.1 F (36.7 C) (Oral)   Ht 5\' 7"  (1.702 m)   Wt 164 lb (74.4 kg)   LMP 01/07/2024   SpO2 99%   BMI 25.69 kg/m    CC: CPE Subjective:   HPI: Caroline [name] is a 33 y.o. female presenting on 01/11/2024 for Annual Exam   Requests lorazepam refilled for sleep.  Currently undergoing vestibular rehab through [facility].   Requests referral to ENT for vestibular eval Dr Margarito Courser at [facility]. Denies significant vertigo, but feels persistent unsteadiness with poor balance. Easy imbalance with head shakes. No driving since 65/06/8468 - due to presyncope and dizziness. Works from home. Mother drove her today.  Wonders about neuro-ophthalmology evaluation - feels eyes aren't tracking right - tracking eyes causes eye stutter. Trouble seeing out of glasses - has 3 pairs. Last saw eye doctor 09/2023.   Saw [facility] 08/2023 for OAB.  She has upcoming [facility]OT[facility]/dysautonomia clinic 03/2024  On Motegrity for colonic inertia - helpful but causing urinary incontinence.   Echocardiogram December 07, 2022: Normal study, normal ejection fraction   Zio monitor 11/2022: no significant arrhythmia, normal sinus rhythm. Triggered events associated with normal rhythm  Preventative: Colon cancer screening - not due Lung cancer screening - not eligible  Well woman exam - with [facility] Dr Clance Boll last seen for well woman exam - no paps as never sexually active. Seeing yearly.  DEXA scan - not due Flu shot - 09/2023  COVID shot - declined  Tdap 2009, Td 07/2022 Pneumonia shot - not due Shingrix - not due  Seat belt use discussed.  Sunscreen use discussed. No changing moles on skin. Sees skin doctor regularly ([place] --> upcoming Gwen Pounds).  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 Interim medical history since our last visit reviewed. Allergies and medications reviewed and updated. Outpatient Medications Prior to Visit  Medication Sig Dispense Refill   cetirizine (ZYRTEC) 10 MG tablet Take 10 mg by mouth daily as needed for allergies.     Cholecalciferol (VITAMIN D3) 25 MCG (1000 UT) CAPS Take 1 capsule by mouth daily.     co-enzyme Q-10 30 MG capsule Take 100 mg by mouth daily. URQuinol.     cyanocobalamin (VITAMIN B12) 1000 MCG tablet Take 1,000 mcg by mouth 2 (two) times daily.     diphenhydrAMINE (BENADRYL) 25 mg capsule Take 50 mg by mouth every 6 (six) hours as needed for allergies or itching.     Ferrous Sulfate (IRON PO) Take by mouth as needed.     fexofenadine (ALLEGRA) 180 MG tablet Take by mouth.     fluticasone (FLONASE)  50 MCG/ACT nasal spray Place into both nostrils daily.     ibuprofen (ADVIL) 200 MG tablet Take by mouth.     ketoconazole (NIZORAL) 2 % shampoo apply three times per week, massage into scalp and leave in for 10 minutes before rinsing out (Patient taking differently: as needed. apply three times per week, massage into scalp and leave in for 10 minutes before rinsing out) 120 mL 2   LORazepam (ATIVAN) 0.5 MG tablet Take 0.5-1 tablets (0.25-0.5 mg total) by mouth at bedtime as needed for anxiety or sleep. 20 tablet 0   metoprolol tartrate  (LOPRESSOR) 25 MG tablet Take 0.5 tablet (12.5 mg) by mouth twice daily as needed for tachycardia 60 tablet 3   mometasone (ELOCON) 0.1 % cream Apply once to twice daily to itchy rash on chest, shoulders, back until improved. Avoid face, groin, underarms. (Patient taking differently: as needed. Apply once to twice daily to itchy rash on chest, shoulders, back until improved. Avoid face, groin, underarms.) 45 g 1   MOTEGRITY 1 MG TABS TAKE 1 TABLET DAILY 90 tablet 3   Multiple Vitamin (MULTIVITAMIN ADULT) TABS Take 1 tablet by mouth daily. Nature Made MVI for Her     Oxymetazoline HCl 1 % CREA Apply to affected areas on face every morning. 30 g 3   pimecrolimus (ELIDEL) 1 % cream Apply twice daily to affected body areas with KP 60 g 3   polyethylene glycol (MIRALAX / GLYCOLAX) packet Take 17 g by mouth daily. As needed     Ruxolitinib Phosphate (OPZELURA) 1.5 % CREA Apply to affected skin once a day 60 g 2   triamcinolone (NASACORT) 55 MCG/ACT AERO nasal inhaler Place 2 sprays into the nose daily. 1 Inhaler    Zinc 50 MG TABS Take by mouth. Takes 2 times weekly     Ferrous Sulfate Dried 143 (45 Fe) MG TBCR Take 1 tablet by mouth every other day. As needed     fluconazole (DIFLUCAN) 150 MG tablet Take 1 tablet PO once. Repeat in 3 days if needed. 2 tablet 0   predniSONE (DELTASONE) 10 MG tablet 3 tabs by mouth daily x 2 days, then 2 tabs by mouth daily x 2 days then 1 tab by mouth daily x 2 days then 1/2 tab for 2 days 13 tablet 0   No facility-administered medications prior to visit.     Per HPI unless specifically indicated in ROS section below Review of Systems  Constitutional:  Positive for chills. Negative for activity change, appetite change, fatigue, fever and unexpected weight change.  HENT:  Positive for nosebleeds (dry environment). Negative for hearing loss.   Eyes:  Positive for visual disturbance.  Respiratory:  Positive for shortness of breath. Negative for cough, chest tightness  and wheezing.   Cardiovascular:  Positive for palpitations and leg swelling (feet swell with close toed shoes). Negative for chest pain.  Gastrointestinal:  Positive for abdominal pain, constipation and nausea. Negative for abdominal distention, blood in stool, diarrhea and vomiting.  Genitourinary:  Negative for difficulty urinating and hematuria.  Musculoskeletal:  Negative for arthralgias, myalgias and neck pain.  Skin:  Negative for rash.  Neurological:  Positive for dizziness, light-headedness and headaches. Negative for seizures and syncope.  Hematological:  Negative for adenopathy. Bruises/bleeds easily.  Psychiatric/Behavioral:  Negative for dysphoric mood. The patient is not nervous/anxious.     Objective:  BP 116/70   Pulse 89   Temp 98.1 F (36.7 C) (Oral)   Ht 5'  7" (1.702 m)   Wt 164 lb (74.4 kg)   LMP 01/07/2024   SpO2 99%   BMI 25.69 kg/m   Wt Readings from Last 3 Encounters:  01/11/24 164 lb (74.4 kg)  10/08/23 163 lb 8 oz (74.2 kg)  08/16/23 164 lb 4 oz (74.5 kg)      Physical Exam Vitals and nursing note reviewed.  Constitutional:      Appearance: Normal appearance. She is not ill-appearing.  HENT:     Head: Normocephalic and atraumatic.     Right Ear: Tympanic membrane, ear canal and external ear normal. There is no impacted cerumen.     Left Ear: Tympanic membrane, ear canal and external ear normal. There is no impacted cerumen.     Mouth/Throat:     Mouth: Mucous membranes are moist.     Pharynx: Oropharynx is clear. No oropharyngeal exudate or posterior oropharyngeal erythema.  Eyes:     General:        Right eye: No discharge.        Left eye: No discharge.     Extraocular Movements: Extraocular movements intact.     Conjunctiva/sclera: Conjunctivae normal.     Pupils: Pupils are equal, round, and reactive to light.  Neck:     Thyroid: No thyroid mass or thyromegaly.  Cardiovascular:     Rate and Rhythm: Normal rate and regular rhythm.      Pulses: Normal pulses.     Heart sounds: Normal heart sounds. No murmur heard. Pulmonary:     Effort: Pulmonary effort is normal. No respiratory distress.     Breath sounds: Normal breath sounds. No wheezing, rhonchi or rales.  Abdominal:     General: Bowel sounds are normal. There is no distension.     Palpations: Abdomen is soft. There is no mass.     Tenderness: There is no abdominal tenderness. There is no guarding or rebound.     Hernia: No hernia is present.  Musculoskeletal:     Cervical back: Normal range of motion and neck supple. No rigidity.     Right lower leg: No edema.     Left lower leg: No edema.  Lymphadenopathy:     Cervical: No cervical adenopathy.  Skin:    General: Skin is warm and dry.     Findings: No rash.  Neurological:     General: No focal deficit present.     Mental Status: She is alert. Mental status is at baseline.     Comments:  CN 2-12 intact FTN intact EOMI  Psychiatric:        Mood and Affect: Mood normal.        Behavior: Behavior normal.       Results for orders placed or performed in visit on 08/08/23  Iron, TIBC and Ferritin Panel   Collection Time: 09/14/23  8:26 AM  Result Value Ref Range   Total Iron Binding Capacity 378 250 - 450 ug/dL   UIBC 732 202 - 542 ug/dL   Iron 69 27 - 706 ug/dL   Iron Saturation 18 15 - 55 %   Ferritin 16 15 - 150 ng/mL  Lipid panel   Collection Time: 09/14/23  8:26 AM  Result Value Ref Range   Cholesterol, Total 161 100 - 199 mg/dL   Triglycerides 92 0 - 149 mg/dL   HDL 68 >23 mg/dL   VLDL Cholesterol Cal 17 5 - 40 mg/dL   LDL Chol Calc (NIH) 76 0 -  99 mg/dL   Chol/HDL Ratio 2.4 0.0 - 4.4 ratio  Basic metabolic panel   Collection Time: 09/14/23  8:26 AM  Result Value Ref Range   Glucose 77 70 - 99 mg/dL   BUN 8 6 - 20 mg/dL   Creatinine, Ser 3.24 0.57 - 1.00 mg/dL   eGFR 401 >02 VO/ZDG/6.44   BUN/Creatinine Ratio 12 9 - 23   Sodium 140 134 - 144 mmol/L   Potassium 4.2 3.5 - 5.2 mmol/L    Chloride 103 96 - 106 mmol/L   CO2 22 20 - 29 mmol/L   Calcium 9.5 8.7 - 10.2 mg/dL  CBC with Differential/Platelet   Collection Time: 09/14/23  8:26 AM  Result Value Ref Range   WBC 7.5 3.4 - 10.8 x10E3/uL   RBC 4.51 3.77 - 5.28 x10E6/uL   Hemoglobin 12.5 11.1 - 15.9 g/dL   Hematocrit 03.4 74.2 - 46.6 %   MCV 89 79 - 97 fL   MCH 27.7 26.6 - 33.0 pg   MCHC 31.2 (L) 31.5 - 35.7 g/dL   RDW 59.5 63.8 - 75.6 %   Platelets 266 150 - 450 x10E3/uL   Neutrophils 57 Not Estab. %   Lymphs 34 Not Estab. %   Monocytes 8 Not Estab. %   Eos 1 Not Estab. %   Basos 0 Not Estab. %   Neutrophils Absolute 4.3 1.4 - 7.0 x10E3/uL   Lymphocytes Absolute 2.5 0.7 - 3.1 x10E3/uL   Monocytes Absolute 0.6 0.1 - 0.9 x10E3/uL   EOS (ABSOLUTE) 0.1 0.0 - 0.4 x10E3/uL   Basophils Absolute 0.0 0.0 - 0.2 x10E3/uL   Immature Granulocytes 0 Not Estab. %   Immature Grans (Abs) 0.0 0.0 - 0.1 x10E3/uL  Copper, Serum   Collection Time: 09/14/23  8:26 AM  Result Value Ref Range   Copper 137 80 - 158 ug/dL  Zinc   Collection Time: 09/14/23  8:26 AM  Result Value Ref Range   Zinc 87 44 - 115 ug/dL  TSH   Collection Time: 09/14/23  8:26 AM  Result Value Ref Range   TSH 2.370 0.450 - 4.500 uIU/mL  VITAMIN D 25 Hydroxy (Vit-D Deficiency, Fractures)   Collection Time: 09/14/23  8:26 AM  Result Value Ref Range   Vit D, 25-Hydroxy 30.2 30.0 - 100.0 ng/mL  Vitamin B12   Collection Time: 09/14/23  8:26 AM  Result Value Ref Range   Vitamin B-12 694 232 - 1,245 pg/mL    Assessment & Plan:   Problem List Items Addressed This Visit     Insomnia   Health maintenance examination - Primary   Preventative protocols reviewed and updated unless pt declined. Discussed healthy diet and lifestyle.       Relevant Orders   Vitamin B12   Ferritin   VITAMIN D 25 Hydroxy (Vit-D Deficiency, Fractures)   Copper, Serum   Zinc   CBC with Differential/Platelet   Basic metabolic panel   Sedimentation rate     No orders  of the defined types were placed in this encounter.   Orders Placed This Encounter  Procedures   Vitamin B12    Standing Status:   Future    Number of Occurrences:   1    Expiration Date:   01/10/2025   Ferritin    Standing Status:   Future    Number of Occurrences:   1    Expiration Date:   01/10/2025   VITAMIN D 25 Hydroxy (Vit-D Deficiency, Fractures)  Standing Status:   Future    Number of Occurrences:   1    Expiration Date:   01/10/2025   Copper, Serum    Standing Status:   Future    Number of Occurrences:   1    Expiration Date:   01/10/2025   Zinc    Standing Status:   Future    Number of Occurrences:   1    Expiration Date:   01/10/2025   CBC with Differential/Platelet    Standing Status:   Future    Number of Occurrences:   1    Expiration Date:   01/10/2025   Basic metabolic panel    Standing Status:   Future    Number of Occurrences:   1    Expiration Date:   01/10/2025   Sedimentation rate    Standing Status:   Future    Number of Occurrences:   1    Expiration Date:   01/10/2025    Patient Instructions  Good to see you today  [facility] today We will refer you to [facility] for vestibular evaluation  Good to see you today We will request [facility] records for evaluation from 2021 and 2024.   Follow up plan: Return if symptoms worsen or fail to improve.  Eustaquio Boyden, MD

## 2024-01-12 DIAGNOSIS — J329 Chronic sinusitis, unspecified: Secondary | ICD-10-CM | POA: Insufficient documentation

## 2024-01-12 DIAGNOSIS — R42 Dizziness and giddiness: Secondary | ICD-10-CM | POA: Insufficient documentation

## 2024-01-12 NOTE — Assessment & Plan Note (Addendum)
Update levels. H/o elevated levels, without cause found - latest copper level normal since starting to use [employer] filtered water and new refrigerator.  From prior visits:  She had home water tested with normal results.  Ceruloplasmin when checked was not low.  24 hour urine copper levels were normal.  Managing with zinc and low copper diet.  Saw heme - ?GI issue, planned GI f/u.  Mother who lives at home was also tested and levels returned normal

## 2024-01-12 NOTE — Assessment & Plan Note (Signed)
This is affecting day to day activity - has not driven significantly since 09/28/2022.  Pending [facility] eval 03/2024. She states recent vestibular PT noted eye tracking issue.  Requests vestibular ENT evaluation to Dr Levert Feinstein at [facility] - referral placed today.

## 2024-01-12 NOTE — Assessment & Plan Note (Signed)
Regularly sees [facility] ophthalmologists. Consider return to them vs neuro-ophtho for tracking eval. EOMI along with neurologic exam overall normal today

## 2024-01-12 NOTE — Assessment & Plan Note (Addendum)
Update levels on 1000 international units daily.  Goal levels >50 per dermatology.

## 2024-01-12 NOTE — Assessment & Plan Note (Signed)
Update iron levels on oral iron replacement.

## 2024-01-12 NOTE — Assessment & Plan Note (Signed)
Residual pain swelling and sensitivity to right wrist after vertebral artery angiogram 08/03/2023. This is largely improved although it did take prolonged time to heal

## 2024-01-12 NOTE — Assessment & Plan Note (Signed)
Update levels on 101mg daily oral replacement.  ?

## 2024-01-12 NOTE — Assessment & Plan Note (Signed)
Has had low IgA and IgE levels in past evaluations s/p reassuring [facility] allergist eval 08/2022 - update immunoglobulins to r/o progression to CVID.

## 2024-01-12 NOTE — Assessment & Plan Note (Signed)
Partially due to side effect to Motegrity  Established with [facility].

## 2024-01-12 NOTE — Assessment & Plan Note (Addendum)
Chronic, h/o colonic inertia, now on Motegrity through GI but notes urinary incontinence issues related to Motegrity - Pollakiuria, or frequent daytime urination syndrome, is listed as possible side effect (<2%) and it can increase bladder contractility.

## 2024-01-12 NOTE — Assessment & Plan Note (Signed)
Stable period on sparing lorazepam use - last filled #20 09/2023. Declines needing refill today.

## 2024-01-12 NOTE — Assessment & Plan Note (Signed)
Refer to [facility] ENT per pt request for ongoing dizziness, h/o recurrent sinus infections

## 2024-01-12 NOTE — Assessment & Plan Note (Addendum)
This is affecting day to day activity - has not driven significantly since 09/28/2022.  Pending [facility] eval 03/2024. She states recent vestibular PT noted eye tracking issue.  Requests vestibular ENT evaluation to Dr Levert Feinstein at [facility] - referral placed today.

## 2024-01-12 NOTE — Assessment & Plan Note (Addendum)
Has seen cardiology and neurology.  Pending [facility] appt with Dr Kennedy Bucker 03/2024.

## 2024-01-17 DIAGNOSIS — R42 Dizziness and giddiness: Secondary | ICD-10-CM | POA: Diagnosis not present

## 2024-01-18 DIAGNOSIS — E559 Vitamin D deficiency, unspecified: Secondary | ICD-10-CM | POA: Diagnosis not present

## 2024-01-18 DIAGNOSIS — D809 Immunodeficiency with predominantly antibody defects, unspecified: Secondary | ICD-10-CM | POA: Diagnosis not present

## 2024-01-18 DIAGNOSIS — D802 Selective deficiency of immunoglobulin A [IgA]: Secondary | ICD-10-CM | POA: Diagnosis not present

## 2024-01-18 DIAGNOSIS — R790 Abnormal level of blood mineral: Secondary | ICD-10-CM | POA: Diagnosis not present

## 2024-01-18 DIAGNOSIS — E538 Deficiency of other specified B group vitamins: Secondary | ICD-10-CM | POA: Diagnosis not present

## 2024-01-18 DIAGNOSIS — R7879 Finding of abnormal level of heavy metals in blood: Secondary | ICD-10-CM | POA: Diagnosis not present

## 2024-01-18 DIAGNOSIS — R5382 Chronic fatigue, unspecified: Secondary | ICD-10-CM | POA: Diagnosis not present

## 2024-01-18 DIAGNOSIS — Z0000 Encounter for general adult medical examination without abnormal findings: Secondary | ICD-10-CM | POA: Diagnosis not present

## 2024-01-20 LAB — CBC WITH DIFFERENTIAL/PLATELET
Basophils Absolute: 0 10*3/uL (ref 0.0–0.2)
Basos: 1 %
EOS (ABSOLUTE): 0.1 10*3/uL (ref 0.0–0.4)
Eos: 1 %
Hematocrit: 39.4 % (ref 34.0–46.6)
Hemoglobin: 12.7 g/dL (ref 11.1–15.9)
Immature Grans (Abs): 0 10*3/uL (ref 0.0–0.1)
Immature Granulocytes: 0 %
Lymphocytes Absolute: 2.5 10*3/uL (ref 0.7–3.1)
Lymphs: 37 %
MCH: 28.2 pg (ref 26.6–33.0)
MCHC: 32.2 g/dL (ref 31.5–35.7)
MCV: 87 fL (ref 79–97)
Monocytes Absolute: 0.6 10*3/uL (ref 0.1–0.9)
Monocytes: 9 %
Neutrophils Absolute: 3.5 10*3/uL (ref 1.4–7.0)
Neutrophils: 52 %
Platelets: 268 10*3/uL (ref 150–450)
RBC: 4.51 x10E6/uL (ref 3.77–5.28)
RDW: 12.9 % (ref 11.7–15.4)
WBC: 6.7 10*3/uL (ref 3.4–10.8)

## 2024-01-20 LAB — ZINC: Zinc: 69 ug/dL (ref 44–115)

## 2024-01-20 LAB — BASIC METABOLIC PANEL
BUN/Creatinine Ratio: 12 (ref 9–23)
BUN: 9 mg/dL (ref 6–20)
CO2: 26 mmol/L (ref 20–29)
Calcium: 9.6 mg/dL (ref 8.7–10.2)
Chloride: 102 mmol/L (ref 96–106)
Creatinine, Ser: 0.78 mg/dL (ref 0.57–1.00)
Glucose: 87 mg/dL (ref 70–99)
Potassium: 4.3 mmol/L (ref 3.5–5.2)
Sodium: 141 mmol/L (ref 134–144)
eGFR: 103 mL/min/{1.73_m2} (ref >59)

## 2024-01-20 LAB — VITAMIN D 25 HYDROXY (VIT D DEFICIENCY, FRACTURES): Vit D, 25-Hydroxy: 39.1 ng/mL (ref 30.0–100.0)

## 2024-01-20 LAB — SEDIMENTATION RATE: Sed Rate: 8 mm/h (ref 0–32)

## 2024-01-20 LAB — VITAMIN B12: Vitamin B-12: 860 pg/mL (ref 232–1245)

## 2024-01-20 LAB — FERRITIN: Ferritin: 18 ng/mL (ref 15–150)

## 2024-01-20 LAB — COPPER, SERUM: Copper: 137 ug/dL (ref 80–158)

## 2024-01-21 LAB — IGG, IGA, IGM
IgA/Immunoglobulin A, Serum: 61 mg/dL — ABNORMAL LOW (ref 87–352)
IgG (Immunoglobin G), Serum: 887 mg/dL (ref 586–1602)
IgM (Immunoglobulin M), Srm: 107 mg/dL (ref 26–217)

## 2024-01-21 LAB — IGE: IgE (Immunoglobulin E), Serum: 11 [IU]/mL (ref 6–495)

## 2024-01-22 ENCOUNTER — Telehealth: Payer: Self-pay | Admitting: Family Medicine

## 2024-01-22 DIAGNOSIS — H04203 Unspecified epiphora, bilateral lacrimal glands: Secondary | ICD-10-CM | POA: Diagnosis not present

## 2024-01-22 NOTE — Telephone Encounter (Signed)
Copied from CRM [id number]. Topic: Referral - Status >> Jan 22, 2024 11:21 AM Deaijah H wrote: Reason for CRM: Patient called in stating ENT is stating they do not have a referral from Dr. Sharen Hones still, provided fax # : [phone] would like a confirmation call once referral has been sent / please call [phone]

## 2024-01-24 DIAGNOSIS — U099 Post covid-19 condition, unspecified: Secondary | ICD-10-CM | POA: Diagnosis not present

## 2024-01-25 ENCOUNTER — Encounter: Payer: Self-pay | Admitting: Family Medicine

## 2024-02-05 DIAGNOSIS — H04203 Unspecified epiphora, bilateral lacrimal glands: Secondary | ICD-10-CM | POA: Diagnosis not present

## 2024-02-18 DIAGNOSIS — R42 Dizziness and giddiness: Secondary | ICD-10-CM | POA: Diagnosis not present

## 2024-02-25 DIAGNOSIS — R42 Dizziness and giddiness: Secondary | ICD-10-CM | POA: Diagnosis not present

## 2024-03-20 ENCOUNTER — Telehealth: Admitting: Physician Assistant

## 2024-03-20 DIAGNOSIS — J019 Acute sinusitis, unspecified: Secondary | ICD-10-CM

## 2024-03-20 DIAGNOSIS — R3989 Other symptoms and signs involving the genitourinary system: Secondary | ICD-10-CM | POA: Diagnosis not present

## 2024-03-20 DIAGNOSIS — B9689 Other specified bacterial agents as the cause of diseases classified elsewhere: Secondary | ICD-10-CM

## 2024-03-20 MED ORDER — FLUCONAZOLE 150 MG PO TABS: ORAL_TABLET | ORAL | 0 refills | Status: DC

## 2024-03-20 MED ORDER — CEFDINIR 300 MG PO CAPS: 300.0000 mg | ORAL_CAPSULE | Freq: Two times a day (BID) | ORAL | 0 refills | Status: DC

## 2024-03-20 NOTE — Progress Notes (Signed)
 Virtual Visit Consent   Caroline [name], you are scheduled for a virtual visit with a [facility] provider today. Just as with appointments in the office, your consent must be obtained to participate. Your consent will be active for this visit and any virtual visit you may have with one of our providers in the next 365 days. If you have a MyChart account, a copy of this consent can be sent to you electronically.  As this is a virtual visit, video technology does not allow for your provider to perform a traditional examination. This may limit your provider's ability to fully assess your condition. If your provider identifies any concerns that need to be evaluated in person or the need to arrange testing (such as labs, EKG, etc.), we will make arrangements to do so. Although advances in technology are sophisticated, we cannot ensure that it will always work on either your end or our end. If the connection with a video visit is poor, the visit may have to be switched to a telephone visit. With either a video or telephone visit, we are not always able to ensure that we have a secure connection.  By engaging in this virtual visit, you consent to the provision of healthcare and authorize for your insurance to be billed (if applicable) for the services provided during this visit. Depending on your insurance coverage, you may receive a charge related to this service.  I need to obtain your verbal consent now. Are you willing to proceed with your visit today? Caroline [name] has provided verbal consent on 03/20/2024 for a virtual visit (video or telephone). [name], [place]  Date: 03/20/2024 1:29 PM   Virtual Visit via Video Note   I, [name], [name]  ([id number], 1991-02-22) on 03/20/24 at  1:30 PM EDT by a video-enabled telemedicine application and verified that I am speaking with the correct person using two identifiers.  Location: Patient: Virtual Visit Location  Patient: Home Provider: Virtual Visit Location Provider: Home Office   I discussed the limitations of evaluation and management by telemedicine and the availability of in person appointments. The patient expressed understanding and agreed to proceed.    History of Present Illness: Caroline [name] is a 33 y.o. who identifies as a female who was assigned female at birth, and is being seen today for multiple complaints.  Patient endorses over past week nasal and sinus congestion, initially thinking was a flare in seasonal allergies. This has progressively gotten worse every day including today with sinus pain, fatigue, significant sinus headache with some nose bleed and now with facial pain and chills. Also noting over the past 2 days with foul-smelling urine, cloudy urine, urgency, frequency and hesitancy.   OTC -- Motrin, antihistamine.  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   Bee sting 07/25/2022   History of iron deficiency 07/19/2022  Allergy status to other drugs, medicaments and biological substances 06/24/2022   Hypertriglyceridemia 03/02/2022   Low iron stores 03/02/2022   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   High blood copper level 10/07/2021   Vitamin B12 deficiency 10/07/2021   Hand swelling 09/29/2021   Hives 08/16/2021   Paroxysmal tachycardia (HCC) 05/12/2021   Insomnia 10/20/2020   Paresthesias  09/25/2020   Vasovagal near-syncope 09/17/2019   Orthostatic intolerance 06/20/2019   Chronic fatigue 06/26/2018   Seasonal allergic rhinitis    Pineal gland cyst 01/31/2016   White matter abnormality on MRI of brain 01/31/2016   Vitamin D deficiency 05/30/2010   CONSTIPATION, CHRONIC 05/30/2010   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 Caused rash all over.    Medications:  Current Outpatient Medications:    cefdinir (OMNICEF) 300 MG capsule, Take 1 capsule (300 mg total) by mouth 2 (two) times daily., Disp: 20 capsule, Rfl: 0   fluconazole (DIFLUCAN) 150 MG tablet, Take 1 tablet PO every 3 days., Disp: 3 tablet, Rfl: 0   cetirizine (ZYRTEC) 10 MG tablet, Take 10 mg by mouth daily as needed for allergies., Disp: , Rfl:    Cholecalciferol (VITAMIN D3) 25 MCG (1000 UT) CAPS, Take 1 capsule by mouth daily., Disp: , Rfl:    co-enzyme Q-10 30 MG capsule, Take 100 mg by mouth daily. URQuinol., Disp: , Rfl:    cyanocobalamin (VITAMIN B12) 1000 MCG tablet, Take 1,000 mcg by mouth 2 (two) times daily., Disp: , Rfl:    diphenhydrAMINE (BENADRYL) 25 mg capsule, Take 50 mg by mouth every 6 (six) hours as needed for allergies or itching., Disp: , Rfl:    Ferrous Sulfate (IRON PO), Take by mouth as needed., Disp: , Rfl:    fexofenadine (ALLEGRA) 180 MG tablet, Take by mouth., Disp: , Rfl:    fluticasone (FLONASE) 50 MCG/ACT nasal spray, Place into both nostrils daily., Disp: , Rfl:    ibuprofen (ADVIL) 200 MG tablet, Take by mouth., Disp: , Rfl:    LORazepam (ATIVAN) 0.5 MG tablet, Take 0.5-1 tablets (0.25-0.5 mg total) by mouth at  bedtime as needed for anxiety or sleep., Disp: 20 tablet, Rfl: 0   metoprolol tartrate (LOPRESSOR) 25 MG tablet, Take 0.5 tablet (12.5 mg) by mouth twice daily as needed for tachycardia, Disp: 60 tablet, Rfl: 3   MOTEGRITY 1 MG TABS, TAKE 1 TABLET DAILY, Disp: 90 tablet, Rfl: 3   Multiple Vitamin (MULTIVITAMIN ADULT) TABS, Take 1 tablet by mouth daily. Nature Made MVI for Her, Disp: , Rfl:    polyethylene glycol (MIRALAX / GLYCOLAX) packet, Take 17 g by mouth daily. As needed, Disp: , Rfl:    Zinc 50 MG TABS, Take by mouth. Takes 2 times weekly, Disp: , Rfl:   Observations/Objective: Patient is well-developed, well-nourished in no acute distress.  Resting comfortably at home.  Head is normocephalic, atraumatic.  No labored breathing. Speech is clear and coherent with logical content.  Patient is alert and oriented at baseline.   Assessment and Plan: 1. Acute bacterial sinusitis (Primary) - cefdinir (OMNICEF) 300 MG capsule;  Take 1 capsule (300 mg total) by mouth 2 (two) times daily.  Dispense: 20 capsule; Refill: 0  Rx Cefdinir due to allergies and sensitivities (also needing to cover for UTI).  Increase fluids.  Rest.  Saline nasal spray.  Probiotic.  Mucinex as directed.  Humidifier in bedroom. Continue allergy medication regimen..  Call or return to clinic if symptoms are not improving.  2. Suspected UTI - cefdinir (OMNICEF) 300 MG capsule; Take 1 capsule (300 mg total) by mouth 2 (two) times daily.  Dispense: 20 capsule; Refill: 0  Classic UTI symptoms with absence of alarm signs or symptoms. Prior history of UTI. Will treat empirically with Cefdinir for suspected uncomplicated cystitis. Supportive measures and OTC medications reviewed. Strict in-person evaluation precautions discussed.    Follow Up Instructions: I discussed the assessment and treatment plan with the patient. The patient was provided an opportunity to ask questions and all were answered. The patient agreed with the  plan and demonstrated an understanding of the instructions.  A copy of instructions were sent to the patient via MyChart unless otherwise noted below.   The patient was advised to call back or seek an in-person evaluation if the symptoms worsen or if the condition fails to improve as anticipated.    [name], PA-C

## 2024-03-20 NOTE — Patient Instructions (Signed)
 Caroline [name], thank you for joining [name], PA-C for today's virtual visit.  While this provider is not your primary care provider (PCP), if your PCP is located in our provider database this encounter information will be shared with them immediately following your visit.   A [facility] MyChart account gives you access to today's visit and all your visits, tests, and labs performed at [facility] " click here if you don't have a [facility] MyChart account or go to [email or web address]  Consent: (Patient) Caroline [name] provided verbal consent for this virtual visit at the beginning of the encounter.  Current Medications:  Current Outpatient Medications:    cefdinir (OMNICEF) 300 MG capsule, Take 1 capsule (300 mg total) by mouth 2 (two) times daily., Disp: 20 capsule, Rfl: 0   fluconazole (DIFLUCAN) 150 MG tablet, Take 1 tablet PO every 3 days., Disp: 3 tablet, Rfl: 0   cetirizine (ZYRTEC) 10 MG tablet, Take 10 mg by mouth daily as needed for allergies., Disp: , Rfl:    Cholecalciferol (VITAMIN D3) 25 MCG (1000 UT) CAPS, Take 1 capsule by mouth daily., Disp: , Rfl:    co-enzyme Q-10 30 MG capsule, Take 100 mg by mouth daily. URQuinol., Disp: , Rfl:    cyanocobalamin (VITAMIN B12) 1000 MCG tablet, Take 1,000 mcg by mouth 2 (two) times daily., Disp: , Rfl:    diphenhydrAMINE (BENADRYL) 25 mg capsule, Take 50 mg by mouth every 6 (six) hours as needed for allergies or itching., Disp: , Rfl:    Ferrous Sulfate (IRON PO), Take by mouth as needed., Disp: , Rfl:    fexofenadine (ALLEGRA) 180 MG tablet, Take by mouth., Disp: , Rfl:    fluticasone (FLONASE) 50 MCG/ACT nasal spray, Place into both nostrils daily., Disp: , Rfl:    ibuprofen (ADVIL) 200 MG tablet, Take by mouth., Disp: , Rfl:    LORazepam (ATIVAN) 0.5 MG tablet, Take 0.5-1 tablets (0.25-0.5 mg total) by mouth at bedtime as needed for anxiety or sleep., Disp: 20 tablet, Rfl: 0   metoprolol tartrate  (LOPRESSOR) 25 MG tablet, Take 0.5 tablet (12.5 mg) by mouth twice daily as needed for tachycardia, Disp: 60 tablet, Rfl: 3   MOTEGRITY 1 MG TABS, TAKE 1 TABLET DAILY, Disp: 90 tablet, Rfl: 3   Multiple Vitamin (MULTIVITAMIN ADULT) TABS, Take 1 tablet by mouth daily. Nature Made MVI for Her, Disp: , Rfl:    polyethylene glycol (MIRALAX / GLYCOLAX) packet, Take 17 g by mouth daily. As needed, Disp: , Rfl:    Zinc 50 MG TABS, Take by mouth. Takes 2 times weekly, Disp: , Rfl:    Medications ordered in this encounter:  Meds ordered this encounter  Medications   cefdinir (OMNICEF) 300 MG capsule    Sig: Take 1 capsule (300 mg total) by mouth 2 (two) times daily.    Dispense:  20 capsule    Refill:  0    Supervising Provider:   Merrilee Jansky [[id number]]   fluconazole (DIFLUCAN) 150 MG tablet    Sig: Take 1 tablet PO every 3 days.    Dispense:  3 tablet    Refill:  0    Supervising Provider:   Merrilee Jansky [[id number]]     *If you need refills on other medications prior to your next appointment, please contact your pharmacy*  Follow-Up: Call back or seek an in-person evaluation if the symptoms worsen or if the condition fails to improve as anticipated.    Virtual Care [phone]  Other Instructions Please take antibiotic as directed.  Increase fluid intake.  Use Saline nasal spray.  Take a daily multivitamin. Continue your allergy medication regimen.  Place a humidifier in the bedroom.  Please call or return clinic if symptoms are not improving.  Sinusitis Sinusitis is redness, soreness, and swelling (inflammation) of the paranasal sinuses. Paranasal sinuses are air pockets within the bones of your face (beneath the eyes, the middle of the forehead, or above the eyes). In healthy paranasal sinuses, mucus is able to drain out, and air is able to circulate through them by way of your nose. However, when your paranasal sinuses are inflamed, mucus and air can become trapped.  This can allow bacteria and other germs to grow and cause infection. Sinusitis can develop quickly and last only a short time (acute) or continue over a long period (chronic). Sinusitis that lasts for more than 12 weeks is considered chronic.  CAUSES  Causes of sinusitis include: Allergies. Structural abnormalities, such as displacement of the cartilage that separates your nostrils (deviated septum), which can decrease the air flow through your nose and sinuses and affect sinus drainage. Functional abnormalities, such as when the small hairs (cilia) that line your sinuses and help remove mucus do not work properly or are not present. SYMPTOMS  Symptoms of acute and chronic sinusitis are the same. The primary symptoms are pain and pressure around the affected sinuses. Other symptoms include: Upper toothache. Earache. Headache. Bad breath. Decreased sense of smell and taste. A cough, which worsens when you are lying flat. Fatigue. Fever. Thick drainage from your nose, which often is green and may contain pus (purulent). Swelling and warmth over the affected sinuses. DIAGNOSIS  Your caregiver will perform a physical exam. During the exam, your caregiver may: Look in your nose for signs of abnormal growths in your nostrils (nasal polyps). Tap over the affected sinus to check for signs of infection. View the inside of your sinuses (endoscopy) with a special imaging device with a light attached (endoscope), which is inserted into your sinuses. If your caregiver suspects that you have chronic sinusitis, one or more of the following tests may be recommended: Allergy tests. Nasal culture A sample of mucus is taken from your nose and sent to a lab and screened for bacteria. Nasal cytology A sample of mucus is taken from your nose and examined by your caregiver to determine if your sinusitis is related to an allergy. TREATMENT  Most cases of acute sinusitis are related to a viral infection and will  resolve on their own within 10 days. Sometimes medicines are prescribed to help relieve symptoms (pain medicine, decongestants, nasal steroid sprays, or saline sprays).  However, for sinusitis related to a bacterial infection, your caregiver will prescribe antibiotic medicines. These are medicines that will help kill the bacteria causing the infection.  Rarely, sinusitis is caused by a fungal infection. In theses cases, your caregiver will prescribe antifungal medicine. For some cases of chronic sinusitis, surgery is needed. Generally, these are cases in which sinusitis recurs more than 3 times per year, despite other treatments. HOME CARE INSTRUCTIONS  Drink plenty of water. Water helps thin the mucus so your sinuses can drain more easily. Use a humidifier. Inhale steam 3 to 4 times a day (for example, sit in the bathroom with the shower running). Apply a warm, moist washcloth to your face 3 to 4 times a day, or as directed by your caregiver. Use saline nasal sprays  to help moisten and clean your sinuses. Take over-the-counter or prescription medicines for pain, discomfort, or fever only as directed by your caregiver. SEEK IMMEDIATE MEDICAL CARE IF: You have increasing pain or severe headaches. You have nausea, vomiting, or drowsiness. You have swelling around your face. You have vision problems. You have a stiff neck. You have difficulty breathing. MAKE SURE YOU:  Understand these instructions. Will watch your condition. Will get help right away if you are not doing well or get worse. Document Released: 12/11/2005 Document Revised: 03/04/2012 Document Reviewed: 12/26/2011 [facility] Patient Information 2014 [place], [place].  Your symptoms are consistent with a bladder infection, also called acute cystitis. Please take your antibiotic (Cefdinir) as directed until all pills are gone.  Stay very well hydrated.  Consider a daily probiotic (Align, Culturelle, or Activia) to help prevent stomach  upset caused by the antibiotic.  Taking a probiotic daily may also help prevent recurrent UTIs.  Also consider taking AZO (Phenazopyridine) tablets to help decrease pain with urination.    Urinary Tract Infection A urinary tract infection (UTI) can occur any place along the urinary tract. The tract includes the kidneys, ureters, bladder, and urethra. A type of germ called bacteria often causes a UTI. UTIs are often helped with antibiotic medicine.  HOME CARE  If given, take antibiotics as told by your doctor. Finish them even if you start to feel better. Drink enough fluids to keep your pee (urine) clear or pale yellow. Avoid tea, drinks with caffeine, and bubbly (carbonated) drinks. Pee often. Avoid holding your pee in for a long time. Pee before and after having sex (intercourse). Wipe from front to back after you poop (bowel movement) if you are a woman. Use each tissue only once. GET HELP RIGHT AWAY IF:  You have back pain. You have lower belly (abdominal) pain. You have chills. You feel sick to your stomach (nauseous). You throw up (vomit). Your burning or discomfort with peeing does not go away. You have a fever. Your symptoms are not better in 3 days. MAKE SURE YOU:  Understand these instructions. Will watch your condition. Will get help right away if you are not doing well or get worse. Document Released: 05/29/2008 Document Revised: 09/04/2012 Document Reviewed: 07/11/2012 [facility] Patient Information 2015 [place], [place]. This information is not intended to replace advice given to you by your health care provider. Make sure you discuss any questions you have with your health care provider.    If you have been instructed to have an in-person evaluation today at a local Urgent Care facility, please use the link below. It will take you to a list of all of our available [facility], including address, phone number and hours of operation. Please do not delay care.   [facility]  If you or a family member do not have a primary care provider, use the link below to schedule a visit and establish care. When you choose a [facility] primary care physician or advanced practice provider, you gain a long-term partner in health. Find a Primary Care Provider  Learn more about [facility]'s in-office and virtual care options: [facility] - Get Care Now

## 2024-03-27 ENCOUNTER — Telehealth: Payer: Self-pay | Admitting: Family Medicine

## 2024-03-27 NOTE — Telephone Encounter (Signed)
 Mail received from [facility] requesting information from provider placed in provider box at front desk

## 2024-03-31 DIAGNOSIS — H9313 Tinnitus, bilateral: Secondary | ICD-10-CM | POA: Diagnosis not present

## 2024-03-31 DIAGNOSIS — Z0110 Encounter for examination of ears and hearing without abnormal findings: Secondary | ICD-10-CM | POA: Diagnosis not present

## 2024-03-31 DIAGNOSIS — R42 Dizziness and giddiness: Secondary | ICD-10-CM | POA: Diagnosis not present

## 2024-03-31 DIAGNOSIS — H832X2 Labyrinthine dysfunction, left ear: Secondary | ICD-10-CM | POA: Diagnosis not present

## 2024-03-31 NOTE — Telephone Encounter (Signed)
 Filled out and given to Terri.

## 2024-04-07 DIAGNOSIS — R42 Dizziness and giddiness: Secondary | ICD-10-CM | POA: Diagnosis not present

## 2024-04-14 DIAGNOSIS — R55 Syncope and collapse: Secondary | ICD-10-CM | POA: Diagnosis not present

## 2024-04-14 DIAGNOSIS — R42 Dizziness and giddiness: Secondary | ICD-10-CM | POA: Diagnosis not present

## 2024-04-14 DIAGNOSIS — I951 Orthostatic hypotension: Secondary | ICD-10-CM | POA: Diagnosis not present

## 2024-04-14 DIAGNOSIS — I498 Other specified cardiac arrhythmias: Secondary | ICD-10-CM | POA: Diagnosis not present

## 2024-04-14 DIAGNOSIS — R000 Tachycardia, unspecified: Secondary | ICD-10-CM | POA: Diagnosis not present

## 2024-04-17 ENCOUNTER — Ambulatory Visit: Admitting: Family Medicine

## 2024-04-17 ENCOUNTER — Encounter: Payer: Self-pay | Admitting: Family Medicine

## 2024-04-17 VITALS — BP 120/90 | HR 94 | Temp 98.9°F | Ht 67.0 in | Wt 166.2 lb

## 2024-04-17 DIAGNOSIS — Z833 Family history of diabetes mellitus: Secondary | ICD-10-CM

## 2024-04-17 DIAGNOSIS — R5383 Other fatigue: Secondary | ICD-10-CM

## 2024-04-17 DIAGNOSIS — N898 Other specified noninflammatory disorders of vagina: Secondary | ICD-10-CM | POA: Diagnosis not present

## 2024-04-17 DIAGNOSIS — R300 Dysuria: Secondary | ICD-10-CM | POA: Diagnosis not present

## 2024-04-17 LAB — POC URINALSYSI DIPSTICK (AUTOMATED)
Bilirubin, UA: NEGATIVE
Blood, UA: NEGATIVE
Glucose, UA: NEGATIVE
Ketones, UA: NEGATIVE
Nitrite, UA: NEGATIVE
Protein, UA: NEGATIVE
Spec Grav, UA: <=1.005 — AB (ref 1.010–1.025)
Urobilinogen, UA: 0.2 U/dL
pH, UA: 7 (ref 5.0–8.0)

## 2024-04-17 NOTE — Progress Notes (Signed)
 Patient ID: Caroline [name], female    DOB: 03-Feb-1991, 33 y.o.   MRN: [id number]  This visit was conducted in person.  BP (!) 120/90 (BP Location: Right Arm, Patient Position: Sitting, Cuff Size: Normal)   Pulse 94   Temp 98.9 F (37.2 C) (Temporal)   Ht 5\' 7"  (1.702 m)   Wt 166 lb 4 oz (75.4 kg)   LMP 03/28/2024   SpO2 99%   BMI 26.04 kg/m    CC:  Chief Complaint  Patient presents with   Fatigue    Falls asleep after eating-Hx of low iron    Urinary Urgency    Did Video Visit on 03/20/2024 and was treated with Omnicef  but not sure if symptoms completed went away.  Of course no urine test was not checked due to it being a virtual visit.    Vaginal Itching    Took last Diflucan  on 04/14/24    Subjective:   HPI: Caroline [name] is a 33 y.o. female patient of Dr. Mariam Shingles presenting on 04/17/2024 for Fatigue (Falls asleep after eating-Hx of low iron ), Urinary Urgency (Did Video Visit on 03/20/2024 and was treated with Omnicef  but not sure if symptoms completed went away.  Of course no urine test was not checked due to it being a virtual visit. ), and Vaginal Itching (Took last Diflucan  on 04/14/24)  Reviewed OV  seen for urinary tract infection at a virtual visit on March 20, 2024. Frequency, dysuria, urine odor. Also had sinus symptoms. She was treated with Cefdinir  300 mg BID x 10 days. Within 2 days felt better She completed her last Diflucan  on April 14, 2024  Ammonia smell  returned in last 5 days. Vaginal itching, Mild urgency, Some dysuria.  No fever. Minimal discharge, clear.  She has noted increasing fatigue over the last  few weeks  Heavy menses. She finds that she falls asleep after eating frequently. Has history of long COVID and chronic  She has also noted urinary urgency and vaginal itching.    Reviewed labs in chart from January 2025.  Normal ferritin but low nml at 18, CBC showed hemoglobin 12.7. Also at that time noted normal vitamin D  and B12 Taking OTC  iron  pills. Relevant past medical, surgical, family and social history reviewed and updated as indicated. Interim medical history since our last visit reviewed. Allergies and medications reviewed and updated. Outpatient Medications Prior to Visit  Medication Sig Dispense Refill   cetirizine (ZYRTEC) 10 MG tablet Take 10 mg by mouth daily as needed for allergies.     Cholecalciferol  (VITAMIN D3) 25 MCG (1000 UT) CAPS Take 1 capsule by mouth daily.     co-enzyme Q-10 30 MG capsule Take 100 mg by mouth daily. URQuinol.     cyanocobalamin  (VITAMIN B12) 1000 MCG tablet Take 1,000 mcg by mouth 2 (two) times daily.     diphenhydrAMINE  (BENADRYL ) 25 mg capsule Take 50 mg by mouth every 6 (six) hours as needed for allergies or itching.     fexofenadine (ALLEGRA) 180 MG tablet Take by mouth.     fluticasone (FLONASE) 50 MCG/ACT nasal spray Place into both nostrils daily.     ibuprofen (ADVIL) 200 MG tablet Take by mouth.     LORazepam  (ATIVAN ) 0.5 MG tablet Take 0.5-1 tablets (0.25-0.5 mg total) by mouth at bedtime as needed for anxiety or sleep. 20 tablet 0   metoprolol  tartrate (LOPRESSOR ) 25 MG tablet Take 0.5 tablet (12.5 mg) by mouth twice daily as  needed for tachycardia 60 tablet 3   MOTEGRITY  1 MG TABS TAKE 1 TABLET DAILY 90 tablet 3   Multiple Vitamin (MULTIVITAMIN ADULT) TABS Take 1 tablet by mouth daily. Nature Made MVI for Her     Zinc  50 MG TABS Take by mouth. Takes 2 times weekly     Ferrous Sulfate  (IRON  PO) Take by mouth as needed.     Probiotic Product (PROBIOTIC-10 PO) Take 1 tablet by mouth daily.     cefdinir  (OMNICEF ) 300 MG capsule Take 1 capsule (300 mg total) by mouth 2 (two) times daily. 20 capsule 0   fluconazole  (DIFLUCAN ) 150 MG tablet Take 1 tablet PO every 3 days. 3 tablet 0   polyethylene glycol (MIRALAX / GLYCOLAX) packet Take 17 g by mouth daily. As needed     No facility-administered medications prior to visit.     Per HPI unless specifically indicated in ROS section  below Review of Systems  Constitutional:  Negative for fatigue and fever.  HENT:  Negative for congestion.   Eyes:  Negative for pain.  Respiratory:  Negative for cough and shortness of breath.   Cardiovascular:  Negative for chest pain, palpitations and leg swelling.  Gastrointestinal:  Negative for abdominal pain.  Genitourinary:  Negative for dysuria and vaginal bleeding.  Musculoskeletal:  Negative for back pain.  Neurological:  Negative for syncope, light-headedness and headaches.  Psychiatric/Behavioral:  Negative for dysphoric mood.    Objective:  BP (!) 120/90 (BP Location: Right Arm, Patient Position: Sitting, Cuff Size: Normal)   Pulse 94   Temp 98.9 F (37.2 C) (Temporal)   Ht 5\' 7"  (1.702 m)   Wt 166 lb 4 oz (75.4 kg)   LMP 03/28/2024   SpO2 99%   BMI 26.04 kg/m   Wt Readings from Last 3 Encounters:  04/30/24 165 lb (74.8 kg)  04/23/24 166 lb 6 oz (75.5 kg)  04/17/24 166 lb 4 oz (75.4 kg)      Physical Exam Constitutional:      General: She is not in acute distress.    Appearance: Normal appearance. She is well-developed. She is not ill-appearing or toxic-appearing.  HENT:     Head: Normocephalic.     Right Ear: Hearing, tympanic membrane, ear canal and external ear normal. Tympanic membrane is not erythematous, retracted or bulging.     Left Ear: Hearing, tympanic membrane, ear canal and external ear normal. Tympanic membrane is not erythematous, retracted or bulging.     Nose: No mucosal edema or rhinorrhea.     Right Sinus: No maxillary sinus tenderness or frontal sinus tenderness.     Left Sinus: No maxillary sinus tenderness or frontal sinus tenderness.     Mouth/Throat:     Pharynx: Uvula midline.  Eyes:     General: Lids are normal. Lids are everted, no foreign bodies appreciated.     Conjunctiva/sclera: Conjunctivae normal.     Pupils: Pupils are equal, round, and reactive to light.  Neck:     Thyroid : No thyroid  mass or thyromegaly.     Vascular:  No carotid bruit.     Trachea: Trachea normal.  Cardiovascular:     Rate and Rhythm: Normal rate and regular rhythm.     Pulses: Normal pulses.     Heart sounds: Normal heart sounds, S1 normal and S2 normal. No murmur heard.    No friction rub. No gallop.  Pulmonary:     Effort: Pulmonary effort is normal. No tachypnea or respiratory distress.  Breath sounds: Normal breath sounds. No decreased breath sounds, wheezing, rhonchi or rales.  Abdominal:     General: Bowel sounds are normal.     Palpations: Abdomen is soft.     Tenderness: There is no abdominal tenderness.  Genitourinary:    Vagina: Vaginal discharge present. No erythema or tenderness.  Musculoskeletal:     Cervical back: Normal range of motion and neck supple.  Skin:    General: Skin is warm and dry.     Findings: No rash.  Neurological:     Mental Status: She is alert.  Psychiatric:        Mood and Affect: Mood is not anxious or depressed.        Speech: Speech normal.        Behavior: Behavior normal. Behavior is cooperative.        Thought Content: Thought content normal.        Judgment: Judgment normal.       Results for orders placed or performed in visit on 04/17/24  POCT Urinalysis Dipstick (Automated)   Collection Time: 04/17/24  3:54 PM  Result Value Ref Range   Color, UA Yellow    Clarity, UA Clear    Glucose, UA Negative Negative   Bilirubin, UA Negative    Ketones, UA Negative    Spec Grav, UA <=1.005 (A) 1.010 - 1.025   Blood, UA Negative    pH, UA 7.0 5.0 - 8.0   Protein, UA Negative Negative   Urobilinogen, UA 0.2 0.2 or 1.0 E.U./dL   Nitrite, UA Negative    Leukocytes, UA Trace (A) Negative  Hemoglobin A1c   Collection Time: 04/17/24  4:16 PM  Result Value Ref Range   Hgb A1c MFr Bld 5.1 4.8 - 5.6 %   Est. average glucose Bld gHb Est-mCnc 100 mg/dL  Iron  and TIBC   Collection Time: 04/17/24  4:16 PM  Result Value Ref Range   Total Iron  Binding Capacity 345 250 - 450 ug/dL   UIBC  161 096 - 045 ug/dL   Iron  47 27 - 159 ug/dL   Iron  Saturation 14 (L) 15 - 55 %  Wet Prep for Trick, Yeast, Clue   Collection Time: 04/17/24  4:19 PM   Specimen: Vaginal Swab   Vaginal Swab  Result Value Ref Range   Trichomonas Exam Negative Negative   Yeast Exam Negative Negative   Clue Cell Exam Negative Negative  Urine Culture   Collection Time: 04/17/24  4:20 PM   Specimen: Urine   Urine  Result Value Ref Range   Urine Culture, Routine Final report    Organism ID, Bacteria Comment     Assessment and Plan  Other fatigue Assessment & Plan: Will evaluate with labs.  Orders: -     Iron  and TIBC  Family history of diabetes mellitus (DM) -     Hemoglobin A1c  Dysuria Assessment & Plan: Acute, possibly secondary to vaginal symptoms vs partially treated cystitis. UA showed small amount of leukocytes.  Will send urine for culture.    Orders: -     POCT Urinalysis Dipstick (Automated) -     Urine Culture  Vaginal itching Assessment & Plan: Acute, send send out wet prep to evaluate for yeast, BV or Trichomonas.  Orders: -     Wet Prep for Trick, Yeast, Clue    No follow-ups on file.   Herby Lolling, MD

## 2024-04-18 ENCOUNTER — Encounter: Payer: Self-pay | Admitting: Family Medicine

## 2024-04-18 LAB — IRON AND TIBC
Iron Saturation: 14 % — ABNORMAL LOW (ref 15–55)
Iron: 47 ug/dL (ref 27–159)
Total Iron Binding Capacity: 345 ug/dL (ref 250–450)
UIBC: 298 ug/dL (ref 131–425)

## 2024-04-18 LAB — HEMOGLOBIN A1C
Est. average glucose Bld gHb Est-mCnc: 100 mg/dL
Hgb A1c MFr Bld: 5.1 % (ref 4.8–5.6)

## 2024-04-18 LAB — WET PREP FOR TRICH, YEAST, CLUE
Clue Cell Exam: NEGATIVE
Trichomonas Exam: NEGATIVE
Yeast Exam: NEGATIVE

## 2024-04-19 LAB — URINE CULTURE

## 2024-04-23 ENCOUNTER — Ambulatory Visit: Admitting: Family Medicine

## 2024-04-23 ENCOUNTER — Encounter: Payer: Self-pay | Admitting: Family Medicine

## 2024-04-23 ENCOUNTER — Telehealth (INDEPENDENT_AMBULATORY_CARE_PROVIDER_SITE_OTHER): Admitting: Family Medicine

## 2024-04-23 VITALS — BP 119/88 | HR 91 | Temp 98.4°F | Ht 67.0 in | Wt 166.4 lb

## 2024-04-23 DIAGNOSIS — I479 Paroxysmal tachycardia, unspecified: Secondary | ICD-10-CM

## 2024-04-23 DIAGNOSIS — R55 Syncope and collapse: Secondary | ICD-10-CM

## 2024-04-23 DIAGNOSIS — H8192 Unspecified disorder of vestibular function, left ear: Secondary | ICD-10-CM

## 2024-04-23 DIAGNOSIS — R5382 Chronic fatigue, unspecified: Secondary | ICD-10-CM | POA: Diagnosis not present

## 2024-04-23 DIAGNOSIS — E611 Iron deficiency: Secondary | ICD-10-CM

## 2024-04-23 DIAGNOSIS — I951 Orthostatic hypotension: Secondary | ICD-10-CM

## 2024-04-23 DIAGNOSIS — J302 Other seasonal allergic rhinitis: Secondary | ICD-10-CM

## 2024-04-23 DIAGNOSIS — K5909 Other constipation: Secondary | ICD-10-CM

## 2024-04-23 MED ORDER — SLOW RELEASE IRON 160 (50 FE) MG PO TBCR: EXTENDED_RELEASE_TABLET | ORAL | 3 refills | Status: DC

## 2024-04-23 NOTE — Assessment & Plan Note (Signed)
 Saw vestibular ENT clinic and dysautonomia cardiology clinic at [facility] - suspected POTS.  Pending tilt tablet testing.  Continue encouraging water, salt intake.  Will mail thigh high grade 2 compression stocking and abdominal binder Rx to take to local medical equipment store.

## 2024-04-23 NOTE — Assessment & Plan Note (Signed)
 Suspected POTS pending tilt table testing.

## 2024-04-23 NOTE — Assessment & Plan Note (Addendum)
 Recent iron  saturation levels low, but TIBC and oral iron  normal. Prior CBC 12/2023 without anemia.  Oral iron  at 28 mg FE 2 wks per month likely not sufficient to compensate for ongoing blood loss of heavy periods.  Will restart slow release iron  at 160mg  (50 FE) - take daily 2 weeks peri-menstruation, take every other day other 2 weeks of the month.  Will recheck iron  levels with CBC in 1 month.  If persistently low, consider iron  infusion. Has not received infusion previously.

## 2024-04-23 NOTE — Patient Instructions (Addendum)
 Restart flonase Start slow release iron  daily during cycle, otherwise every other day.  Schedule lab draw last week of May to check iron  levels.

## 2024-04-23 NOTE — Assessment & Plan Note (Addendum)
 Found to have mild L sided vestibular impairment planning to start vestibular rehab through [facility].

## 2024-04-23 NOTE — Assessment & Plan Note (Signed)
 Chronic issue, ongoing symptoms despite regular oral and nasal antihistamines. Rec trial flonase - reviewed correct administration strategy.  Describes heat sensation to throat, sinuses, face and nose - ?related to allergies.

## 2024-04-23 NOTE — Assessment & Plan Note (Signed)
 Notes oral iron  hasn't worsened this in the past.

## 2024-04-23 NOTE — Assessment & Plan Note (Deleted)
 Saw vestibular ENT clinic and dysautonomia cardiology clinic at [facility] - suspected POTS.  Pending tilt tablet testing.  Continue encouraging water, salt intake.  Will mail thigh high grade 2 compression stocking and abdominal binder Rx to take to local medical equipment store.

## 2024-04-23 NOTE — Progress Notes (Signed)
 Ph: [phone] Fax: [phone]   Patient ID: Caroline [name], female    DOB: 14-Jan-1991, 33 y.o.   MRN: [id number]  Virtual visit completed through MyChart, a video enabled telemedicine application. Due to national recommendations of social distancing due to COVID-19, a virtual visit is felt to be most appropriate for this patient at this time. Reviewed limitations, risks, security and privacy concerns of performing a virtual visit and the availability of in person appointments. I also reviewed that there may be a patient responsible charge related to this service. The patient agreed to proceed.   Patient location: home Provider location: [facility], office Persons participating in this virtual visit: patient, provider   If any vitals were documented, they were collected by patient at home unless specified below.    BP 119/88   Pulse 91   Temp 98.4 F (36.9 C)   Ht 5\' 7"  (1.702 m)   Wt 166 lb 6 oz (75.5 kg)   LMP 04/23/2024   BMI 26.06 kg/m    CC: follow up visit, 4 months Subjective:   HPI: Caroline [name] is a 33 y.o. female presenting on 04/23/2024 for Medical Management of Chronic Issues (C/o ongoing fatigue and sinus sxs. Seen on 04/17/24 for same sxs. )   Notes ongoing presyncope, dizziness.  LMP - started today.   Seen virtually 02/2024 for UTI and sinusitis - treated with cefdinir  300mg  BID 10d. She was having UTI symptoms as well as chills.   Saw Dr Cherlyn Cornet last week with acute exacerbation of fatigue in setting of heavy menses - labs showed low iron  saturation, otherwise normal iron  panel, UCx negative for infection, wet prep negative for infection.   This is despite 28 mg ferrous sulfate  oral iron  daily - normally takes 2 weeks per month. Restarted oral iron  6 days ago. LMP started today, normally 7 days, using pads and goes through 3-4 overnight pads per day, first 3 days heavy bleeding.  Limited iron  in diet.  Trouble tolerating OCP - currently off  this.   Notes ongoing face, sinuses, throat stays hot associated with chills, no fever. No frontal sinus headaches.  Takes zyrtec, allegra, astepro, recently restarted motrin 2-4/day.   Saw [facility] dysautonomia clinic Dr Norberta Beans 04/14/2024 - tachypalpitations with pre-syncope with standing. Likely POTS - planned tilt table testing. Rec 3 liters of water /day with electrolyte replacement, increased salt intake, minimizing caffeine. To consider compression stockings/abd binder. No driving. Planned f/u after tilt tablet testing (06/2024) and additional labs.   Saw [facility] vestibular ENT Dr Kaylie 04/07/2024 - videonystagmography showed subtle L vestibular impairment (26% weaker) and faint right-beating spontaneous nystagmus on exam, normal hearing. Rec continue vestibular rehab placed through [facility] (next week).   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 Planned annual follow up.     Relevant past medical, surgical, family and social history reviewed and updated as indicated. Interim medical history since our last visit reviewed. Allergies and medications reviewed and updated. Outpatient Medications Prior to Visit  Medication Sig Dispense Refill   cetirizine (ZYRTEC) 10 MG tablet Take 10 mg by mouth daily as needed for allergies.     Cholecalciferol  (VITAMIN D3) 25 MCG (1000 UT) CAPS Take 1 capsule by mouth daily.     co-enzyme Q-10 30 MG capsule Take 100 mg by mouth daily. URQuinol.     cyanocobalamin  (VITAMIN B12) 1000 MCG tablet Take 1,000 mcg by mouth 2 (two) times daily.  diphenhydrAMINE  (BENADRYL ) 25 mg capsule Take 50 mg by mouth every 6 (six) hours as needed for allergies or itching.     fexofenadine (ALLEGRA) 180 MG tablet Take by mouth.     fluticasone (FLONASE) 50 MCG/ACT nasal spray Place into both nostrils daily.     ibuprofen (ADVIL) 200 MG tablet Take by mouth.     LORazepam  (ATIVAN ) 0.5 MG tablet Take 0.5-1 tablets (0.25-0.5 mg  total) by mouth at bedtime as needed for anxiety or sleep. 20 tablet 0   metoprolol  tartrate (LOPRESSOR ) 25 MG tablet Take 0.5 tablet (12.5 mg) by mouth twice daily as needed for tachycardia 60 tablet 3   MOTEGRITY  1 MG TABS TAKE 1 TABLET DAILY 90 tablet 3   Multiple Vitamin (MULTIVITAMIN ADULT) TABS Take 1 tablet by mouth daily. Nature Made MVI for Her     Probiotic Product (PROBIOTIC-10 PO) Take 1 tablet by mouth daily.     Zinc  50 MG TABS Take by mouth. Takes 2 times weekly     Ferrous Sulfate  (IRON  PO) Take by mouth as needed.     No facility-administered medications prior to visit.     Per HPI unless specifically indicated in ROS section below Review of Systems Objective:  BP 119/88   Pulse 91   Temp 98.4 F (36.9 C)   Ht 5\' 7"  (1.702 m)   Wt 166 lb 6 oz (75.5 kg)   LMP 04/23/2024   BMI 26.06 kg/m   Wt Readings from Last 3 Encounters:  04/23/24 166 lb 6 oz (75.5 kg)  04/17/24 166 lb 4 oz (75.4 kg)  01/11/24 164 lb (74.4 kg)       Physical exam: Gen: alert, NAD, not ill appearing Pulm: speaks in complete sentences without increased work of breathing Psych: normal mood, normal thought content      Results for orders placed or performed in visit on 04/17/24  POCT Urinalysis Dipstick (Automated)   Collection Time: 04/17/24  3:54 PM  Result Value Ref Range   Color, UA Yellow    Clarity, UA Clear    Glucose, UA Negative Negative   Bilirubin, UA Negative    Ketones, UA Negative    Spec Grav, UA <=1.005 (A) 1.010 - 1.025   Blood, UA Negative    pH, UA 7.0 5.0 - 8.0   Protein, UA Negative Negative   Urobilinogen, UA 0.2 0.2 or 1.0 E.U./dL   Nitrite, UA Negative    Leukocytes, UA Trace (A) Negative  Hemoglobin A1c   Collection Time: 04/17/24  4:16 PM  Result Value Ref Range   Hgb A1c MFr Bld 5.1 4.8 - 5.6 %   Est. average glucose Bld gHb Est-mCnc 100 mg/dL  Iron  and TIBC   Collection Time: 04/17/24  4:16 PM  Result Value Ref Range   Total Iron  Binding Capacity  345 250 - 450 ug/dL   UIBC 528 413 - 244 ug/dL   Iron  47 27 - 159 ug/dL   Iron  Saturation 14 (L) 15 - 55 %  Wet Prep for Trick, Yeast, Clue   Collection Time: 04/17/24  4:19 PM   Specimen: Vaginal Swab   Vaginal Swab  Result Value Ref Range   Trichomonas Exam Negative Negative   Yeast Exam Negative Negative   Clue Cell Exam Negative Negative  Urine Culture   Collection Time: 04/17/24  4:20 PM   Specimen: Urine   Urine  Result Value Ref Range   Urine Culture, Routine Final report    Organism  ID, Bacteria Comment    Lab Results  Component Value Date   WBC 6.7 01/18/2024   HGB 12.7 01/18/2024   HCT 39.4 01/18/2024   MCV 87 01/18/2024   PLT 268 01/18/2024    Assessment & Plan:   Chronic fatigue Assessment & Plan: ?iron  def contributing - see below.    CONSTIPATION, CHRONIC Assessment & Plan: Notes oral iron  hasn't worsened this in the past.    Iron  deficiency Assessment & Plan: Recent iron  saturation levels low, but TIBC and oral iron  normal. Prior CBC 12/2023 without anemia.  Oral iron  at 28 mg FE 2 wks per month likely not sufficient to compensate for ongoing blood loss of heavy periods.  Will restart slow release iron  at 160mg  (50 FE) - take daily 2 weeks peri-menstruation, take every other day other 2 weeks of the month.  Will recheck iron  levels with CBC in 1 month.  If persistently low, consider iron  infusion. Has not received infusion previously.   Orders: -     Iron , TIBC and Ferritin Panel; Future -     CBC with Differential/Platelet; Future  Vasovagal near-syncope  Orthostatic intolerance Assessment & Plan: Saw vestibular ENT clinic and dysautonomia cardiology clinic at [facility] - suspected POTS.  Pending tilt tablet testing.  Continue encouraging water, salt intake.  Will mail thigh high grade 2 compression stocking and abdominal binder Rx to take to local medical equipment store.    Paroxysmal tachycardia (HCC) Assessment & Plan: Suspected POTS  pending tilt table testing.    Seasonal allergic rhinitis, unspecified trigger Assessment & Plan: Chronic issue, ongoing symptoms despite regular oral and nasal antihistamines. Rec trial flonase - reviewed correct administration strategy.  Describes heat sensation to throat, sinuses, face and nose - ?related to allergies.    Vestibular disorder, left Assessment & Plan: Found to have mild L sided vestibular impairment planning to start vestibular rehab through [facility].    Other orders -     Slow Release Iron ; Take 1 tablet daily for 2 weeks around periods, take every other day other 2 weeks of the month  Dispense: 80 tablet; Refill: 3     I discussed the assessment and treatment plan with the patient. The patient was provided an opportunity to ask questions and all were answered. The patient agreed with the plan and demonstrated an understanding of the instructions. The patient was advised to call back or seek an in-person evaluation if the symptoms worsen or if the condition fails to improve as anticipated.  Follow up plan: No follow-ups on file.  Claire Crick, MD

## 2024-04-23 NOTE — Assessment & Plan Note (Signed)
?  iron  def contributing - see below.

## 2024-04-25 DIAGNOSIS — R000 Tachycardia, unspecified: Secondary | ICD-10-CM | POA: Diagnosis not present

## 2024-04-25 DIAGNOSIS — R42 Dizziness and giddiness: Secondary | ICD-10-CM | POA: Diagnosis not present

## 2024-04-25 DIAGNOSIS — I951 Orthostatic hypotension: Secondary | ICD-10-CM | POA: Diagnosis not present

## 2024-04-25 DIAGNOSIS — R55 Syncope and collapse: Secondary | ICD-10-CM | POA: Diagnosis not present

## 2024-04-29 ENCOUNTER — Ambulatory Visit: Payer: Self-pay

## 2024-04-29 NOTE — Telephone Encounter (Signed)
 Agree with in-office evaluation. Will see tomorrow.

## 2024-04-29 NOTE — Telephone Encounter (Signed)
 Chief Complaint: dizziness and sob Symptoms: dizziness and sob, fatigue Frequency: x 1 week Pertinent Negatives: Patient denies fainting Disposition: [] ED /[] Urgent Care (no appt availability in office) / [x] Appointment(In office/virtual)/ []  [facility]/ [] Home Care/ [] Refused Recommended Disposition /[] [facility]/ []  Follow-up with PCP Additional Notes: pt states that any times she tries to stand that she gets dizzy. States that her O2 is still good but doesn't feel like she is getting enough oxygen. States fatigue. Pt states that she just doesn't feel good. States that she just doesn't feel good and has been like this for over a week. Pt states that she has been told by cards that she need sodium.   Copied from CRM [id number]. Topic: Clinical - Red Word Triage >> Apr 29, 2024 11:45 AM [employer] wrote: Red Word that prompted transfer to Nurse Triage: Shortness of breath, dizzy. Reason for Disposition  [1] MODERATE dizziness (e.g., interferes with normal activities) AND [2] has been evaluated by doctor (or NP/PA) for this  Answer Assessment - Initial Assessment Questions 1. DESCRIPTION: "Describe your dizziness."     lighthead 2. LIGHTHEADED: "Do you feel lightheaded?" (e.g., somewhat faint, woozy, weak upon standing)     Weak upon standing 3. VERTIGO: "Do you feel like either you or the room is spinning or tilting?" (i.e. vertigo)     no 4. SEVERITY: "How bad is it?"  "Do you feel like you are going to faint?" "Can you stand and walk?"   - MILD: Feels slightly dizzy, but walking normally.   - MODERATE: Feels unsteady when walking, but not falling; interferes with normal activities (e.g., school, work).   - SEVERE: Unable to walk without falling, or requires assistance to walk without falling; feels like passing out now.      mod 5. ONSET:  "When did the dizziness begin?"     ongoing 6. AGGRAVATING FACTORS: "Does anything make it worse?" (e.g., standing, change in head  position)     walking  8. CAUSE: "What do you think is causing the dizziness?"     unknown 9. RECURRENT SYMPTOM: "Have you had dizziness before?" If Yes, ask: "When was the last time?" "What happened that time?"     daily 10. OTHER SYMPTOMS: "Do you have any other symptoms?" (e.g., fever, chest pain, vomiting, diarrhea, bleeding)       fatigue  Protocols used: Dizziness - Lightheadedness-A-AH

## 2024-04-30 ENCOUNTER — Encounter: Payer: Self-pay | Admitting: Family Medicine

## 2024-04-30 ENCOUNTER — Ambulatory Visit: Admitting: Family Medicine

## 2024-04-30 VITALS — BP 100/60 | HR 101 | Temp 98.1°F | Ht 67.0 in | Wt 165.0 lb

## 2024-04-30 DIAGNOSIS — R5382 Chronic fatigue, unspecified: Secondary | ICD-10-CM | POA: Diagnosis not present

## 2024-04-30 DIAGNOSIS — E611 Iron deficiency: Secondary | ICD-10-CM | POA: Diagnosis not present

## 2024-04-30 DIAGNOSIS — R42 Dizziness and giddiness: Secondary | ICD-10-CM

## 2024-04-30 DIAGNOSIS — I951 Orthostatic hypotension: Secondary | ICD-10-CM

## 2024-04-30 MED ORDER — POLYSACCHARIDE IRON COMPLEX 150 MG PO CAPS: 150.0000 mg | ORAL_CAPSULE | Freq: Every day | ORAL | 3 refills | Status: DC

## 2024-04-30 NOTE — Progress Notes (Unsigned)
 Ph: [phone] Fax: [phone]   Patient ID: Caroline [name], female    DOB: 1991-01-15, 33 y.o.   MRN: [id number]  This visit was conducted in person.  BP 100/60   Pulse (!) 101   Temp 98.1 F (36.7 C)   Ht 5\' 7"  (1.702 m)   Wt 165 lb (74.8 kg)   LMP 04/23/2024   SpO2 98%   BMI 25.84 kg/m   Orthostatic Vitals for the past 48 hrs (Last 6 readings):  Patient Position Orthostatic BP BP Pulse  04/30/24 0924 -- -- 100/60 (!) 101  04/30/24 0925 Supine 100/80 -- --  04/30/24 0929 Standing 110/80 -- --   BP Readings from Last 3 Encounters:  04/30/24 100/60  04/23/24 119/88  04/17/24 (!) 120/90   CC: discuss dizziness, malaise Subjective:   HPI: Caroline [name] is a 33 y.o. female presenting on 04/30/2024 for Dizziness   See recent mychart message and last week's televisit.  Requests Rx Ferrex 150mg  in place of slo-release iron  recently sent - she states pharmacy filled regular ferrous sulfate  325mg  (65FE). .   Ongoing orthostatic dizziness, fatigue, malaise, shortness of breath, weakness, muscle twitching and cramping. She's been working from her bed because she feels dizzy even when sitting.  This is despite significantly increasing salt intake.  [facility] labs drawn Friday AM - no results yet available.   She has not taken metoprolol  recently. She has been taking lorazepam  0.5mg  1/2 tab at night for sleep - takes about twice weekly.   Tilt table testing scheduled for July 10th.      Relevant past medical, surgical, family and social history reviewed and updated as indicated. Interim medical history since our last visit reviewed. Allergies and medications reviewed and updated. Outpatient Medications Prior to Visit  Medication Sig Dispense Refill   cetirizine (ZYRTEC) 10 MG tablet Take 10 mg by mouth daily as needed for allergies.     Cholecalciferol  (VITAMIN D3) 25 MCG (1000 UT) CAPS Take 1 capsule by mouth daily.     co-enzyme Q-10 30 MG capsule Take 100 mg by mouth  daily. URQuinol.     cyanocobalamin  (VITAMIN B12) 1000 MCG tablet Take 1,000 mcg by mouth 2 (two) times daily.     diphenhydrAMINE  (BENADRYL ) 25 mg capsule Take 50 mg by mouth every 6 (six) hours as needed for allergies or itching.     fexofenadine (ALLEGRA) 180 MG tablet Take by mouth.     fluticasone (FLONASE) 50 MCG/ACT nasal spray Place into both nostrils daily.     ibuprofen (ADVIL) 200 MG tablet Take by mouth.     LORazepam  (ATIVAN ) 0.5 MG tablet Take 0.5-1 tablets (0.25-0.5 mg total) by mouth at bedtime as needed for anxiety or sleep. 20 tablet 0   metoprolol  tartrate (LOPRESSOR ) 25 MG tablet Take 0.5 tablet (12.5 mg) by mouth twice daily as needed for tachycardia 60 tablet 3   MOTEGRITY  1 MG TABS TAKE 1 TABLET DAILY 90 tablet 3   Multiple Vitamin (MULTIVITAMIN ADULT) TABS Take 1 tablet by mouth daily. Nature Made MVI for Her     Probiotic Product (PROBIOTIC-10 PO) Take 1 tablet by mouth daily.     Zinc  50 MG TABS Take by mouth. Takes 2 times weekly     ferrous sulfate  (SLOW RELEASE IRON ) 160 (50 Fe) MG TBCR SR tablet Take 1 tablet daily for 2 weeks around periods, take every other day other 2 weeks of the month (Patient not taking: Reported on 04/30/2024) 80  tablet 3   No facility-administered medications prior to visit.     Per HPI unless specifically indicated in ROS section below Review of Systems  Objective:  BP 100/60   Pulse (!) 101   Temp 98.1 F (36.7 C)   Ht 5\' 7"  (1.702 m)   Wt 165 lb (74.8 kg)   LMP 04/23/2024   SpO2 98%   BMI 25.84 kg/m   Wt Readings from Last 3 Encounters:  04/30/24 165 lb (74.8 kg)  04/23/24 166 lb 6 oz (75.5 kg)  04/17/24 166 lb 4 oz (75.4 kg)      Physical Exam Vitals and nursing note reviewed.  Constitutional:      Appearance: Normal appearance. She is not ill-appearing.  HENT:     Head: Normocephalic and atraumatic.     Mouth/Throat:     Mouth: Mucous membranes are moist.     Pharynx: Oropharynx is clear. No oropharyngeal exudate  or posterior oropharyngeal erythema.  Eyes:     Extraocular Movements: Extraocular movements intact.     Conjunctiva/sclera: Conjunctivae normal.     Pupils: Pupils are equal, round, and reactive to light.  Neck:     Thyroid : No thyroid  mass or thyromegaly.  Cardiovascular:     Rate and Rhythm: Normal rate and regular rhythm.     Pulses: Normal pulses.     Heart sounds: Normal heart sounds. No murmur heard. Pulmonary:     Effort: Pulmonary effort is normal. No respiratory distress.     Breath sounds: Normal breath sounds. No wheezing, rhonchi or rales.  Musculoskeletal:     Cervical back: Normal range of motion and neck supple.     Right lower leg: No edema.     Left lower leg: No edema.  Skin:    General: Skin is warm and dry.     Findings: No rash.  Neurological:     Mental Status: She is alert.  Psychiatric:        Mood and Affect: Mood normal.        Behavior: Behavior normal.       Results for orders placed or performed in visit on 04/17/24  POCT Urinalysis Dipstick (Automated)   Collection Time: 04/17/24  3:54 PM  Result Value Ref Range   Color, UA Yellow    Clarity, UA Clear    Glucose, UA Negative Negative   Bilirubin, UA Negative    Ketones, UA Negative    Spec Grav, UA <=1.005 (A) 1.010 - 1.025   Blood, UA Negative    pH, UA 7.0 5.0 - 8.0   Protein, UA Negative Negative   Urobilinogen, UA 0.2 0.2 or 1.0 E.U./dL   Nitrite, UA Negative    Leukocytes, UA Trace (A) Negative  Hemoglobin A1c   Collection Time: 04/17/24  4:16 PM  Result Value Ref Range   Hgb A1c MFr Bld 5.1 4.8 - 5.6 %   Est. average glucose Bld gHb Est-mCnc 100 mg/dL  Iron  and TIBC   Collection Time: 04/17/24  4:16 PM  Result Value Ref Range   Total Iron  Binding Capacity 345 250 - 450 ug/dL   UIBC 161 096 - 045 ug/dL   Iron  47 27 - 159 ug/dL   Iron  Saturation 14 (L) 15 - 55 %  Wet Prep for Dea Evert, Clue   Collection Time: 04/17/24  4:19 PM   Specimen: Vaginal Swab   Vaginal Swab   Result Value Ref Range   Trichomonas Exam Negative Negative  Yeast Exam Negative Negative   Clue Cell Exam Negative Negative  Urine Culture   Collection Time: 04/17/24  4:20 PM   Specimen: Urine   Urine  Result Value Ref Range   Urine Culture, Routine Final report    Organism ID, Bacteria Comment     Assessment & Plan:   Problem List Items Addressed This Visit     Chronic fatigue   Start Ferrex iron  tablets which she's tolerated well in the past  Monitor effect on chronic fatigue.       Orthostatic intolerance - Primary   Chronic, persists.  Planned vestibular rehab as well as further dysautonomia evaluation with tilt tablet testing through [facility] cardiology clinic. Recommend start wearing grade 2 thigh high compression stockings - Rx provided to take to local medical supply store.  Continue liberalizing salt intake - she notes this is what seems to help the most.  Discussed saltstick fastchews electrolyte supplement use.       Iron  deficiency   Start Ferrex iron  tablets which she's tolerated well in the past  Take daily for 2 weeks during menstrual cycle then every other day rest of month.       Dizziness   Ongoing severe thought due to orthostasis, currently being evaluated for dysautonomia by [facility] cardiology clinic. See below.         Meds ordered this encounter  Medications   iron  polysaccharides (FERREX 150) 150 MG capsule    Sig: Take 1 capsule (150 mg total) by mouth daily. Take daily for 2 weeks per month (menstrual cycle), every other day the rest of the month    Dispense:  80 capsule    Refill:  3    No orders of the defined types were placed in this encounter.   Patient Instructions  Start using thigh high compression stockings - take Rx to local medical supply store.  Continue liberalizing salt intake.  Start Ferrex 150mg  daily during menstrual cycle, every other day rest of the month.  Ok to try saltstick fastchews chewable electrolyte  replacement. Heart and lungs sound good today.   Follow up plan: Return if symptoms worsen or fail to improve.  Claire Crick, MD

## 2024-04-30 NOTE — Patient Instructions (Addendum)
 Start using thigh high compression stockings - take Rx to local medical supply store.  Continue liberalizing salt intake.  Start Ferrex 150mg  daily during menstrual cycle, every other day rest of the month.  Ok to try saltstick fastchews chewable electrolyte replacement. Heart and lungs sound good today.

## 2024-04-30 NOTE — Telephone Encounter (Signed)
Discussed at OV today.

## 2024-05-01 NOTE — Assessment & Plan Note (Addendum)
 Ongoing severe thought due to orthostasis, currently being evaluated for dysautonomia by [facility] cardiology clinic. See below.

## 2024-05-01 NOTE — Assessment & Plan Note (Addendum)
 Chronic, persists.  Planned vestibular rehab as well as further dysautonomia evaluation with tilt tablet testing through [facility] cardiology clinic. Recommend start wearing grade 2 thigh high compression stockings - Rx provided to take to local medical supply store.  Continue liberalizing salt intake - she notes this is what seems to help the most.  Discussed saltstick fastchews electrolyte supplement use.

## 2024-05-01 NOTE — Assessment & Plan Note (Signed)
 Start Ferrex iron  tablets which she's tolerated well in the past  Monitor effect on chronic fatigue.

## 2024-05-01 NOTE — Assessment & Plan Note (Signed)
 Start Ferrex iron  tablets which she's tolerated well in the past  Take daily for 2 weeks during menstrual cycle then every other day rest of month.

## 2024-05-07 DIAGNOSIS — N898 Other specified noninflammatory disorders of vagina: Secondary | ICD-10-CM | POA: Insufficient documentation

## 2024-05-07 DIAGNOSIS — R300 Dysuria: Secondary | ICD-10-CM | POA: Insufficient documentation

## 2024-05-07 NOTE — Assessment & Plan Note (Signed)
 Acute, send send out wet prep to evaluate for yeast, BV or Trichomonas.

## 2024-05-07 NOTE — Assessment & Plan Note (Signed)
 Acute, possibly secondary to vaginal symptoms vs partially treated cystitis. UA showed small amount of leukocytes.  Will send urine for culture.

## 2024-05-07 NOTE — Assessment & Plan Note (Signed)
Will evaluate with labs.

## 2024-05-08 ENCOUNTER — Ambulatory Visit: Payer: BC Managed Care – PPO | Admitting: Dermatology

## 2024-05-14 ENCOUNTER — Telehealth: Payer: BC Managed Care – PPO | Admitting: Neurology

## 2024-05-14 DIAGNOSIS — R000 Tachycardia, unspecified: Secondary | ICD-10-CM | POA: Diagnosis not present

## 2024-05-14 DIAGNOSIS — R42 Dizziness and giddiness: Secondary | ICD-10-CM | POA: Diagnosis not present

## 2024-05-14 DIAGNOSIS — M2550 Pain in unspecified joint: Secondary | ICD-10-CM

## 2024-05-14 DIAGNOSIS — R55 Syncope and collapse: Secondary | ICD-10-CM | POA: Diagnosis not present

## 2024-05-14 DIAGNOSIS — I951 Orthostatic hypotension: Secondary | ICD-10-CM | POA: Diagnosis not present

## 2024-05-14 NOTE — Progress Notes (Signed)
 [facility]    Provider:  Dr Tresia Fruit Referring Provider: Claire Crick, MD Primary Care Physician:  Claire Crick, MD  CC: multiple neurologic and other symptoms  Virtual Visit via Video Note  I connected with Caroline [name] on 05/14/2024 at  9:30 AM EDT by a video enabled telemedicine application and verified that I am speaking with the correct person using two identifiers.  Location: Patient: home Provider: office   I discussed the limitations of evaluation and management by telemedicine and the availability of in person appointments. The patient expressed understanding and agreed to proceed.   Follow Up Instructions:    I discussed the assessment and treatment plan with the patient. The patient was provided an opportunity to ask questions and all were answered. The patient agreed with the plan and demonstrated an understanding of the instructions.   The patient was advised to call back or seek an in-person evaluation if the symptoms worsen or if the condition fails to improve as anticipated.  I provided 30 minutes of non-face-to-face time during this encounter.   Glory Larsen, MD;  05/14/2024: EMG/NCS was normal Unclear etiology of multiple somatic and neurologic symptoms,  Hx of multiple of symptoms and has seen multiple specialists including rheumatology, cardiology, gastroenterology, hematology, ophthalmology, allergist, pending [facility]; I'm not quite sure what else to do for patient.. She has been to [facility] Cardiology, ENT, Audiology, Rheumatology, going to follow back up with [facility] for tilt-table testing. She has 14% iron  saturation and ferrition and following with primary care. Copper  toxicity resolved. She has seen Donah Frock and wants another referral would speak to primary care: MRIS 2017, 2022, 2023 stable no changes no need to repeat.   Notes per Dr. Sherie Dine Rheumatology 01/24/2024 [facility] "Caroline [name] is a 33 y.o.female who presents today  for follow up of polyarthralgia, abnormal blood level of copper , disequilibrium, muscle twitch, diarrhea, constipation, myalgia, chronic fatigue. She was last seen 03/28/2023 and was doing poorly with ongoing lack of clarity regarding the etiology of her symptoms. We assessed for an unidentified health condition not consistent with a rheumatologic etiology thus far, and re-referred to academic neurology and gastroenterology with [facility].Aaron AasAaron AasShe was evaluated by pulmonology and assessed for having Post-COVID syndrome. She was then referred to [facility] who concurred with this assessment, but unfortunately did not have much to offer her in the way of treatment. Her dysautonomia clinic evaluation is in the Spring, as is a visit with [facility]. " Saw ENT 02/25/2024: who stated: "She is scheduled for a [facility] clinic evaluation in April 2025.Differential Diagnosis: I do not suspect that her dizziness is otologic in origin but is likely multifactorial with a combination of vestibular migraines, vision changes, and possibly cardiovascular origins " and had VNG testing 03/31/2024: DXed Vestibular hypofunction of left ear .  Audiology Lindsey Rhodes: "Dizziness/Vertigo: [x]  See [facility] Vestibular Lab Summary dated today for additional details. Findings are consistent with a subtle left-sided vestibular impairment on the caloric exam (26% weaker)."  04/07/2024: ENT dysequilibrium: Subtle left sided vestibular impairment on caloric exam (26% weaker). There is a faint right-beating spontaneous nystagmus on exam today that is in agreement with the left sided caloric weakness. vHIT and rotary chair are within normal limits suggesting intact mid to high frequency VOR function.  Ocular motor exam is WNL and no evidence for active BPPV today.  Assessment: 33 yo female with history of constellation of symptoms, with unidentified etiology but secondary  to episodes related to  unidentified bug bite and Covid infections x2, resulting in systemic sympathetic flares with baseline dizziness, some improvements with vestibular therapy. Discussed her recent vestibular testing mostly compensated at this point with normal audiogram, but recommend she continue vestibular therapy.  Plan: - Referral for vestibular therapy placed through [facility]   03/25/2024: advised exercise, salt, compression, by [facility] is staffed by the following providers:   LAST MRI 2023 compared with prior stable: 1.  No acute intracranial pathology identified.  2.  Signal foci of high T2/FLAIR signal abnormality within the right corona radiata unchanged from prior.  Electronically Signed by: Bobby Burr, MD on 12/26/2022 4:17 PM Narrative  This result has an attachment that is not available. MRI brain without and with contrast:  INDICATION:  Night sweats  TECHNIQUE: Imaging of the brain was performed in 3 planes utilizing a combination of T1, FLAIR, and T2 weighting. Diffusion images were performed. In addition, sagittal and axial T1-weighted sequences were performed after intravenous injection of 7 mL Gadavist .  COMPARISON: MRI January 27, 2021  FINDINGS: #  Brain parenchyma: There is single foci of high T2/FLAIR signal abnormality within the right corona radiata unchanged from prior.  #   Diffusion-weighted images are normal indicating no acute infarct.  #   No evidence mass or hemorrhage.  No abnormal enhancement. #   #  Ventricles: Normal without dilatation, mass effect, or midline shift.  #  Sella and parasellar region: Normal. #  Craniovertebral junction: Normal.  #  Mastoids and paranasal sinuses: Clear. #  Orbits: Normal  #  Major cerebral vessels and dural sinuses: Patent. #  Bony structures and scalp: Unremarkable. Procedure Note  Lennette Quiver, DO - 12/26/2022 Formatting of this note might be different from the original. MRI brain without and  with contrast:  INDICATION:  Night sweats  TECHNIQUE: Imaging of the brain was performed in 3 planes utilizing a combination of T1, FLAIR, and T2 weighting. Diffusion images were performed. In addition, sagittal and axial T1-weighted sequences were performed after intravenous injection of 7 mL Gadavist .  COMPARISON: MRI January 27, 2021  FINDINGS: #  Brain parenchyma: There is single foci of high T2/FLAIR signal abnormality within the right corona radiata unchanged from prior.  #   Diffusion-weighted images are normal indicating no acute infarct.  #   No evidence mass or hemorrhage.  No abnormal enhancement. #   #  Ventricles: Normal without dilatation, mass effect, or midline shift.  #  Sella and parasellar region: Normal. #  Craniovertebral junction: Normal.  #  Mastoids and paranasal sinuses: Clear. #  Orbits: Normal  #  Major cerebral vessels and dural sinuses: Patent. #  Bony structures and scalp: Unremarkable.   IMPRESSION:  1.  No acute intracranial pathology identified.  2.  Signal foci of high T2/FLAIR signal abnormality within the right corona radiata unchanged from prior.   Interval history Neurology 03/05/2023;  Very nice patient that we saw in the past for what sounded like  vaso vagal vs orthostatic hypotension (feels dizzy, hot, vision changes, then feels weak, sitting and putting head in knees helps). Today she returns with a plethora of symptoms and has seen multiple specialists including rheumatology, cardiology, gastroenterology, hematology, ophthalmology, allergist, pending [facility]; I'm not quite sure what else to do for patient but she reports vision problems, tachycardia, orthostatic hypotension, dizziness, monocular vision loss, B12 deficiency, copper  abnormalities, shortness of breath, rashes, swelling in her hands  and bottom of the feet, lesions on the fingers, dry eyes and dry skin, dysautonomia, leg pain and leg weakness, urinary frequency, numbness  and tingling in the head, hearing issues with tinnitus, presyncope, abdominal pain, neck pain, dizziness, twitching, excessive daytime somnolence.  She has had extensive imaging and lab testing.  As we spoke there were few other labs that I thought of we could try but she has had such extensive labwork I can't think of anything else to test her for and she has had multiple MRIs of the rest of the neuro axis through the years.  We can repeat MRI of the brain given her new symptoms especially the concerning ones of new acute vision loss:   Her asthma and allergist thinks she has dysautonomoia and POTS and recommended a tilt table. She is going to [facility] but [facility] like [facility] needs [facility]  12/22/2022: 1.  No acute intracranial pathology identified.  2.  Signal foci of high T2/FLAIR signal abnormality within the right corona radiata unchanged from prior.  Electronically Signed by: Bobby Burr, MD on 12/26/2022 4:17 PM Narrative  This result has an attachment that is not available. MRI brain without and with contrast:  INDICATION:  Night sweats  TECHNIQUE: Imaging of the brain was performed in 3 planes utilizing a combination of T1, FLAIR, and T2 weighting. Diffusion images were performed. In addition, sagittal and axial T1-weighted sequences were performed after intravenous injection of 7 mL Gadavist .  COMPARISON: MRI January 27, 2021  FINDINGS: #  Brain parenchyma: There is single foci of high T2/FLAIR signal abnormality within the right corona radiata unchanged from prior.  #   Diffusion-weighted images are normal indicating no acute infarct.  #   No evidence mass or hemorrhage.  No abnormal enhancement. #   #  Ventricles: Normal without dilatation, mass effect, or midline shift.  #  Sella and parasellar region: Normal. #  Craniovertebral junction: Normal.  #  Mastoids and paranasal sinuses: Clear. #  Orbits: Normal  #  Major cerebral vessels and dural  sinuses: Patent. #  Bony structures and scalp: Unremarkable. Procedure Note  Lennette Quiver, DO - 12/26/2022 Formatting of this note might be different from the original. MRI brain without and with contrast:  INDICATION:  Night sweats  TECHNIQUE: Imaging of the brain was performed in 3 planes utilizing a combination of T1, FLAIR, and T2 weighting. Diffusion images were performed. In addition, sagittal and axial T1-weighted sequences were performed after intravenous injection of 7 mL Gadavist .  COMPARISON: MRI January 27, 2021  FINDINGS: #  Brain parenchyma: There is single foci of high T2/FLAIR signal abnormality within the right corona radiata unchanged from prior.  #   Diffusion-weighted images are normal indicating no acute infarct.  #   No evidence mass or hemorrhage.  No abnormal enhancement. #   #  Ventricles: Normal without dilatation, mass effect, or midline shift.  #  Sella and parasellar region: Normal. #  Craniovertebral junction: Normal.  #  Mastoids and paranasal sinuses: Clear. #  Orbits: Normal  #  Major cerebral vessels and dural sinuses: Patent. #  Bony structures and scalp: Unremarkable.   IMPRESSION:  1.  No acute intracranial pathology identified.  2.  Signal foci of high T2/FLAIR signal abnormality within the right corona radiata unchanged from prior.   Patient complains of symptoms per HPI as well as the following symptoms: risges on her nails . Pertinent negatives and positives per HPI. All  others negative   12/01/2022:Had covid in September and she cannot drive because everything she does her vision randomly goes out, her heart rate is through the roof, she has orthostatic hypotension. she is in a workup with cardiology with heart monitor and echo, she is very dizzy, saw eye doctor and macular degeneration at her age (she said it is starting), she has ophthalmology. [facility] Al[facility] Ingle, she lost vision in one eye over the weekend  right eye completely black no vision whatsoever 30 minutes later she saw an orange tint and woke up with resolution of symptoms at the time and color was affected. Her copper  was elevated but she has had some testing for Wilson's disease. B12 has been as low as 222 and she took supplements.  Below is a list of concerns:  July she saw hematologist for her metabolic blood abnormalities like low ferritin, copper , b12  She saw gastroenterology for copper  level and absorption issues Worsened with covid, in September, once she started testing negative she was getting SOB and couldn't breath she got an inhaler, she was given prednisone  and started having problems in her feet with swelling on the bottom and rashes She saw an allergy  specialist for multiple reactions like rash on the bottom of the feet.  Her hands are puffy and she has rashes on the bottom of her feet and maybe developing lesions on a finger. She has dry skin and one finger gets white and has lesions, dry eyes She has a referral to [facility] and gastro, is waiting for appointments She did well on the prednisone  symptoms improved and her heart symptoms improved,  But she had side effects to the prednisone  possibly the rash on the feet or unsure if it was the covid Oct 6 - 12 prednisone  treatment Had hives on the 14th on her feet benadryl  helped with the rash on the bottom of the feet(she showed me pictures, generalized redness and swelling with some elevated nodules then became lacy) She has dysautonomia her heart rate was up to 145 and labile - she was started on metoprolol  and she is seeing cardiology; She has labile blood pressure with orthostatic hypotension She has lots of bad pain and legs want to give out, urinary frequency, numbness and tingling in the head (points to the parietal area), sinus congestion, night sweats, Ear ache hearing loss with tinnitus, Presyncope, Belly atacks, pain lower right abdomen, dry heaving, Puffy feet and  rash, Hands have issues too Always has neck pain Had multiple mris stable Had mri cervical spine at [facility] unremarkable [facility]: MRI head w/wo 01/2021, c-spine, mri head 02/2020 and 11/2016 also 2016 had mri c spine and l spine Dizziness, floaters, vision randomly just "leaves" Feels terrible driving she doen't feel safe to drive.she has twitching in her toes.  Exhaustion Dry lips She has a facial rash (she was told it is rosacea) Drinking enough  No seizure like activity Episodes of having to go to sleep immediately  Patient complains of symptoms per HPI as well as the following symptoms: none that are not covered above . Pertinent negatives and positives per HPI. All others negative    HPI January 05, 2021: Patient is here as a new request for evaluation.  She has been seen in the past for multiple neurologic complaints including dizziness with no neurologic etiology, seen by ENT and multiple doctors for her dizziness and other neurologic/somatic issues.  MRI in the past showed stable incidental pineal cyst and  1 stable incidental T2 hyperintensity.  In the past lightheadedness/dizziness sounded more orthostatic in nature.  I reviewed Dr. Bishop Bullock notes, patient continues to have ongoing lightheadedness, she remains worried about incidental finding of pineal cyst, now with increasing insomnia requested return to us  for recheck.  I reviewed labs TSH and B12 normal.  She was advised to try melatonin, advised on sleep hygiene for her insomnia, benzodiazepine sparingly.  She developed insomnia in August. She also developed tremors (none visualized today), she developed twitches in the hands and legs, taking vitamins helps, more "tremors" in the feet which she describes more as twitching and "swayey" when she stands. She denies any inciting factors. She has tried to decrease her workload. When she tries to go to sleep she has fear and anxiety for absolutely no  Reason and all of a sudden, she  has turn the TV on and calm herself down and then it is fine, she can't figure out a trigger and it is odd, she wears a fit bit at night, sleep quality is poor her fit bit says she wakes up all the time. She is a very restless sleeper. She doesn't want a sleep study now. She has these episodes episodically, this week she had it twice, she has episodes of vision changes, problems with gait. Father is in the hospital he has multiple medical conditions.   Recent labs: B12 472, vitamin D  29.7, folate nml, tsh nml, sed rate  MRI brain 12/08/2016: IMPRESSION:  This MRI of the brain with and without contrast shows the following: 1.   Small T2/FLAIR hyperintense focus in the deep white matter of the right frontal lobe, unchanged in appearance when compared to the 12/14/2015 MRI. This is a nonspecific finding and is unlikely to be of clinical significance in isolation. 2.    8-9 mm benign-appearing pineal cyst, unchanged when compared to the 2016 MRI.  Personally reviewed imaging and agree with the above  hief Complaint Patient presents with Other Had covid for a 2nd time on sept 19th - was having trouble breathing - went to her pcp - not her pcp Was put on prednisone  on 10/6. Finished with the prednisone  Very shaky - at times feels like she is going to have syncopal episode - Her feet (bottoms) turned red and hives Did see eye doctor - has spots - and was told she had macular degeneration Saw allergy -had testing done - has not gotten the results yet - had to go back on antihistamines Ankles have been terrible Heart rate is racing also recently changes birth control so doesn't know what if That has anything to do with some of these symptoms  History of present illness:  Caroline [name] is a 33 y.o.female who presents today for follow up of copper  overload. She was last seen 08/15/2022 and was doing somewhat poorly with eye and sinus issues and general atopy but without evidence of rheumatologic  autoimmune disease. We obtained labs to rule out ANCA vasculitis or sjogren's syndrome but recommended GI evaluation for copper  overload.  Today She is doing worse than previously following infection with COVID in September, following which she was given prednisone  which seemed to provoke hives on the soles of her feet. The infection worsened her GI symptoms, caused increased presyncopal episodes and disequilibrium, and increased joint pains including but not limited to her MCPs.  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 She is a never smoker. She drinks an average of  0 servings of alcohol weekly. Jariyah reports a family history of osteoarthritis in her grandmother, thyroid  disease on her mother's side but denies any family history of known autoimmune disease otherwise. She is utilizing OCPs for contraception.  Review of Systems ROS was negative except as noted above.  Patient Active Problem List Diagnosis Date Noted Vitamin D  deficiency 03/19/2020 Dizziness of unknown cause 02/18/2020 Chronic fatigue 02/18/2020 Vitamin D  deficiency 02/18/2020 Disequilibrium 02/18/2020 History of vitamin D  deficiency 04/20/2016  Past Medical History: Diagnosis Date Acute otitis media Allergic rhinitis Colonic inertia Vitamin D  deficiency disease  Past Surgical History: Procedure Laterality Date left foot neuroma removal wisdom teeth extraction  Social History  Socioeconomic History Marital status: Single Tobacco Use Smoking status: Never Smokeless tobacco: Never Vaping Use Vaping Use: Never used Substance and Sexual Activity Alcohol use: Yes Comment: rarely Drug use: No Sexual activity: Never Social History Narrative works at [employer] (remote). She lives in [place] with her parents. She does not have kids. Occasional alcohol use, she has never smoked.  Family History Problem Relation Age of Onset Thyroid  disease Mother Abnormal EKG  Mother High blood pressure (Hypertension) Father High blood pressure (Hypertension) Maternal Grandmother Alzheimer's disease Maternal Grandmother Thyroid  disease Maternal Grandmother Coronary Artery Disease (Blocked arteries around heart) Maternal Grandmother Abnormal EKG Maternal Grandmother Arthritis Maternal Grandmother Coronary Artery Disease (Blocked arteries around heart) Maternal Grandfather Coronary Artery Disease (Blocked arteries around heart) Paternal Grandmother Colon cancer Paternal Grandmother rare colon cancer Coronary Artery Disease (Blocked arteries around heart) Paternal Grandfather  Physical Exam: BP 108/70 (BP Location: Left upper arm, Patient Position: Sitting, BP Cuff Size: Small Adult)  Temp 36.9 C (98.5 F) (Oral)  Wt 69.9 kg (154 lb)  BMI 24.11 kg/m General: Pleasant white woman sitting in chair. Well groomed, no acute distress. Non-toxic appearance. HEENT: Conjunctivae normal. Pulmonary: Normal effort of breathing. Symmetric chest expansion. Musculoskeletal: Tenderness to palpation of MCPs. No synovitis. Fist formation unimpaired bilaterally. Grip strength appropriate and equal. Negative squeeze tests to hands and feet. No other tender, synovitic, warm, erythematous, or deformed joints. FROM all joints except as above. Skin: Skin warm and dry. No rashes appreciable. Neurologic: Oriented to time, person, place, and situation. Normal gait. Psychological: Normal behavior, thought content, and judgment  Records were reviewed. Office Visit on 08/30/2022 Component Date Value Ref Range Status IgG 08/30/2022 913 588 - 1,573 mg/dL Final IgM 40/98/1191 91 57 - 237 mg/dL Final IgA 47/82/9562 55 46 - 287 mg/dL Final IgE 13/07/6577 <5 4 - 269 IU/mL Final GalactoseAlpha1,3GalactoseCapE - V* 08/30/2022 <0.10 <0.10 kU/L Final [facility] Test Results 09/28/2022 See Comments Final Tryptase 08/30/2022 4.3 <11.5 ng/mL Final Anti-Nuclear Antibody Screen 08/30/2022 Negative  Negative Final Orders Only on 08/15/2022 Component Date Value Ref Range Status White Blood Cell Count - [facility] 08/15/2022 7.5 3.4 - 10.8 x10E3/uL Final Red Blood Cell Count - [facility] 08/15/2022 4.46 3.77 - 5.28 x10E6/uL Final Hemoglobin - [facility] 08/15/2022 12.8 11.1 - 15.9 g/dL Final Hematocrit - [facility] 08/15/2022 38.9 34.0 - 46.6 % Final MCV - [facility] 08/15/2022 87 79 - 97 fL Final MCH - [facility] 08/15/2022 28.7 26.6 - 33.0 pg Final MCHC - [facility] 08/15/2022 32.9 31.5 - 35.7 g/dL Final RDW - [facility] 46/96/2952 12.3 11.7 - 15.4 % Final Platelets - [facility] 08/15/2022 271 150 - 450 x10E3/uL Final Neutrophils - [facility] 08/15/2022 60 Not Estab. % Final LYMPHS - [facility] 08/15/2022 32 Not Estab. % Final Monocytes - [facility] 08/15/2022 6 Not Estab. % Final Eos - [facility] 08/15/2022 1 Not Estab. % Final  Basos - [facility] 08/15/2022 1 Not Estab. % Final Neutrophils (Absolute) - [facility] 08/15/2022 4.5 1.4 - 7.0 x10E3/uL Final Lymphs (Absolute) - [facility] 08/15/2022 2.4 0.7 - 3.1 x10E3/uL Final Monocytes(Absolute) - [facility] 08/15/2022 0.4 0.1 - 0.9 x10E3/uL Final Eos (Absolute) - [facility] 08/15/2022 0.1 0.0 - 0.4 x10E3/uL Final Baso (Absolute) - [facility] 08/15/2022 0.0 0.0 - 0.2 x10E3/uL Final Immature Granulocytes - [facility] 08/15/2022 0 Not Estab. % Final Immature Grans (Abs) - [facility] 08/15/2022 0.0 0.0 - 0.1 x10E3/uL Final Protein Total - [facility] 08/15/2022 6.3 6.0 - 8.5 g/dL Final Albumin - [facility] 08/15/2022 4.1 4.0 - 5.0 g/dL Final Bilirubin Total - [facility] 08/15/2022 <0.2 0.0 - 1.2 mg/dL Final Bilirubin, Direct - [facility] 08/15/2022 <0.10 0.00 - 0.40 mg/dL Final Alkaline Phosphatase - [facility] 08/15/2022 59 44 - 121 IU/L Final AST (SGOT) - [facility] 08/15/2022 13 0 - 40 IU/L Final ALT (SGPT) - [facility] 08/15/2022 7 0 - 32 IU/L Final Anti-MPO Antibodies - [facility] 08/15/2022 <0.2 0.0 - 0.9 units Final Anti-PR3 Antibodies - [facility] 08/15/2022 <0.2 0.0 - 0.9 units Final Gytoplasmic (C-ANCA) - [facility]  08/15/2022 <1:20 Neg:<1:20 titer Final Perinuclear (P-ANCA) - [facility] 08/15/2022 <1:20 Neg:<1:20 titer Final Atypical pANCA - [facility] 08/15/2022 <1:20 Neg:<1:20 titer Final Sjogren's Anti-SS-A - [facility] 08/15/2022 <0.2 0.0 - 0.9 AI Final Sjogren's Anti-SS-B - [facility] 08/15/2022 <0.2 0.0 - 0.9 AI Final Creatinine - [facility] 08/15/2022 0.70 0.57 - 1.00 mg/dL Final EGFR (CKD-EPI 4098) - [facility] 08/15/2022 119 >59 mL/min/1.73 Final Sed Rate - [facility] 08/15/2022 23 0 - 32 mm/hr Final C Reactive Protein - [facility] 08/15/2022 9 0 - 10 mg/L Final  Assessment and Plan: Polyarthralgia (primary encounter diagnosis) Plan: Ambulatory Referral to Neurology, Ambulatory Referral to Gastroenterology, Actin (Smooth Muscle) Antibody - [facility], Ferritin, Serum - [facility], Iron  and TIBC - [facility], Mitochondrial (M2) Antibody -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

## 2024-05-21 DIAGNOSIS — E611 Iron deficiency: Secondary | ICD-10-CM | POA: Diagnosis not present

## 2024-05-22 ENCOUNTER — Ambulatory Visit: Payer: Self-pay | Admitting: Family Medicine

## 2024-05-22 DIAGNOSIS — L509 Urticaria, unspecified: Secondary | ICD-10-CM

## 2024-05-22 LAB — CBC WITH DIFFERENTIAL/PLATELET
Basophils Absolute: 0 10*3/uL (ref 0.0–0.2)
Basos: 1 %
EOS (ABSOLUTE): 0.1 10*3/uL (ref 0.0–0.4)
Eos: 2 %
Hematocrit: 41.3 % (ref 34.0–46.6)
Hemoglobin: 13.1 g/dL (ref 11.1–15.9)
Immature Grans (Abs): 0 10*3/uL (ref 0.0–0.1)
Immature Granulocytes: 0 %
Lymphocytes Absolute: 2.4 10*3/uL (ref 0.7–3.1)
Lymphs: 37 %
MCH: 28.7 pg (ref 26.6–33.0)
MCHC: 31.7 g/dL (ref 31.5–35.7)
MCV: 90 fL (ref 79–97)
Monocytes Absolute: 0.4 10*3/uL (ref 0.1–0.9)
Monocytes: 7 %
Neutrophils Absolute: 3.6 10*3/uL (ref 1.4–7.0)
Neutrophils: 53 %
Platelets: 242 10*3/uL (ref 150–450)
RBC: 4.57 x10E6/uL (ref 3.77–5.28)
RDW: 12.8 % (ref 11.7–15.4)
WBC: 6.5 10*3/uL (ref 3.4–10.8)

## 2024-05-22 LAB — IRON,TIBC AND FERRITIN PANEL
Ferritin: 20 ng/mL (ref 15–150)
Iron Saturation: 11 % — ABNORMAL LOW (ref 15–55)
Iron: 41 ug/dL (ref 27–159)
Total Iron Binding Capacity: 360 ug/dL (ref 250–450)
UIBC: 319 ug/dL (ref 131–425)

## 2024-05-26 ENCOUNTER — Encounter: Payer: Self-pay | Admitting: Family Medicine

## 2024-05-26 ENCOUNTER — Ambulatory Visit: Payer: Self-pay

## 2024-05-26 ENCOUNTER — Telehealth: Admitting: Family Medicine

## 2024-05-26 VITALS — BP 136/89 | HR 109 | Ht 67.0 in

## 2024-05-26 DIAGNOSIS — R55 Syncope and collapse: Secondary | ICD-10-CM

## 2024-05-26 DIAGNOSIS — I479 Paroxysmal tachycardia, unspecified: Secondary | ICD-10-CM | POA: Diagnosis not present

## 2024-05-26 DIAGNOSIS — E611 Iron deficiency: Secondary | ICD-10-CM

## 2024-05-26 DIAGNOSIS — I951 Orthostatic hypotension: Secondary | ICD-10-CM

## 2024-05-26 DIAGNOSIS — R5382 Chronic fatigue, unspecified: Secondary | ICD-10-CM | POA: Diagnosis not present

## 2024-05-26 DIAGNOSIS — R42 Dizziness and giddiness: Secondary | ICD-10-CM | POA: Diagnosis not present

## 2024-05-26 NOTE — Telephone Encounter (Signed)
 Plz see today's Nurse Triage & Results F/u notes. Pt has virtual visit today at 2:00 with Dr Geralyn Knee. Fyi to Dr Crissie Dome and Dr Vallarie Gauze.   Pt also, has virtual visit 05/27/24 with Dr Crissie Dome at 2:30.

## 2024-05-26 NOTE — Progress Notes (Signed)
 Averly Ericson T. Amani Marseille, MD, CAQ Sports Medicine [facility] at [facility] [address] [place], [address]  Phone: [phone]  FAX: [phone]  LAKESHIA DOHNER - 33 y.o. female  MRN [id number]  Date of Birth: 01/27/1991  Date: 05/26/2024  PCP: Claire Crick, MD  Referral: Claire Crick, MD  Chief Complaint  Patient presents with   Menorrhagia   Elevated Heart Rate   Fatigue   Shortness of Breath        Virtual Visit via Video Note:  I connected with  BOOTS MCGLOWN on 05/26/2024  2:00 PM EDT by a video enabled telemedicine application and verified that I am speaking with the correct person using two identifiers.   Location patient: home computer, tablet, or smartphone Location provider: work or home office Consent: Verbal consent directly obtained from Renette Carton. Persons participating in the virtual visit: patient, provider  I discussed the limitations of evaluation and management by telemedicine and the availability of in person appointments. The patient expressed understanding and agreed to proceed.  Chief Complaint  Patient presents with   Menorrhagia   Elevated Heart Rate   Fatigue   Shortness of Breath         History of Present Illness:  I have read through the chart and read multiple different office visits and various communications by the patient to her primary care doctor.  She presents today and feels unwell compared to her baseline state of health.  She has been sitting predominantly in the bed, because when she stands up and moves she has a very high pulse rate and feels as if she is going to pass out.  She overall does not feel well at all. - She had some concerns about her iron  panel, and she felt like this might be contributing. - Hemoglobin is 13.1 -Ferritin is 20.  Iron  level and TIBC are normal.  Mildly low iron  saturation.  She appears to have some chronic disequilibrium, postural dizziness, orthostasis,  syncopal episodes.  She saw cardiology and neurology about 2 weeks ago.  EMG and nerve conduction studies were normal. She has seen rheumatology, cardiology, gastroenterology, hematology, ophthalmology, audiology, ENT, allergy . Cardiology has ordered a event monitor, and also a tilt table test. - 2 prior event monitors were essentially unremarkable.  Reviewed by cardiology previously.  ENT did not feel that her symptoms were otologic.  VNG, March 31, 2024, vestibular hypofunction of the left ear. She has actually already seen the [facility] syncope and dysautonomia clinic.  I reviewed the data that she sent and and shows some at times mildly elevated blood pressures  She was previously on a very low-dose of metoprolol  The [facility] POTS clinic did recommend that she start midodrine, and she has not started this.  Review of Systems as above: See pertinent positives and pertinent negatives per HPI No acute distress verbally   Observations/Objective/Exam:  An attempt was made to discern vital signs over the phone and per patient if applicable and possible.   General:    Alert, Oriented, appears well and in no acute distress  Pulmonary:     On inspection no signs of respiratory distress.  Psych / Neurological:     Pleasant and cooperative.  Assessment and Plan:    ICD-10-CM   1. Orthostatic intolerance  I95.1     2. Chronic fatigue  R53.82     3. Dizziness  R42     4. Vasovagal near-syncope  R55  5. Paroxysmal tachycardia (HCC)  I47.9     6. Iron  deficiency  E61.1      Total encounter time: 30 minutes. This includes total time spent on the day of encounter.  Long conversation with the patient and chart review.  She has had some extensive workups by multiple different subspecialists with her symptoms.  She is currently seeing the dysautonomia clinic at [facility], and they have an upcoming tilt table test.  Concern for possible POTS.  She did have a low-dose beta-blocker  previously, but cardiology ultimately stopped it.  The dysautonomia clinic did recommend that she start midodrine, but she has not started that yet.  She had some concerns with some mildly elevated blood pressures.  I think that this is a good idea, and is often used with POTS or other symptoms with paroxysmal tachycardia and orthostasis.  We talked about it, and I think starting on 2.5 mg is reasonable.  Her pills are 5 mg, so she can cut them.  With a normal hemoglobin and a low normal ferritin, I am not sure if this is causing any of any of her symptoms.  She would like to pursue an iron  infusion.  I will alert Dr. Mariam Shingles.  With her pulse rate going up to greater than 150 for relatively extended periods of time, a repeat heart monitor as ordered by cardiology would make some sense.  She is going to have a follow-up in person office visit tomorrow, where additional testing and lab work can be done.  I discussed the assessment and treatment plan with the patient. The patient was provided an opportunity to ask questions and all were answered. The patient agreed with the plan and demonstrated an understanding of the instructions.   The patient was advised to call back or seek an in-person evaluation if the symptoms worsen or if the condition fails to improve as anticipated.  Follow-up: prn unless noted otherwise below No follow-ups on file.  No orders of the defined types were placed in this encounter.  No orders of the defined types were placed in this encounter.   Signed,  Ranny Bye. Kyira Volkert, MD

## 2024-05-26 NOTE — Telephone Encounter (Signed)
 Will await OV note.  Thanks.

## 2024-05-26 NOTE — Telephone Encounter (Signed)
  Chief Complaint: activity intolerance, elevated HR intermittently Symptoms: feels like she is going to pass out with exertion, get SOB, frustrated Frequency: 2023- seeking iron  infusion Pertinent Negatives: Patient denies passing out Disposition: [] ED /[] Urgent Care (no appt availability in office) / [x] Appointment(In office/virtual)/ []  [place] [facility]/ [] Home Care/ [] Refused Recommended Disposition /[] [place] [facility]/ []  Follow-up with PCP Additional Notes: Pt very frustrated at care and wanting iron  infusion ASAP HR 90's at end of call Copied from CRM #[id number]. Topic: Clinical - Red Word Triage >> May 26, 2024  9:31 AM Eleanore Grey wrote: Red Word that prompted transfer to Nurse Triage: Heart rate has went up  to 156 sitting and standing. Patient also stated she is having trouble catching her breath and speaking. Has appointment scheduled tomorrow with PCP but says she may end up having to go to ER. Asking about iron  infusion. Reason for Disposition  [1] Heart beating very rapidly (e.g., > 140 / minute) AND [2] not present now  (Exception: During exercise.)  Answer Assessment - Initial Assessment Questions 1. DESCRIPTION: "Please describe your heart rate or heartbeat that you are having" (e.g., fast/slow, regular/irregular, skipped or extra beats, "palpitations")     Fast HR  2. ONSET: "When did it start?" (Minutes, hours or days)      *No Answer* 3. DURATION: "How long does it last" (e.g., seconds, minutes, hours)     *No Answer* 4. PATTERN "Does it come and go, or has it been constant since it started?"  "Does it get worse with exertion?"   "Are you feeling it now?"     *No Answer* 5. TAP: "Using your hand, can you tap out what you are feeling on a chair or table in front of you, so that I can hear?" (Note: not all patients can do this)       *No Answer* 6. HEART RATE: "Can you tell me your heart rate?" "How many beats in 15 seconds?"  (Note: not all patients can do this)        136 7. RECURRENT SYMPTOM: "Have you ever had this before?" If Yes, ask: "When was the last time?" and "What happened that time?"      *No Answer* 8. CAUSE: "What do you think is causing the palpitations?"     Iron   9. CARDIAC HISTORY: "Do you have any history of heart disease?" (e.g., heart attack, angina, bypass surgery, angioplasty, arrhythmia)      *No Answer* 10. OTHER SYMPTOMS: "Do you have any other symptoms?" (e.g., dizziness, chest pain, sweating, difficulty breathing)       SOB, weakness  Protocols used: Heart Rate and Heartbeat Questions-A-AH

## 2024-05-26 NOTE — Telephone Encounter (Signed)
 Routed to PCP as FYI.  She has visit scheduled for 05/26/24.

## 2024-05-26 NOTE — Telephone Encounter (Signed)
 Pt has virtual visit today at 2:00 with Dr Geralyn Knee. Fy to Dr Vallarie Gauze and Dr Crissie Dome.  (See today's Results F/u note & today's pt msg)

## 2024-05-27 ENCOUNTER — Telehealth: Payer: Self-pay | Admitting: Family Medicine

## 2024-05-27 ENCOUNTER — Ambulatory Visit: Payer: Self-pay

## 2024-05-27 ENCOUNTER — Ambulatory Visit: Admitting: Family Medicine

## 2024-05-27 ENCOUNTER — Telehealth: Payer: Self-pay | Admitting: Internal Medicine

## 2024-05-27 ENCOUNTER — Encounter: Payer: Self-pay | Admitting: Primary Care

## 2024-05-27 ENCOUNTER — Ambulatory Visit: Admitting: Primary Care

## 2024-05-27 VITALS — BP 122/64 | HR 110 | Temp 97.9°F | Ht 67.0 in | Wt 164.0 lb

## 2024-05-27 DIAGNOSIS — I479 Paroxysmal tachycardia, unspecified: Secondary | ICD-10-CM | POA: Diagnosis not present

## 2024-05-27 DIAGNOSIS — E611 Iron deficiency: Secondary | ICD-10-CM

## 2024-05-27 DIAGNOSIS — I951 Orthostatic hypotension: Secondary | ICD-10-CM | POA: Diagnosis not present

## 2024-05-27 NOTE — Patient Instructions (Signed)
 Complete the Holter monitor when able.  Proceed with the tilt table test as scheduled.  Dr. Mariam Shingles will be in touch with you regarding the iron  fusion.  It was a pleasure meeting you!

## 2024-05-27 NOTE — Telephone Encounter (Signed)
 Patient evaluated.

## 2024-05-27 NOTE — Assessment & Plan Note (Addendum)
 Progressing and certainly causing disruption during ADLs. Negative for orthostatic changes in the office today.  Reviewed neurology notes from May 2025. Reviewed cardiology, ENT, and audiology notes from April 2025 through [facility] Every[facility].  POTS is a differential diagnosis. Reviewed recent iron  studies per PCP from May 2025.  Reviewed multiple other labs.  Do not see that need for additional labs given the chronicity of her symptoms coupled with recent labs.  She agrees.  Complete Holter monitor and tilt table test.

## 2024-05-27 NOTE — Assessment & Plan Note (Signed)
 Reviewed iron  panel from May 2025.  Overall appears stable with the exception of a decrease in iron  saturation. Will defer iron  infusion to PCP.

## 2024-05-27 NOTE — Telephone Encounter (Signed)
 Chief Complaint: appt  Disposition: [] ED /[] Urgent Care (no appt availability in office) / [x] Appointment(In office/virtual)/ []  [place] [facility]/ [] Home Care/ [] Refused Recommended Disposition /[] [place] [facility]/ []  Follow-up with PCP Additional Notes: Pt received a message about Dr Mariam Shingles being out and her being seen by a different provider. Pt was call in to ask if that provider would be able to do anything for her and to confirm appt. Called [facility] to get pt scheduled as NT didn't show appt available.   Copied from CRM [id number]. Topic: Clinical - Red Word Triage >> May 27, 2024  9:17 AM [name] wrote: Red Word that prompted transfer to Nurse Triage: patient returning a call, patient is very frustrated and is wondering if it's worth scheduling appt with Dr Cherlyn Cornet. Patient is not feeling well, heart rate fluctuates Reason for Disposition  [1] Caller requesting NON-URGENT health information AND [2] PCP's office is the best resource  Protocols used: Information Only Call - No Triage-A-AH

## 2024-05-27 NOTE — Assessment & Plan Note (Signed)
 Agree that symptoms could be POTS, discussed this today.  Proceed with tilt table testing. Proceed with Holter monitor as directed.  Consider different beta blocker such as Bystolic. Will defer to her treatment team.   Reviewed cardiology notes from April 2025 through [facility] Everywhere.

## 2024-05-27 NOTE — Progress Notes (Signed)
 Subjective:    Patient ID: Caroline [name], female    DOB: [birth year], 33 y.o.   MRN: [id number]  HPI  Caroline [name] is a very pleasant 33 y.o. female patient of Dr. Mariam Shingles with a history of overactive bladder, vasovagal near syncope, paresthesias, dizziness who presents today   She was evaluated by Dr. Geralyn Knee virtually yesterday for postural dizziness, tachycardia, disequilibrium. She has a chronic history of the symptoms and has had extensive workup per cardiology, neurology, GI, hematology, ophthalmology, audiology, ENT, allergy .  She is currently pending a Holter monitor and tilt table test per cardiology to evaluate for POTS.  Also following with the syncope and dysautonomia clinic through [facility] who advised to start midodrine for which she has yet to do so.  During her virtual visit yesterday it was agreed upon by Dr. Geralyn Knee and the patient to start midodrine 2.5 mg.   She also had questions about her iron  levels.  Iron  levels obtained on 05/21/2024 which showed ferritin of 20, TIBC within normal range, iron  saturation of 11.    Today she discusses continued symptoms of palpitations and tachycardia, near syncope, cramping to her feet after standing for longer than 10 minutes. She has yet to start the Holter monitor as she's having a "flare". She's unable to walk to her mailbox, walk around her home, unable to shower at all due to her symptoms. Her heart rate has ranged between 60-160s since she began tracking in early May.   She took midodrine 2.5 mg once and experienced chest tightness, numbness to her head, and body chills. She has not taken since. She is managed on oral iron , iron  polysaccharides 150 mg daily for the last 18 days, and has been taking some form of daily iron  for a few months. She continues to experience heavy menses with clots for 3 out of 7 days of her menstrual cycles. No breakthrough bleeding.   She was previously managed on metoprolol  for her symptoms which helped some  but was told she didn't need to take it given her normal heart structure. She has not tried a different beta blocker.   She would like a stress test, has asked for this for years, "no one will give this to me". She would like an iron  infusion. Her PCP notified her that this could be done with continued low iron  levels as she has not responded to oral iron .   BP Readings from Last 3 Encounters:  05/27/24 122/64  05/26/24 136/89  04/30/24 100/60     Review of Systems  Respiratory:  Positive for shortness of breath.   Cardiovascular:  Positive for palpitations.  Neurological:  Positive for dizziness and light-headedness.         Past Medical History:  Diagnosis Date   Chronic fatigue 06/26/2018   Colonic inertia 2007   Dr Cathe Clore ([facility])   CONSTIPATION, CHRONIC 05/30/2010   H/o colonic inertia   COVID-19 virus infection 10/24/2022   Dysplastic nevus 11/19/2019   Left upper abdomen. Mild atypia.   High blood copper  level 10/07/2021   Unclear cause.   She had home water tested with normal results.   Ceruloplasmin when checked was not low.   24 hour urine copper  levels were normal.   Managing with zinc  and low copper  diet.   Saw heme - ?GI issue, planned GI f/u.    History of chicken pox    Hives 08/16/2021   Urticaria to OxyCide cleaner   Orthostatic intolerance 06/20/2019  Paroxysmal tachycardia (HCC) 05/12/2021   S/p cardiac eval (Dr [place], Dr Jerelene Monday)   Seasonal allergic rhinitis    White matter abnormality on MRI of brain 01/31/2016   Small T2/FLAIR hyperintense focus in the deep white matter of the right frontal lobe, unchanged in appearance when compared to the 12/14/2015 MRI. This is a nonspecific finding and is unlikely to be of clinical significance in isolation. 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   Occupation: Primary school teacher: [employer]  Tobacco Use   Smoking status: Never   Smokeless tobacco: Never  Vaping Use   Vaping status: Never Used  Substance and Sexual Activity   Alcohol use: Yes    Comment: on holidays   Drug use: No   Sexual activity: Yes    Birth control/protection: None  Other Topics Concern   Not on file  Social History Narrative   Caffeine use: 1 cup coffee/day and some tea   Lives at home with mother, father, and sister and pets (horses, ducks, Programme researcher, broadcasting/film/video, fish, wild [place])   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, 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   Edu: Environmental manager and Market researcher   Activity: Walking   Diet: good water, fruits/vegetables daily      Social Drivers of Corporate investment banker Strain: Low Risk  (01/11/2024)   Overall Financial Resource Strain (CARDIA)    Difficulty of Paying Living Expenses: Not very hard  Food Insecurity: No Food Insecurity (01/11/2024)   Hunger Vital Sign    Worried About Running Out of Food in the Last Year: Never true    Ran Out of Food in the Last Year: Never true  Transportation Needs: Patient Declined (01/11/2024)   PRAPARE - Transportation    Lack of Transportation (Medical): Patient declined    Lack of Transportation (Non-Medical): Patient declined  Physical Activity: Insufficiently Active (01/11/2024)   Exercise Vital Sign    Days of Exercise per Week: 1 day    Minutes of Exercise per Session: 60 min  Stress: No Stress Concern Present (01/11/2024)   [employer] of Occupational Health - Occupational Stress Questionnaire    Feeling of Stress : Not at all  Social Connections: Moderately Isolated (01/11/2024)   Social Connection and Isolation Panel [NHANES]    Frequency of Communication with Friends and Family: More than three times a week    Frequency of Social Gatherings with Friends  and Family: More than three times a week    Attends Religious Services: 1 to 4 times per year    Active Member of [employer] or Organizations: No    Attends Engineer, structural: Not on file    Marital Status: Never married  Intimate Partner Violence: Unknown (03/30/2022)   Received from [employer], [facility]   HITS    Physically Hurt: Not on file    Insult or Talk Down To: Not on file    Threaten Physical Harm: Not on file    Scream or Curse: Not on file    Past Surgical History:  Procedure Laterality Date   COLONOSCOPY  2010   WNL Willy Harvest)   EXCISION MORTON'S NEUROMA  Left 2007   IR 3D INDEPENDENT WKST  08/03/2023   IR ANGIO INTRA EXTRACRAN SEL COM CAROTID INNOMINATE BILAT MOD SED  08/03/2023   IR ANGIO VERTEBRAL SEL VERTEBRAL BILAT MOD SED  08/03/2023   IR RADIOLOGIST EVAL & MGMT  07/02/2023   IR RADIOLOGIST EVAL & MGMT  09/10/2023   IR RADIOLOGIST EVAL & MGMT  10/04/2023   IR US  GUIDE VASC ACCESS RIGHT  08/03/2023   WISDOM TOOTH EXTRACTION  2010    Family History  Problem Relation Age of Onset   Hypothyroidism Mother    Hypertension Mother    Liver disease Father    Colon cancer Father    Hypertension Father    Cancer Father        pancreas   Cirrhosis Father    COPD Father    Atrial fibrillation Father    Diabetes Father    Hypertension Maternal Grandmother    Heart failure Maternal Grandmother    Dementia Maternal Grandmother    Stroke Maternal Grandmother    Hypertension Paternal Grandmother    Cancer Paternal Grandmother        rare colon cancer   Colon cancer Paternal Aunt     Allergies  Allergen Reactions   Amoxicillin  Other (See Comments)    Does not tolerate larger doses of amoxicillin , tolerates 500mg  TID well   Doxycycline Nausea And Vomiting   Linzess [Linaclotide] Other (See Comments)    Bloating, abd pain   Neomycin     Tested weakly positive by patch test   Nickel Itching   Other Rash    Oxycide - cleaner used at hospital. Caused rash all  over.     Current Outpatient Medications on File Prior to Visit  Medication Sig Dispense Refill   Cholecalciferol  (VITAMIN D3) 25 MCG (1000 UT) CAPS Take 1 capsule by mouth daily.     cyanocobalamin  (VITAMIN B12) 1000 MCG tablet Take 1,000 mcg by mouth 2 (two) times daily.     fexofenadine (ALLEGRA) 180 MG tablet Take by mouth.     fluticasone (FLONASE) 50 MCG/ACT nasal spray Place into both nostrils daily.     ibuprofen (ADVIL) 200 MG tablet Take by mouth.     iron  polysaccharides (FERREX 150) 150 MG capsule Take 1 capsule (150 mg total) by mouth daily. Take daily for 2 weeks per month (menstrual cycle), every other day the rest of the month 80 capsule 3   LORazepam  (ATIVAN ) 0.5 MG tablet Take 0.5-1 tablets (0.25-0.5 mg total) by mouth at bedtime as needed for anxiety or sleep. 20 tablet 0   midodrine (PROAMATINE) 5 MG tablet Take 5 mg by mouth 3 (three) times daily with meals.     MOTEGRITY  1 MG TABS TAKE 1 TABLET DAILY 90 tablet 3   Multiple Vitamin (MULTIVITAMIN ADULT) TABS Take 1 tablet by mouth daily. Nature Made MVI for Her     Probiotic Product (PROBIOTIC-10 PO) Take 1 tablet by mouth daily.     cetirizine (ZYRTEC) 10 MG tablet Take 10 mg by mouth daily as needed for allergies. (Patient not taking: Reported on 05/27/2024)     co-enzyme Q-10 30 MG capsule Take 100 mg by mouth daily. URQuinol. (Patient not taking: Reported on 05/27/2024)     diphenhydrAMINE  (BENADRYL ) 25 mg capsule Take 50 mg by mouth every 6 (six) hours as needed for allergies or itching. (Patient not taking: Reported on 05/27/2024)     metoprolol  tartrate (LOPRESSOR ) 25 MG tablet Take 0.5 tablet (12.5 mg)  by mouth twice daily as needed for tachycardia (Patient not taking: Reported on 05/27/2024) 60 tablet 3   Zinc  50 MG TABS Take by mouth. Takes 2 times weekly (Patient not taking: Reported on 05/27/2024)     No current facility-administered medications on file prior to visit.    BP 122/64   Pulse (!) 110   Temp 97.9 F  (36.6 C) (Temporal)   Ht 5\' 7"  (1.702 m)   Wt 164 lb (74.4 kg)   LMP 05/18/2024   SpO2 99%   BMI 25.69 kg/m  Objective:   Physical Exam Cardiovascular:     Rate and Rhythm: Normal rate and regular rhythm.  Pulmonary:     Effort: Pulmonary effort is normal.     Breath sounds: Normal breath sounds.  Musculoskeletal:     Cervical back: Neck supple.  Skin:    General: Skin is warm and dry.  Neurological:     Mental Status: She is alert and oriented to person, place, and time.  Psychiatric:        Mood and Affect: Mood normal.           Assessment & Plan:  Paroxysmal tachycardia (HCC) Assessment & Plan: Agree that symptoms could be POTS, discussed this today.  Proceed with tilt table testing. Proceed with Holter monitor as directed.  Consider different beta blocker such as Bystolic. Will defer to her treatment team.   Reviewed cardiology notes from April 2025 through Care Everywhere.   Orthostatic intolerance Assessment & Plan: Progressing and certainly causing disruption during ADLs. Negative for orthostatic changes in the office today.  Reviewed neurology notes from May 2025. Reviewed cardiology, ENT, and audiology notes from April 2025 through [facility] Everywhere.  POTS is a differential diagnosis. Reviewed recent iron  studies per PCP from May 2025.  Reviewed multiple other labs.  Do not see that need for additional labs given the chronicity of her symptoms coupled with recent labs.  She agrees.  Complete Holter monitor and tilt table test.    Iron  deficiency Assessment & Plan: Reviewed iron  panel from May 2025.  Overall appears stable with the exception of a decrease in iron  saturation. Will defer iron  infusion to PCP.         Jeryn Cerney K Beckem Tomberlin, NP

## 2024-05-27 NOTE — Telephone Encounter (Signed)
 Inbound call from patient, would like to speak to PJ in regards to motegrity  renewal. She states its coming up in August and she would like to get it started before her medication expires.

## 2024-05-27 NOTE — Telephone Encounter (Signed)
 [employer] called wanted to speak with Dr.G cornering [name]. Caroline [name] can be reach at [phone] Caroline [name] needs clinicals notes too show the pt has Lymphedma.

## 2024-05-28 NOTE — Telephone Encounter (Signed)
 She currently has enough Motegrity  and she will call me to send it end probably second week of July. If it needs a PA we will get our team to help with that.

## 2024-05-28 NOTE — Telephone Encounter (Signed)
 Spoke with Deborrah Fam of [employer] returning her call. States they received faxed order for compression stockings but need OV notes to support order.

## 2024-05-28 NOTE — Telephone Encounter (Addendum)
 I have not diagnosed patient with lymphedema.  She has orthostatic intolerance (I95.1).  I called pt to discuss iron  infusion as well as other symptoms, unable to reach, will try again later.

## 2024-05-28 NOTE — Telephone Encounter (Signed)
 Copied from CRM [id number]. Topic: Clinical - Medical Advice >> May 27, 2024  4:41 PM Shereese L wrote: Reason for EAV:[id number] from [facility] Compression stocking  Clinical notes show that shows the patient has lymphedema and is requesting a call back from office. Also advised to send a fax request over

## 2024-05-28 NOTE — Addendum Note (Signed)
 Addended by: Claire Crick on: 05/28/2024 05:57 PM   Modules accepted: Orders

## 2024-05-28 NOTE — Telephone Encounter (Signed)
 I left her a voice mail message that I will try her back later on today.

## 2024-05-29 ENCOUNTER — Ambulatory Visit

## 2024-05-29 ENCOUNTER — Telehealth: Payer: Self-pay | Admitting: Family Medicine

## 2024-05-29 DIAGNOSIS — R55 Syncope and collapse: Secondary | ICD-10-CM | POA: Diagnosis not present

## 2024-05-29 DIAGNOSIS — R000 Tachycardia, unspecified: Secondary | ICD-10-CM | POA: Diagnosis not present

## 2024-05-29 DIAGNOSIS — I951 Orthostatic hypotension: Secondary | ICD-10-CM

## 2024-05-29 DIAGNOSIS — I479 Paroxysmal tachycardia, unspecified: Secondary | ICD-10-CM

## 2024-05-29 NOTE — Telephone Encounter (Signed)
 Copied from CRM [id number]. Topic: General - Other >> May 29, 2024 12:58 PM Aisha D wrote: Reason for CRM: Pt stated that she would like to speak with Dr.Gutierrez or his nurse regarding the concern for the iron  infusion. Pt stated that she already has a heart monitor and doesn't think her insurance will cover another heart monitor. Pt would like to receive a call back today if possible to address the concerns.

## 2024-05-29 NOTE — Telephone Encounter (Signed)
See subsequent phone note.

## 2024-05-29 NOTE — Telephone Encounter (Signed)
 Spoke with Deborrah Fam of [employer] notifying her of Dr Ocie Belt message concerning lymphedema dx. She verbalizes understanding and will inform pt.

## 2024-05-29 NOTE — Telephone Encounter (Signed)
 Spoke with pt returning her call. Pt states Dr Crissie Dome is not wanting to do iron  infusion until results from [facility]. Says she doesn't think insurance will pay for another monitor. Pt states not getting the iron  infusion now is "just a delay in care" and she doesn't understand what the problem is. Notified her I will fwd her message Dr Crissie Dome. Pt states she will expect a response from him. Fyi to Dr Crissie Dome.

## 2024-05-30 ENCOUNTER — Encounter: Payer: Self-pay | Admitting: Family Medicine

## 2024-05-30 ENCOUNTER — Telehealth: Payer: Self-pay

## 2024-05-30 ENCOUNTER — Ambulatory Visit: Admitting: Family Medicine

## 2024-05-30 NOTE — Telephone Encounter (Signed)
 I wasn't aware she had a heart monitor ordered from [facility]. It has been sent out (yesterday morning) - don't complete the one I ordered, but return it when she receives it.   Regarding iron  infusion, I'm not sure how much this will help but given she has trouble tolerating oral iron , ok to try. I have ordered and pharmacy team should be reaching out to her to schedule at infusion center in [place].

## 2024-05-30 NOTE — Addendum Note (Signed)
 Addended by: Claire Crick on: 05/30/2024 07:06 AM   Modules accepted: Orders

## 2024-05-30 NOTE — Telephone Encounter (Signed)
 Noted. Dr Crissie Dome sent pt a MyChart message with info below (see 05/26/24 pt msg).

## 2024-05-30 NOTE — Telephone Encounter (Signed)
 Dr. Mariam Shingles, patient will be scheduled as soon as possible.  Auth Submission: NO AUTH NEEDED Site of care: Site of care: [facility] Payer: BCBS commercial Medication & CPT/J Code(s) submitted: Venofer (Iron  Sucrose) J1756 Route of submission (phone, fax, portal): phone Phone # [phone] Fax # Auth type: Buy/Bill PB Units/visits requested: 200mg  x 2 doses Reference number: [id number] Approval from: 05/30/24 to 08/30/24

## 2024-06-06 ENCOUNTER — Ambulatory Visit (INDEPENDENT_AMBULATORY_CARE_PROVIDER_SITE_OTHER)

## 2024-06-06 VITALS — BP 113/80 | HR 85 | Temp 98.7°F | Resp 18 | Ht 67.0 in | Wt 165.8 lb

## 2024-06-06 DIAGNOSIS — E611 Iron deficiency: Secondary | ICD-10-CM | POA: Diagnosis not present

## 2024-06-06 MED ORDER — SODIUM CHLORIDE 0.9 % IV BOLUS
250.0000 mL | Freq: Once | INTRAVENOUS | Status: AC
  Administered 2024-06-06: 250 mL via INTRAVENOUS
  Filled 2024-06-06: qty 250

## 2024-06-06 MED ORDER — IRON SUCROSE 20 MG/ML IV SOLN
200.0000 mg | Freq: Once | INTRAVENOUS | Status: AC
  Administered 2024-06-06: 200 mg via INTRAVENOUS
  Filled 2024-06-06: qty 10

## 2024-06-06 NOTE — Progress Notes (Signed)
 Diagnosis: Iron Deficiency Anemia  Provider:  Chilton Greathouse MD  Procedure: IV Push  IV Type: Peripheral, IV Location: R Antecubital  Venofer (Iron Sucrose), Dose: 200 mg  Post Infusion IV Care: Observation period completed and Peripheral IV Discontinued  Discharge: Condition: Good, Destination: Home . AVS Declined  Performed by:  Wyvonne Lenz, RN

## 2024-06-18 DIAGNOSIS — I951 Orthostatic hypotension: Secondary | ICD-10-CM | POA: Diagnosis not present

## 2024-06-18 DIAGNOSIS — M461 Sacroiliitis, not elsewhere classified: Secondary | ICD-10-CM | POA: Diagnosis not present

## 2024-06-18 DIAGNOSIS — R42 Dizziness and giddiness: Secondary | ICD-10-CM | POA: Diagnosis not present

## 2024-06-18 DIAGNOSIS — R000 Tachycardia, unspecified: Secondary | ICD-10-CM | POA: Diagnosis not present

## 2024-06-18 DIAGNOSIS — M5441 Lumbago with sciatica, right side: Secondary | ICD-10-CM | POA: Diagnosis not present

## 2024-06-18 DIAGNOSIS — M5442 Lumbago with sciatica, left side: Secondary | ICD-10-CM | POA: Diagnosis not present

## 2024-06-18 DIAGNOSIS — M9903 Segmental and somatic dysfunction of lumbar region: Secondary | ICD-10-CM | POA: Diagnosis not present

## 2024-06-18 DIAGNOSIS — M9904 Segmental and somatic dysfunction of sacral region: Secondary | ICD-10-CM | POA: Diagnosis not present

## 2024-06-20 ENCOUNTER — Encounter (HOSPITAL_COMMUNITY): Payer: Self-pay | Admitting: Interventional Radiology

## 2024-06-20 ENCOUNTER — Ambulatory Visit (INDEPENDENT_AMBULATORY_CARE_PROVIDER_SITE_OTHER)

## 2024-06-20 VITALS — BP 114/77 | HR 79 | Temp 98.2°F | Resp 16 | Ht 67.0 in | Wt 166.0 lb

## 2024-06-20 DIAGNOSIS — E611 Iron deficiency: Secondary | ICD-10-CM | POA: Diagnosis not present

## 2024-06-20 MED ORDER — SODIUM CHLORIDE 0.9 % IV BOLUS
250.0000 mL | Freq: Once | INTRAVENOUS | Status: AC
  Administered 2024-06-20: 250 mL via INTRAVENOUS
  Filled 2024-06-20: qty 250

## 2024-06-20 MED ORDER — IRON SUCROSE 20 MG/ML IV SOLN
200.0000 mg | Freq: Once | INTRAVENOUS | Status: AC
  Administered 2024-06-20: 200 mg via INTRAVENOUS
  Filled 2024-06-20: qty 10

## 2024-06-20 NOTE — Progress Notes (Signed)
 Diagnosis: Iron Deficiency Anemia  Provider:  Chilton Greathouse MD  Procedure: IV Push  IV Type: Peripheral, IV Location: R Antecubital  Venofer (Iron Sucrose), Dose: 200 mg  Post Infusion IV Care: Observation period completed and Peripheral IV Discontinued  Discharge: Condition: Good, Destination: Home . AVS Declined  Performed by:  Loney Hering, LPN

## 2024-06-23 DIAGNOSIS — M9903 Segmental and somatic dysfunction of lumbar region: Secondary | ICD-10-CM | POA: Diagnosis not present

## 2024-06-23 DIAGNOSIS — M5441 Lumbago with sciatica, right side: Secondary | ICD-10-CM | POA: Diagnosis not present

## 2024-06-23 DIAGNOSIS — M9904 Segmental and somatic dysfunction of sacral region: Secondary | ICD-10-CM | POA: Diagnosis not present

## 2024-06-23 DIAGNOSIS — M5442 Lumbago with sciatica, left side: Secondary | ICD-10-CM | POA: Diagnosis not present

## 2024-06-27 ENCOUNTER — Telehealth: Admitting: Physician Assistant

## 2024-06-27 DIAGNOSIS — R0989 Other specified symptoms and signs involving the circulatory and respiratory systems: Secondary | ICD-10-CM

## 2024-06-27 NOTE — Patient Instructions (Signed)
 Caroline [name] Caroline [name], thank you for joining Caroline [name] Dickinson, PA-C for today's virtual visit.  While this provider is not your primary care provider (PCP), if your PCP is located in our provider database this encounter information will be shared with them immediately following your visit.   A [facility] MyChart account gives you access to today's visit and all your visits, tests, and labs performed at [facility]  click here if you don't have a [facility] MyChart account or go to [email or web address]  Consent: (Patient) Caroline [name] provided verbal consent for this virtual visit at the beginning of the encounter.  Current Medications:  Current Outpatient Medications:    cetirizine (ZYRTEC) 10 MG tablet, Take 10 mg by mouth daily as needed for allergies. (Patient not taking: Reported on 05/27/2024), Disp: , Rfl:    Cholecalciferol  (VITAMIN D3) 25 MCG (1000 UT) CAPS, Take 1 capsule by mouth daily., Disp: , Rfl:    co-enzyme Q-10 30 MG capsule, Take 100 mg by mouth daily. URQuinol. (Patient not taking: Reported on 05/27/2024), Disp: , Rfl:    cyanocobalamin  (VITAMIN B12) 1000 MCG tablet, Take 1,000 mcg by mouth 2 (two) times daily., Disp: , Rfl:    diphenhydrAMINE  (BENADRYL ) 25 mg capsule, Take 50 mg by mouth every 6 (six) hours as needed for allergies or itching. (Patient not taking: Reported on 05/27/2024), Disp: , Rfl:    fexofenadine (ALLEGRA) 180 MG tablet, Take by mouth., Disp: , Rfl:    fluticasone (FLONASE) 50 MCG/ACT nasal spray, Place into both nostrils daily., Disp: , Rfl:    ibuprofen (ADVIL) 200 MG tablet, Take by mouth., Disp: , Rfl:    iron  polysaccharides (FERREX 150) 150 MG capsule, Take 1 capsule (150 mg total) by mouth daily. Take daily for 2 weeks per month (menstrual cycle), every other day the rest of the month, Disp: 80 capsule, Rfl: 3   LORazepam  (ATIVAN ) 0.5 MG tablet, Take 0.5-1 tablets (0.25-0.5 mg total) by mouth at bedtime as needed for anxiety or sleep.,  Disp: 20 tablet, Rfl: 0   metoprolol  tartrate (LOPRESSOR ) 25 MG tablet, Take 0.5 tablet (12.5 mg) by mouth twice daily as needed for tachycardia (Patient not taking: Reported on 05/27/2024), Disp: 60 tablet, Rfl: 3   midodrine (PROAMATINE) 5 MG tablet, Take 5 mg by mouth 3 (three) times daily with meals., Disp: , Rfl:    MOTEGRITY  1 MG TABS, TAKE 1 TABLET DAILY, Disp: 90 tablet, Rfl: 3   Multiple Vitamin (MULTIVITAMIN ADULT) TABS, Take 1 tablet by mouth daily. Nature Made MVI for Her, Disp: , Rfl:    Probiotic Product (PROBIOTIC-10 PO), Take 1 tablet by mouth daily., Disp: , Rfl:    Zinc  50 MG TABS, Take by mouth. Takes 2 times weekly (Patient not taking: Reported on 05/27/2024), Disp: , Rfl:    Medications ordered in this encounter:  No orders of the defined types were placed in this encounter.    *If you need refills on other medications prior to your next appointment, please contact your pharmacy*  Follow-Up: Call back or seek an in-person evaluation if the symptoms worsen or if the condition fails to improve as anticipated.  [facility] [phone]  Other Instructions Abdominal Aortic Aneurysm  An aneurysm is a bulge in an artery. It happens when blood pushes against a weak or damaged artery wall. An abdominal aortic aneurysm (AAA) is an aneurysm in the lower part of the aorta. The aorta is the main artery of the body.  It supplies blood from the heart to the rest of the body. Most aneurysms do not cause symptoms. Some can cause problems. An AAA can cause two serious problems: It can grow and then burst (rupture). It can cause blood to flow between the layers of the wall of the aorta through a tear (aortic dissection). These problems are medical emergencies. They can cause bleeding inside the body. They should be treated right away. What are the causes? The exact cause of this condition is not known. What increases the risk? You may be more likely to develop an AAA  if: You are female and 45 years of age or older. You are Caucasian. You use or have used nicotine or tobacco products. You have a family history of aneurysms. You have an injury or trauma to your aorta. You are obese. You have: Arteriosclerosis. This is when your arteries harden. Arteritis. This is inflammation of the walls of an artery. Certain genetic conditions. Infectious aortitis. This is an infection in the wall of your aorta caused by bacteria. High cholesterol. High blood pressure (hypertension). What are the signs or symptoms? Symptoms depend on how big the aneurysm is and how fast it is growing. Most grow slowly and do not cause symptoms. In some cases, you may have: Severe pain in your abdomen, side, or lower back. A feeling of fullness after you eat only a little bit of food. A throbbing lump in your abdomen. Painful feet or toes. You may also have discolored skin or sores on your feet or toes. Constipation or trouble peeing (urinating). If your AAA bursts, you may: Feel sudden, severe pain in your abdomen, side, or back. Have nausea or vomiting. Feel light-headed or faint. How is this diagnosed? This condition may be diagnosed with a physical exam to check for throbbing and listen to blood flow in your abdomen. You may also have tests, such as: Ultrasound. X-rays. CT scan. MRI. Angiogram. This test checks your arteries for damage or blockage. Because most AAAs that have not burst do not cause symptoms, they are often found during exams for other conditions. How is this treated? Treatment depends on: The size of your aneurysm. How fast your aneurysm is growing. Your age. Risk factors for a burst AAA. If your aneurysm is smaller than 2 inches (5 cm), your health care provider may: Keep an eye on it to see if it gets bigger. You may need to have an ultrasound every 6-12 months, every year, or every few years. Give you medicines to control blood pressure, treat pain,  or fight infection. If your aneurysm is larger than 2 inches (5 cm), you may need surgery. Follow these instructions at home: Eating and drinking  Eat a heart-healthy diet. This includes lots of fresh fruits and vegetables, whole grains, low-fat (lean) protein, and low-fat dairy products. Avoid foods that are high in saturated fat and cholesterol. These include red meat and some dairy products. Lifestyle  Do not use any products that contain nicotine or tobacco. These products include cigarettes, chewing tobacco, and vaping devices, such as e-cigarettes. If you need help quitting, ask your provider. Check your blood pressure often. Follow instructions on how to keep it within normal limits. Have your cholesterol levels checked often. Follow instructions on how to keep levels within normal limits. Stay active. Exercise on a regular basis. Talk with your provider about how often to exercise and which types of exercise are safe for you. Maintain a healthy weight. Alcohol use Do not  drink alcohol if: Your provider tells you not to drink. You are pregnant, may be pregnant, or plan to become pregnant. If you drink alcohol: Limit how much you have to: 0-1 drink a day if you are female. 0-2 drinks a day if you are female. Know how much alcohol is in your drink. In the [place] one drink is one 12 oz bottle of beer (355 mL), one 5 oz glass of wine (148 mL), or one 1 oz glass of hard liquor (44 mL). General instructions Take over-the-counter and prescription medicines only as told by your provider. You may have to avoid lifting. Ask your provider how much you can safely lift. If you can, learn your family's health history. Keep all follow-up visits. Your provider will need to watch the size of your aneurysm and how fast it is growing. Where to find more information American Heart Association: heart.org Contact a health care provider if: You have pain in your abdomen, side, or back. You have a  throbbing mass in your abdomen. Your heart beats fast when you stand. You have nausea or vomiting. You are constipated or have trouble peeing. You have a fever. Get help right away if: You have sudden, severe pain in your abdomen, side, or back. You feel light-headed, or you faint. You have sweaty, clammy skin. You are short of breath. These symptoms may be an emergency. Get help right away. Call 911. Do not wait to see if the symptoms will go away. Do not drive yourself to the hospital. This information is not intended to replace advice given to you by your health care provider. Make sure you discuss any questions you have with your health care provider. Document Revised: 07/18/2022 Document Reviewed: 07/18/2022 Elsevier Patient Education  2024 Elsevier Inc.   If you have been instructed to have an in-person evaluation today at a local Urgent Care facility, please use the link below. It will take you to a list of all of our available [facility], including address, phone number and hours of operation. Please do not delay care.  [facility]  If you or a family member do not have a primary care provider, use the link below to schedule a visit and establish care. When you choose a [facility] primary care physician or advanced practice provider, you gain a long-term partner in health. Find a Primary Care Provider  Learn more about [facility]'s in-office and virtual care options: [facility] - Get Care Now

## 2024-06-27 NOTE — Progress Notes (Signed)
 Virtual Visit Consent   [name], you are scheduled for a virtual visit with a [facility] provider today. Just as with appointments in the office, your consent must be obtained to participate. Your consent will be active for this visit and any virtual visit you may have with one of our providers in the next 365 days. If you have a MyChart account, a copy of this consent can be sent to you electronically.  As this is a virtual visit, video technology does not allow for your provider to perform a traditional examination. This may limit your provider's ability to fully assess your condition. If your provider identifies any concerns that need to be evaluated in person or the need to arrange testing (such as labs, EKG, etc.), we will make arrangements to do so. Although advances in technology are sophisticated, we cannot ensure that it will always work on either your end or our end. If the connection with a video visit is poor, the visit may have to be switched to a telephone visit. With either a video or telephone visit, we are not always able to ensure that we have a secure connection.  By engaging in this virtual visit, you consent to the provision of healthcare and authorize for your insurance to be billed (if applicable) for the services provided during this visit. Depending on your insurance coverage, you may receive a charge related to this service.  I need to obtain your verbal consent now. Are you willing to proceed with your visit today? Caroline [name] has provided verbal consent on 06/27/2024 for a virtual visit (video or telephone). Delon Caroline Dickinson, PA-C  Date: 06/27/2024 7:01 PM   Virtual Visit via Video Note   I, Delon Caroline [name], [name]  ([id number], 02-Dec-1991) on 06/27/24 at  6:15 PM EDT by a video-enabled telemedicine application and verified that I am speaking with the correct person using two identifiers.  Location: Patient: Virtual Visit Location  Patient: Home Provider: Virtual Visit Location Provider: Home Office   I discussed the limitations of evaluation and management by telemedicine and the availability of in person appointments. The patient expressed understanding and agreed to proceed.    History of Present Illness: Caroline [name] is a 33 y.o. who identifies as a female who was assigned female at birth, and is being seen today for pulsation of her abdomen. Started 3 days ago. Gets worse when she has her tachycardia and palpitation episodes with her dysautonomia. Mentions when the pulsations of the abdomen are occurring she is having pain referred into her right leg as well.   Did have 2 iron  infusions recently on 06/06/24 and 06/20/24, unsure if contributory since this started 3 days after the 2nd infusion.  BP 123/80 96 sitting today; HR 84-100 114/75 laying 127/87 standing on 06/26/24; HR 113 138/91 standing after showering evening of 06/26/24  There is family history of aneurysms in her aunt, scheduled to have an AAA stented in 3 weeks.   Personal history of dysautonomia and GI issues.   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   Iron  deficiency 07/19/2022   Allergy  status to other drugs, medicaments and biological substances 06/24/2022   Hypertriglyceridemia 03/02/2022   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   High blood copper  level 10/07/2021   Vitamin B12 deficiency 10/07/2021   Paroxysmal tachycardia (HCC) 05/12/2021    Ovarian cyst 01/07/2021   Insomnia 10/20/2020   Paresthesias 09/25/2020   Vasovagal near-syncope 09/17/2019   Orthostatic intolerance 06/20/2019   Other fatigue 06/26/2018   Seasonal allergic rhinitis    Pineal gland cyst 01/31/2016   White matter abnormality on MRI of brain 01/31/2016   Vitamin D  deficiency 05/30/2010   CONSTIPATION, CHRONIC 05/30/2010   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 Caused rash all over.    Medications:  Current Outpatient Medications:    cetirizine (ZYRTEC) 10 MG tablet, Take 10 mg by mouth daily as needed for allergies. (Patient not taking: Reported on 05/27/2024), Disp: , Rfl:    Cholecalciferol  (VITAMIN D3) 25 MCG (1000 UT) CAPS, Take 1 capsule by mouth daily., Disp: , Rfl:    co-enzyme Q-10 30 MG capsule, Take 100 mg by mouth daily. URQuinol. (Patient not taking: Reported on 05/27/2024), Disp: , Rfl:    cyanocobalamin  (VITAMIN B12) 1000 MCG tablet, Take 1,000 mcg by mouth 2 (two) times daily., Disp: , Rfl:    diphenhydrAMINE  (BENADRYL ) 25 mg capsule, Take 50 mg by mouth every 6 (six) hours as needed for allergies or itching. (Patient not taking: Reported on 05/27/2024), Disp: , Rfl:    fexofenadine (ALLEGRA) 180 MG tablet, Take by mouth., Disp: , Rfl:    fluticasone (FLONASE) 50 MCG/ACT nasal spray, Place into both nostrils daily., Disp: , Rfl:    ibuprofen (ADVIL) 200 MG tablet, Take by mouth., Disp: , Rfl:    iron  polysaccharides (FERREX 150) 150 MG capsule, Take 1 capsule (150 mg total) by mouth daily. Take daily for 2 weeks per month (menstrual cycle), every other day the rest of the month,  Disp: 80 capsule, Rfl: 3   LORazepam  (ATIVAN ) 0.5 MG tablet, Take 0.5-1 tablets (0.25-0.5 mg total) by mouth at bedtime as needed for anxiety or sleep., Disp: 20 tablet, Rfl: 0   metoprolol  tartrate (LOPRESSOR ) 25 MG tablet, Take 0.5 tablet (12.5 mg) by mouth twice daily as needed for tachycardia (Patient not taking: Reported on 05/27/2024), Disp: 60 tablet, Rfl: 3   midodrine (PROAMATINE) 5 MG tablet, Take 5 mg by mouth 3 (three) times daily with meals., Disp: , Rfl:    MOTEGRITY  1 MG TABS, TAKE 1 TABLET DAILY, Disp: 90 tablet, Rfl: 3   Multiple Vitamin (MULTIVITAMIN ADULT) TABS, Take 1 tablet by mouth daily. Nature Made MVI for Her, Disp: , Rfl:    Probiotic Product (PROBIOTIC-10 PO), Take 1 tablet by mouth daily., Disp: , Rfl:    Zinc  50 MG TABS, Take by mouth. Takes 2 times weekly (Patient not taking: Reported on 05/27/2024), Disp: , Rfl:   Observations/Objective: Patient is well-developed, well-nourished in no acute distress.  Resting comfortably  at home.  Head is normocephalic, atraumatic.  No labored breathing.  Speech is clear and coherent with logical content.  Patient is alert and oriented at baseline.  Small, visible pulsations noted of the abdomen just superior to the umbilicus when she places a small flat piece of equipment on the abdomen to visualize better  Assessment and Plan: 1. Abdominal aortic pulsation (Primary)  - Potentially could be benign since she does have a flat stomach where this can be visualized more. However, with only noticing this 3 days ago, having family history, and personal history of dysautonomia/potential ehler danlos (undergoing current evaluation), and having to have iron  infusions, this should be evaluated further - Advised since she is sedentary with her current other comorbidities (dysautonomia) she could monitor and follow up with her PCP on Monday, or her Cardiologist (she does have an appt with them on Thursday, 07/03/24) - If pulsations worsen or  develops abdominal pain, back pain, or symptoms radiating into the legs she should seek immediate evaluation   Follow Up Instructions: I discussed the assessment and treatment plan with the patient. The patient was provided an opportunity to ask questions and all were answered. The patient agreed with the plan and demonstrated an understanding of the instructions.  A copy of instructions were sent to the patient via MyChart unless otherwise noted below.    The patient was advised to call back or seek an in-person evaluation if the symptoms worsen or if the condition fails to improve as anticipated.    Delon Caroline Dickinson, PA-C

## 2024-06-30 ENCOUNTER — Ambulatory Visit: Admitting: Nurse Practitioner

## 2024-06-30 DIAGNOSIS — M9904 Segmental and somatic dysfunction of sacral region: Secondary | ICD-10-CM | POA: Diagnosis not present

## 2024-06-30 DIAGNOSIS — M5442 Lumbago with sciatica, left side: Secondary | ICD-10-CM | POA: Diagnosis not present

## 2024-06-30 DIAGNOSIS — M9903 Segmental and somatic dysfunction of lumbar region: Secondary | ICD-10-CM | POA: Diagnosis not present

## 2024-06-30 DIAGNOSIS — M5441 Lumbago with sciatica, right side: Secondary | ICD-10-CM | POA: Diagnosis not present

## 2024-07-03 DIAGNOSIS — R42 Dizziness and giddiness: Secondary | ICD-10-CM | POA: Diagnosis not present

## 2024-07-03 DIAGNOSIS — I951 Orthostatic hypotension: Secondary | ICD-10-CM | POA: Diagnosis not present

## 2024-07-03 DIAGNOSIS — I471 Supraventricular tachycardia, unspecified: Secondary | ICD-10-CM | POA: Diagnosis not present

## 2024-07-03 DIAGNOSIS — R55 Syncope and collapse: Secondary | ICD-10-CM | POA: Diagnosis not present

## 2024-07-04 ENCOUNTER — Ambulatory Visit: Admitting: Family Medicine

## 2024-07-04 VITALS — BP 110/82 | HR 93 | Temp 98.9°F | Ht 67.0 in | Wt 165.0 lb

## 2024-07-04 DIAGNOSIS — I479 Paroxysmal tachycardia, unspecified: Secondary | ICD-10-CM | POA: Diagnosis not present

## 2024-07-04 DIAGNOSIS — E611 Iron deficiency: Secondary | ICD-10-CM | POA: Diagnosis not present

## 2024-07-04 DIAGNOSIS — K5909 Other constipation: Secondary | ICD-10-CM

## 2024-07-04 DIAGNOSIS — R55 Syncope and collapse: Secondary | ICD-10-CM | POA: Diagnosis not present

## 2024-07-04 DIAGNOSIS — R0989 Other specified symptoms and signs involving the circulatory and respiratory systems: Secondary | ICD-10-CM | POA: Diagnosis not present

## 2024-07-04 DIAGNOSIS — R29898 Other symptoms and signs involving the musculoskeletal system: Secondary | ICD-10-CM

## 2024-07-04 NOTE — Progress Notes (Unsigned)
 Ph: [phone] Fax: [phone]   Patient ID: Caroline [name], female    DOB: March 10, 1991, 33 y.o.   MRN: [id number]  This visit was conducted in person.  BP 110/82   Pulse 93   Temp 98.9 F (37.2 C) (Oral)   Ht 5' 7 (1.702 m)   Wt 165 lb (74.8 kg)   LMP 06/15/2024   SpO2 99%   BMI 25.84 kg/m    No data found.   CC: follow up visit  Subjective:   HPI: Caroline [name] is a 33 y.o. female presenting on 07/04/2024 for GI Problem (C/o nausea and feeling a pulse abd. Also, wants to discuss POC for iron . )   Noticing intermittent nausea with a pulsatile feeling in abdomen - and sees pulse as well.   ?POTS - completed tilt tablet test yesterday through dysautonomia clinic Dr Lorrene - pending results.  Planning to refer to [facility] neurology Dr Erick for small fiber neuropathy.  Tried midodrine 2.5mg  - caused head whooshing and numbness/tingling to head. Felt worsened disorientation - doesn't want to take again.  Grade 2 thigh high compression stockings - tried with benefit but hasn't needed since iron  infusions- see below.  Liberalized salt intake (not using saltsticks anymore).   ?iron  deficiency contribution - she completed 2 Venofer  iron  infusions on 06/06/2024 and 06/20/2024 - significant improvement after first infusion - improved energy levels, more active, able to take standing shower and clean without trouble, twitching of feet improved, improved shortness of breath. Still notes cramping to lower extremities. Significant cognitive improvement (brain fog). After 2nd infusion, she noted less effect of motegrity . Also noted new abdominal pulsing as per above.  Ferrex oral iron  has been on hold since IV iron . Plan was daily x2 wks during menstrual cycle then every other day rest of month.   Lab Results  Component Value Date   IRON  41 05/21/2024   TIBC 360 05/21/2024   FERRITIN 20 05/21/2024   Will want iron  levels checked 1 month after 2nd infusion.   06/23/2024 while walking down  stairs felt to right lower back with radiation down right leg.   Asks about ehler's danlos syndrome - easily rolls ankles and feels joints can pop out of place easily. She will follow up with [facility] about this.      Relevant past medical, surgical, family and social history reviewed and updated as indicated. Interim medical history since our last visit reviewed. Allergies and medications reviewed and updated. Outpatient Medications Prior to Visit  Medication Sig Dispense Refill   cetirizine (ZYRTEC) 10 MG tablet Take 10 mg by mouth daily as needed for allergies.     Cholecalciferol  (VITAMIN D3) 25 MCG (1000 UT) CAPS Take 1 capsule by mouth daily.     co-enzyme Q-10 30 MG capsule Take 100 mg by mouth daily. URQuinol.     cyanocobalamin  (VITAMIN B12) 1000 MCG tablet Take 1,000 mcg by mouth 2 (two) times daily.     diphenhydrAMINE  (BENADRYL ) 25 mg capsule Take 50 mg by mouth every 6 (six) hours as needed for allergies or itching.     fexofenadine (ALLEGRA) 180 MG tablet Take by mouth.     fluticasone (FLONASE) 50 MCG/ACT nasal spray Place into both nostrils daily.     ibuprofen (ADVIL) 200 MG tablet Take by mouth.     iron  polysaccharides (FERREX 150) 150 MG capsule Take 1 capsule (150 mg total) by mouth daily. Take daily for 2 weeks per month (menstrual cycle), every  other day the rest of the month 80 capsule 3   LORazepam  (ATIVAN ) 0.5 MG tablet Take 0.5-1 tablets (0.25-0.5 mg total) by mouth at bedtime as needed for anxiety or sleep. 20 tablet 0   metoprolol  tartrate (LOPRESSOR ) 25 MG tablet Take 0.5 tablet (12.5 mg) by mouth twice daily as needed for tachycardia 60 tablet 3   MOTEGRITY  1 MG TABS TAKE 1 TABLET DAILY 90 tablet 3   Multiple Vitamin (MULTIVITAMIN ADULT) TABS Take 1 tablet by mouth daily. Nature Made MVI for Her     Probiotic Product (PROBIOTIC-10 PO) Take 1 tablet by mouth daily.     midodrine (PROAMATINE) 5 MG tablet Take 5 mg by mouth 3 (three) times daily with meals.     Zinc  50  MG TABS Take by mouth. Takes 2 times weekly (Patient not taking: Reported on 05/27/2024)     No facility-administered medications prior to visit.     Per HPI unless specifically indicated in ROS section below Review of Systems  Objective:  BP 110/82   Pulse 93   Temp 98.9 F (37.2 C) (Oral)   Ht 5' 7 (1.702 m)   Wt 165 lb (74.8 kg)   LMP 06/15/2024   SpO2 99%   BMI 25.84 kg/m   Wt Readings from Last 3 Encounters:  07/04/24 165 lb (74.8 kg)  06/20/24 166 lb (75.3 kg)  06/06/24 165 lb 12.8 oz (75.2 kg)      Physical Exam Vitals and nursing note reviewed.  Constitutional:      Appearance: Normal appearance. She is not ill-appearing.  HENT:     Head: Normocephalic and atraumatic.     Mouth/Throat:     Mouth: Mucous membranes are moist.     Pharynx: Oropharynx is clear. No oropharyngeal exudate or posterior oropharyngeal erythema.  Eyes:     Extraocular Movements: Extraocular movements intact.     Conjunctiva/sclera: Conjunctivae normal.     Pupils: Pupils are equal, round, and reactive to light.  Cardiovascular:     Rate and Rhythm: Normal rate and regular rhythm.     Pulses: Normal pulses.     Heart sounds: Normal heart sounds. No murmur heard. Pulmonary:     Effort: Pulmonary effort is normal. No respiratory distress.     Breath sounds: Normal breath sounds. No wheezing, rhonchi or rales.  Abdominal:     General: Bowel sounds are normal. There is no distension.     Palpations: Abdomen is soft. There is no mass.     Tenderness: There is no abdominal tenderness. There is no guarding or rebound.     Hernia: No hernia is present.  Musculoskeletal:     Right lower leg: No edema.     Left lower leg: No edema.  Skin:    General: Skin is warm and dry.     Findings: No rash.  Neurological:     Mental Status: She is alert.  Psychiatric:        Mood and Affect: Mood normal.        Behavior: Behavior normal.       Results for orders placed or performed in visit on  04/23/24  CBC with Differential/Platelet   Collection Time: 05/21/24  8:55 AM  Result Value Ref Range   WBC 6.5 3.4 - 10.8 x10E3/uL   RBC 4.57 3.77 - 5.28 x10E6/uL   Hemoglobin 13.1 11.1 - 15.9 g/dL   Hematocrit 58.6 65.9 - 46.6 %   MCV 90 79 - 97 fL  MCH 28.7 26.6 - 33.0 pg   MCHC 31.7 31.5 - 35.7 g/dL   RDW 87.1 88.2 - 84.5 %   Platelets 242 150 - 450 x10E3/uL   Neutrophils 53 Not Estab. %   Lymphs 37 Not Estab. %   Monocytes 7 Not Estab. %   Eos 2 Not Estab. %   Basos 1 Not Estab. %   Neutrophils Absolute 3.6 1.4 - 7.0 x10E3/uL   Lymphocytes Absolute 2.4 0.7 - 3.1 x10E3/uL   Monocytes Absolute 0.4 0.1 - 0.9 x10E3/uL   EOS (ABSOLUTE) 0.1 0.0 - 0.4 x10E3/uL   Basophils Absolute 0.0 0.0 - 0.2 x10E3/uL   Immature Granulocytes 0 Not Estab. %   Immature Grans (Abs) 0.0 0.0 - 0.1 x10E3/uL  Iron , TIBC and Ferritin Panel   Collection Time: 05/21/24  8:55 AM  Result Value Ref Range   Total Iron  Binding Capacity 360 250 - 450 ug/dL   UIBC 680 868 - 574 ug/dL   Iron  41 27 - 159 ug/dL   Iron  Saturation 11 (L) 15 - 55 %   Ferritin 20 15 - 150 ng/mL    Assessment & Plan:   Problem List Items Addressed This Visit   None    No orders of the defined types were placed in this encounter.   No orders of the defined types were placed in this encounter.   There are no Patient Instructions on file for this visit.  Follow up plan: No follow-ups on file.  Anton Blas, MD

## 2024-07-04 NOTE — Patient Instructions (Addendum)
 Good to see you today I'm glad the iron  infusion helped some!  We will recheck iron  levels at end of month - labs ordered through [facility].  Return in 3 months for follow up visit.

## 2024-07-05 ENCOUNTER — Encounter: Payer: Self-pay | Admitting: Family Medicine

## 2024-07-05 DIAGNOSIS — R0989 Other specified symptoms and signs involving the circulatory and respiratory systems: Secondary | ICD-10-CM | POA: Insufficient documentation

## 2024-07-05 NOTE — Assessment & Plan Note (Signed)
 Did not tolerate midodrine 2.5mg  dose x1.  To discuss further treatment options with [facility].

## 2024-07-05 NOTE — Assessment & Plan Note (Addendum)
 Mild, anticipate normal variant in patient with thin abdomen, reassurance provided.  She notes maternal aunt (smoker) with h/o abdominal and brain aneurysms but no bruits appreciated on exam today.  She will let me know if any other fmhx aneurysms.  Patient is not a smoker, but has had second hand smoke exposure

## 2024-07-05 NOTE — Assessment & Plan Note (Signed)
 Recently completed tilt table testing this week - pending f/u with [facility] dys[facility]onomia clinic for next month to review next steps.  States missed POTS dx criteria by 1 point in orthostatic pulse change.

## 2024-07-05 NOTE — Assessment & Plan Note (Signed)
 Pending [facility] neurology eval for small fiber neuropathy (Dr Erick).

## 2024-07-05 NOTE — Assessment & Plan Note (Signed)
 H/o colonic inertia, managed on motegrity  through GI.  IV iron  could theoretically worsen constipation but not commonly. Will continue to monitor.

## 2024-07-05 NOTE — Assessment & Plan Note (Addendum)
 Mild based on low % sat and low normal ferritin levels.  MARKED benefit noted after just 1 iron  sucrose infusions, has had 2 total.  Will repeat labs 1 month after 2nd iron  infusions.  Oral iron  is not effective for patient.

## 2024-07-07 ENCOUNTER — Ambulatory Visit: Admitting: Dermatology

## 2024-07-07 ENCOUNTER — Encounter: Payer: Self-pay | Admitting: Dermatology

## 2024-07-07 DIAGNOSIS — Z86018 Personal history of other benign neoplasm: Secondary | ICD-10-CM

## 2024-07-07 DIAGNOSIS — D492 Neoplasm of unspecified behavior of bone, soft tissue, and skin: Secondary | ICD-10-CM

## 2024-07-07 DIAGNOSIS — D225 Melanocytic nevi of trunk: Secondary | ICD-10-CM | POA: Diagnosis not present

## 2024-07-07 DIAGNOSIS — L578 Other skin changes due to chronic exposure to nonionizing radiation: Secondary | ICD-10-CM

## 2024-07-07 DIAGNOSIS — W908XXA Exposure to other nonionizing radiation, initial encounter: Secondary | ICD-10-CM | POA: Diagnosis not present

## 2024-07-07 DIAGNOSIS — Z1283 Encounter for screening for malignant neoplasm of skin: Secondary | ICD-10-CM

## 2024-07-07 DIAGNOSIS — D224 Melanocytic nevi of scalp and neck: Secondary | ICD-10-CM | POA: Diagnosis not present

## 2024-07-07 DIAGNOSIS — Z7189 Other specified counseling: Secondary | ICD-10-CM

## 2024-07-07 DIAGNOSIS — L851 Acquired keratosis [keratoderma] palmaris et plantaris: Secondary | ICD-10-CM

## 2024-07-07 DIAGNOSIS — Z79899 Other long term (current) drug therapy: Secondary | ICD-10-CM

## 2024-07-07 DIAGNOSIS — L719 Rosacea, unspecified: Secondary | ICD-10-CM

## 2024-07-07 DIAGNOSIS — L988 Other specified disorders of the skin and subcutaneous tissue: Secondary | ICD-10-CM

## 2024-07-07 DIAGNOSIS — L814 Other melanin hyperpigmentation: Secondary | ICD-10-CM

## 2024-07-07 DIAGNOSIS — D1801 Hemangioma of skin and subcutaneous tissue: Secondary | ICD-10-CM

## 2024-07-07 DIAGNOSIS — D229 Melanocytic nevi, unspecified: Secondary | ICD-10-CM

## 2024-07-07 DIAGNOSIS — L821 Other seborrheic keratosis: Secondary | ICD-10-CM

## 2024-07-07 MED ORDER — MUPIROCIN 2 % EX OINT: TOPICAL_OINTMENT | CUTANEOUS | 2 refills | Status: DC

## 2024-07-07 MED ORDER — IVERMECTIN 1 % EX CREA: TOPICAL_CREAM | CUTANEOUS | 11 refills | Status: DC

## 2024-07-07 NOTE — Progress Notes (Unsigned)
 Follow-Up Visit   Subjective  Caroline [name] is a 33 y.o. female who presents for the following: Skin Cancer Screening and Full Body Skin Exam. Hx of dysplastic nevus. No personal Hx of skin cancer. New mole at right thigh.   Rosacea. Has used Soolantra  and Rhofade  in the past. Is currently using OTC products.   The patient presents for Total-Body Skin Exam (TBSE) for skin cancer screening and mole check. The patient has spots, moles and lesions to be evaluated, some may be new or changing and the patient may have concern these could be cancer.    The following portions of the chart were reviewed this encounter and updated as appropriate: medications, allergies, medical history  Review of Systems:  No other skin or systemic complaints except as noted in HPI or Assessment and Plan.  Objective  Well appearing patient in no apparent distress; mood and affect are within normal limits.  A full examination was performed including scalp, head, eyes, ears, nose, lips, neck, chest, axillae, abdomen, back, buttocks, bilateral upper extremities, bilateral lower extremities, hands, feet, fingers, toes, fingernails, and toenails. All findings within normal limits unless otherwise noted below.   Relevant physical exam findings are noted in the Assessment and Plan.                    Right posterior base of neck 4 mm med-dark brown macule  left mid back paraspinal at bra line 6 mm med-dark brown irregular macule    Assessment & Plan   SKIN CANCER SCREENING PERFORMED TODAY.  HISTORY OF DYSPLASTIC NEVUS. Left upper abdomen. Mild atypia. 11/19/2019. No evidence of recurrence today Recommend regular full body skin exams Recommend daily broad spectrum sunscreen SPF 30+ to sun-exposed areas, reapply every 2 hours as needed.  Call if any new or changing lesions are noted between office visits   ACTINIC DAMAGE - Chronic condition, secondary to cumulative UV/sun exposure -  diffuse scaly erythematous macules with underlying dyspigmentation - Recommend daily broad spectrum sunscreen SPF 30+ to sun-exposed areas, reapply every 2 hours as needed.  - Staying in the shade or wearing long sleeves, sun glasses (UVA+UVB protection) and wide brim hats (4-inch brim around the entire circumference of the hat) are also recommended for sun protection.  - Call for new or changing lesions.  LENTIGINES, SEBORRHEIC KERATOSES, HEMANGIOMAS - Benign normal skin lesions - Benign-appearing - Call for any changes  MELANOCYTIC NEVI - Tan-brown and/or pink-flesh-colored symmetric macules and papules - Benign appearing on exam today - Observation - Call clinic for new or changing moles - Recommend daily use of broad spectrum spf 30+ sunscreen to sun-exposed areas.    ROSACEA Exam Mid face erythema with telangiectasias   Chronic and persistent condition with duration or expected duration over one year. Condition is symptomatic/ bothersome to patient. Not currently at goal.   Rosacea is a chronic progressive skin condition usually affecting the face of adults, causing redness and/or acne bumps. It is treatable but not curable. It sometimes affects the eyes (ocular rosacea) as well. It may respond to topical and/or systemic medication and can flare with stress, sun exposure, alcohol, exercise, topical steroids (including hydrocortisone /cortisone 10) and some foods.  Daily application of broad spectrum spf 30+ sunscreen to face is recommended to reduce flares.  Patient denies grittiness of the eyes  Treatment Plan Will prescribe [employer] metronidazole/ivermectin /azelaic acid twice daily as needed to affected areas on the face. The patient was advised this is not  covered by insurance since it is made by a Set designer. They will receive an email to check out and the medication will be mailed to their home.       NEOPLASM OF SKIN (2) Right posterior base of  neck Epidermal / dermal shaving  Lesion diameter (cm):  0.4 Informed consent: discussed and consent obtained   Timeout: patient name, date of birth, surgical site, and procedure verified   Procedure prep:  Patient was prepped and draped in usual sterile fashion Prep type:  Isopropyl alcohol Anesthesia: the lesion was anesthetized in a standard fashion   Anesthetic:  1% lidocaine  w/ epinephrine 1-100,000 buffered w/ 8.4% NaHCO3 Instrument used: flexible razor blade   Hemostasis achieved with: pressure, aluminum chloride and electrodesiccation   Outcome: patient tolerated procedure well   Post-procedure details: sterile dressing applied and wound care instructions given   Dressing type: bandage and petrolatum    Specimen 1 - Surgical pathology Differential Diagnosis: Nevus, R/O dysplastic nevus  Check Margins: Yes left mid back paraspinal at bra line Epidermal / dermal shaving  Lesion diameter (cm):  0.6 Informed consent: discussed and consent obtained   Timeout: patient name, date of birth, surgical site, and procedure verified   Procedure prep:  Patient was prepped and draped in usual sterile fashion Prep type:  Isopropyl alcohol Anesthesia: the lesion was anesthetized in a standard fashion   Anesthetic:  1% lidocaine  w/ epinephrine 1-100,000 buffered w/ 8.4% NaHCO3 Instrument used: flexible razor blade   Hemostasis achieved with: pressure, aluminum chloride and electrodesiccation   Outcome: patient tolerated procedure well   Post-procedure details: sterile dressing applied and wound care instructions given   Dressing type: bandage and petrolatum    Specimen 2 - Surgical pathology Differential Diagnosis:  Nevus, R/O dysplastic nevus  Check Margins: Yes Return in about 1 year (around 07/07/2025) for TBSE, HxDN.  I, Jill Parcell, CMA, am acting as scribe for Alm Rhyme, MD.   Documentation: I have reviewed the above documentation for accuracy and completeness, and I  agree with the above.  Alm Rhyme, MD

## 2024-07-07 NOTE — Patient Instructions (Addendum)
 Wound Care Instructions  Cleanse wound gently with soap and water once a day then pat dry with clean gauze. Apply a thin coat of Petrolatum (petroleum jelly, Vaseline) over the wound (unless you have an allergy  to this). We recommend that you use a new, sterile tube of Vaseline. Do not pick or remove scabs. Do not remove the yellow or white healing tissue from the base of the wound.  Cover the wound with fresh, clean, nonstick gauze and secure with paper tape. You may use Band-Aids in place of gauze and tape if the wound is small enough, but would recommend trimming much of the tape off as there is often too much. Sometimes Band-Aids can irritate the skin.  You should call the office for your biopsy report after 1 week if you have not already been contacted.  If you experience any problems, such as abnormal amounts of bleeding, swelling, significant bruising, significant pain, or evidence of infection, please call the office immediately.  FOR ADULT SURGERY PATIENTS: If you need something for pain relief you may take 1 extra strength Tylenol  (acetaminophen ) AND 2 Ibuprofen (200mg  each) together every 4 hours as needed for pain. (do not take these if you are allergic to them or if you have a reason you should not take them.) Typically, you may only need pain medication for 1 to 3 days.     Will prescribe [facility] metronidazole/ivermectin /azelaic acid twice daily as needed to affected areas on the face. The patient was advised this is not covered by insurance since it is made by a compounding pharmacy. They will receive an email to check out and the medication will be mailed to their home.    Instructions for [facility] Medications  One or more of your medications was sent to the [facility] mail order compounding pharmacy. You will receive an email from them and can purchase the medicine through that link. It will then be mailed to your home at the address you confirmed. If for  any reason you do not receive an email from them, please check your spam folder. If you still do not find the email, please let us  know. [facility] phone number is [phone].   Apply Mupirocin  ointment once daily with bandage change.    Recommend daily broad spectrum sunscreen SPF 30+ to sun-exposed areas, reapply every 2 hours as needed. Call for new or changing lesions.  Staying in the shade or wearing long sleeves, sun glasses (UVA+UVB protection) and wide brim hats (4-inch brim around the entire circumference of the hat) are also recommended for sun protection.      Melanoma ABCDEs  Melanoma is the most dangerous type of skin cancer, and is the leading cause of death from skin disease.  You are more likely to develop melanoma if you: Have light-colored skin, light-colored eyes, or red or blond hair Spend a lot of time in the sun Tan regularly, either outdoors or in a tanning bed Have had blistering sunburns, especially during childhood Have a close family member who has had a melanoma Have atypical moles or large birthmarks  Early detection of melanoma is key since treatment is typically straightforward and cure rates are extremely high if we catch it early.   The first sign of melanoma is often a change in a mole or a new dark spot.  The ABCDE system is a way of remembering the signs of melanoma.  A for asymmetry:  The two halves do not match. B for  border:  The edges of the growth are irregular. C for color:  A mixture of colors are present instead of an even brown color. D for diameter:  Melanomas are usually (but not always) greater than 6mm - the size of a pencil eraser. E for evolution:  The spot keeps changing in size, shape, and color.  Please check your skin once per month between visits. You can use a small mirror in front and a large mirror behind you to keep an eye on the back side or your body.   If you see any new or changing lesions before your next  follow-up, please call to schedule a visit.  Please continue daily skin protection including broad spectrum sunscreen SPF 30+ to sun-exposed areas, reapplying every 2 hours as needed when you're outdoors.   Staying in the shade or wearing long sleeves, sun glasses (UVA+UVB protection) and wide brim hats (4-inch brim around the entire circumference of the hat) are also recommended for sun protection.      Due to recent changes in healthcare laws, you may see results of your pathology and/or laboratory studies on MyChart before the doctors have had a chance to review them. We understand that in some cases there may be results that are confusing or concerning to you. Please understand that not all results are received at the same time and often the doctors may need to interpret multiple results in order to provide you with the best plan of care or course of treatment. Therefore, we ask that you please give us  2 business days to thoroughly review all your results before contacting the office for clarification. Should we see a critical lab result, you will be contacted sooner.   If You Need Anything After Your Visit  If you have any questions or concerns for your doctor, please call our main line at [phone] and press option 4 to reach your doctor's medical assistant. If no one answers, please leave a voicemail as directed and we will return your call as soon as possible. Messages left after 4 pm will be answered the following business day.   You may also send us  a message via MyChart. We typically respond to MyChart messages within 1-2 business days.  For prescription refills, please ask your pharmacy to contact our office. Our fax number is [phone].  If you have an urgent issue when the clinic is closed that cannot wait until the next business day, you can page your doctor at the number below.    Please note that while we do our best to be available for urgent issues outside of office  hours, we are not available 24/7.   If you have an urgent issue and are unable to reach us , you may choose to seek medical care at your doctor's office, retail clinic, urgent care center, or emergency room.  If you have a medical emergency, please immediately call 911 or go to the emergency department.  Pager Numbers  - Dr. Hester: [phone]  - Dr. Jackquline: [phone]  - Dr. Claudene: [phone]   In the event of inclement weather, please call our main line at [phone] for an update on the status of any delays or closures.  Dermatology Medication Tips: Please keep the boxes that topical medications come in in order to help keep track of the instructions about where and how to use these. Pharmacies typically print the medication instructions only on the boxes and not directly on the medication tubes.   If  your medication is too expensive, please contact our office at [phone] option 4 or send us  a message through MyChart.   We are unable to tell what your co-pay for medications will be in advance as this is different depending on your insurance coverage. However, we may be able to find a substitute medication at lower cost or fill out paperwork to get insurance to cover a needed medication.   If a prior authorization is required to get your medication covered by your insurance company, please allow us  1-2 business days to complete this process.  Drug prices often vary depending on where the prescription is filled and some pharmacies may offer cheaper prices.  The website www.goodrx.com contains coupons for medications through different pharmacies. The prices here do not account for what the cost may be with help from insurance (it may be cheaper with your insurance), but the website can give you the price if you did not use any insurance.  - You can print the associated coupon and take it with your prescription to the pharmacy.  - You may also stop by our office during  regular business hours and pick up a GoodRx coupon card.  - If you need your prescription sent electronically to a different pharmacy, notify our office through [facility] or by phone at [phone] option 4.     Si Usted Necesita Algo Despus de Su Visita  Tambin puede enviarnos un mensaje a travs de Clinical cytogeneticist. Por lo general respondemos a los mensajes de MyChart en el transcurso de 1 a 2 das hbiles.  Para renovar recetas, por favor pida a su farmacia que se ponga en contacto con nuestra oficina. Randi lakes de fax es [place] [phone].  Si tiene un asunto urgente cuando la clnica est cerrada y que no puede esperar hasta el siguiente da hbil, puede llamar/localizar a su doctor(a) al nmero que aparece a continuacin.   Por favor, tenga en cuenta que aunque hacemos todo lo posible para estar disponibles para asuntos urgentes fuera del horario de [place], no estamos disponibles las 24 horas del da, los 7 [address]  [address] de la [place].   Si tiene un problema urgente y no puede comunicarse con nosotros, puede optar por buscar atencin mdica  en el consultorio de su doctor(a), en una clnica privada, en un centro de atencin urgente o en una sala de emergencias.  Si tiene Engineer, drilling, por favor llame inmediatamente al 911 o vaya a la sala de emergencias.  Nmeros de bper  - Dr. Hester: [phone]  - Dra. Jackquline: [phone]  - Dr. Claudene: [phone]   En caso de inclemencias del tiempo, por favor llame a landry capes principal al [phone] para una actualizacin sobre el [place] de cualquier retraso o cierre.  Consejos para la medicacin en dermatologa: Por favor, guarde las cajas en las que vienen los medicamentos de uso tpico para ayudarle a seguir las instrucciones sobre dnde y cmo usarlos. Las farmacias generalmente imprimen las instrucciones del medicamento slo en las cajas y no directamente en los tubos del [place].   Si su medicamento es muy  caro, por favor, pngase en contacto con landry rieger llamando al [phone] y presione la opcin 4 o envenos un mensaje a travs de Clinical cytogeneticist.   No podemos decirle cul ser su copago por los medicamentos por adelantado ya que esto es diferente dependiendo de la cobertura de su seguro. Sin embargo, es posible que podamos encontrar un medicamento sustituto a Therapist, occupational  formulario para que el seguro cubra el medicamento que se considera necesario.   Si se requiere una autorizacin previa para que su compaa de seguros malta su medicamento, por favor permtanos de 1 a 2 das hbiles para completar este proceso.  Los precios de los medicamentos varan con frecuencia dependiendo del Environmental consultant de dnde se surte la receta y alguna farmacias pueden ofrecer precios ms baratos.  El sitio web www.goodrx.com tiene cupones para medicamentos de Health and safety inspector. Los precios aqu no tienen en cuenta lo que podra costar con la ayuda del seguro (puede ser ms barato con su seguro), pero el sitio web puede darle el precio si no utiliz Tourist information centre manager.  - Puede imprimir el cupn correspondiente y llevarlo con su receta a la farmacia.  - Tambin puede pasar por nuestra oficina durante el horario de atencin regular y Education officer, museum una tarjeta de cupones de GoodRx.  - Si necesita que su receta se enve electrnicamente a una farmacia diferente, informe a nuestra oficina a travs de MyChart de [place] o por telfono llamando al [phone] y presione la opcin 4.

## 2024-07-09 ENCOUNTER — Ambulatory Visit: Payer: Self-pay | Admitting: Dermatology

## 2024-07-09 LAB — SURGICAL PATHOLOGY

## 2024-07-10 ENCOUNTER — Encounter: Payer: Self-pay | Admitting: Dermatology

## 2024-07-10 NOTE — Telephone Encounter (Signed)
-----   Message from Alm Rhyme sent at 07/09/2024  7:09 PM EDT ----- FINAL DIAGNOSIS        1. 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 Skin, left mid back paraspinal at bra line :       DYSPLASTIC COMPOUND NEVUS WITH MODERATE ATYPIA, PERIPHERAL AND DEEP MARGINS       INVOLVED   1&2 - both Moderate Dysplastic Recheck next visit ----- Message ----- From: Interface, Lab In Three [place] Sent: 07/09/2024   5:58 PM EDT To: Alm JAYSON Rhyme, MD

## 2024-07-10 NOTE — Telephone Encounter (Signed)
Patient advised of pathology results 

## 2024-07-10 NOTE — Telephone Encounter (Addendum)
 Tried calling patient regarding results. No answer. Lm for patient to return call.  ----- Message from Alm Rhyme sent at 07/09/2024  7:09 PM EDT ----- FINAL DIAGNOSIS        1. 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 Skin, left mid back paraspinal at bra line :       DYSPLASTIC COMPOUND NEVUS WITH MODERATE ATYPIA, PERIPHERAL AND DEEP MARGINS       INVOLVED   1&2 - both Moderate Dysplastic Recheck next visit ----- Message ----- From: Interface, Lab In Three Zero Seven Sent: 07/09/2024   5:58 PM EDT To: Alm JAYSON Rhyme, MD

## 2024-07-10 NOTE — Telephone Encounter (Signed)
 Left message returning patient's call, she was returning Melissa's call.

## 2024-07-22 DIAGNOSIS — E611 Iron deficiency: Secondary | ICD-10-CM | POA: Diagnosis not present

## 2024-07-23 ENCOUNTER — Ambulatory Visit: Payer: Self-pay | Admitting: Family Medicine

## 2024-07-23 LAB — IRON,TIBC AND FERRITIN PANEL
Ferritin: 87 ng/mL (ref 15–150)
Iron Saturation: 29 % (ref 15–55)
Iron: 81 ug/dL (ref 27–159)
Total Iron Binding Capacity: 279 ug/dL (ref 250–450)
UIBC: 198 ug/dL (ref 131–425)

## 2024-07-23 LAB — CBC WITH DIFFERENTIAL/PLATELET
Basophils Absolute: 0 x10E3/uL (ref 0.0–0.2)
Basos: 0 %
EOS (ABSOLUTE): 0.1 x10E3/uL (ref 0.0–0.4)
Eos: 1 %
Hematocrit: 40 % (ref 34.0–46.6)
Hemoglobin: 12.7 g/dL (ref 11.1–15.9)
Immature Grans (Abs): 0 x10E3/uL (ref 0.0–0.1)
Immature Granulocytes: 0 %
Lymphocytes Absolute: 2.6 x10E3/uL (ref 0.7–3.1)
Lymphs: 38 %
MCH: 28.9 pg (ref 26.6–33.0)
MCHC: 31.8 g/dL (ref 31.5–35.7)
MCV: 91 fL (ref 79–97)
Monocytes Absolute: 0.4 x10E3/uL (ref 0.1–0.9)
Monocytes: 6 %
Neutrophils Absolute: 3.7 x10E3/uL (ref 1.4–7.0)
Neutrophils: 55 %
Platelets: 269 x10E3/uL (ref 150–450)
RBC: 4.4 x10E6/uL (ref 3.77–5.28)
RDW: 12.9 % (ref 11.7–15.4)
WBC: 6.9 x10E3/uL (ref 3.4–10.8)

## 2024-07-25 DIAGNOSIS — M461 Sacroiliitis, not elsewhere classified: Secondary | ICD-10-CM | POA: Diagnosis not present

## 2024-07-25 DIAGNOSIS — M9903 Segmental and somatic dysfunction of lumbar region: Secondary | ICD-10-CM | POA: Diagnosis not present

## 2024-07-25 DIAGNOSIS — M9904 Segmental and somatic dysfunction of sacral region: Secondary | ICD-10-CM | POA: Diagnosis not present

## 2024-07-25 DIAGNOSIS — M5441 Lumbago with sciatica, right side: Secondary | ICD-10-CM | POA: Diagnosis not present

## 2024-08-08 DIAGNOSIS — M9903 Segmental and somatic dysfunction of lumbar region: Secondary | ICD-10-CM | POA: Diagnosis not present

## 2024-08-08 DIAGNOSIS — M5441 Lumbago with sciatica, right side: Secondary | ICD-10-CM | POA: Diagnosis not present

## 2024-08-08 DIAGNOSIS — M9904 Segmental and somatic dysfunction of sacral region: Secondary | ICD-10-CM | POA: Diagnosis not present

## 2024-08-08 DIAGNOSIS — M461 Sacroiliitis, not elsewhere classified: Secondary | ICD-10-CM | POA: Diagnosis not present

## 2024-08-11 DIAGNOSIS — R000 Tachycardia, unspecified: Secondary | ICD-10-CM | POA: Diagnosis not present

## 2024-08-11 DIAGNOSIS — R5381 Other malaise: Secondary | ICD-10-CM | POA: Diagnosis not present

## 2024-08-11 DIAGNOSIS — R888 Abnormal findings in other body fluids and substances: Secondary | ICD-10-CM | POA: Diagnosis not present

## 2024-08-11 DIAGNOSIS — I951 Orthostatic hypotension: Secondary | ICD-10-CM | POA: Diagnosis not present

## 2024-08-22 DIAGNOSIS — M461 Sacroiliitis, not elsewhere classified: Secondary | ICD-10-CM | POA: Diagnosis not present

## 2024-08-22 DIAGNOSIS — M9903 Segmental and somatic dysfunction of lumbar region: Secondary | ICD-10-CM | POA: Diagnosis not present

## 2024-08-22 DIAGNOSIS — M5441 Lumbago with sciatica, right side: Secondary | ICD-10-CM | POA: Diagnosis not present

## 2024-08-22 DIAGNOSIS — M9904 Segmental and somatic dysfunction of sacral region: Secondary | ICD-10-CM | POA: Diagnosis not present

## 2024-09-08 ENCOUNTER — Ambulatory Visit: Payer: Self-pay

## 2024-09-08 DIAGNOSIS — R42 Dizziness and giddiness: Secondary | ICD-10-CM

## 2024-09-08 DIAGNOSIS — E611 Iron deficiency: Secondary | ICD-10-CM

## 2024-09-08 NOTE — Telephone Encounter (Signed)
 FYI Only or Action Required?: Action required by provider: clinical question for provider.  Patient was last seen in primary care on 07/04/2024 by Rilla Baller, MD.  Called Nurse Triage reporting Dizziness.  Symptoms began several months ago.  Interventions attempted: Rest, hydration, or home remedies.  Symptoms are: gradually worsening.  Triage Disposition: Go to ED Now (or PCP Triage)  Patient/caregiver understands and will follow disposition?: No, wishes to speak with PCP     Copied from CRM #[id number]. Topic: Clinical - Red Word Triage >> Sep 08, 2024 12:26 PM Shereese L wrote: [facility] that prompted transfer to Nurse Triage: Iron  low, twitchy feet, feels like shes going pass out, over heating, having issues seating without pain, losing hair Reason for Disposition  SEVERE dizziness (e.g., unable to stand, requires support to walk, feels like passing out now)  Answer Assessment - Initial Assessment Questions Additional info:  1) Iron  infusion in June. Her symptoms improved.  2) She stopped iron  supplements in August due to constipation 3) Refusing ER. She feels she just needs an iron  infusion. She has a pcp appointment already scheduled on 09/10/24 and would like to know if she can come in today for lab work.   1. DESCRIPTION: Describe your dizziness.     lightheaded 2. LIGHTHEADED: Do you feel lightheaded? (e.g., somewhat faint, woozy, weak upon standing)     Heading to syncope 3. VERTIGO: Do you feel like either you or the room is spinning or tilting? (i.e., vertigo)     denies 4. SEVERITY: How bad is it?  Do you feel like you are going to faint? Can you stand and walk?     Feels faint. Laying in bed, feels she will faint if she stands 5. ONSET:  When did the dizziness begin?     intermittent 6. AGGRAVATING FACTORS: Does anything make it worse? (e.g., standing, change in head position)     Standing  7. HEART RATE: Can you tell me your heart rate?  How many beats in 15 seconds?  (Note: Not all patients can do this.)       Heart racing 8. CAUSE: What do you think is causing the dizziness? (e.g., decreased fluids or food, diarrhea, emotional distress, heat exposure, new medicine, sudden standing, vomiting; unknown)     Low iron  9. RECURRENT SYMPTOM: Have you had dizziness before? If Yes, ask: When was the last time? What happened that time?     Yes-ongoing work up.  10. OTHER SYMPTOMS: Do you have any other symptoms? (e.g., fever, chest pain, vomiting, diarrhea, bleeding)       Shortness of breath, racing heart rate, twitching in feet, overheating, clumps of hair loss, pain with sitting.  Protocols used: Dizziness - Lightheadedness-A-AH

## 2024-09-08 NOTE — Addendum Note (Signed)
 Addended by: RILLA BALLER on: 09/08/2024 02:14 PM   Modules accepted: Orders

## 2024-09-08 NOTE — Telephone Encounter (Signed)
 Ok to schedule lab appt at her convenience this week - I have ordered labs

## 2024-09-09 ENCOUNTER — Encounter: Payer: Self-pay | Admitting: Family Medicine

## 2024-09-09 NOTE — Telephone Encounter (Signed)
 Mychart message sent requesting pt call office to make lab appointment.

## 2024-09-09 NOTE — Telephone Encounter (Signed)
 Noted

## 2024-09-10 ENCOUNTER — Ambulatory Visit: Admitting: Family Medicine

## 2024-09-10 ENCOUNTER — Encounter: Payer: Self-pay | Admitting: Family Medicine

## 2024-09-10 VITALS — BP 110/82 | HR 86 | Temp 98.1°F | Ht 67.0 in | Wt 165.0 lb

## 2024-09-10 DIAGNOSIS — E611 Iron deficiency: Secondary | ICD-10-CM

## 2024-09-10 DIAGNOSIS — R42 Dizziness and giddiness: Secondary | ICD-10-CM | POA: Diagnosis not present

## 2024-09-10 DIAGNOSIS — E538 Deficiency of other specified B group vitamins: Secondary | ICD-10-CM | POA: Diagnosis not present

## 2024-09-10 DIAGNOSIS — E559 Vitamin D deficiency, unspecified: Secondary | ICD-10-CM

## 2024-09-10 DIAGNOSIS — I479 Paroxysmal tachycardia, unspecified: Secondary | ICD-10-CM

## 2024-09-10 DIAGNOSIS — K5909 Other constipation: Secondary | ICD-10-CM

## 2024-09-10 NOTE — Patient Instructions (Addendum)
 Labs today Depending on iron  levels we will be in touch with plan for iron .

## 2024-09-10 NOTE — Progress Notes (Unsigned)
 Ph: [phone] Fax: [phone]   Patient ID: Caroline [name], female    DOB: 09-23-1991, 33 y.o.   MRN: [id number]  This visit was conducted in person.  BP 110/82   Pulse 86   Temp 98.1 F (36.7 C) (Oral)   Ht 5' 7 (1.702 m)   Wt 165 lb (74.8 kg)   SpO2 98%   BMI 25.84 kg/m   BP Readings from Last 3 Encounters:  09/10/24 110/82  07/04/24 110/82  06/20/24 114/77    CC: discuss iron   Subjective:   HPI: Caroline [name] is a 33 y.o. female presenting on 09/10/2024 for Medical Management of Chronic Issues (Pt here for Iron )   See prior notes for details.  ?POTS - completed tilt tablet test 06/2024 through dysautonomia clinic Caroline [name].  Tried midodrine 2.5mg  - caused head whooshing and numbness/tingling to head. Felt worsened disorientation - doesn't want to take again.  Saw [facility]ology in f/u last month - planned trial of pyridostigmine (Mestinon) 60mg  TID and referred to PT - has not tried this yet.   Pending [facility] neurology eval Caroline [name] for small fiber neuropathy (11/2024).   Iron  deficiency - she completed 2 Venofer  iron  infusions on 06/06/2024 and 06/20/2024 with significant improvement after first infusion - improved energy levels, more active, able to take standing shower and clean without trouble, twitching of legs improved, improved shortness of breath. Still notes cramping to lower extremities. Significant cognitive improvement (brain fog). After 2nd infusion, she noted less effect of motegrity . Oral iron  has not been effective. She is currently on Niferex (iron  polysaccharide complex) 150mg  - however has not taken many of these due to worsening constipation.   Chronic constipation with colonic inertia followed by GI on prucalopride succinate  (Motegrity ) 1mg  daily - notes decreased effectiveness after 2nd iron  infusion. She is now taking generic prucalopride 1mg . 2mg  dose previously caused excessive bowel movements. Overall bowels are moving fine. Has not needed miralax  since 02/2023.   Over the past month noting new hair loss, heart racing, trouble focusing with blurry vision even when sitting or when laying down. Significant difficulty with any walking. Notes delayed healing.   She is towards end of her menstrual cycle - symptoms significantly worse around cycle. Cycles are not very heavy.   Off OCP since 2024 - due to breakthrough bleeding when she took this.      Relevant past medical, surgical, family and social history reviewed and updated as indicated. Interim medical history since our last visit reviewed. Allergies and medications reviewed and updated. Outpatient Medications Prior to Visit  Medication Sig Dispense Refill   cetirizine (ZYRTEC) 10 MG tablet Take 10 mg by mouth daily as needed for allergies.     Cholecalciferol  (VITAMIN D3) 25 MCG (1000 UT) CAPS Take 1 capsule by mouth daily.     co-enzyme Q-10 30 MG capsule Take 100 mg by mouth daily. URQuinol.     cyanocobalamin  (VITAMIN B12) 1000 MCG tablet Take 1,000 mcg by mouth 2 (two) times daily.     diphenhydrAMINE  (BENADRYL ) 25 mg capsule Take 50 mg by mouth every 6 (six) hours as needed for allergies or itching.     fexofenadine (ALLEGRA) 180 MG tablet Take by mouth.     fluticasone (FLONASE) 50 MCG/ACT nasal spray Place into both nostrils daily.     ibuprofen (ADVIL) 200 MG tablet Take by mouth.     LORazepam  (ATIVAN ) 0.5 MG tablet Take 0.5-1 tablets (0.25-0.5 mg total) by mouth  at bedtime as needed for anxiety or sleep. 20 tablet 0   metoprolol  tartrate (LOPRESSOR ) 25 MG tablet Take 0.5 tablet (12.5 mg) by mouth twice daily as needed for tachycardia 60 tablet 3   MOTEGRITY  1 MG TABS TAKE 1 TABLET DAILY 90 tablet 3   Multiple Vitamin (MULTIVITAMIN ADULT) TABS Take 1 tablet by mouth daily. Nature Made MVI for Her     Prucalopride Succinate  1 MG TABS Take by mouth.     iron  polysaccharides (FERREX 150) 150 MG capsule Take 1 capsule (150 mg total) by mouth daily. Take daily for 2 weeks per  month (menstrual cycle), every other day the rest of the month 80 capsule 3   Ivermectin  1 % CREA Apply twice daily to face as needed for rosacea (Patient not taking: Reported on 09/10/2024) 30 g 11   mupirocin  ointment (BACTROBAN ) 2 % Apply once daily with bandage. (Patient not taking: Reported on 09/10/2024) 22 g 2   Probiotic Product (PROBIOTIC-10 PO) Take 1 tablet by mouth daily. (Patient not taking: Reported on 09/10/2024)     No facility-administered medications prior to visit.     Per HPI unless specifically indicated in ROS section below Review of Systems  Objective:  BP 110/82   Pulse 86   Temp 98.1 F (36.7 C) (Oral)   Ht 5' 7 (1.702 m)   Wt 165 lb (74.8 kg)   SpO2 98%   BMI 25.84 kg/m   Wt Readings from Last 3 Encounters:  09/10/24 165 lb (74.8 kg)  07/04/24 165 lb (74.8 kg)  06/20/24 166 lb (75.3 kg)      Physical Exam Vitals and nursing note reviewed.  Constitutional:      Appearance: Normal appearance. She is not ill-appearing.  HENT:     Head: Normocephalic and atraumatic.     Mouth/Throat:     Mouth: Mucous membranes are moist.     Pharynx: Oropharynx is clear. No oropharyngeal exudate or posterior oropharyngeal erythema.  Eyes:     Extraocular Movements: Extraocular movements intact.     Conjunctiva/sclera: Conjunctivae normal.     Pupils: Pupils are equal, round, and reactive to light.  Neck:     Thyroid : No thyroid  mass or thyromegaly.  Cardiovascular:     Rate and Rhythm: Normal rate and regular rhythm.     Pulses: Normal pulses.     Heart sounds: Normal heart sounds. No murmur heard. Pulmonary:     Effort: Pulmonary effort is normal. No respiratory distress.     Breath sounds: Normal breath sounds. No wheezing, rhonchi or rales.  Abdominal:     General: Bowel sounds are normal. There is no distension.     Palpations: Abdomen is soft. There is no mass.     Tenderness: There is no abdominal tenderness. There is no guarding or rebound.     Hernia:  No hernia is present.  Musculoskeletal:     Cervical back: Normal range of motion and neck supple.     Right lower leg: No edema.     Left lower leg: No edema.  Skin:    General: Skin is warm and dry.     Findings: No rash.  Neurological:     Mental Status: She is alert.  Psychiatric:        Mood and Affect: Mood normal.        Behavior: Behavior normal.       Results for orders placed or performed in visit on 09/10/24  Iron , TIBC  and Ferritin Panel   Collection Time: 09/10/24  8:38 AM  Result Value Ref Range   Total Iron  Binding Capacity 320 250 - 450 ug/dL   UIBC 763 868 - 574 ug/dL   Iron  84 27 - 159 ug/dL   Iron  Saturation 26 15 - 55 %   Ferritin 62 15 - 150 ng/mL  CBC with Differential/Platelet   Collection Time: 09/10/24  8:38 AM  Result Value Ref Range   WBC 7.4 3.4 - 10.8 x10E3/uL   RBC 4.40 3.77 - 5.28 x10E6/uL   Hemoglobin 12.6 11.1 - 15.9 g/dL   Hematocrit 60.3 65.9 - 46.6 %   MCV 90 79 - 97 fL   MCH 28.6 26.6 - 33.0 pg   MCHC 31.8 31.5 - 35.7 g/dL   RDW 87.3 88.2 - 84.5 %   Platelets 282 150 - 450 x10E3/uL   Neutrophils 60 Not Estab. %   Lymphs 33 Not Estab. %   Monocytes 6 Not Estab. %   Eos 1 Not Estab. %   Basos 0 Not Estab. %   Neutrophils Absolute 4.3 1.4 - 7.0 x10E3/uL   Lymphocytes Absolute 2.5 0.7 - 3.1 x10E3/uL   Monocytes Absolute 0.4 0.1 - 0.9 x10E3/uL   EOS (ABSOLUTE) 0.1 0.0 - 0.4 x10E3/uL   Basophils Absolute 0.0 0.0 - 0.2 x10E3/uL   Immature Granulocytes 0 Not Estab. %   Immature Grans (Abs) 0.0 0.0 - 0.1 x10E3/uL  Vitamin B12   Collection Time: 09/10/24  8:38 AM  Result Value Ref Range   Vitamin B-12 944 232 - 1,245 pg/mL  VITAMIN D  25 Hydroxy (Vit-D Deficiency, Fractures)   Collection Time: 09/10/24  8:38 AM  Result Value Ref Range   Vit D, 25-Hydroxy 34.6 30.0 - 100.0 ng/mL  Comprehensive metabolic panel with GFR   Collection Time: 09/10/24  8:38 AM  Result Value Ref Range   Glucose 80 70 - 99 mg/dL   BUN 10 6 - 20 mg/dL    Creatinine, Ser 9.26 0.57 - 1.00 mg/dL   eGFR 887 >40 fO/fpw/8.26   BUN/Creatinine Ratio 14 9 - 23   Sodium 139 134 - 144 mmol/L   Potassium 3.9 3.5 - 5.2 mmol/L   Chloride 102 96 - 106 mmol/L   CO2 23 20 - 29 mmol/L   Calcium 9.6 8.7 - 10.2 mg/dL   Total Protein 6.5 6.0 - 8.5 g/dL   Albumin 4.4 3.9 - 4.9 g/dL   Globulin, Total 2.1 1.5 - 4.5 g/dL   Bilirubin Total 0.4 0.0 - 1.2 mg/dL   Alkaline Phosphatase 80 41 - 116 IU/L   AST 13 0 - 40 IU/L   ALT 6 0 - 32 IU/L    Tilt Table Test Impression (07/03/24): Mildly exaggerated HR response to the up right position with stable blood pressure, not meeting criteria for postural orthostatic tachycardia syndrome Negative carotid massage. Stable BP/HR with valsalva maneuver, deep breathing, and cold pressor testing. Normal postganglionic sympathetic sudomotor axon function by QSWEAT in 3/4 locations. Mildly decreased in the forearm. 50% rise on HR with low dose isuprel No vasovagal syncope   Assessment & Plan:   Problem List Items Addressed This Visit     Vitamin D  deficiency   Update levels on vit D 1000 units daily.       Relevant Orders   VITAMIN D  25 Hydroxy (Vit-D Deficiency, Fractures) (Completed)   CONSTIPATION, CHRONIC   Stable period on 1mg  generic Motegrity  (prucalopride)      Paroxysmal tachycardia (  HCC)   Saw [facility] dys[facility]ia clinic - did not meet criteria for POTS.  Did not tolerate midodrine.  Considering Mestonin trial. Pending PT referral.       Vitamin B12 deficiency - Primary   Update levels on b12 daily replacement.       Relevant Orders   Vitamin B12 (Completed)   Iron  deficiency   Notes recurrence of symptoms prior to iron  infusion 05/2024 - hair loss, racing heart, fatigue, presyncope, dyspnea. Would be interested in repeat iron  infusion - will repeat levels. Symptoms worse around menstrual cycle - ?retrial OCP (previously caused breakthrough bleed).  Oral iron  intolerance, even to Niferex (iron   polysaccharide complex) 150mg  daily.  If normal iron  levels, consider trial liquid iron  (Ferrusol vs Novaferrum PIC).       Dizziness   Ongoing difficult - update labs today.       Relevant Orders   Comprehensive metabolic panel with GFR (Completed)     Meds ordered this encounter  Medications   Polysaccharide Iron  Complex 15 MG/ML LIQD    Sig: Take 4 mLs by mouth daily.    Dispense:  360 mL    Refill:  3    Orders Placed This Encounter  Procedures   Vitamin B12   VITAMIN D  25 Hydroxy (Vit-D Deficiency, Fractures)   Comprehensive metabolic panel with GFR    Patient Instructions  Labs today Depending on iron  levels we will be in touch with plan for iron .   Follow up plan: Return if symptoms worsen or fail to improve.  Anton Blas, MD

## 2024-09-11 ENCOUNTER — Ambulatory Visit: Payer: Self-pay | Admitting: Family Medicine

## 2024-09-11 ENCOUNTER — Encounter: Payer: Self-pay | Admitting: Family Medicine

## 2024-09-11 DIAGNOSIS — D509 Iron deficiency anemia, unspecified: Secondary | ICD-10-CM

## 2024-09-11 LAB — CBC WITH DIFFERENTIAL/PLATELET
Basophils Absolute: 0 x10E3/uL (ref 0.0–0.2)
Basos: 0 %
EOS (ABSOLUTE): 0.1 x10E3/uL (ref 0.0–0.4)
Eos: 1 %
Hematocrit: 39.6 % (ref 34.0–46.6)
Hemoglobin: 12.6 g/dL (ref 11.1–15.9)
Immature Grans (Abs): 0 x10E3/uL (ref 0.0–0.1)
Immature Granulocytes: 0 %
Lymphocytes Absolute: 2.5 x10E3/uL (ref 0.7–3.1)
Lymphs: 33 %
MCH: 28.6 pg (ref 26.6–33.0)
MCHC: 31.8 g/dL (ref 31.5–35.7)
MCV: 90 fL (ref 79–97)
Monocytes Absolute: 0.4 x10E3/uL (ref 0.1–0.9)
Monocytes: 6 %
Neutrophils Absolute: 4.3 x10E3/uL (ref 1.4–7.0)
Neutrophils: 60 %
Platelets: 282 x10E3/uL (ref 150–450)
RBC: 4.4 x10E6/uL (ref 3.77–5.28)
RDW: 12.6 % (ref 11.7–15.4)
WBC: 7.4 x10E3/uL (ref 3.4–10.8)

## 2024-09-11 LAB — COMPREHENSIVE METABOLIC PANEL WITH GFR
ALT: 6 IU/L (ref 0–32)
AST: 13 IU/L (ref 0–40)
Albumin: 4.4 g/dL (ref 3.9–4.9)
Alkaline Phosphatase: 80 IU/L (ref 41–116)
BUN/Creatinine Ratio: 14 (ref 9–23)
BUN: 10 mg/dL (ref 6–20)
Bilirubin Total: 0.4 mg/dL (ref 0.0–1.2)
CO2: 23 mmol/L (ref 20–29)
Calcium: 9.6 mg/dL (ref 8.7–10.2)
Chloride: 102 mmol/L (ref 96–106)
Creatinine, Ser: 0.73 mg/dL (ref 0.57–1.00)
Globulin, Total: 2.1 g/dL (ref 1.5–4.5)
Glucose: 80 mg/dL (ref 70–99)
Potassium: 3.9 mmol/L (ref 3.5–5.2)
Sodium: 139 mmol/L (ref 134–144)
Total Protein: 6.5 g/dL (ref 6.0–8.5)
eGFR: 112 mL/min/1.73 (ref >59)

## 2024-09-11 LAB — VITAMIN D 25 HYDROXY (VIT D DEFICIENCY, FRACTURES): Vit D, 25-Hydroxy: 34.6 ng/mL (ref 30.0–100.0)

## 2024-09-11 LAB — VITAMIN B12: Vitamin B-12: 944 pg/mL (ref 232–1245)

## 2024-09-11 LAB — IRON,TIBC AND FERRITIN PANEL
Ferritin: 62 ng/mL (ref 15–150)
Iron Saturation: 26 % (ref 15–55)
Iron: 84 ug/dL (ref 27–159)
Total Iron Binding Capacity: 320 ug/dL (ref 250–450)
UIBC: 236 ug/dL (ref 131–425)

## 2024-09-11 MED ORDER — POLYSACCHARIDE IRON COMPLEX 15 MG/ML PO LIQD: 4.0000 mL | Freq: Every day | ORAL | 3 refills | Status: DC

## 2024-09-11 NOTE — Assessment & Plan Note (Signed)
 Update levels on b12 daily replacement.

## 2024-09-11 NOTE — Assessment & Plan Note (Addendum)
 Saw [facility] dys[facility]ia clinic - did not meet criteria for POTS.  Did not tolerate midodrine.  Considering Mestonin trial. Pending PT referral.

## 2024-09-11 NOTE — Assessment & Plan Note (Signed)
 Notes recurrence of symptoms prior to iron  infusion 05/2024 - hair loss, racing heart, fatigue, presyncope, dyspnea. Would be interested in repeat iron  infusion - will repeat levels. Symptoms worse around menstrual cycle - ?retrial OCP (previously caused breakthrough bleed).  Oral iron  intolerance, even to Niferex (iron  polysaccharide complex) 150mg  daily.  If normal iron  levels, consider trial liquid iron  (Ferrusol vs Novaferrum PIC).

## 2024-09-11 NOTE — Assessment & Plan Note (Signed)
 Stable period on 1mg  generic Motegrity  (prucalopride)

## 2024-09-11 NOTE — Assessment & Plan Note (Signed)
 Ongoing difficult - update labs today.

## 2024-09-11 NOTE — Assessment & Plan Note (Signed)
 Update levels on vit D 1000 units daily.

## 2024-09-12 ENCOUNTER — Telehealth: Payer: Self-pay

## 2024-09-12 DIAGNOSIS — M5441 Lumbago with sciatica, right side: Secondary | ICD-10-CM | POA: Diagnosis not present

## 2024-09-12 DIAGNOSIS — E611 Iron deficiency: Secondary | ICD-10-CM

## 2024-09-12 DIAGNOSIS — M9903 Segmental and somatic dysfunction of lumbar region: Secondary | ICD-10-CM | POA: Diagnosis not present

## 2024-09-12 DIAGNOSIS — R42 Dizziness and giddiness: Secondary | ICD-10-CM

## 2024-09-12 DIAGNOSIS — M9904 Segmental and somatic dysfunction of sacral region: Secondary | ICD-10-CM | POA: Diagnosis not present

## 2024-09-12 DIAGNOSIS — I951 Orthostatic hypotension: Secondary | ICD-10-CM

## 2024-09-12 DIAGNOSIS — M461 Sacroiliitis, not elsewhere classified: Secondary | ICD-10-CM | POA: Diagnosis not present

## 2024-09-12 NOTE — Telephone Encounter (Signed)
 Copied from CRM [id number]. Topic: Clinical - Medication Question >> Sep 12, 2024 12:17 PM Franky GRADE wrote: Reason for CRM: Patient would like to speak with Dr.Gutierrez or his medical assistant regarding concerns and alternatives to  polysaccharide iron  complex liquid. Patient is available at 1 pm. >> Sep 12, 2024 12:21 PM Franky GRADE wrote: She has concerns with tooth staining & weaken enamel, and if she can take the it with pineapple juice instead of orange juice. She states it's okay to leave a message if she does not answer.

## 2024-09-15 ENCOUNTER — Telehealth: Payer: Self-pay

## 2024-09-15 NOTE — Telephone Encounter (Signed)
 Copied from CRM #[id number]. Topic: Clinical - Medical Advice >> Sep 12, 2024  4:29 PM Harlene ORN wrote: Reason for CRM: Caroline [name] [facility] called on behalf of the pt. They are inquiring if the infusion will require step therapy, because she wants to skip getting medication and go straight to the infusion.   Insurance is requesting the Procedure code.  Phone: [phone]

## 2024-09-16 MED ORDER — NOVAFERRUM 50 50 MG PO CAPS: 1.0000 | ORAL_CAPSULE | ORAL | 1 refills | Status: DC

## 2024-09-16 NOTE — Telephone Encounter (Signed)
 Hematology referral placed to [facility].   I'm not aware of sublingual ferrous sulfate  oral iron .  I don't recommend other sublingual forms of oral iron  as they may not be as well studied as ferrous sulfate  for iron  replacement, and dissolving iron  in mouth may be to irritative for oral mucosa (oral iron  is normally formulated to dissolve further down GI tract).   She could try polysaccharide iron  complex capsules NovaFerrum 50 which is lower dose than what I believe she's previously tried. I sent this in to her pharmacy to try. The polysaccharide iron  complex may be easier on the stomach.

## 2024-09-16 NOTE — Telephone Encounter (Signed)
 Called patient she has not started liquid due to the documented side effects of liquid. Is there an oral alternative that is not liquid or would it be an option to have another infusion set up? Also would like to know what the follow up needs to be to keep check on her iron . She has had increase symptoms with fatigue. She is not able to even walk around house to do normal daily activities.

## 2024-09-16 NOTE — Addendum Note (Signed)
 Addended by: RILLA BALLER on: 09/16/2024 03:45 PM   Modules accepted: Orders

## 2024-09-16 NOTE — Telephone Encounter (Signed)
 Would still suggest she take liquid iron  given her intolerance to even low dose iron  tablets/capsules - ok to take with pineapple juice. To prevent effect on tooth /enamel, do recommend rinsing mouth and/or brushing teeth afterwards.   Would recommend rechecking iron  levels every 2-3 months initially to monitor levels.   Her iron  levels aren't low enough to recommend iron  infusion at this time.  However if she prefers I could refer her to blood doctor in [place] for further evaluation/second opinion. She last saw Dr Babara heme/onc 06/2022.

## 2024-09-16 NOTE — Telephone Encounter (Signed)
 Added to open message not further action needed at this time.

## 2024-09-16 NOTE — Telephone Encounter (Signed)
 Called patient she would like to be seen by same hematologist she has seen in the past. She is ok with referral to [facility]. She wanted to know if the sublingual would be option. She is hesitant to do oral do to issues with teeth.

## 2024-09-17 NOTE — Telephone Encounter (Signed)
 Phone call to patient.  She is aware of new prescription, [facility] has had to order the medication. Patient is aware that hematology referral has been entered.  Will call back next week if she hasn't heard from referral team.  I asked her to watch mychart for  message that includes contact info for the clinic she is being referred to.

## 2024-09-22 ENCOUNTER — Other Ambulatory Visit: Payer: Self-pay | Admitting: Family Medicine

## 2024-09-22 ENCOUNTER — Ambulatory Visit: Payer: Self-pay | Admitting: Family Medicine

## 2024-09-22 NOTE — Telephone Encounter (Signed)
 FYI Only or Action Required?: Action required by provider: update on patient condition.  Patient was last seen in primary care on 09/10/2024 by Rilla Baller, MD.  Called Nurse Triage reporting Palpitations.  Symptoms began several months ago.  Triage Disposition: See HCP Within 4 Hours (Or PCP Triage)  Patient/caregiver understands and will follow disposition?: No, wishes to speak with PCP                  Copied from CRM #[id number]. Topic: Clinical - Red Word Triage >> Sep 22, 2024  9:40 AM Caroline [name] wrote: Red Word that prompted transfer to Nurse Triage: patient called stating her symptoms are getting worse. Patient stated her heart rate Is jumping from 68-168 this morning, not getting best blood flow to her head. She has had all this before and it is the progression of how it goes Reason for Disposition  [1] Heart beating very rapidly (e.g., > 140 / minute) AND [2] NOT present now  (Exception: Only during exercise.)  Answer Assessment - Initial Assessment Questions Pt states she feels like she is not being listened to and getting the care she needs. Pt states she is looking into establishing PCP with [facility]. Pt asked about her hematology referral. This RN let pt know it was placed on 9/23. Pt states she has not heard anything at this time. Pt refuses scheduling an appt with PCP at this time and states I'm not sure what good it would do.    Pt states she started going downhill in Aug Pt states these symptoms went away after iron  infusion in June, now symptoms returned in Aug Pt had a tilt test in July with [facility]; was not diagnosed with POTS; pt referred to neurology with Dec Pt states her HR is always jumping around; symptoms since 2023 Pt states her ferritin has been dropping  Symptoms: Dizziness, seeing black spots Can't sit in a chair for a long time or feel like going to pass out Pt doesn't trust self on stairs Warmth in shower makes vision leave Shaking when  trying to stand  Protocols used: Heart Rate and Heartbeat Questions-A-AH

## 2024-09-22 NOTE — Progress Notes (Signed)
 Ordering venofer  iron  infusion x3.

## 2024-09-22 NOTE — Telephone Encounter (Signed)
 Given symptoms are worsening and she did have relief after first 2 iron  infusions 05/2024, I will order another 3 Venofer  iron  infusions (to complete 5 total this year). I have placed order for this, although her iron  levels were overall ok on latest check. Did she try the NovaFerrum polysaccharide iron  complex? Any better tolerated?   Hematology appt still pending - I anticipate they should be reach out to her shortly to schedule appt.   Reasonable to consolidate care with PCP at [facility] if that's how she would like to proceed.

## 2024-09-23 ENCOUNTER — Encounter: Payer: Self-pay | Admitting: Family Medicine

## 2024-09-23 ENCOUNTER — Telehealth: Payer: Self-pay

## 2024-09-23 DIAGNOSIS — D509 Iron deficiency anemia, unspecified: Secondary | ICD-10-CM | POA: Insufficient documentation

## 2024-09-23 NOTE — Telephone Encounter (Signed)
 Copied from CRM [id number]. Topic: Clinical - Medication Question >> Sep 23, 2024 12:08 PM Dedra B wrote: Reason for CRM: Pt is scheduled to receive iron  infusion and wants to know if receiving 900 mg over 3 weeks will overload her. Pls call pt.

## 2024-09-23 NOTE — Telephone Encounter (Addendum)
 300mg  weekly should be ok (she previously got 200mg  every 2 days x2). 900mg  weekly over 3 weeks is not concerning for too much iron .  However, I am reaching out to infusion team to see if we can schedule 200mg  weekly x2.

## 2024-09-23 NOTE — Telephone Encounter (Signed)
 Dr. Rilla, patient will be scheduled as soon as possible.  Auth Submission: NO AUTH NEEDED Site of care: Site of care: [facility] Payer: [employer] commercial Medication & CPT/J Code(s) submitted: Venofer  (Iron  Sucrose) J1756 Diagnosis Code:  Route of submission (phone, fax, portal): phone Phone # [phone] Fax # Auth type: Buy/Bill PB Units/visits requested: 300mg  x 3 doses Reference number: [id number] Approval from: 09/23/24 to 12/24/24

## 2024-09-23 NOTE — Telephone Encounter (Addendum)
 Spoke with pt relaying Dr Talmadge message and asking his questions. Pt verbalizes understanding. Says she was contacted about iron  infusion but wants to know if she can do same dose/frequency as last infusion (before increasing dose), then test to see if levels are consistently dropping. Also, states she did not start Novaferrum polysaccharide iron  complex. Instead, she restarted iron  supplement 325 mg previously prescribed by Dr KANDICE.   Also, states contacted [facility] and was told they have to review chart 1st to see if they can take her on and if so, won't be seen until 10/2024. Then contacted [place] and told they can't see her before 11/2024.

## 2024-09-23 NOTE — Telephone Encounter (Signed)
 Documented in previously opened phone note.

## 2024-09-23 NOTE — Telephone Encounter (Signed)
 I only had 2 options for Venofer  200mg  MWF x5 or 300mg  weekly x3.  Is there a way to ask for Venofer  200mg  weekly x2?

## 2024-09-24 ENCOUNTER — Telehealth: Payer: Self-pay | Admitting: Oncology

## 2024-09-24 NOTE — Telephone Encounter (Signed)
 Per referral message schedule pt  Next available appt: MD only   I called and left vm for pt to call back to schedule an appt.  Scheduling phone number given

## 2024-09-24 NOTE — Telephone Encounter (Signed)
 Unable to reach patient. Left voicemail to return call to our office.

## 2024-09-24 NOTE — Telephone Encounter (Signed)
 Thank you :)

## 2024-09-25 NOTE — Telephone Encounter (Signed)
 Sending Dr Talmadge message via MyChart.

## 2024-10-01 ENCOUNTER — Ambulatory Visit (INDEPENDENT_AMBULATORY_CARE_PROVIDER_SITE_OTHER)

## 2024-10-01 VITALS — BP 118/84 | HR 77 | Temp 98.0°F | Resp 16 | Ht 67.0 in | Wt 167.0 lb

## 2024-10-01 DIAGNOSIS — E611 Iron deficiency: Secondary | ICD-10-CM | POA: Diagnosis not present

## 2024-10-01 DIAGNOSIS — D509 Iron deficiency anemia, unspecified: Secondary | ICD-10-CM

## 2024-10-01 MED ORDER — SODIUM CHLORIDE 0.9 % IV BOLUS
250.0000 mL | Freq: Once | INTRAVENOUS | Status: AC
  Administered 2024-10-01: 250 mL via INTRAVENOUS
  Filled 2024-10-01: qty 250

## 2024-10-01 MED ORDER — IRON SUCROSE 20 MG/ML IV SOLN
200.0000 mg | Freq: Once | INTRAVENOUS | Status: AC
  Administered 2024-10-01: 200 mg via INTRAVENOUS
  Filled 2024-10-01: qty 10

## 2024-10-01 NOTE — Progress Notes (Signed)
 Diagnosis: Iron  Deficiency Anemia  Provider:  Mannam, Praveen MD  Procedure: IV Push  IV Type: Peripheral, IV Location: R Antecubital  Venofer  (Iron  Sucrose), Dose: 200 mg  Venofer  administered with NS running for patient comfort.  Post Infusion IV Care: Observation period completed and Peripheral IV Discontinued  Discharge: Condition: Stable, Destination: Home . AVS Declined  Performed by:  Rocky FORBES Sar, RN

## 2024-10-02 ENCOUNTER — Other Ambulatory Visit: Payer: Self-pay | Admitting: Family Medicine

## 2024-10-02 ENCOUNTER — Other Ambulatory Visit: Payer: Self-pay | Admitting: Internal Medicine

## 2024-10-02 DIAGNOSIS — G4700 Insomnia, unspecified: Secondary | ICD-10-CM

## 2024-10-03 ENCOUNTER — Telehealth: Payer: Self-pay | Admitting: Internal Medicine

## 2024-10-03 MED ORDER — PRUCALOPRIDE SUCCINATE 1 MG PO TABS: 1.0000 | ORAL_TABLET | Freq: Every day | ORAL | 0 refills | Status: DC

## 2024-10-03 NOTE — Telephone Encounter (Signed)
 Rx for Motegrity  sent in and note attached to call for appointment.

## 2024-10-03 NOTE — Telephone Encounter (Signed)
 Inbound call from patient stating Caroline [name] needs medication Motegrity  refilled. Patient would like it sent to [facility] in [place]. Please advise  Thank you

## 2024-10-03 NOTE — Telephone Encounter (Signed)
 Name of Medication: LORazepam  (ATIVAN ) 0.5 MG tablet  Name of Pharmacy: [facility]  Last Fill or Written Date and Quantity: #20  Last Office Visit and Type: B-12 09/10/24 Next Office Visit and Type: n/a  Last Controlled Substance Agreement Date: n/a  Last UDS: n/a

## 2024-10-06 NOTE — Telephone Encounter (Signed)
 ERx

## 2024-10-08 ENCOUNTER — Ambulatory Visit

## 2024-10-08 VITALS — BP 95/69 | HR 75 | Temp 97.8°F | Resp 16 | Ht 67.0 in | Wt 165.2 lb

## 2024-10-08 DIAGNOSIS — E611 Iron deficiency: Secondary | ICD-10-CM | POA: Diagnosis not present

## 2024-10-08 DIAGNOSIS — D509 Iron deficiency anemia, unspecified: Secondary | ICD-10-CM

## 2024-10-08 MED ORDER — IRON SUCROSE 20 MG/ML IV SOLN
200.0000 mg | Freq: Once | INTRAVENOUS | Status: AC
  Administered 2024-10-08: 200 mg via INTRAVENOUS
  Filled 2024-10-08: qty 10

## 2024-10-08 MED ORDER — SODIUM CHLORIDE 0.9 % IV BOLUS
250.0000 mL | Freq: Once | INTRAVENOUS | Status: AC
  Administered 2024-10-08: 250 mL via INTRAVENOUS
  Filled 2024-10-08: qty 250

## 2024-10-08 NOTE — Progress Notes (Signed)
 Diagnosis: Iron  Deficiency Anemia  Provider:  Praveen Mannam MD  Procedure: IV Push  IV Type: Peripheral, IV Location: R Antecubital  Venofer  (Iron  Sucrose), Dose: 200 mg  Post Infusion IV Care: Observation period completed and Peripheral IV Discontinued  Discharge: Condition: Good, Destination: Home . AVS Declined  Performed by:  Rocky Sar, RN

## 2024-10-17 ENCOUNTER — Ambulatory Visit

## 2024-10-17 DIAGNOSIS — M9903 Segmental and somatic dysfunction of lumbar region: Secondary | ICD-10-CM | POA: Diagnosis not present

## 2024-10-17 DIAGNOSIS — M9904 Segmental and somatic dysfunction of sacral region: Secondary | ICD-10-CM | POA: Diagnosis not present

## 2024-10-17 DIAGNOSIS — M5441 Lumbago with sciatica, right side: Secondary | ICD-10-CM | POA: Diagnosis not present

## 2024-10-17 DIAGNOSIS — M461 Sacroiliitis, not elsewhere classified: Secondary | ICD-10-CM | POA: Diagnosis not present

## 2024-10-23 ENCOUNTER — Ambulatory Visit (INDEPENDENT_AMBULATORY_CARE_PROVIDER_SITE_OTHER)

## 2024-10-23 DIAGNOSIS — Z23 Encounter for immunization: Secondary | ICD-10-CM | POA: Diagnosis not present

## 2024-10-23 NOTE — Progress Notes (Signed)
Flu shot given today. Patient tolerated injection well and VIS sheet given to patient.

## 2024-10-28 DIAGNOSIS — H538 Other visual disturbances: Secondary | ICD-10-CM | POA: Diagnosis not present

## 2024-10-28 DIAGNOSIS — H04123 Dry eye syndrome of bilateral lacrimal glands: Secondary | ICD-10-CM | POA: Diagnosis not present

## 2024-10-28 DIAGNOSIS — H04203 Unspecified epiphora, bilateral lacrimal glands: Secondary | ICD-10-CM | POA: Diagnosis not present

## 2024-10-31 DIAGNOSIS — M9904 Segmental and somatic dysfunction of sacral region: Secondary | ICD-10-CM | POA: Diagnosis not present

## 2024-10-31 DIAGNOSIS — M9903 Segmental and somatic dysfunction of lumbar region: Secondary | ICD-10-CM | POA: Diagnosis not present

## 2024-10-31 DIAGNOSIS — M5441 Lumbago with sciatica, right side: Secondary | ICD-10-CM | POA: Diagnosis not present

## 2024-10-31 DIAGNOSIS — M461 Sacroiliitis, not elsewhere classified: Secondary | ICD-10-CM | POA: Diagnosis not present

## 2024-11-04 DIAGNOSIS — R42 Dizziness and giddiness: Secondary | ICD-10-CM | POA: Diagnosis not present

## 2024-11-04 DIAGNOSIS — R5382 Chronic fatigue, unspecified: Secondary | ICD-10-CM | POA: Diagnosis not present

## 2024-11-04 DIAGNOSIS — E611 Iron deficiency: Secondary | ICD-10-CM | POA: Diagnosis not present

## 2024-11-04 DIAGNOSIS — K5909 Other constipation: Secondary | ICD-10-CM | POA: Diagnosis not present

## 2024-11-05 ENCOUNTER — Telehealth: Payer: Self-pay | Admitting: Family Medicine

## 2024-11-05 NOTE — Telephone Encounter (Signed)
 Copied from CRM [id number]. Topic: General - Other >> Nov 05, 2024 12:07 PM Sasha M wrote: Reason for CRM: Pt has called in today to let Dr KANDICE know that she had a horrible experience at her [facility] appt yesterday. She said she felt completely dismissed and the provider tried to diagnose her with anxiety within the first 5 mins of the appt. She will not be returning to [facility] and would like to keep us  as her primary care and continue to see the specialists as they have been set. If you have questions or concerns OR need her to schedule follow up appt with Dr KANDICE, please reach out to pt.

## 2024-11-14 DIAGNOSIS — N83291 Other ovarian cyst, right side: Secondary | ICD-10-CM | POA: Diagnosis not present

## 2024-11-14 DIAGNOSIS — E611 Iron deficiency: Secondary | ICD-10-CM | POA: Diagnosis not present

## 2024-11-19 ENCOUNTER — Inpatient Hospital Stay: Attending: Oncology | Admitting: Oncology

## 2024-11-19 ENCOUNTER — Encounter: Payer: Self-pay | Admitting: Oncology

## 2024-11-19 ENCOUNTER — Inpatient Hospital Stay

## 2024-11-19 VITALS — BP 114/87 | HR 90 | Temp 97.8°F | Resp 20 | Wt 165.9 lb

## 2024-11-19 DIAGNOSIS — R55 Syncope and collapse: Secondary | ICD-10-CM | POA: Diagnosis not present

## 2024-11-19 DIAGNOSIS — R0602 Shortness of breath: Secondary | ICD-10-CM | POA: Diagnosis not present

## 2024-11-19 DIAGNOSIS — Z862 Personal history of diseases of the blood and blood-forming organs and certain disorders involving the immune mechanism: Secondary | ICD-10-CM | POA: Diagnosis not present

## 2024-11-19 DIAGNOSIS — R000 Tachycardia, unspecified: Secondary | ICD-10-CM | POA: Diagnosis not present

## 2024-11-19 DIAGNOSIS — R5383 Other fatigue: Secondary | ICD-10-CM | POA: Diagnosis not present

## 2024-11-19 DIAGNOSIS — Z800 Family history of malignant neoplasm of digestive organs: Secondary | ICD-10-CM | POA: Insufficient documentation

## 2024-11-19 DIAGNOSIS — R42 Dizziness and giddiness: Secondary | ICD-10-CM | POA: Diagnosis not present

## 2024-11-19 DIAGNOSIS — D509 Iron deficiency anemia, unspecified: Secondary | ICD-10-CM | POA: Diagnosis present

## 2024-11-19 LAB — RETIC PANEL
Immature Retic Fract: 5.8 % (ref 2.3–15.9)
RBC.: 4.67 MIL/uL (ref 3.87–5.11)
Retic Count, Absolute: 82.7 K/uL (ref 19.0–186.0)
Retic Ct Pct: 1.8 % (ref 0.4–3.1)
Reticulocyte Hemoglobin: 33 pg (ref >27.9)

## 2024-11-19 LAB — IRON AND TIBC
Iron: 91 ug/dL (ref 28–170)
Saturation Ratios: 26 % (ref 10.4–31.8)
TIBC: 351 ug/dL (ref 250–450)
UIBC: 261 ug/dL

## 2024-11-19 LAB — CBC WITH DIFFERENTIAL (CANCER CENTER ONLY)
Abs Immature Granulocytes: 0.03 K/uL (ref 0.00–0.07)
Basophils Absolute: 0 K/uL (ref 0.0–0.1)
Basophils Relative: 0 %
Eosinophils Absolute: 0.1 K/uL (ref 0.0–0.5)
Eosinophils Relative: 1 %
HCT: 39.6 % (ref 36.0–46.0)
Hemoglobin: 13.4 g/dL (ref 12.0–15.0)
Immature Granulocytes: 0 %
Lymphocytes Relative: 27 %
Lymphs Abs: 1.9 K/uL (ref 0.7–4.0)
MCH: 28.8 pg (ref 26.0–34.0)
MCHC: 33.8 g/dL (ref 30.0–36.0)
MCV: 85.2 fL (ref 80.0–100.0)
Monocytes Absolute: 0.5 K/uL (ref 0.1–1.0)
Monocytes Relative: 7 %
Neutro Abs: 4.8 K/uL (ref 1.7–7.7)
Neutrophils Relative %: 65 %
Platelet Count: 263 K/uL (ref 150–400)
RBC: 4.65 MIL/uL (ref 3.87–5.11)
RDW: 12.7 % (ref 11.5–15.5)
WBC Count: 7.3 K/uL (ref 4.0–10.5)
nRBC: 0 % (ref 0.0–0.2)

## 2024-11-19 LAB — FOLATE: Folate: >20 ng/mL (ref >5.9)

## 2024-11-19 LAB — FERRITIN: Ferritin: 138 ng/mL (ref 11–307)

## 2024-11-19 NOTE — Assessment & Plan Note (Signed)
 Recent iron  levels and CBC results were reviewed and discussed with patient. History of iron  deficiency anemia, completely resolved. Her blood work in April and May 2025 showed mild iron  deficiency without anemia. She received 2 doses of Venofer  treatments.  July 2025 iron  level completely normalized. Despite that, patient continued to have symptoms, suggesting other causes She received additional Venofer  treatments in October 2025. Lab Results  Component Value Date   HGB 13.4 11/19/2024   TIBC 351 11/19/2024   IRONPCTSAT 26 11/19/2024   FERRITIN 138 11/19/2024    Currently her hemoglobin is completely normal, iron  panel parameters are all within normal limits.  Not consistent with iron  deficiency.  No benefit of additional IV Venofer  treatments currently.   Continue monitor and repeat iron  level in 3 months. Etiology of previous mild iron  deficiency-possible blood loss from menstrual period, versus GI blood loss versus malabsorption. Discussed about GI referral and the patient agrees.

## 2024-11-19 NOTE — Assessment & Plan Note (Signed)
 Refer to genetic counseling.

## 2024-11-19 NOTE — Assessment & Plan Note (Signed)
 Fatigue, dizziness, shortness of breath, symptoms are not proportional to her hemoglobin level and iron  level.  Suggesting other causes. Her previous workup showed normal vitamin D  level, normal thyroid  function, normal B12 levels. Recommend patient continue follow-up with primary care provider, and other specialist team for further evaluation.

## 2024-11-19 NOTE — Progress Notes (Signed)
 Hematology/Oncology Progress note [phone] [phone]            Patient Care Team: Rilla Baller, MD as PCP - General (Family Medicine) Perla Evalene PARAS, MD as PCP - Cardiology (Cardiology) Babara Call, MD as Consulting Physician (Oncology)  REFERRING PROVIDER: Rilla Baller, MD   CHIEF COMPLAINTS/REASON FOR VISIT:  Dizziness, fatigue, low iron  level.   ASSESSMENT & PLAN:   History of iron  deficiency anemia Recent iron  levels and CBC results were reviewed and discussed with patient. History of iron  deficiency anemia, completely resolved. Her blood work in April and May 2025 showed mild iron  deficiency without anemia. She received 2 doses of Venofer  treatments.  July 2025 iron  level completely normalized. Despite that, patient continued to have symptoms, suggesting other causes She received additional Venofer  treatments in October 2025. Lab Results  Component Value Date   HGB 13.4 11/19/2024   TIBC 351 11/19/2024   IRONPCTSAT 26 11/19/2024   FERRITIN 138 11/19/2024    Currently her hemoglobin is completely normal, iron  panel parameters are all within normal limits.  Not consistent with iron  deficiency.  No benefit of additional IV Venofer  treatments currently.   Continue monitor and repeat iron  level in 3 months. Etiology of previous mild iron  deficiency-possible blood loss from menstrual period, versus GI blood loss versus malabsorption. Discussed about GI referral and the patient agrees.   Other fatigue Fatigue, dizziness, shortness of breath, symptoms are not proportional to her hemoglobin level and iron  level.  Suggesting other causes. Her previous workup showed normal vitamin D  level, normal thyroid  function, normal B12 levels. Recommend patient continue follow-up with primary care provider, and other specialist team for further evaluation.  Family history of colon cancer Refer to genetic counseling  Orders Placed This Encounter   Procedures   CBC with Differential (Cancer Center Only)    Standing Status:   Future    Number of Occurrences:   1    Expected Date:   11/19/2024    Expiration Date:   02/17/2025   Iron  and TIBC    Standing Status:   Future    Number of Occurrences:   1    Expected Date:   11/19/2024    Expiration Date:   02/17/2025   Ferritin    Standing Status:   Future    Number of Occurrences:   1    Expected Date:   11/19/2024    Expiration Date:   02/17/2025   Retic Panel    Standing Status:   Future    Number of Occurrences:   1    Expected Date:   11/19/2024    Expiration Date:   02/17/2025   Folate    Standing Status:   Future    Number of Occurrences:   1    Expected Date:   11/19/2024    Expiration Date:   02/17/2025   CBC with Differential (Cancer Center Only)    Standing Status:   Future    Expected Date:   02/19/2025    Expiration Date:   05/20/2025   Iron  and TIBC    Standing Status:   Future    Expected Date:   02/19/2025    Expiration Date:   05/20/2025   Ferritin    Standing Status:   Future    Expected Date:   02/19/2025    Expiration Date:   05/20/2025   Retic Panel    Standing Status:   Future    Expected Date:  02/19/2025    Expiration Date:   05/20/2025   Ambulatory referral to Genetics    Referral Priority:   Routine    Referral Type:   Consultation    Referral Reason:   Specialty Services Required    Number of Visits Requested:   1   Ambulatory referral to Gastroenterology    Referral Priority:   Routine    Referral Type:   Consultation    Referral Reason:   Specialty Services Required    Referred to Provider:   Avram Lupita BRAVO, MD    Number of Visits Requested:   1   Follow-up in 3 months. All questions were answered. The patient knows to call the clinic with any problems, questions or concerns.  Caroline Cap, MD, PhD [facility] Hematology Oncology 11/19/2024   HISTORY OF PRESENTING ILLNESS:   Caroline [name] is a  33 y.o.  female with PMH listed below was seen  in consultation at the request of  Rilla Baller, MD  for evaluation of increased copper  level, low iron  level.  Patient has elevated copper  level in the low 200s.  Unclear etiology.  Patient drinks city water.  Reports have had home water test which was within normal results. 10/31/2021 ceruloplasmin level 52.6.  Slightly elevated. 11/01/2021, urine copper  level normal.  06/29/2022, iron  level showed increased TIBC 473, ferritin 26, TIBC 473.  Patient reports history of heavy menstrual period in the past, menorrhagia has improved and now.  Denies any black stool or blood in the stool.  Denies any dietary restrictions.   She has taken oral iron  supplementation.  Ferrex 150 mg 3 times per week.  She also takes zinc  50 mg daily. She has chronic constipation secondary to chronic inertia.  She follows up with [facility] colonoscopy. Reports sweating a lot, thyroid  function was checked earlier this year before within normal limits.   INTERVAL HISTORY Caroline [name] is a 33 y.o. female who has above history reviewed by me today presents for follow up visit for fatigue, dizziness low iron  level, and multiple other complaints.  Discussed the use of AI scribe software for clinical note transcription with the patient, who gave verbal consent to proceed.   Patient was previously seen by me in 2023 for increased copper  level and iron  deficiency anemia.  Copper  level has normalized.  Patient presents to establish care.  Since September 2023, following a second COVID-19 infection, she has experienced significant health issues, including episodes of shakiness, heart rate spikes, and vision loss, which have impaired her ability to drive. She reports that her symptoms have continued despite seeing a number of different specialists.  She has been diagnosed with vestibular dysfunction on the left side and possible dysautonomia, with heart rate spikes exceeding 180 beats per minute and fluctuating blood pressure. These  episodes occur more frequently than she would like.  Patient was seen by dysautonomia clinic.  Did not tolerate midodrine.  She reports symptoms such as high heart rate, sweating, hot flashes, and syncope. She attempted to drive recently but had to stop due to unsteady vision. Walking around the house has become more challenging.  She reports overheating and increased sweating. She worse shortness of breath which is debilitating. She experiences 'bad shakes' and muscle weakness, particularly when her ferritin is low. She describes 'twitchy feet' and painful muscle spasms in her toes and ankles, which she refers to as 'crow feet.' Her heart rate increases significantly with minimal exertion.She works from home and notes that  sitting in a chair causes her heart rate to increase and vision to darken, leading to near syncope.  Oral iron  supplements, which were effective previously, have not been successful this time. These symptoms improve with iron  infusions.  she has received iron  infusions given by primary care provider.   I reviewed her recent iron  levels.   04/17/2024, iron  saturation 14, TIBC 345. 05/21/2024, iron  saturation 11, ferritin 20.  TIBC 360. 07/22/2024, iron  saturation 29, ferritin 87.  TIBC 379. 09/10/2024, ferritin 62, iron  saturation 26 TIBC 320. she received Venofer  200 mg on 06/06/2024, 06/20/2024, 10/01/2024, 10/08/2024.    She started Motegrity  in 2023 or 2024, which has resolved her colonic inertia, allowing her to have regular bowel movements.   She has a family history of colorectal cancer, with her father diagnosed at age 19 and passing away at 15. She has not had a colonoscopy since 2010 and expresses concern about potential genetic predispositions.  She has seen cardiology for syncope and tachycardia. She is scheduled to see a neurologist on 12/08/2024 for evaluation of possible small fiber neuropathy.      Review of Systems  Constitutional:  Positive for fatigue.  Negative for appetite change, chills and fever.  HENT:   Negative for hearing loss and voice change.   Eyes:  Negative for eye problems.  Respiratory:  Positive for shortness of breath. Negative for chest tightness and cough.   Cardiovascular:  Negative for chest pain.  Gastrointestinal:  Negative for abdominal distention, abdominal pain and blood in stool.  Endocrine: Negative for hot flashes.  Genitourinary:  Negative for difficulty urinating and frequency.   Musculoskeletal:  Positive for gait problem. Negative for arthralgias.  Skin:  Negative for itching and rash.  Neurological:  Positive for dizziness, gait problem and light-headedness. Negative for extremity weakness.       Abnormal sensation, twitchy feet  Hematological:  Negative for adenopathy.  Psychiatric/Behavioral:  Negative for confusion.     MEDICAL HISTORY:  Past Medical History:  Diagnosis Date   Allergy     Anemia    Not sure, ferritin was low before   Chronic fatigue 06/26/2018   Colonic inertia 2007   Dr Joline ([facility])   CONSTIPATION, CHRONIC 05/30/2010   H/o colonic inertia   COVID-19 virus infection 10/24/2022   Dysplastic nevus 11/19/2019   Left upper abdomen. Mild atypia.   Dysplastic nevus 07/07/2024   right posterior base of neck - moderate   Dysplastic nevus 07/07/2024   left mid back paraspinal at braline moderate   High blood copper  level 10/07/2021   Unclear cause.   She had home water tested with normal results.   Ceruloplasmin when checked was not low.   24 hour urine copper  levels were normal.   Managing with zinc  and low copper  diet.   Saw heme - ?GI issue, planned GI f/u.    History of chicken pox    Hives 08/16/2021   Urticaria to OxyCide cleaner   Orthostatic intolerance 06/20/2019   Paroxysmal tachycardia (HCC) 05/12/2021   S/p cardiac eval (Dr Jordan, Dr Perla)   Seasonal allergic rhinitis    Seizures (HCC)    A long time ago. None since 2007   White matter abnormality on MRI  of brain 01/31/2016   Small T2/FLAIR hyperintense focus in the deep white matter of the right frontal lobe, unchanged in appearance when compared to the 12/14/2015 MRI. This is a nonspecific finding and is unlikely to be of clinical significance in isolation.  2mm (11/2016)    SURGICAL HISTORY: Past Surgical History:  Procedure Laterality Date   COLONOSCOPY  2010   WNL [place])   EXCISION MORTON'S NEUROMA Left 2007   IR 3D INDEPENDENT WKST  08/03/2023   IR ANGIO INTRA EXTRACRAN SEL COM CAROTID INNOMINATE BILAT MOD SED  08/03/2023   IR ANGIO VERTEBRAL SEL VERTEBRAL BILAT MOD SED  08/03/2023   IR RADIOLOGIST EVAL & MGMT  07/02/2023   IR RADIOLOGIST EVAL & MGMT  09/10/2023   IR RADIOLOGIST EVAL & MGMT  10/04/2023   IR US  GUIDE VASC ACCESS RIGHT  08/03/2023   WISDOM TOOTH EXTRACTION  2010    SOCIAL HISTORY: Social History   Socioeconomic History   Marital status: Single    Spouse name: Not on file   Number of children: 0   Years of education: 20   Highest education level: Bachelor's degree (e.g., BA, AB, BS)  Occupational History   Occupation: IT   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   Occupation: Primary School Teacher: [employer]  Tobacco Use   Smoking status: Never   Smokeless tobacco: Never  Vaping Use   Vaping status: Never Used  Substance and Sexual Activity   Alcohol use: Yes    Comment: on holidays   Drug use: No   Sexual activity: Yes    Birth control/protection: None  Other Topics Concern   Not on file  Social History Narrative   Caffeine use: 1 cup coffee/day and some tea   Lives at home with mother, father, and sister and pets (horses, ducks, programme researcher, broadcasting/film/video, fish, wild turkey)   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, 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   Edu: Environmental Manager and Market Researcher   Activity: Walking   Diet: good water, fruits/vegetables daily      Social Drivers of Corporate Investment Banker Strain: Low Risk  (11/04/2024)   Received from [facility]   Overall Financial Resource Strain (CARDIA)    Difficulty of Paying Living Expenses: Not hard at all  Food Insecurity: No Food Insecurity (11/04/2024)   [facility]   Hunger Vital Sign    Within the past 12 months, you worried that your food would run out before you got the money to buy more.: Never true    Within the past 12 months, the food you bought just didn't last and you didn't have money to get more.: Never true  Transportation Needs: Patient Declined (11/04/2024)   Received from [facility]   PRAPARE - Transportation    Lack of Transportation (Non-Medical): Patient declined    In the past 12 months, has lack of transportation kept you from medical appointments or from getting medications?: Patient declined  Physical Activity: Inactive (07/04/2024)   Exercise Vital Sign    Days of Exercise per Week: 0 days    Minutes of Exercise per Session: Not on file  Stress: No Stress Concern Present (07/04/2024)   [employer] of Occupational Health - Occupational Stress Questionnaire    Feeling of Stress: Not at all  Social Connections: Socially Isolated (07/04/2024)   Social Connection and Isolation Panel    Frequency of Communication with Friends and Family: More than three times a week    Frequency of Social Gatherings with Friends and Family: More than three times a week    Attends Religious Services: Never    Database Administrator or Organizations: No  Attends Engineer, Structural: Not on file    Marital Status: Never married  Intimate Partner Violence: Not on file    FAMILY HISTORY: Family History  Problem Relation Age of Onset   Hypothyroidism Mother    Hypertension Mother    Heart disease Mother    Miscarriages / Stillbirths Mother    Liver disease Father    Colon cancer Father    Hypertension Father    Cancer Father        pancreas   Cirrhosis Father    COPD Father    Atrial fibrillation  Father    Diabetes Father    Alcohol abuse Father    Early death Father    Heart disease Father    Hypertension Maternal Grandmother    Heart failure Maternal Grandmother    Dementia Maternal Grandmother    Stroke Maternal Grandmother    Arthritis Maternal Grandmother    Heart disease Maternal Grandmother    Hypertension Paternal Grandmother    Cancer Paternal Grandmother        rare colon cancer   Colon cancer Paternal Aunt    Stroke Maternal Aunt    Cerebral aneurysm Maternal Aunt    AAA (abdominal aortic aneurysm) Maternal Aunt        smoker    ALLERGIES:  is allergic to amoxicillin , doxycycline, linzess [linaclotide], neomycin, nickel, scopolamine, and other.  MEDICATIONS:  Current Outpatient Medications  Medication Sig Dispense Refill   azelastine (ASTELIN) 0.1 % nasal spray Spray 2 sprays twice a day by intranasal route.     cetirizine (ZYRTEC) 10 MG tablet Take 10 mg by mouth daily as needed for allergies.     Cholecalciferol  (VITAMIN D3) 25 MCG (1000 UT) CAPS Take 1 capsule by mouth daily.     diphenhydrAMINE  (BENADRYL ) 25 mg capsule Take 50 mg by mouth every 6 (six) hours as needed for allergies or itching.     fexofenadine (ALLEGRA) 180 MG tablet Take by mouth.     fluticasone (FLONASE) 50 MCG/ACT nasal spray Place into both nostrils daily.     ibuprofen (ADVIL) 200 MG tablet Take by mouth.     LORazepam  (ATIVAN ) 0.5 MG tablet TAKE 1/2 TO 1 TABLET(0.25 TO 0.5 MG) BY MOUTH AT BEDTIME AS NEEDED FOR ANXIETY OR SLEEP 20 tablet 0   Multiple Vitamin (MULTIVITAMIN ADULT) TABS Take 1 tablet by mouth daily. Nature Made MVI for Her     propranolol  (INDERAL ) 10 MG tablet      Prucalopride Succinate  (MOTEGRITY ) 1 MG TABS Take 1 tablet (1 mg total) by mouth daily. 90 tablet 0   Prucalopride Succinate  1 MG TABS Take by mouth.     co-enzyme Q-10 30 MG capsule Take 100 mg by mouth daily. URQuinol. (Patient not taking: Reported on 11/19/2024)     cyanocobalamin  (VITAMIN B12) 1000 MCG  tablet Take 1,000 mcg by mouth 2 (two) times daily. (Patient not taking: Reported on 11/19/2024)     Ivermectin  1 % CREA Apply twice daily to face as needed for rosacea (Patient not taking: Reported on 11/19/2024) 30 g 11   metoprolol  tartrate (LOPRESSOR ) 25 MG tablet Take 0.5 tablet (12.5 mg) by mouth twice daily as needed for tachycardia (Patient not taking: Reported on 11/19/2024) 60 tablet 3   mupirocin  ointment (BACTROBAN ) 2 % Apply once daily with bandage. (Patient not taking: Reported on 11/19/2024) 22 g 2   Polysaccharide Iron  Complex (NOVAFERRUM 50) 50 MG CAPS Take 1 capsule by mouth every other day. (  Patient not taking: Reported on 11/19/2024) 90 capsule 1   Polysaccharide Iron  Complex 15 MG/ML LIQD Take 4 mLs by mouth daily. (Patient not taking: Reported on 11/19/2024) 360 mL 3   Probiotic Product (PROBIOTIC-10 PO) Take 1 tablet by mouth daily. (Patient not taking: Reported on 11/19/2024)     No current facility-administered medications for this visit.     PHYSICAL EXAMINATION:  Vitals:   11/19/24 1311  BP: 114/87  Pulse: 90  Resp: 20  Temp: 97.8 F (36.6 C)  SpO2: 100%   Filed Weights   11/19/24 1311  Weight: 165 lb 14.4 oz (75.3 kg)    Physical Exam Constitutional:      General: She is not in acute distress. HENT:     Head: Normocephalic and atraumatic.  Eyes:     General: No scleral icterus. Cardiovascular:     Rate and Rhythm: Normal rate and regular rhythm.     Heart sounds: Normal heart sounds.  Pulmonary:     Effort: Pulmonary effort is normal. No respiratory distress.     Breath sounds: Normal breath sounds. No wheezing.  Abdominal:     General: Bowel sounds are normal. There is no distension.     Palpations: Abdomen is soft.  Musculoskeletal:        General: No deformity. Normal range of motion.     Cervical back: Normal range of motion and neck supple.     Right lower leg: No edema.     Left lower leg: No edema.  Skin:    Findings: No erythema or  rash.  Neurological:     Mental Status: She is alert and oriented to person, place, and time. Mental status is at baseline.  Psychiatric:        Mood and Affect: Mood normal.     LABORATORY DATA:  I have reviewed the data as listed    Latest Ref Rng & Units 11/19/2024    2:14 PM 09/10/2024    8:38 AM 07/22/2024    8:46 AM  CBC  WBC 4.0 - 10.5 K/uL 7.3  7.4  6.9   Hemoglobin 12.0 - 15.0 g/dL 86.5  87.3  87.2   Hematocrit 36.0 - 46.0 % 39.6  39.6  40.0   Platelets 150 - 400 K/uL 263  282  269       Latest Ref Rng & Units 09/10/2024    8:38 AM 01/18/2024    8:48 AM 09/14/2023    8:26 AM  CMP  Glucose 70 - 99 mg/dL 80  87  77   BUN 6 - 20 mg/dL 10  9  8    Creatinine 0.57 - 1.00 mg/dL 9.26  9.21  9.32   Sodium 134 - 144 mmol/L 139  141  140   Potassium 3.5 - 5.2 mmol/L 3.9  4.3  4.2   Chloride 96 - 106 mmol/L 102  102  103   CO2 20 - 29 mmol/L 23  26  22    Calcium 8.7 - 10.2 mg/dL 9.6  9.6  9.5   Total Protein 6.0 - 8.5 g/dL 6.5     Total Bilirubin 0.0 - 1.2 mg/dL 0.4     Alkaline Phos 41 - 116 IU/L 80     AST 0 - 40 IU/L 13     ALT 0 - 32 IU/L 6        RADIOGRAPHIC STUDIES: I have personally reviewed the radiological images as listed and agreed with the findings in the  report. No results found.

## 2024-11-19 NOTE — Progress Notes (Signed)
 Patient states when her ferritin is low her symptoms are horrible and makes her bed bound.

## 2024-11-25 ENCOUNTER — Telehealth: Payer: Self-pay | Admitting: Oncology

## 2024-11-25 NOTE — Telephone Encounter (Signed)
 Pt called and requested estimate on copay for genetics appt - per estimate on encounter, $55 copay for this visit - [facility]

## 2024-12-02 ENCOUNTER — Encounter: Payer: Self-pay | Admitting: Licensed Clinical Social Worker

## 2024-12-02 ENCOUNTER — Other Ambulatory Visit: Payer: Self-pay | Admitting: Licensed Clinical Social Worker

## 2024-12-02 ENCOUNTER — Inpatient Hospital Stay: Attending: Oncology

## 2024-12-02 ENCOUNTER — Inpatient Hospital Stay: Attending: Oncology | Admitting: Licensed Clinical Social Worker

## 2024-12-02 DIAGNOSIS — Z1379 Encounter for other screening for genetic and chromosomal anomalies: Secondary | ICD-10-CM

## 2024-12-02 DIAGNOSIS — Z800 Family history of malignant neoplasm of digestive organs: Secondary | ICD-10-CM

## 2024-12-02 LAB — GENETIC SCREENING ORDER

## 2024-12-02 NOTE — Progress Notes (Signed)
 REFERRING PROVIDER: Babara Call, MD [address] [place],  [place] [address]  PRIMARY PROVIDER:  Rilla Baller, MD  PRIMARY REASON FOR VISIT:  1. Family history of colon cancer      HISTORY OF PRESENT ILLNESS:   Caroline [name], a 33 y.o. female, was seen for a  cancer genetics consultation at the request of Dr. Babara due to a family history of colon cancer.  Caroline [name] presents to clinic today to discuss the possibility of a hereditary predisposition to cancer, genetic testing, and to further clarify her future cancer risks, as well as potential cancer risks for family members.   CANCER HISTORY:  Caroline [name] is a 33 y.o. female with no personal history of cancer.    RELEVANT MEDICAL HISTORY:  Menarche was at age 53.  Ovaries intact: yes.  Hysterectomy: no.  Menopausal status: premenopausal.  Colonoscopy: yes; first at 18, might be having another soon. Mammogram within the last year: n/a. Number of breast biopsies: 0.  Past Medical History:  Diagnosis Date   Allergy     Anemia    Not sure, ferritin was low before   Chronic fatigue 06/26/2018   Colonic inertia 2007   Dr Joline ([facility])   CONSTIPATION, CHRONIC 05/30/2010   H/o colonic inertia   COVID-19 virus infection 10/24/2022   Dysplastic nevus 11/19/2019   Left upper abdomen. Mild atypia.   Dysplastic nevus 07/07/2024   right posterior base of neck - moderate   Dysplastic nevus 07/07/2024   left mid back paraspinal at braline moderate   High blood copper  level 10/07/2021   Unclear cause.   She had home water tested with normal results.   Ceruloplasmin when checked was not low.   24 hour urine copper  levels were normal.   Managing with zinc  and low copper  diet.   Saw heme - ?GI issue, planned GI f/u.    History of chicken pox    Hives 08/16/2021   Urticaria to OxyCide cleaner   Orthostatic intolerance 06/20/2019   Paroxysmal tachycardia (HCC) 05/12/2021   S/p cardiac eval (Dr Jordan, Dr Perla)    Seasonal allergic rhinitis    Seizures (HCC)    A long time ago. None since 2007   White matter abnormality on MRI of brain 01/31/2016   Small T2/FLAIR hyperintense focus in the deep white matter of the right frontal lobe, unchanged in appearance when compared to the 12/14/2015 MRI. This is a nonspecific finding and is unlikely to be of clinical significance in isolation. 2mm (11/2016)    Past Surgical History:  Procedure Laterality Date   COLONOSCOPY  2010   WNL [place])   EXCISION MORTON'S NEUROMA Left 2007   IR 3D INDEPENDENT WKST  08/03/2023   IR ANGIO INTRA EXTRACRAN SEL COM CAROTID INNOMINATE BILAT MOD SED  08/03/2023   IR ANGIO VERTEBRAL SEL VERTEBRAL BILAT MOD SED  08/03/2023   IR RADIOLOGIST EVAL & MGMT  07/02/2023   IR RADIOLOGIST EVAL & MGMT  09/10/2023   IR RADIOLOGIST EVAL & MGMT  10/04/2023   IR US  GUIDE VASC ACCESS RIGHT  08/03/2023   WISDOM TOOTH EXTRACTION  2010    FAMILY HISTORY:  We obtained a detailed, 4-generation family history.  Significant diagnoses are listed below: Family History  Problem Relation Age of Onset   Hypothyroidism Mother    Hypertension Mother    Heart disease Mother    Miscarriages / Stillbirths Mother    Melanoma Mother    Liver disease Father  Colon cancer Father 56   Hypertension Father    Cirrhosis Father    COPD Father    Atrial fibrillation Father    Diabetes Father    Alcohol abuse Father    Early death Father    Heart disease Father    Stroke Maternal Aunt    Cerebral aneurysm Maternal Aunt    AAA (abdominal aortic aneurysm) Maternal Aunt        smoker   Hypertension Maternal Grandmother    Heart failure Maternal Grandmother    Dementia Maternal Grandmother    Stroke Maternal Grandmother    Arthritis Maternal Grandmother    Heart disease Maternal Grandmother    Hypertension Paternal Grandmother    Colon cancer Paternal Grandmother 31    Ms. Wetherell is unaware of previous family history of genetic testing for hereditary  cancer risks. There is no reported Ashkenazi Jewish ancestry. There is no known consanguinity.  GENETIC COUNSELING ASSESSMENT: Caroline [name] is a 33 y.o. female with a family history of colon cancer which is somewhat suggestive of a hereditary cancer syndrome and predisposition to cancer. We, therefore, discussed and recommended the following at today's visit.   DISCUSSION: We discussed that, in general, most cancer is not inherited in families, but instead is sporadic or familial. Sporadic cancers occur by chance and typically happen at older ages (>50 years). We discussed that approximately 10% of colon cancer is hereditary. Most cases of hereditary colon cancer are associated with Lynch syndrome genes, although there are other genes associated with hereditary cancer as well. Cancers and risks are gene specific. We discussed that testing is beneficial for several reasons including knowing about cancer risks, identifying potential screening and risk-reduction options that may be appropriate, and to understand if other family members could be at risk for cancer and allow them to undergo genetic testing.   We reviewed the characteristics, features and inheritance patterns of hereditary cancer syndromes. We also discussed genetic testing, including the appropriate family members to test, the process of testing, insurance coverage and turn-around-time for results. We discussed the implications of a negative, positive and/or variant of uncertain significant result.   Based on Ms. Roh's family history of cancer, she does not quite meet medical criteria for genetic testing, however she could still pursue genetic testing for a self-pay price of $249 through [facility]'s CancerNext-Expanded+RNA Panel.  The CancerNext-Expanded gene panel offered by [facility] and includes sequencing, rearrangement, and RNA analysis for the following 77 genes: AIP, ALK, APC, ATM, AXIN2, BAP1, BARD1, BMPR1A, BRCA1, BRCA2, BRIP1,  CDC73, CDH1, CDK4, CDKN1B, CDKN2A, CEBPA, CHEK2, CTNNA1, DDX41, DICER1, ETV6, FH, FLCN, GATA2, LZTR1, MAX, MBD4, MEN1, MET, MLH1, MSH2, MSH3, MSH6, MUTYH, NF1, NF2, NTHL1, PALB2, PHOX2B, PMS2, POT1, PRKAR1A, PTCH1, PTEN, RAD51C, RAD51D, RB1, RET, RPS20, RUNX1, SDHA, SDHAF2, SDHB, SDHC, SDHD, SMAD4, SMARCA4, SMARCB1, SMARCE1, STK11, SUFU, TMEM127, TP53, TSC1, TSC2, VHL, and WT1 (sequencing and deletion/duplication); EGFR, HOXB13, KIT, MITF, PDGFRA, POLD1, and POLE (sequencing only); EPCAM and GREM1 (deletion/duplication only).  We discussed that some people do not want to undergo genetic testing due to fear of genetic discrimination.  A federal law called the Genetic Information Non-Discrimination Act ([place]) of 2008 helps protect individuals against genetic discrimination based on their genetic test results.  It impacts both health insurance and employment.  For health insurance, it protects against increased premiums, being kicked off insurance or being forced to take a test in order to be insured.  For employment it protects against hiring, firing and promoting  decisions based on genetic test results.  Health status due to a cancer diagnosis is not protected under [place].  This law does not protect life insurance, disability insurance, or other types of insurance.   PLAN: After considering the risks, benefits, and limitations, Ms. Linhardt provided informed consent to pursue genetic testing and the blood sample was sent to [facility] for analysis of the CancerNext-Expanded+RNA Panel. Results should be available within approximately 2-3 weeks' time, at which point they will be disclosed by telephone to Ms. Milazzo, as will any additional recommendations warranted by these results. Ms. Nadeau will receive a summary of her genetic counseling visit and a copy of her results once available. This information will also be available in Epic.   Ms. Searles questions were answered to her satisfaction today. Our  contact information was provided should additional questions or concerns arise. Thank you for the referral and allowing us  to share in the care of your patient.   Dena Cary, MS, [facility] Genetic Counselor [email or web address] Phone: [phone]  I personally spent a total of 50 minutes in the care of the patient today including counseling and educating, placing orders, and documenting clinical information in the EHR. Dr. Delinda was available for discussion regarding this case.   _______________________________________________________________________ For Office Staff:  Number of people involved in session: 2 - patient's sister Leita was present.  Was an Intern/ student involved with case: no

## 2024-12-08 DIAGNOSIS — R2689 Other abnormalities of gait and mobility: Secondary | ICD-10-CM | POA: Diagnosis not present

## 2024-12-08 DIAGNOSIS — R269 Unspecified abnormalities of gait and mobility: Secondary | ICD-10-CM | POA: Diagnosis not present

## 2024-12-08 DIAGNOSIS — F419 Anxiety disorder, unspecified: Secondary | ICD-10-CM | POA: Diagnosis not present

## 2024-12-08 DIAGNOSIS — R55 Syncope and collapse: Secondary | ICD-10-CM | POA: Diagnosis not present

## 2024-12-08 DIAGNOSIS — K5900 Constipation, unspecified: Secondary | ICD-10-CM | POA: Diagnosis not present

## 2024-12-08 DIAGNOSIS — R5382 Chronic fatigue, unspecified: Secondary | ICD-10-CM | POA: Diagnosis not present

## 2024-12-08 DIAGNOSIS — N83209 Unspecified ovarian cyst, unspecified side: Secondary | ICD-10-CM | POA: Diagnosis not present

## 2024-12-08 DIAGNOSIS — R259 Unspecified abnormal involuntary movements: Secondary | ICD-10-CM | POA: Diagnosis not present

## 2024-12-11 ENCOUNTER — Ambulatory Visit: Payer: Self-pay | Admitting: Licensed Clinical Social Worker

## 2024-12-11 ENCOUNTER — Telehealth: Payer: Self-pay | Admitting: Licensed Clinical Social Worker

## 2024-12-11 ENCOUNTER — Telehealth: Admitting: Physician Assistant

## 2024-12-11 ENCOUNTER — Encounter: Payer: Self-pay | Admitting: Licensed Clinical Social Worker

## 2024-12-11 DIAGNOSIS — Z1379 Encounter for other screening for genetic and chromosomal anomalies: Secondary | ICD-10-CM | POA: Insufficient documentation

## 2024-12-11 DIAGNOSIS — B9689 Other specified bacterial agents as the cause of diseases classified elsewhere: Secondary | ICD-10-CM | POA: Diagnosis not present

## 2024-12-11 DIAGNOSIS — J019 Acute sinusitis, unspecified: Secondary | ICD-10-CM

## 2024-12-11 MED ORDER — CEFDINIR 300 MG PO CAPS: 300.0000 mg | ORAL_CAPSULE | Freq: Two times a day (BID) | ORAL | 0 refills | Status: DC

## 2024-12-11 MED ORDER — FLUCONAZOLE 150 MG PO TABS: 150.0000 mg | ORAL_TABLET | ORAL | 0 refills | Status: DC | PRN

## 2024-12-11 NOTE — Patient Instructions (Addendum)
 Caroline [name], thank you for joining Caroline Velma Lunger, PA-C for today's virtual visit.  While this provider is not your primary care provider (PCP), if your PCP is located in our provider database this encounter information will be shared with them immediately following your visit.   A Caroline [name] account gives you access to today's visit and all your visits, tests, and labs performed at [facility]  click here if you don't have a [facility] [name] account or go to [name]  Consent: (Patient) Caroline [name] provided verbal consent for this virtual visit at the beginning of the encounter.  Current Medications:  Current Outpatient Medications:    azelastine (ASTELIN) 0.1 % nasal spray, Spray 2 sprays twice a day by intranasal route., Disp: , Rfl:    cetirizine (ZYRTEC) 10 MG tablet, Take 10 mg by mouth daily as needed for allergies., Disp: , Rfl:    Cholecalciferol  (VITAMIN D3) 25 MCG (1000 UT) CAPS, Take 1 capsule by mouth daily., Disp: , Rfl:    diphenhydrAMINE  (BENADRYL ) 25 mg capsule, Take 50 mg by mouth every 6 (six) hours as needed for allergies or itching., Disp: , Rfl:    fexofenadine (ALLEGRA) 180 MG tablet, Take by mouth., Disp: , Rfl:    fluticasone (FLONASE) 50 MCG/ACT nasal spray, Place into both nostrils daily., Disp: , Rfl:    ibuprofen (ADVIL) 200 MG tablet, Take by mouth., Disp: , Rfl:    LORazepam  (ATIVAN ) 0.5 MG tablet, TAKE 1/2 TO 1 TABLET(0.25 TO 0.5 MG) BY MOUTH AT BEDTIME AS NEEDED FOR ANXIETY OR SLEEP, Disp: 20 tablet, Rfl: 0   metoprolol  tartrate (LOPRESSOR ) 25 MG tablet, Take 0.5 tablet (12.5 mg) by mouth twice daily as needed for tachycardia (Patient not taking: Reported on 11/19/2024), Disp: 60 tablet, Rfl: 3   Multiple Vitamin (MULTIVITAMIN ADULT) TABS, Take 1 tablet by mouth daily. Nature Made MVI for Her, Disp: , Rfl:    propranolol  (INDERAL ) 10 MG tablet, , Disp: , Rfl:    Prucalopride Succinate  (MOTEGRITY ) 1 MG TABS,  Take 1 tablet (1 mg total) by mouth daily., Disp: 90 tablet, Rfl: 0   Prucalopride Succinate  1 MG TABS, Take by mouth., Disp: , Rfl:    Medications ordered in this encounter:  No orders of the defined types were placed in this encounter.    *If you need refills on other medications prior to your next appointment, please contact your pharmacy*  Follow-Up: Call back or seek an in-person evaluation if the symptoms worsen or if the condition fails to improve as anticipated.  [facility] [phone]  Other Instructions Please take antibiotic as directed.  Increase fluid intake.  Use Saline nasal spray.  Take a daily multivitamin. Please continue your routine allergy  medications.  Place a humidifier in the bedroom.  Please call or return clinic if symptoms are not improving.  Sinusitis Sinusitis is redness, soreness, and swelling (inflammation) of the paranasal sinuses. Paranasal sinuses are air pockets within the bones of your face (beneath the eyes, the middle of the forehead, or above the eyes). In healthy paranasal sinuses, mucus is able to drain out, and air is able to circulate through them by way of your nose. However, when your paranasal sinuses are inflamed, mucus and air can become trapped. This can allow bacteria and other germs to grow and cause infection. Sinusitis can develop quickly and last only a short time (acute) or continue over a long period (chronic). Sinusitis that lasts for more  than 12 weeks is considered chronic.  CAUSES  Causes of sinusitis include: Allergies. Structural abnormalities, such as displacement of the cartilage that separates your nostrils (deviated septum), which can decrease the air flow through your nose and sinuses and affect sinus drainage. Functional abnormalities, such as when the small hairs (cilia) that line your sinuses and help remove mucus do not work properly or are not present. SYMPTOMS  Symptoms of acute and chronic sinusitis  are the same. The primary symptoms are pain and pressure around the affected sinuses. Other symptoms include: Upper toothache. Earache. Headache. Bad breath. Decreased sense of smell and taste. A cough, which worsens when you are lying flat. Fatigue. Fever. Thick drainage from your nose, which often is green and may contain pus (purulent). Swelling and warmth over the affected sinuses. DIAGNOSIS  Your caregiver will perform a physical exam. During the exam, your caregiver may: Look in your nose for signs of abnormal growths in your nostrils (nasal polyps). Tap over the affected sinus to check for signs of infection. View the inside of your sinuses (endoscopy) with a special imaging device with a light attached (endoscope), which is inserted into your sinuses. If your caregiver suspects that you have chronic sinusitis, one or more of the following tests may be recommended: Allergy  tests. Nasal culture A sample of mucus is taken from your nose and sent to a lab and screened for bacteria. Nasal cytology A sample of mucus is taken from your nose and examined by your caregiver to determine if your sinusitis is related to an allergy . TREATMENT  Most cases of acute sinusitis are related to a viral infection and will resolve on their own within 10 days. Sometimes medicines are prescribed to help relieve symptoms (pain medicine, decongestants, nasal steroid sprays, or saline sprays).  However, for sinusitis related to a bacterial infection, your caregiver will prescribe antibiotic medicines. These are medicines that will help kill the bacteria causing the infection.  Rarely, sinusitis is caused by a fungal infection. In theses cases, your caregiver will prescribe antifungal medicine. For some cases of chronic sinusitis, surgery is needed. Generally, these are cases in which sinusitis recurs more than 3 times per year, despite other treatments. HOME CARE INSTRUCTIONS  Drink plenty of water. Water  helps thin the mucus so your sinuses can drain more easily. Use a humidifier. Inhale steam 3 to 4 times a day (for example, sit in the bathroom with the shower running). Apply a warm, moist washcloth to your face 3 to 4 times a day, or as directed by your caregiver. Use saline nasal sprays to help moisten and clean your sinuses. Take over-the-counter or prescription medicines for pain, discomfort, or fever only as directed by your caregiver. SEEK IMMEDIATE MEDICAL CARE IF: You have increasing pain or severe headaches. You have nausea, vomiting, or drowsiness. You have swelling around your face. You have vision problems. You have a stiff neck. You have difficulty breathing. MAKE SURE YOU:  Understand these instructions. Will watch your condition. Will get help right away if you are not doing well or get worse. Document Released: 12/11/2005 Document Revised: 03/04/2012 Document Reviewed: 12/26/2011 [facility] Patient Information 2014 [place], [place].    If you have been instructed to have an in-person evaluation today at a local Urgent Care facility, please use the link below. It will take you to a list of all of our available [facility], including address, phone number and hours of operation. Please do not delay care.  [facility]  If you or a family member do not have a primary care provider, use the link below to schedule a visit and establish care. When you choose a [facility] primary care physician or advanced practice provider, you gain a long-term partner in health. Find a Primary Care Provider  Learn more about [facility]'s in-office and virtual care options: [facility] - Get Care Now

## 2024-12-11 NOTE — Progress Notes (Signed)
 Virtual Visit Consent   Caroline [name], you are scheduled for a virtual visit with a [facility] provider today. Just as with appointments in the office, your consent must be obtained to participate. Your consent will be active for this visit and any virtual visit you may have with one of our providers in the next 365 days. If you have a MyChart account, a copy of this consent can be sent to you electronically.  As this is a virtual visit, video technology does not allow for your provider to perform a traditional examination. This may limit your provider's ability to fully assess your condition. If your provider identifies any concerns that need to be evaluated in person or the need to arrange testing (such as labs, EKG, etc.), we will make arrangements to do so. Although advances in technology are sophisticated, we cannot ensure that it will always work on either your end or our end. If the connection with a video visit is poor, the visit may have to be switched to a telephone visit. With either a video or telephone visit, we are not always able to ensure that we have a secure connection.  By engaging in this virtual visit, you consent to the provision of healthcare and authorize for your insurance to be billed (if applicable) for the services provided during this visit. Depending on your insurance coverage, you may receive a charge related to this service.  I need to obtain your verbal consent now. Are you willing to proceed with your visit today? Caroline [name] has provided verbal consent on 12/11/2024 for a virtual visit (video or telephone). Caroline [name], [place]  Date: 12/11/2024 4:23 PM   Virtual Visit via Video Note   I, Caroline [name], connected with  MARKELA WEE  ([id number], December 01, 1991) on 12/11/2024 at  4:15 PM EST by a video-enabled telemedicine application and verified that I am speaking with the correct person using two identifiers.  Location: Patient: Virtual Visit Location  Patient: Home Provider: Virtual Visit Location Provider: Home Office   I discussed the limitations of evaluation and management by telemedicine and the availability of in person appointments. The patient expressed understanding and agreed to proceed.    History of Present Illness: Caroline [name] is a 33 y.o. who identifies as a female who was assigned female at birth, and is being seen today for possible sinus infection.   Thanks -- fatigue. Though midl viral illness -- nasal congestion.  Initial improvement with supportive measures, Tylenol , Ibuprofen  Worsened over past week -- chills, nasal congestion, sinus pressure and sinus pain.  Denies fever, chills. Sinus headache. Episode of epistaxis.    HPI: HPI  Problems:  Patient Active Problem List   Diagnosis Date Noted   Genetic testing 12/11/2024   History of iron  deficiency anemia 11/19/2024   Family history of colon cancer 11/19/2024   Iron  deficiency anemia, unspecified 09/23/2024   Pulse visible in abdominal aorta 07/05/2024   Vestibular disorder, left 04/23/2024   Dizziness 01/12/2024   Recurrent sinus infections 01/12/2024   Health maintenance examination 01/11/2024   OAB (overactive bladder) 10/08/2023   Abnormality of basilar artery [place] 08/09/2023   Weight gain 02/13/2023   Vision changes 11/18/2022   Leg weakness, bilateral 11/18/2022   Post covid-19 condition, unspecified 11/18/2022   Chronic back pain 10/24/2022   Mild reactive airways disease 09/28/2022   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   Iron  deficiency 07/19/2022   Allergy  status to other drugs, medicaments and  biological substances 06/24/2022   Hypertriglyceridemia 03/02/2022   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   Vitamin B12 deficiency 10/07/2021   Paroxysmal tachycardia (HCC) 05/12/2021   Ovarian cyst 01/07/2021   Insomnia 10/20/2020   Paresthesias 09/25/2020   Vasovagal near-syncope 09/17/2019   Orthostatic intolerance 06/20/2019   Other fatigue  06/26/2018   Seasonal allergic rhinitis    Pineal gland cyst 01/31/2016   White matter abnormality on MRI of brain 01/31/2016   Vitamin D  deficiency 05/30/2010   CONSTIPATION, CHRONIC 05/30/2010   Colonic inertia 12/25/2005    Allergies: Allergies[1] Medications: Current Medications[2]  Observations/Objective: Patient is well-developed, well-nourished in no acute distress.  Resting comfortably  at home.  Head is normocephalic, atraumatic.  No labored breathing.  Speech is clear and coherent with logical content.  Patient is alert and oriented at baseline.   Assessment and Plan: 1. Acute bacterial sinusitis (Primary) - cefdinir  (OMNICEF ) 300 MG capsule; Take 1 capsule (300 mg total) by mouth 2 (two) times daily.  Dispense: 20 capsule; Refill: 0 - fluconazole  (DIFLUCAN ) 150 MG tablet; Take 1 tablet (150 mg total) by mouth every 3 (three) days as needed.  Dispense: 3 tablet; Refill: 0  Rx Cefdinir .  Increase fluids.  Rest.  Saline nasal spray.  Probiotic.  Mucinex as directed.  Humidifier in bedroom. Continue routine allergy  medications.  Call or return to clinic if symptoms are not improving.   Follow Up Instructions: I discussed the assessment and treatment plan with the patient. The patient was provided an opportunity to ask questions and all were answered. The patient agreed with the plan and demonstrated an understanding of the instructions.  A copy of instructions were sent to the patient via MyChart unless otherwise noted below.   The patient was advised to call back or seek an in-person evaluation if the symptoms worsen or if the condition fails to improve as anticipated.    Caroline Velma Lunger, PA-C    [1]  Allergies Allergen Reactions   Amoxicillin  Other (See Comments)    Does not tolerate larger doses of amoxicillin , tolerates 500mg  TID well   Doxycycline Nausea And Vomiting   Linzess [Linaclotide] Other (See Comments)    Bloating, abd pain   Neomycin     Tested  weakly positive by patch test   Nickel Itching   Scopolamine Other (See Comments)   Other Rash    Oxycide - cleaner used at hospital. Caused rash all over.   [2]  Current Outpatient Medications:    cefdinir  (OMNICEF ) 300 MG capsule, Take 1 capsule (300 mg total) by mouth 2 (two) times daily., Disp: 20 capsule, Rfl: 0   azelastine (ASTELIN) 0.1 % nasal spray, Spray 2 sprays twice a day by intranasal route., Disp: , Rfl:    cetirizine (ZYRTEC) 10 MG tablet, Take 10 mg by mouth daily as needed for allergies., Disp: , Rfl:    Cholecalciferol  (VITAMIN D3) 25 MCG (1000 UT) CAPS, Take 1 capsule by mouth daily., Disp: , Rfl:    diphenhydrAMINE  (BENADRYL ) 25 mg capsule, Take 50 mg by mouth every 6 (six) hours as needed for allergies or itching., Disp: , Rfl:    fexofenadine (ALLEGRA) 180 MG tablet, Take by mouth., Disp: , Rfl:    fluconazole  (DIFLUCAN ) 150 MG tablet, Take 1 tablet (150 mg total) by mouth every 3 (three) days as needed., Disp: 3 tablet, Rfl: 0   fluticasone (FLONASE) 50 MCG/ACT nasal spray, Place into both nostrils daily., Disp: , Rfl:    ibuprofen (ADVIL) 200 MG  tablet, Take by mouth., Disp: , Rfl:    LORazepam  (ATIVAN ) 0.5 MG tablet, TAKE 1/2 TO 1 TABLET(0.25 TO 0.5 MG) BY MOUTH AT BEDTIME AS NEEDED FOR ANXIETY OR SLEEP, Disp: 20 tablet, Rfl: 0   metoprolol  tartrate (LOPRESSOR ) 25 MG tablet, Take 0.5 tablet (12.5 mg) by mouth twice daily as needed for tachycardia (Patient not taking: Reported on 11/19/2024), Disp: 60 tablet, Rfl: 3   Multiple Vitamin (MULTIVITAMIN ADULT) TABS, Take 1 tablet by mouth daily. [employer] Made MVI for Her, Disp: , Rfl:    propranolol  (INDERAL ) 10 MG tablet, , Disp: , Rfl:    Prucalopride Succinate  (MOTEGRITY ) 1 MG TABS, Take 1 tablet (1 mg total) by mouth daily., Disp: 90 tablet, Rfl: 0   Prucalopride Succinate  1 MG TABS, Take by mouth., Disp: , Rfl:

## 2024-12-11 NOTE — Progress Notes (Signed)
 HPI:   Caroline [name] was previously seen in the [facility] due to a family history of cancer and concerns regarding a hereditary predisposition to cancer. Please refer to our prior cancer genetics clinic note for more information regarding our discussion, assessment and recommendations, at the time. Ms. Yore recent genetic test results were disclosed to her, as were recommendations warranted by these results. These results and recommendations are discussed in more detail below.  CANCER HISTORY:  Oncology History   No problem history exists.    FAMILY HISTORY:  We obtained a detailed, 4-generation family history.  Significant diagnoses are listed below: Family History  Problem Relation Age of Onset   Hypothyroidism Mother    Hypertension Mother    Heart disease Mother    Miscarriages / Stillbirths Mother    Melanoma Mother    Liver disease Father    Colon cancer Father 69   Hypertension Father    Cirrhosis Father    COPD Father    Atrial fibrillation Father    Diabetes Father    Alcohol abuse Father    Early death Father    Heart disease Father    Stroke Maternal Aunt    Cerebral aneurysm Maternal Aunt    AAA (abdominal aortic aneurysm) Maternal Aunt        smoker   Hypertension Maternal Grandmother    Heart failure Maternal Grandmother    Dementia Maternal Grandmother    Stroke Maternal Grandmother    Arthritis Maternal Grandmother    Heart disease Maternal Grandmother    Hypertension Paternal Grandmother    Colon cancer Paternal Grandmother 69      Ms. Carrozza is unaware of previous family history of genetic testing for hereditary cancer risks. There is no reported Ashkenazi Jewish ancestry. There is no known consanguinity.  GENETIC TEST RESULTS:  The [facility] CancerNext-Expanded+RNA Panel found no pathogenic mutations.   The CancerNext-Expanded gene panel offered by [facility] and includes sequencing, rearrangement, and RNA analysis for the  following 77 genes: AIP, ALK, APC, ATM, AXIN2, BAP1, BARD1, BMPR1A, BRCA1, BRCA2, BRIP1, CDC73, CDH1, CDK4, CDKN1B, CDKN2A, CEBPA, CHEK2, CTNNA1, DDX41, DICER1, ETV6, FH, FLCN, GATA2, LZTR1, MAX, MBD4, MEN1, MET, MLH1, MSH2, MSH3, MSH6, MUTYH, NF1, NF2, NTHL1, PALB2, PHOX2B, PMS2, POT1, PRKAR1A, PTCH1, PTEN, RAD51C, RAD51D, RB1, RET, RPS20, RUNX1, SDHA, SDHAF2, SDHB, SDHC, SDHD, SMAD4, SMARCA4, SMARCB1, SMARCE1, STK11, SUFU, TMEM127, TP53, TSC1, TSC2, VHL, and WT1 (sequencing and deletion/duplication); EGFR, HOXB13, KIT, MITF, PDGFRA, POLD1, and POLE (sequencing only); EPCAM and GREM1 (deletion/duplication only).   The test report has been scanned into EPIC and is located under the Molecular Pathology section of the Results Review tab.  A portion of the result report is included below for reference. Genetic testing reported out on 12/10/2024.     Genetic testing identified a variant of uncertain significance (VUS) in the RET gene called p.V202M.  At this time, it is unknown if this variant is associated with an increased risk for cancer or if it is benign, but most uncertain variants are reclassified to benign. It should not be used to make medical management decisions. With time, we suspect the laboratory will determine the significance of this variant, if any. If the laboratory reclassifies this variant, we will attempt to contact Ms. Patel to discuss it further.   Even though a pathogenic variant was not identified, possible explanations for the cancer in the family may include: There may be no hereditary risk for cancer in the family.  The cancers in Ms. Guttierrez's family may be sporadic/familial or due to other genetic and environmental factors. There may be a gene mutation in one of these genes that current testing methods cannot detect but that chance is small. There could be another gene that has not yet been discovered, or that we have not yet tested, that is responsible for the cancer diagnoses in  the family.  It is also possible there is a hereditary cause for the cancer in the family that Ms. Gowell did not inherit. Therefore, it is important to remain in touch with cancer genetics in the future so that we can continue to offer Ms. Morrell the most up to date genetic testing.   ADDITIONAL GENETIC TESTING:  We discussed with Ms. Bisig that her genetic testing was fairly extensive.  If there are additional relevant genes identified to increase cancer risk that can be analyzed in the future, we would be happy to discuss and coordinate this testing at that time.    CANCER SCREENING RECOMMENDATIONS:  Ms. Keys test result is considered negative (normal).  This means that we have not identified a hereditary cause for her family history of cancer at this time.   An individual's cancer risk and medical management are not determined by genetic test results alone. Overall cancer risk assessment incorporates additional factors, including personal medical history, family history, and any available genetic information that may result in a personalized plan for cancer prevention and surveillance. Therefore, it is recommended she continue to follow the cancer management and screening guidelines provided by her primary healthcare provider.  Colon Cancer Screening: Due to Ms. Helzer's family's history of colon cancer she is recommended to repeat colonoscopies at least every 5 years starting at 68 (if first degree relative) or 10 years before earliest colorectal cancer diagnosis. More frequent colonoscopies may be recommended if polyps are identified.  RECOMMENDATIONS FOR FAMILY MEMBERS:   Individuals in this family might be at some increased risk of developing cancer, over the general population risk, due to the family history of cancer.  Individuals in the family should notify their providers of the family history of cancer. We recommend women in this family have a yearly mammogram beginning at age 42,  or 21 years younger than the earliest onset of cancer, an annual clinical breast exam, and perform monthly breast self-exams.  Family members should have colonoscopies by at age 3, or earlier, as recommended by their providers. We do not recommend familial testing for the RET variant of uncertain significance (VUS).  FOLLOW-UP:  Lastly, we discussed with Ms. Treat that cancer genetics is a rapidly advancing field and it is possible that new genetic tests will be appropriate for her and/or her family members in the future. We encouraged her to remain in contact with cancer genetics on an annual basis so we can update her personal and family histories and let her know of advances in cancer genetics that may benefit this family.   Our contact number was provided. Ms. Spelman questions were answered to her satisfaction, and she knows she is welcome to call us  at anytime with additional questions or concerns.    Dena Cary, MS, [facility] Genetic Counselor [email or web address] Phone: [phone]

## 2024-12-11 NOTE — Telephone Encounter (Signed)
 I contacted Ms. Storie to discuss her genetic testing results. No pathogenic variants were identified in the 77 genes analyzed. Detailed clinic note to follow.   The test report has been scanned into EPIC and is located under the Molecular Pathology section of the Results Review tab.  A portion of the result report is included below for reference.      Dena Cary, MS, [facility] Genetic Counselor [email or web address] Phone: [phone]

## 2024-12-12 DIAGNOSIS — M5441 Lumbago with sciatica, right side: Secondary | ICD-10-CM | POA: Diagnosis not present

## 2024-12-12 DIAGNOSIS — M9904 Segmental and somatic dysfunction of sacral region: Secondary | ICD-10-CM | POA: Diagnosis not present

## 2024-12-12 DIAGNOSIS — M461 Sacroiliitis, not elsewhere classified: Secondary | ICD-10-CM | POA: Diagnosis not present

## 2024-12-12 DIAGNOSIS — M9903 Segmental and somatic dysfunction of lumbar region: Secondary | ICD-10-CM | POA: Diagnosis not present

## 2024-12-12 NOTE — Addendum Note (Signed)
 Addended by: DAYNE SHERRY RAMAN on: 12/12/2024 10:32 AM   Modules accepted: Orders

## 2024-12-13 ENCOUNTER — Other Ambulatory Visit: Payer: Self-pay | Admitting: Internal Medicine

## 2024-12-22 DIAGNOSIS — R55 Syncope and collapse: Secondary | ICD-10-CM | POA: Diagnosis not present

## 2024-12-22 DIAGNOSIS — R5381 Other malaise: Secondary | ICD-10-CM | POA: Diagnosis not present

## 2024-12-22 DIAGNOSIS — R000 Tachycardia, unspecified: Secondary | ICD-10-CM | POA: Diagnosis not present

## 2025-01-01 ENCOUNTER — Ambulatory Visit: Admitting: Internal Medicine

## 2025-01-01 ENCOUNTER — Encounter: Payer: Self-pay | Admitting: Internal Medicine

## 2025-01-01 VITALS — BP 110/76 | HR 100 | Ht 67.0 in | Wt 167.0 lb

## 2025-01-01 DIAGNOSIS — E611 Iron deficiency: Secondary | ICD-10-CM

## 2025-01-01 DIAGNOSIS — K5901 Slow transit constipation: Secondary | ICD-10-CM | POA: Diagnosis not present

## 2025-01-01 DIAGNOSIS — K599 Functional intestinal disorder, unspecified: Secondary | ICD-10-CM

## 2025-01-01 MED ORDER — NA SULFATE-K SULFATE-MG SULF 17.5-3.13-1.6 GM/177ML PO SOLN: 1.0000 | ORAL | 0 refills | Status: AC

## 2025-01-01 NOTE — Progress Notes (Signed)
 "       Caroline [name] 33 y.o. 1991-05-29 [id number]  Assessment & Plan:   Encounter Diagnoses  Name Primary?   Iron  deficiency Yes   Slow transit constipation    Colonic inertia      iron  deficiency anemia with unclear etiology, persistent despite adequate dietary intake and regular menses. Did previously have menorrhagia but not now.Family history of colon cancer adds to milieu. - Recommended colonoscopy and upper endoscopy to evaluate for gastrointestinal sources of blood loss.   Longstanding constipation with good response to prucalopride overall, with constipation occurring primarily around the week before her period. - Reviewed bowel regimen and medication efficacy, noting improvement with specific generic brands of prucalopride. - Determined no need for additional bowel preparation prior to colonoscopy due to adequate response to prucalopride.   Symptoms of bloating and postprandial discomfort with chronic constipation are suggestive of SIBO with increased methane production. Recent antibiotic use and upcoming colonoscopy require appropriate timing for breath testing. - Recommended hydrogen/methane breath test for SIBO after appropriate waiting period post-antibiotics and colonoscopy. - Provided education regarding breath test procedure, including fasting, lactulose administration, and home sample collection.      Latest Ref Rng & Units 11/19/2024    2:14 PM 09/10/2024    8:38 AM 07/22/2024    8:46 AM  CBC  WBC 4.0 - 10.5 K/uL 7.3  7.4  6.9   Hemoglobin 12.0 - 15.0 g/dL 86.5  87.3  87.2   Hematocrit 36.0 - 46.0 % 39.6  39.6  40.0   Platelets 150 - 400 K/uL 263  282  269    Lab Results  Component Value Date   FERRITIN 138 11/19/2024   Lab Results  Component Value Date   VITAMINB12 944 09/10/2024     Rr:Hlupzmmzs, Anton, MD Dr. Zelphia Cap     Subjective:   Chief Complaint: iron  deficiency anemia  HPI Discussed the use of AI scribe software for clinical note  transcription with the patient, who gave verbal consent to proceed.   Caroline [name] is a 34 year old female with iron  deficiency anemia and chronic constipation who presents for evaluation of persistent iron  deficiency.  Iron  deficiency anemia - Persistent iron  deficiency requiring multiple iron  infusions since May 2025 after significant decline in wellbeing. - Initial iron  infusion resulted in marked improvement; ferritin increased to 87 in July 2025. - Ferritin decreased by 29% by September 2025, prompting another infusion in October; ferritin subsequently increased to 138 in November. - No overt gastrointestinal bleeding. - No stool testing for occult blood performed. - Menses regular, with only one heavy day per cycle and no recent increase in menstrual bleeding. - No history of blood donation. - Red meat consumed daily. - Celiac disease testing negative. - Genetic testing revealed RET abnormality of unknown significance; all other results negative or inconclusive.  Chronic constipation and bowel dysfunction - Chronic constipation managed with daily prucalopride. - Bowel movements typically daily, but constipation worsens around menses, sometimes resulting in up to three days without a bowel movement. - Recent switch to generic prucalopride associated with delayed bowel movements and increased gastrointestinal discomfort. - Significant bloating and sensation of feeling gross for hours after eating. - Heart rate spikes occurring hours before bowel movements.  Autonomic symptoms - Symptomatic tachycardia with dizziness, lightheadedness, vision changes, and episodes of hypertension and hypotension. - Propranolol  used for heart rate control.  Gastroesophageal irritation - Recent episode of pills getting stuck in throat caused transient esophageal irritation,  improved with warm water. - Continues Protonix with mild residual symptoms.  Suspected small intestinal bacterial  overgrowth - Recent antibiotic treatment; specific indication and agent not discussed. - No prior breath testing for small intestinal bacterial overgrowth.  Family history of gastrointestinal malignancy - Father diagnosed with stage IV colon cancer at age 48. - Paternal grandmother died of a rare stomach cancer in her fifties. - No prior colonoscopy or endoscopy.     She has a hx of tachycardia and presyncope issues followed by [facility] cardiology. NL EF  Allergies[1] Active Medications[2] Past Medical History:  Diagnosis Date   Allergy     Anemia    Not sure, ferritin was low before   Chronic fatigue 06/26/2018   Colonic inertia 2007   Dr Joline ([facility])   CONSTIPATION, CHRONIC 05/30/2010   H/o colonic inertia   COVID-19 virus infection 10/24/2022   Dysplastic nevus 11/19/2019   Left upper abdomen. Mild atypia.   Dysplastic nevus 07/07/2024   right posterior base of neck - moderate   Dysplastic nevus 07/07/2024   left mid back paraspinal at braline moderate   High blood copper  level 10/07/2021   Unclear cause.   She had home water tested with normal results.   Ceruloplasmin when checked was not low.   24 hour urine copper  levels were normal.   Managing with zinc  and low copper  diet.   Saw heme - ?GI issue, planned GI f/u.    History of chicken pox    Hives 08/16/2021   Urticaria to OxyCide cleaner   Orthostatic intolerance 06/20/2019   Paroxysmal tachycardia (HCC) 05/12/2021   S/p cardiac eval (Dr Jordan, Dr Perla)   Seasonal allergic rhinitis    Seizures (HCC)    A long time ago. None since 2007   White matter abnormality on MRI of brain 01/31/2016   Small T2/FLAIR hyperintense focus in the deep white matter of the right frontal lobe, unchanged in appearance when compared to the 12/14/2015 MRI. This is a nonspecific finding and is unlikely to be of clinical significance in isolation. 2mm (11/2016)   Past Surgical History:  Procedure Laterality Date   COLONOSCOPY  2010    [facility] [place])   EXCISION MORTON'S NEUROMA Left 2007   IR 3D INDEPENDENT WKST  08/03/2023   IR ANGIO INTRA EXTRACRAN SEL COM CAROTID INNOMINATE BILAT MOD SED  08/03/2023   IR ANGIO VERTEBRAL SEL VERTEBRAL BILAT MOD SED  08/03/2023   IR RADIOLOGIST EVAL & MGMT  07/02/2023   IR RADIOLOGIST EVAL & MGMT  09/10/2023   IR RADIOLOGIST EVAL & MGMT  10/04/2023   IR US  GUIDE VASC ACCESS RIGHT  08/03/2023   WISDOM TOOTH EXTRACTION  2010   Social History   Social History Narrative   Caffeine use: 1 cup coffee/day and some tea   Lives at home with mother, father, and sister and pets (horses, ducks, programme researcher, broadcasting/film/video, fish, wild turkey)   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, 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   Activity: Walking   Diet: good water, fruits/vegetables daily      family history includes AAA (abdominal aortic aneurysm) in her maternal aunt; Alcohol abuse in her father; Arthritis in her maternal grandmother; Atrial fibrillation in her father; COPD in her father; Cerebral aneurysm in her maternal aunt; Cirrhosis in her father; Colon cancer (age of onset: 17) in her paternal grandmother; Colon cancer (age of onset: 85) in  her father; Dementia in her maternal grandmother; Diabetes in her father; Early death in her father; Heart disease in her father, maternal grandmother, and mother; Heart failure in her maternal grandmother; Hypertension in her father, maternal grandmother, mother, and paternal grandmother; Hypothyroidism in her mother; Liver disease in her father; Melanoma in her mother; Miscarriages / Stillbirths in her mother; Stroke in her maternal aunt and maternal grandmother.   Review of Systems  As above/HPI  Objective:   Physical Exam @BP  110/76   Pulse 100   Ht 5' 7 (1.702 m)   Wt 167 lb (75.8 kg)   LMP 12/16/2024   SpO2 99%   BMI 26.16 kg/m @  General:  NAD Eyes:   anicteric Lungs:  clear Heart::  S1S2 no  rubs, murmurs or gallops Abdomen:  soft and nontender, BS+ Ext:   no edema, cyanosis or clubbing    Data Reviewed:  As per HPI    [1]  Allergies Allergen Reactions   Amoxicillin  Other (See Comments)    Does not tolerate larger doses of amoxicillin , tolerates 500mg  TID well   Doxycycline Nausea And Vomiting   Linzess [Linaclotide] Other (See Comments)    Bloating, abd pain   Neomycin     Tested weakly positive by patch test   Nickel Itching   Scopolamine Other (See Comments)   Other Rash    Oxycide - cleaner used at hospital. Caused rash all over.   [2]  Current Meds  Medication Sig   azelastine (ASTELIN) 0.1 % nasal spray Spray 2 sprays twice a day by intranasal route.   cetirizine (ZYRTEC) 10 MG tablet Take 10 mg by mouth daily as needed for allergies.   Cholecalciferol  (VITAMIN D3) 25 MCG (1000 UT) CAPS Take 1 capsule by mouth daily.   diphenhydrAMINE  (BENADRYL ) 25 mg capsule Take 50 mg by mouth every 6 (six) hours as needed for allergies or itching.   fexofenadine (ALLEGRA) 180 MG tablet Take by mouth.   fluticasone (FLONASE) 50 MCG/ACT nasal spray Place into both nostrils daily.   ibuprofen (ADVIL) 200 MG tablet Take by mouth.   LORazepam  (ATIVAN ) 0.5 MG tablet TAKE 1/2 TO 1 TABLET(0.25 TO 0.5 MG) BY MOUTH AT BEDTIME AS NEEDED FOR ANXIETY OR SLEEP   Multiple Vitamin (MULTIVITAMIN ADULT) TABS Take 1 tablet by mouth daily. Nature Made MVI for Her   Na Sulfate-K Sulfate-Mg Sulfate concentrate (SUPREP) 17.5-3.13-1.6 GM/177ML SOLN Take 1 kit (354 mLs total) by mouth as directed.   propranolol  (INDERAL ) 10 MG tablet    Prucalopride Succinate  1 MG TABS TAKE 1 TABLET(1 MG) BY MOUTH DAILY   "

## 2025-01-01 NOTE — Patient Instructions (Signed)
 You have been scheduled for an endoscopy and colonoscopy. Please follow the written instructions given to you at your visit today.  If you use inhalers (even only as needed), please bring them with you on the day of your procedure.  ___________________________________________________________________________  You have been given a testing kit to check for small intestine bacterial overgrowth (SIBO) which is completed by a company named [facility]. Make sure to return your test in the mail using the return mailing label given to you along with the kit. The test order, your demographic and insurance information have all already been sent to the company. [facility] will collect an upfront charge of $109.00 for commercial insurance plans and $229.00 if you are paying cash. The potential remaining total after claim submission and review is $120.00. Make sure to discuss with [facility] PRIOR to having the test to see if they have gotten information from your insurance company as to how much your testing will cost out of pocket, if any. Please contact [facility] at phone number [phone] to get instructions regarding how to perform the test as our office is unable to give specific testing instructions.   I appreciate the opportunity to care for you. Lupita Commander, MD, [facility], [facility]

## 2025-01-08 ENCOUNTER — Other Ambulatory Visit: Payer: Self-pay | Admitting: Internal Medicine

## 2025-01-29 ENCOUNTER — Encounter: Admitting: Internal Medicine

## 2025-01-30 ENCOUNTER — Telehealth: Payer: Self-pay | Admitting: Internal Medicine

## 2025-01-30 NOTE — Telephone Encounter (Signed)
 Spoke with patient.  She understands instructions.  States she may have difficulty being off of the Motegrity  for one week, however, she will try.

## 2025-01-30 NOTE — Telephone Encounter (Signed)
 She raises a good point - I think she should stop the Motegrity  x 1 week before test.  Thank her for asking me, please.

## 2025-01-30 NOTE — Telephone Encounter (Signed)
 Spoke with patient concerning SIBO test.  She is inquiring as to whether she needs to hold her Motegrity  prior to starting the test.  The pamphlet inside of the test kit states to discontinue laxatives and/or promotility drugs one week before the test, however, the website doesn't mention it.  Can you advise?  Thank you

## 2025-01-30 NOTE — Telephone Encounter (Signed)
 Incoming call from pt regarding sibo lactulose test. Pt stated she has questions about the instructions and medication recommendation Pt also asked about getting a clinical section of paperwork filled out.SABRA Please advise. Thank you .

## 2025-02-05 ENCOUNTER — Encounter: Admitting: Internal Medicine

## 2025-02-20 ENCOUNTER — Inpatient Hospital Stay

## 2025-02-26 ENCOUNTER — Inpatient Hospital Stay

## 2025-02-26 ENCOUNTER — Inpatient Hospital Stay: Admitting: Oncology

## 2025-07-09 ENCOUNTER — Ambulatory Visit: Admitting: Dermatology
